# Patient Record
Sex: Male | Born: 1950 | Race: White | Hispanic: No | Smoking: Never smoker
Health system: Southern US, Community
[De-identification: ages and names within clinical notes are randomized; demographics above are authoritative.]

## PROBLEM LIST (undated history)

## (undated) DIAGNOSIS — E785 Hyperlipidemia, unspecified: Secondary | ICD-10-CM

## (undated) DIAGNOSIS — Z8701 Personal history of pneumonia (recurrent): Secondary | ICD-10-CM

## (undated) DIAGNOSIS — I2510 Atherosclerotic heart disease of native coronary artery without angina pectoris: Secondary | ICD-10-CM

## (undated) DIAGNOSIS — E119 Type 2 diabetes mellitus without complications: Secondary | ICD-10-CM

## (undated) DIAGNOSIS — G4730 Sleep apnea, unspecified: Secondary | ICD-10-CM

## (undated) DIAGNOSIS — I10 Essential (primary) hypertension: Secondary | ICD-10-CM

## (undated) DIAGNOSIS — I639 Cerebral infarction, unspecified: Secondary | ICD-10-CM

## (undated) DIAGNOSIS — M1990 Unspecified osteoarthritis, unspecified site: Secondary | ICD-10-CM

## (undated) DIAGNOSIS — F40240 Claustrophobia: Secondary | ICD-10-CM

## (undated) DIAGNOSIS — J449 Chronic obstructive pulmonary disease, unspecified: Secondary | ICD-10-CM

## (undated) DIAGNOSIS — F0390 Unspecified dementia without behavioral disturbance: Secondary | ICD-10-CM

## (undated) DIAGNOSIS — G629 Polyneuropathy, unspecified: Secondary | ICD-10-CM

## (undated) DIAGNOSIS — F419 Anxiety disorder, unspecified: Secondary | ICD-10-CM

## (undated) DIAGNOSIS — J189 Pneumonia, unspecified organism: Secondary | ICD-10-CM

## (undated) DIAGNOSIS — M419 Scoliosis, unspecified: Secondary | ICD-10-CM

## (undated) DIAGNOSIS — E7800 Pure hypercholesterolemia, unspecified: Secondary | ICD-10-CM

## (undated) DIAGNOSIS — F32A Depression, unspecified: Secondary | ICD-10-CM

## (undated) DIAGNOSIS — F329 Major depressive disorder, single episode, unspecified: Secondary | ICD-10-CM

## (undated) DIAGNOSIS — N184 Chronic kidney disease, stage 4 (severe): Secondary | ICD-10-CM

## (undated) DIAGNOSIS — K219 Gastro-esophageal reflux disease without esophagitis: Secondary | ICD-10-CM

## (undated) DIAGNOSIS — I739 Peripheral vascular disease, unspecified: Secondary | ICD-10-CM

## (undated) DIAGNOSIS — I219 Acute myocardial infarction, unspecified: Secondary | ICD-10-CM

## (undated) DIAGNOSIS — J42 Unspecified chronic bronchitis: Secondary | ICD-10-CM

## (undated) DIAGNOSIS — I509 Heart failure, unspecified: Secondary | ICD-10-CM

## (undated) DIAGNOSIS — G8929 Other chronic pain: Secondary | ICD-10-CM

## (undated) DIAGNOSIS — N183 Chronic kidney disease, stage 3 unspecified: Secondary | ICD-10-CM

## (undated) DIAGNOSIS — K429 Umbilical hernia without obstruction or gangrene: Secondary | ICD-10-CM

## (undated) DIAGNOSIS — G43909 Migraine, unspecified, not intractable, without status migrainosus: Secondary | ICD-10-CM

## (undated) DIAGNOSIS — M549 Dorsalgia, unspecified: Secondary | ICD-10-CM

## (undated) HISTORY — DX: Unspecified osteoarthritis, unspecified site: M19.90

## (undated) HISTORY — DX: Atherosclerotic heart disease of native coronary artery without angina pectoris: I25.10

## (undated) HISTORY — DX: Polyneuropathy, unspecified: G62.9

## (undated) HISTORY — DX: Gastro-esophageal reflux disease without esophagitis: K21.9

## (undated) HISTORY — DX: Major depressive disorder, single episode, unspecified: F32.9

## (undated) HISTORY — DX: Depression, unspecified: F32.A

## (undated) HISTORY — DX: Acute myocardial infarction, unspecified: I21.9

## (undated) HISTORY — PX: NEPHROSTOMY W/ INTRODUCTION OF CATHETER: SUR884

---

## 1994-10-23 DIAGNOSIS — I639 Cerebral infarction, unspecified: Secondary | ICD-10-CM

## 1994-10-23 HISTORY — DX: Cerebral infarction, unspecified: I63.9

## 2003-04-27 ENCOUNTER — Encounter: Payer: Self-pay | Admitting: Internal Medicine

## 2003-04-27 ENCOUNTER — Ambulatory Visit (HOSPITAL_COMMUNITY): Admission: RE | Admit: 2003-04-27 | Discharge: 2003-04-27 | Payer: Self-pay | Admitting: Internal Medicine

## 2004-05-18 ENCOUNTER — Inpatient Hospital Stay (HOSPITAL_COMMUNITY): Admission: EM | Admit: 2004-05-18 | Discharge: 2004-05-22 | Payer: Self-pay | Admitting: Emergency Medicine

## 2004-05-23 ENCOUNTER — Encounter: Admission: RE | Admit: 2004-05-23 | Discharge: 2004-05-23 | Payer: Self-pay | Admitting: Family Medicine

## 2004-06-01 ENCOUNTER — Encounter: Admission: RE | Admit: 2004-06-01 | Discharge: 2004-06-01 | Payer: Self-pay | Admitting: Sports Medicine

## 2004-06-03 ENCOUNTER — Encounter: Admission: RE | Admit: 2004-06-03 | Discharge: 2004-06-03 | Payer: Self-pay | Admitting: Family Medicine

## 2004-06-15 ENCOUNTER — Encounter: Admission: RE | Admit: 2004-06-15 | Discharge: 2004-06-15 | Payer: Self-pay | Admitting: Family Medicine

## 2004-07-07 ENCOUNTER — Ambulatory Visit: Payer: Self-pay | Admitting: Family Medicine

## 2004-10-11 ENCOUNTER — Ambulatory Visit: Payer: Self-pay | Admitting: Family Medicine

## 2004-11-09 ENCOUNTER — Ambulatory Visit: Payer: Self-pay | Admitting: Family Medicine

## 2004-12-29 ENCOUNTER — Ambulatory Visit: Payer: Self-pay | Admitting: Family Medicine

## 2005-03-17 ENCOUNTER — Ambulatory Visit: Payer: Self-pay | Admitting: Family Medicine

## 2005-04-22 HISTORY — PX: KNEE ARTHROSCOPY: SHX127

## 2005-04-23 ENCOUNTER — Emergency Department (HOSPITAL_COMMUNITY): Admission: EM | Admit: 2005-04-23 | Discharge: 2005-04-23 | Payer: Self-pay | Admitting: Emergency Medicine

## 2005-07-17 ENCOUNTER — Encounter: Admission: RE | Admit: 2005-07-17 | Discharge: 2005-07-17 | Payer: Self-pay | Admitting: Cardiology

## 2005-07-21 ENCOUNTER — Ambulatory Visit (HOSPITAL_COMMUNITY): Admission: RE | Admit: 2005-07-21 | Discharge: 2005-07-22 | Payer: Self-pay | Admitting: Cardiology

## 2005-07-23 HISTORY — PX: CORONARY ANGIOPLASTY WITH STENT PLACEMENT: SHX49

## 2005-07-25 ENCOUNTER — Inpatient Hospital Stay (HOSPITAL_COMMUNITY): Admission: EM | Admit: 2005-07-25 | Discharge: 2005-07-26 | Payer: Self-pay | Admitting: Emergency Medicine

## 2006-12-20 DIAGNOSIS — E119 Type 2 diabetes mellitus without complications: Secondary | ICD-10-CM

## 2006-12-20 DIAGNOSIS — R809 Proteinuria, unspecified: Secondary | ICD-10-CM | POA: Insufficient documentation

## 2006-12-20 DIAGNOSIS — G4730 Sleep apnea, unspecified: Secondary | ICD-10-CM

## 2006-12-20 DIAGNOSIS — E7800 Pure hypercholesterolemia, unspecified: Secondary | ICD-10-CM | POA: Insufficient documentation

## 2006-12-20 DIAGNOSIS — E669 Obesity, unspecified: Secondary | ICD-10-CM

## 2006-12-20 DIAGNOSIS — E118 Type 2 diabetes mellitus with unspecified complications: Secondary | ICD-10-CM

## 2006-12-20 DIAGNOSIS — E781 Pure hyperglyceridemia: Secondary | ICD-10-CM | POA: Insufficient documentation

## 2007-07-16 ENCOUNTER — Ambulatory Visit: Payer: Self-pay | Admitting: Family Medicine

## 2007-07-16 ENCOUNTER — Encounter: Payer: Self-pay | Admitting: Family Medicine

## 2007-07-16 DIAGNOSIS — I10 Essential (primary) hypertension: Secondary | ICD-10-CM

## 2007-07-16 DIAGNOSIS — M25569 Pain in unspecified knee: Secondary | ICD-10-CM

## 2007-07-18 ENCOUNTER — Ambulatory Visit: Payer: Self-pay | Admitting: Family Medicine

## 2007-07-18 ENCOUNTER — Telehealth (INDEPENDENT_AMBULATORY_CARE_PROVIDER_SITE_OTHER): Payer: Self-pay | Admitting: *Deleted

## 2007-07-18 ENCOUNTER — Encounter: Payer: Self-pay | Admitting: Family Medicine

## 2007-07-18 LAB — CONVERTED CEMR LAB
ALT: 22 units/L (ref 0–53)
AST: 17 units/L (ref 0–37)
Albumin: 4.3 g/dL (ref 3.5–5.2)
Alkaline Phosphatase: 88 units/L (ref 39–117)
BUN: 24 mg/dL — ABNORMAL HIGH (ref 6–23)
CO2: 26 meq/L (ref 19–32)
Calcium: 8.9 mg/dL (ref 8.4–10.5)
Chloride: 100 meq/L (ref 96–112)
Creatinine, Ser: 1.33 mg/dL (ref 0.40–1.50)
Direct LDL: 102 mg/dL — ABNORMAL HIGH
Glucose, Bld: 100 mg/dL — ABNORMAL HIGH (ref 70–99)
Hgb A1c MFr Bld: 7 %
Potassium: 3.9 meq/L (ref 3.5–5.3)
Sodium: 140 meq/L (ref 135–145)
Total Bilirubin: 0.4 mg/dL (ref 0.3–1.2)
Total Protein: 7.1 g/dL (ref 6.0–8.3)

## 2007-07-22 ENCOUNTER — Telehealth (INDEPENDENT_AMBULATORY_CARE_PROVIDER_SITE_OTHER): Payer: Self-pay | Admitting: *Deleted

## 2007-08-01 ENCOUNTER — Ambulatory Visit: Payer: Self-pay | Admitting: Family Medicine

## 2007-08-01 DIAGNOSIS — R609 Edema, unspecified: Secondary | ICD-10-CM | POA: Insufficient documentation

## 2007-08-01 DIAGNOSIS — F329 Major depressive disorder, single episode, unspecified: Secondary | ICD-10-CM | POA: Insufficient documentation

## 2007-08-06 ENCOUNTER — Ambulatory Visit: Payer: Self-pay | Admitting: Sports Medicine

## 2007-08-06 DIAGNOSIS — M23302 Other meniscus derangements, unspecified lateral meniscus, unspecified knee: Secondary | ICD-10-CM | POA: Insufficient documentation

## 2007-08-13 DIAGNOSIS — J069 Acute upper respiratory infection, unspecified: Secondary | ICD-10-CM | POA: Insufficient documentation

## 2007-08-19 ENCOUNTER — Ambulatory Visit: Payer: Self-pay | Admitting: Sports Medicine

## 2007-08-19 ENCOUNTER — Encounter (INDEPENDENT_AMBULATORY_CARE_PROVIDER_SITE_OTHER): Payer: Self-pay | Admitting: *Deleted

## 2007-08-19 DIAGNOSIS — Z9861 Coronary angioplasty status: Secondary | ICD-10-CM

## 2007-08-19 LAB — CONVERTED CEMR LAB
Hgb A1c MFr Bld: 7.2 %
Protein, U semiquant: 100

## 2007-08-20 ENCOUNTER — Encounter: Admission: RE | Admit: 2007-08-20 | Discharge: 2007-08-20 | Payer: Self-pay | Admitting: Sports Medicine

## 2007-09-03 ENCOUNTER — Ambulatory Visit: Payer: Self-pay | Admitting: Family Medicine

## 2007-09-03 ENCOUNTER — Encounter: Admission: RE | Admit: 2007-09-03 | Discharge: 2007-09-03 | Payer: Self-pay | Admitting: Sports Medicine

## 2007-09-03 DIAGNOSIS — M795 Residual foreign body in soft tissue: Secondary | ICD-10-CM

## 2007-11-15 ENCOUNTER — Ambulatory Visit: Payer: Self-pay | Admitting: Vascular Surgery

## 2008-04-25 ENCOUNTER — Inpatient Hospital Stay (HOSPITAL_COMMUNITY): Admission: EM | Admit: 2008-04-25 | Discharge: 2008-05-03 | Payer: Self-pay | Admitting: Emergency Medicine

## 2008-04-27 ENCOUNTER — Ambulatory Visit: Payer: Self-pay | Admitting: Vascular Surgery

## 2008-04-27 ENCOUNTER — Encounter (INDEPENDENT_AMBULATORY_CARE_PROVIDER_SITE_OTHER): Payer: Self-pay | Admitting: Internal Medicine

## 2008-04-29 ENCOUNTER — Encounter: Admission: RE | Admit: 2008-04-29 | Discharge: 2008-04-29 | Payer: Self-pay | Admitting: Internal Medicine

## 2009-01-21 ENCOUNTER — Encounter (INDEPENDENT_AMBULATORY_CARE_PROVIDER_SITE_OTHER): Payer: Self-pay | Admitting: Family Medicine

## 2010-06-08 ENCOUNTER — Encounter: Payer: Self-pay | Admitting: Internal Medicine

## 2010-07-19 ENCOUNTER — Encounter: Payer: Self-pay | Admitting: Internal Medicine

## 2010-08-04 ENCOUNTER — Ambulatory Visit: Payer: Self-pay | Admitting: Internal Medicine

## 2010-08-04 DIAGNOSIS — I2510 Atherosclerotic heart disease of native coronary artery without angina pectoris: Secondary | ICD-10-CM | POA: Insufficient documentation

## 2010-08-05 ENCOUNTER — Telehealth (INDEPENDENT_AMBULATORY_CARE_PROVIDER_SITE_OTHER): Payer: Self-pay | Admitting: *Deleted

## 2010-09-09 ENCOUNTER — Encounter: Payer: Self-pay | Admitting: Internal Medicine

## 2010-09-26 ENCOUNTER — Ambulatory Visit: Payer: Self-pay | Admitting: Internal Medicine

## 2010-10-10 ENCOUNTER — Telehealth: Payer: Self-pay | Admitting: Internal Medicine

## 2010-10-20 ENCOUNTER — Telehealth: Payer: Self-pay | Admitting: Internal Medicine

## 2010-10-20 DIAGNOSIS — R079 Chest pain, unspecified: Secondary | ICD-10-CM | POA: Insufficient documentation

## 2010-10-27 ENCOUNTER — Telehealth (INDEPENDENT_AMBULATORY_CARE_PROVIDER_SITE_OTHER): Payer: Self-pay | Admitting: *Deleted

## 2010-10-31 ENCOUNTER — Encounter (HOSPITAL_COMMUNITY): Admission: RE | Admit: 2010-10-31 | Payer: Self-pay | Source: Home / Self Care | Admitting: Internal Medicine

## 2010-11-04 ENCOUNTER — Telehealth (INDEPENDENT_AMBULATORY_CARE_PROVIDER_SITE_OTHER): Payer: Self-pay | Admitting: *Deleted

## 2010-11-07 ENCOUNTER — Encounter (HOSPITAL_COMMUNITY): Admission: RE | Admit: 2010-11-07 | Payer: Self-pay | Source: Home / Self Care | Admitting: Internal Medicine

## 2010-11-13 ENCOUNTER — Encounter: Payer: Self-pay | Admitting: Neurological Surgery

## 2010-11-16 ENCOUNTER — Ambulatory Visit: Admission: RE | Admit: 2010-11-16 | Discharge: 2010-11-16 | Payer: Self-pay | Source: Home / Self Care

## 2010-11-21 ENCOUNTER — Encounter (HOSPITAL_COMMUNITY)
Admission: RE | Admit: 2010-11-21 | Discharge: 2010-11-22 | Payer: Self-pay | Source: Home / Self Care | Attending: Internal Medicine | Admitting: Internal Medicine

## 2010-11-21 ENCOUNTER — Encounter: Payer: Self-pay | Admitting: Internal Medicine

## 2010-11-21 ENCOUNTER — Ambulatory Visit: Admission: RE | Admit: 2010-11-21 | Discharge: 2010-11-21 | Payer: Self-pay | Source: Home / Self Care

## 2010-11-21 ENCOUNTER — Encounter: Payer: Self-pay | Admitting: Cardiology

## 2010-11-21 ENCOUNTER — Encounter: Payer: Self-pay | Admitting: *Deleted

## 2010-11-23 ENCOUNTER — Telehealth (INDEPENDENT_AMBULATORY_CARE_PROVIDER_SITE_OTHER): Payer: Self-pay | Admitting: Radiology

## 2010-11-24 NOTE — Progress Notes (Signed)
Summary: Records request faxed  Release of information form faxed to:   [facility] Noberto Retort, MD   (p) [phone] [facility]   (p) [phone] [facility]   [phone] Sherri Sear  August 05, 2010 10:03 AM  Appended Document: Records request faxed [facility] faxed back [facility] with indication of "No records for this patient", voice mail left for patient to call back if this was not the correct facility.  Appended Document: Records request faxed Pt called back, He states he has the " Discharge Summary" from [facility].I called [facility] they close @ 12:30 on Friday's, will have to call back Monday  Appended Document: Records request faxed Records received from [facility] gave to [place]: Records request faxed another set of Records Received from [facility] gave to [place]: Records request faxed another set of records received from [facility] gave to [facility]

## 2010-11-24 NOTE — Letter (Signed)
Summary: [facility] Discharge/Hospital Course Notes  [facility] Discharge/Hospital Course Notes   Imported By: Sallee Provencal 08/29/2010 15:21:02  _____________________________________________________________________  External Attachment:    Type:   Image     Comment:   External Document

## 2010-11-24 NOTE — Assessment & Plan Note (Signed)
Summary: pt wants to discuss abnormal EKG/lg   Visit Type:  Follow-up Primary Provider:  Annye Asa  MD  CC:  chest pain same but more often.  History of Present Illness: patient is a 60 year old with a history of CAD.  He was previously seen by Drs. Little and kersey.  He is s/p PTCA/STent.  I saw him for the first time in October.  He had what sounded like atypical pain.  He had been admitted to [facility] prior.  A myoview was don that suggested ischemia. in the mid and distal anterolateral wall  We attempted to call the patient for a cardiac cath   He did not f/u with calls SInce seen he was reportedly admitted again to [facility] for ? LOC.  He was found in an intersection.  Speech not too coherent.  NO records Since seen he continues to have chest pains.  Does note reflux which is worse now that he is off nexium and on tagamet.  When he describes the CP it is worse at rest.  Not with activity.  Current Medications (verified): 1)  Metoprolol Tartrate 100 Mg  Tabs (Metoprolol Tartrate) .[name].. 1 Tab Once Daily 2)  Amaryl 4 Mg  Tabs (Glimepiride) .[name].. 1 Tab By Mouth Daily 3)  Pravastatin Sodium 40 Mg  Tabs (Pravastatin Sodium) .[name].. 1 Tab By Mouth At Bedtime 4)  Nortriptyline Hcl 25 Mg  Caps (Nortriptyline Hcl) .[name].. 1 Tab By Mouth At Bedtime 5)  Furosemide 40 Mg  Tabs (Furosemide) .[name].. 1 Tab By Mouth Daily-(Not Picked Up Yet) 6)  Lantus 100 Unit/ml  Soln (Insulin Glargine) .... 72- 80  Units Injected [place] Daily 7)  Aspirin 325 Mg  Tbec (Aspirin) .[name].. 1 Tab Two Times A Day 8)  Blood Glucose Meter and Test Strips .... Please Check Your Sugars Each Morning Before Breakfast and Each Evening Two Hours After Dinner.  Dispense Q.s. 30 Days 9)  Lisinopril 20 Mg Tabs (Lisinopril) .[name].. 1 Tab Two Times A Day 10)  Hydralazine Hcl 10 Mg Tabs (Hydralazine Hcl) .... Take 2 Tabs Daily 11)  Amlodipine Besylate 10 Mg Tabs (Amlodipine Besylate) .... Take One Tablet By Mouth Daily 12)   Wellbutrin Sr 200 Mg Xr12h-Tab (Bupropion Hcl) .[name].. 1  Tab Once Daily 13)  Citalopram Hydrobromide 20 Mg Tabs (Citalopram Hydrobromide) .... Take 1 Tablet By Mouth Once A Day 14)  Hydrocodone 7.5mg  .... Back Pain As Needed 15)  Zolpidem Tartrate 10 Mg Tabs (Zolpidem Tartrate) .[name].. 1 Tab Qhs 16)  Welchol 3.75 Gm Pack (Colesevelam Hcl) .... Once Daily 17)  Viibryd 40 Mg Tabs (Vilazodone Hcl) .... Take 1 Tablet By Mouth Once A Day 18)  Alprazolam 1 Mg Tabs (Alprazolam) .[name].. 1  Tab As Needed Daily 19)  Cimetidine 200 Mg Tabs (Cimetidine) .... 4 Tabs Before and 4 Tabs After Eating Something That Causes Reflux 20)  Diphenhydramine Hcl 50 Mg Tabs (Diphenhydramine Hcl) .... At Bedtime As Needed (Rare) 21)  Trazodone Hcl 100 Mg Tabs (Trazodone Hcl) .... At Bedtime 22)  Novolog Mix 70/30 Flexpen 70-30 % Susp (Insulin Aspart Prot & Aspart) .... As Directed  Allergies (verified): 1)  ! Codeine  Past History:  Past medical, surgical, family and social histories (including risk factors) reviewed, and no changes noted (except as noted below).  Past Medical History: Reviewed history from 08/04/2010 and no changes required. 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 Annamaria Boots for OSA CAD   Hx stent. HTN Neuropathy DJD GERD Depression  Family History: Reviewed history from 12/20/2006 and no changes required. Brother - deceased at 27 w/ leukemia, Father - deceased at 30 w/ MI, Mother - deceased at 59 w/ renal CA  Social History: Reviewed history from 07/16/2007 and no changes required. Pt lives by himself and is divorced.  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  No smoking, no EtOH.  Pt has 2 brothers.  Vital Signs:  Patient profile:   60 year old male Height:      73 inches Weight:      284 pounds BMI:      37.60 Pulse rate:   95 / minute BP sitting:   128 / 86  (left arm) Cuff size:   regular  Vitals Entered By: Mignon Pine, RMA (September 26, 2010 3:00 PM)  Physical Exam  Additional Exam:  Patinet is in NAD HEENT:  Normocephalic, atraumatic. EOMI, PERRLA.  Neck: JVP is normal. No thyromegaly. No bruits.  Lungs: clear to auscultation. No rales no wheezes.  Heart: Regular rate and rhythm. Normal S1, S2. No S3.   No significant murmurs. PMI not displaced.  Abdomen:  Supple, nontender. Normal bowel sounds. No masses. No hepatomegaly.  Extremities:   Good distal pulses throughout. No lower extremity edema.  Musculoskeletal :moving all extremities.  Neuro:   alert and oriented x3.    Impression & Recommendations:  Problem # 1:  CAD (ICD-414.00) Pain spells are atypical.  Occur more at rest and not with physical acitivities.  Does have a history of CAD.  Myoview earlier this fall at [facility] suggested possible ischemia.   For now I would not plan evaluation until obtain records on recent hospitatlization for MS changes.  Keep on same regimen.  Problem # 2:  HYPERTENSION, BENIGN ESSENTIAL (ICD-401.1) Keep on current regimen  Problem # 3:  HYPERCHOLESTEROLEMIA (ICD-272.0) Keep on meds. His updated medication list for this problem includes:    Pravastatin Sodium 40 Mg Tabs (Pravastatin sodium) .[name]... 1 tab by mouth at bedtime    Welchol 3.75 Gm Pack (Colesevelam hcl) ..... Once daily  Patient Instructions: 1)  Dr.Ross will call you after she gets your records from [facility] Prescriptions: OMEPRAZOLE 40 MG CPDR (OMEPRAZOLE) 1 tab two times per day  #60 x 6   Entered by:   Francetta Found, RN, BSN   Authorized by:   Annye Rusk, MD, [facility]   Signed by:   Francetta Found, RN, BSN on 09/26/2010   Method used:   Electronically to        [facility] (retail)       [address]       [place], [place]  [address]        Ph: [phone] or [phone]       Fax: [phone]   RxID:   [id number] NITROSTAT 0.4 MG SUBL (NITROGLYCERIN) take as directed  #25 x 6   Entered by:   Francetta Found, RN, BSN   Authorized by:   Annye Rusk, MD, [facility]   Signed by:   Francetta Found, RN, BSN on 09/26/2010   Method used:   Electronically to        [facility]  [place] (retail)       [address]       [place], [place]  [address]       Ph: [phone] or [phone]       Fax: [phone]   RxID:   [id number]   Appended Document: pt wants to discuss abnormal EKG/lg patient does note that reflux is worse since coming off Nexium  Will resume two times a day.

## 2010-11-24 NOTE — Letter (Signed)
Summary: [facility]   Imported By: Marilynne Drivers 11/01/2010 09:40:22  _____________________________________________________________________  External Attachment:    Type:   Image     Comment:   External Document

## 2010-11-24 NOTE — Progress Notes (Signed)
Summary: status of cath  Phone Note Call from Patient Call back at Home Phone [phone]   Caller: Patient Reason for Call: Talk to Nurse Summary of Call: status of cath.  Initial call taken by: Neil Crouch,  October 10, 2010 1:34 PM  Follow-up for Phone Call        Phone Call Completed PER  PT STATES THAT DR ROSS WANTED TO DO ANOTHER CATH ONCE RECORDS RECIEVED FROM [place].WILL DISCUSS WITH DR Harrington Challenger AND CALL PT BACK Follow-up by: Devra Dopp, LPN,  December 19, 624THL 1:55 PM  Additional Follow-up for Phone Call Additional follow up Details #1::        PT AWARE  RECORDS NOT REVIEWED BY DR ROSS AT [place]. Additional Follow-up by: Devra Dopp, LPN,  December 19, 624THL 3:42 PM    Additional Follow-up for Phone Call Additional follow up Details #2::    I have reviewed records from [facility].   Renal function was signif decreased on normal.  Still not normal on d/c.  I am reluctant to proceed with cath given this.  I would continue medical Rx.   If continues to have symptoms (which have not  be typical) would need a repeat myovie. prior to demonstrate signif ischemia (other scan was done at [facility], report only) Follow-up by: Annye Rusk, MD, [facility],  October 13, 2010 2:09 PM   Appended Document: status of cath Pain he has described is worse at rest.  Does have a hx of GERD  Appended Document: status of cath [facility] for call back.  Appended Document: status of cath Patient aware of above information. He is currently out of town in [place] visiting family for the holidays. He will call me back when he returns home to set up stress myoview test.

## 2010-11-24 NOTE — Progress Notes (Signed)
Summary: Nuclear Pre-Procedure  Phone Note Outgoing Call Call back at [facility] Phone [phone]   Call placed by: Eliezer Lofts, EMT-P,  November 04, 2010 3:27 PM Action Taken: Phone Call Completed Summary of Call: Left message with information on Myoview Information Sheet (see scanned document for details). Eliezer Lofts, EMT-P  November 04, 2010 3:27 PM     Nuclear Med Background Indications for Stress Test: Evaluation for Ischemia, Stent Patency, PTCA Patency, Abnormal EKG   History: Angioplasty, Heart Catheterization, Myocardial Infarction, Myocardial Perfusion Study, Stents  History Comments: 1980 MI '06 Angioplasty--Stents:OM 07/09 Heart Cath N/O CAD EF 60-65% TX MED 09/27/11[facility]PS ([facility]) EF 49%  Symptoms: Chest Pain    Nuclear Pre-Procedure Cardiac Risk Factors: CVA, Family History - CAD, Hypertension, IDDM Type 2, Lipids, Obesity Height (in): 26  Nuclear Med Study Referring MD:  P.Ross

## 2010-11-24 NOTE — Progress Notes (Signed)
Summary: Nuclear Pre-Procedure  Phone Note Outgoing Call   Call placed by: Perrin Maltese, EMT-P,  October 27, 2010 1:38 PM Summary of Call: Left message with information on Myoview Information Sheet (see scanned document for details).      Nuclear Med Background Indications for Stress Test: Evaluation for Ischemia, Stent Patency, PTCA Patency, Abnormal EKG   History: Angioplasty, Heart Catheterization, Myocardial Infarction, Myocardial Perfusion Study, Stents  History Comments: 1980 MI '06 Angioplasty--Stents:OM 07/09 Heart Cath N/O CAD EF 60-65% TX MED 09/27/11MPS ([facility]) EF 49%  Symptoms: Chest Pain    Nuclear Pre-Procedure Cardiac Risk Factors: CVA, Family History - CAD, Hypertension, IDDM Type 2, Lipids, Obesity Height (in): 33  Nuclear Med Study Referring MD:  P.Ross

## 2010-11-24 NOTE — Letter (Signed)
Summary: [place] Office Visit Note   [place] Office Visit Note   Imported By: Sallee Provencal 09/13/2010 14:13:04  _____________________________________________________________________  External Attachment:    Type:   Image     Comment:   External Document

## 2010-11-24 NOTE — Progress Notes (Signed)
Summary: calling about a stress test  Phone Note Call from Patient Call back at Home Phone [phone]   Caller: Patient Summary of Call: Pt calling regarding a stress test Initial call taken by: Delsa Sale,  October 20, 2010 4:11 PM  Follow-up for Phone Call        Called patient....he is back in town and would like to set up nuc stress test. Will send order to [facility].  Francetta Found, RN, BSN  October 20, 2010 4:18 PM   New Problems: CHEST PAIN UNSPECIFIED (ICD-786.50)   New Problems: CHEST PAIN UNSPECIFIED (ICD-786.50)

## 2010-11-24 NOTE — Letter (Signed)
Summary: [facility] [place]   Imported By: Sallee Provencal 08/25/2010 11:21:04  _____________________________________________________________________  External Attachment:    Type:   Image     Comment:   External Document

## 2010-11-24 NOTE — Assessment & Plan Note (Signed)
Summary: stent placement/cardiac eval/mt   Visit Type:  Follow-up Primary Provider:  Annye Asa  MD   History of Present Illness: patient is a 60 year old with a history of CAD.  He was previously seen by Drs. Little and kersey.  He is s/p PTCA/STent  He was recently admitted to [facility]. No records of this admission.  He does reprot having a nuclear stress test that was abnormal.  Disussion occurred for cardiac catheterization. The patent notes episodes of chest pain.  He said it is like a long pin stabbling him.  Occur with and without activity.  L parasternal area. Activity is limited by sore/pain in leg.  Current Medications (verified): 1)  Metoprolol Tartrate 100 Mg  Tabs (Metoprolol Tartrate) .[name].. 1 Tab Once Daily 2)  Amaryl 4 Mg  Tabs (Glimepiride) .[name].. 1 Tab By Mouth Daily 3)  Pravastatin Sodium 40 Mg  Tabs (Pravastatin Sodium) .[name].. 1 Tab By Mouth At Bedtime 4)  Nortriptyline Hcl 25 Mg  Caps (Nortriptyline Hcl) .[name].. 1 Tab By Mouth At Bedtime 5)  Furosemide 40 Mg  Tabs (Furosemide) .[name].. 1 Tab By Mouth Daily-(Not Picked Up Yet) 6)  Lantus 100 Unit/ml  Soln (Insulin Glargine) .... 72- 80  Units Injected [place] Daily 7)  Aspirin 325 Mg  Tbec (Aspirin) .[name].. 1 Tab Two Times A Day 8)  Blood Glucose Meter and Test Strips .... Please Check Your Sugars Each Morning Before Breakfast and Each Evening Two Hours After Dinner.  Dispense Q.s. 30 Days 9)  Lisinopril 20 Mg Tabs (Lisinopril) .[name].. 1 Tab Two Times A Day 10)  Hydrochlorothiazide 50 Mg Tabs (Hydrochlorothiazide) .... Take One Tablet By Mouth Daily. 11)  Hydralazine Hcl 10 Mg Tabs (Hydralazine Hcl) .... Take 2 Tabs Daily 12)  Amlodipine Besylate 10 Mg Tabs (Amlodipine Besylate) .... Take One Tablet By Mouth Daily 13)  Wellbutrin Sr 200 Mg Xr12h-Tab (Bupropion Hcl) .[name].. 1  Tab Once Daily 14)  Doxepin Hcl 75 Mg Caps (Doxepin Hcl) .[name].. 1 Capsule At Bed Time 15)  Citalopram Hydrobromide 40 Mg Tabs (Citalopram Hydrobromide) .[name].. 1  Tab  Qd 16)  Hydrocodone 7.5mg  .... Back Pain As Needed 17)  Hydralazine Hcl 10 Mg Tabs (Hydralazine Hcl) .... Take Two Tablet By Mouth Three Times A Day 18)  Flexeril 10 Mg Tabs (Cyclobenzaprine Hcl) .[name].. 1 Tab Three Times A Day \\par  19)  Zolpidem Tartrate 10 Mg Tabs (Zolpidem Tartrate) .[name].. 1 Tab Qhs 20)  Mediproxen 220 Mg Tabs (Naproxen Sodium) .[name].. 1 -2 Tabs Night 21)  Lovaza 1 Gm Caps (Omega-3-Acid Ethyl Esters) .... 2 Tab Two Times A Day 22)  Viibryd 40 Mg Tabs (Vilazodone Hcl) .... Take As Dircted ( Anti Depression).pt Had Samples 23)  Alprazolam 1 Mg Tabs (Alprazolam) .[name].. 1  Tab As Needed Daily 24)  Ranitidine Hcl 150 Mg Caps (Ranitidine Hcl) .[name].. 1 Tab Two Times A Day As Needed  Allergies (verified): 1)  ! Codeine  Past History:  Family History: Last updated: 19-Jan-2007 Brother - deceased at 43 w/ leukemia, Father - deceased at 40 w/ MI, Mother - deceased at 50 w/ renal CA  Social History: Last updated: 07/16/2007 Pt lives by himself and is divorced.  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  No smoking, no EtOH.  Pt has 2 brothers.  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 Annamaria Boots for OSA CAD   Hx stent. HTN Neuropathy DJD GERD Depression  Review of Systems       Patient in legs.  Neuroapthy.  disabled.  Back paitns.    SOB.  Palpitations.  Fatigue.  Reflux.  Otherwise all systems reviewed.  Neg to the above prolbmem except as noted above.  Vital Signs:  Patient profile:   60 year old male Height:      73 inches Weight:      292 pounds BMI:     38.66 Pulse rate:   120 / minute BP sitting:   154 / 100  (left arm) Cuff size:   large  Vitals Entered By: Lubertha Basque, CNA (August 04, 2010 3:40 PM)  Physical Exam  Additional Exam:  Patient is in  NAD HEENT:  Normocephalic, atraumatic. EOMI, PERRLA.  Neck: JVP is normal. No thyromegaly. No bruits.  Lungs: clear to auscultation. No rales no wheezes.  Heart: Regular rate and rhythm. Normal S1, S2. No S3.   No significant murmurs. PMI not displaced.  Abdomen:  Supple, nontender. Normal bowel sounds. No masses. No hepatomegaly.  Extremities:   Good distal pulses throughout.  Sore along L shin.  Tr edema. Musculoskeletal :moving all extremities.  Neuro:   alert and oriented x3.  Otherwise deferred.   EKG  Procedure date:  08/04/2010  Findings:      Sinus tachycardia.  120 bpm.  RBBB  Impression & Recommendations:  Problem # 1:  CAD (ICD-414.00) Patient with atypical symptoms.  But myoview is reported positive.  I would like to get old records from [facility] as well as from [facility] before scheduling other tests.  Keep on same regimen for now.  Problem # 2:  HYPERTENSION, BENIGN ESSENTIAL (ICD-401.1) Follow.  Keep on regimen.  Problem # 3:  HYPERCHOLESTEROLEMIA (ICD-272.0) Need to review outside records.  No change for now. His updated medication list for this problem includes:    Pravastatin Sodium 40 Mg Tabs (Pravastatin sodium) .[name]... 1 tab by mouth at bedtime    Lovaza 1 Gm Caps (Omega-3-acid ethyl esters) .[name]... 2 tab two times a day  Other Orders: EKG w/ Interpretation (93000)  Patient Instructions: 1)  Continue current meds.  Will get old records to review before further testing.  Appended Document: stent placement/cardiac eval/mt patient seen by A. Little in past.  IN 2006 had nonDES to OM.  LAD had moderate disease.   Records from [place] [facility] showed mild reversibliltiy in the mid and apical anterolateral wall.  LVE 49%. Based on this if continues to have pain would recommend cath to redefine anatomy.  Appended Document: stent placement/cardiac eval/mt LMOM for call back.  Appended Document: stent placement/cardiac eval/mt LMOM for call back.

## 2010-11-30 NOTE — Progress Notes (Signed)
Summary: Needs Myoview results  Phone Note Call from Patient Call back at Home Phone [phone]   Caller: Patient Call For: Dr. Harrington Challenger Summary of Call: Pt called wanting Myoview test results.   He said it is best to call him after 12:00PM. Initial call taken by: Evalina Field, RT-N,  November 23, 2010 4:02 PM     Appended Document: Needs Myoview results Called patient with nuc study results.

## 2010-11-30 NOTE — Assessment & Plan Note (Signed)
Summary: Cardiology Nuclear Testing  Nuclear Med Background Indications for Stress Test: Evaluation for Ischemia, Stent Patency, PTCA Patency, Abnormal EKG   History: Angioplasty, Heart Catheterization, Myocardial Infarction, Myocardial Perfusion Study, Stents  History Comments: '80 MI; '06 PTCA/Stent-OM; '09 Cath:n/o CAD, EF=60-65%, medical tx.; 9/27/11MPS:mild anterior ischemia, EF=49%  Symptoms: Chest Pain, Chest Pressure, Diaphoresis, DOE, Near Syncope, SOB  Symptoms Comments: Last episode of [id number] has h/o CVA and has "trouble with memory".   Nuclear Pre-Procedure Cardiac Risk Factors: CVA, Family History - CAD, Hypertension, IDDM Type 2, Lipids, Obesity, RBBB Caffeine/Decaff Intake: none NPO After: 9:00 PM Lungs: Diminished with slight expiratory wheezes.  O2 Sat w/o deep breath 88% on RA and with deep breath 98%. IV 0.9% NS with Angio Cath: 20g     IV Site: R Hand IV Started by: Matilde Haymaker, RN Chest Size (in) 46     Height (in): 73 Weight (lb): 283 BMI: 37.47 Tech Comments: Metoprolol held x 36 hours.  Patient did not check BS at home, did not take Amaryl or Glipizide.  BS 237 at 1315.  Nuclear Med Study 1 or 2 day study:  1 day     Stress Test Type:  Carlton Adam Reading MD:  Darlin Coco, MD     Referring MD:  Dorris Carnes, MD Resting Radionuclide:  Technetium 53m Tetrofosmin     Resting Radionuclide Dose:  33 mCi  Stress Radionuclide:  Technetium 65m Tetrofosmin     Stress Radionuclide Dose:  33 mCi   Stress Protocol      Max HR:  113 bpm     Predicted Max HR:  162 bpm       Percent Max HR:  69.75 % Lexiscan: 0.4 mg   Stress Test Technologist:  Valetta Fuller, CMA-N     Nuclear Technologist:  Annye Rusk, CNMT  Rest Procedure  Myocardial perfusion imaging was performed at rest 45 minutes following the intravenous administration of Technetium 58m Tetrofosmin.  Stress Procedure  The patient received IV Lexiscan 0.4 mg over 15-seconds.  Technetium 31m  Tetrofosmin injected at 30-seconds.  There were no significant changes with infusion.  Quantitative spect images were obtained after a 45 minute delay.  QPS Raw Data Images:  Normal; no motion artifact; normal heart/lung ratio. Stress Images:  Normal homogeneous uptake in all areas of the myocardium. Rest Images:  Normal homogeneous uptake in all areas of the myocardium. Subtraction (SDS):  No evidence of ischemia. Transient Ischemic Dilatation:  .91  (Normal <1.22)  Lung/Heart Ratio:  .36  (Normal <0.45)  Quantitative Gated Spect Images QGS EDV:  121 ml QGS ESV:  47 ml QGS EF:  61 % QGS cine images:  No wall motion abnormalities.  Findings Normal nuclear study      Overall Impression  Exercise Capacity: Lexiscan with no exercise. BP Response: Normal blood pressure response. Clinical Symptoms: No chest pain ECG Impression: No significant ST segment change suggestive of ischemia. Overall Impression: Normal stress nuclear study.  Appended Document: Cardiology Nuclear Testing Normal myoview.  No evidence of ischemia.  CP does not appear to be due to poor blood flow in heart.  Appended Document: Cardiology Nuclear Testing Called patient with results.

## 2011-01-30 ENCOUNTER — Telehealth: Payer: Self-pay | Admitting: Internal Medicine

## 2011-01-30 NOTE — Telephone Encounter (Signed)
Called patient back. He is complaining of SOB when lying flat and swelling in ankles and feet for 2 weeks. I offered him an appointment this week in the [place] office but he declined. He would like to be seen in [place]. Dr.Ross is out of the office this week so I made an appointment for him to see Dr. Burt Knack on 4/12. Patient was instructed to proceed to the nearest emergency room if SOB becomes worse.He verbalized understanding.

## 2011-02-01 ENCOUNTER — Encounter: Payer: Self-pay | Admitting: Cardiovascular Disease

## 2011-02-02 ENCOUNTER — Ambulatory Visit: Payer: Self-pay | Admitting: Cardiovascular Disease

## 2011-03-02 ENCOUNTER — Ambulatory Visit (INDEPENDENT_AMBULATORY_CARE_PROVIDER_SITE_OTHER): Payer: PRIVATE HEALTH INSURANCE | Admitting: Internal Medicine

## 2011-03-02 ENCOUNTER — Telehealth: Payer: Self-pay | Admitting: Internal Medicine

## 2011-03-02 ENCOUNTER — Encounter: Payer: Self-pay | Admitting: Internal Medicine

## 2011-03-02 ENCOUNTER — Ambulatory Visit: Payer: Self-pay | Admitting: Internal Medicine

## 2011-03-02 DIAGNOSIS — E7800 Pure hypercholesterolemia, unspecified: Secondary | ICD-10-CM

## 2011-03-02 DIAGNOSIS — I119 Hypertensive heart disease without heart failure: Secondary | ICD-10-CM

## 2011-03-02 DIAGNOSIS — R0989 Other specified symptoms and signs involving the circulatory and respiratory systems: Secondary | ICD-10-CM

## 2011-03-02 DIAGNOSIS — E781 Pure hyperglyceridemia: Secondary | ICD-10-CM

## 2011-03-02 DIAGNOSIS — R0609 Other forms of dyspnea: Secondary | ICD-10-CM

## 2011-03-02 DIAGNOSIS — I2510 Atherosclerotic heart disease of native coronary artery without angina pectoris: Secondary | ICD-10-CM

## 2011-03-02 LAB — BASIC METABOLIC PANEL
BUN: 25 mg/dL — ABNORMAL HIGH (ref 6–23)
CO2: 31 mEq/L (ref 19–32)
Chloride: 100 mEq/L (ref 96–112)
Creatinine, Ser: 2.5 mg/dL — ABNORMAL HIGH (ref 0.4–1.5)

## 2011-03-02 NOTE — Assessment & Plan Note (Signed)
Keep on medical Rx.  Follows with primary.  WIll need to get labs.

## 2011-03-02 NOTE — Assessment & Plan Note (Signed)
Patient with reported CAD and stent.  I never have seen records.  Myoview in December is normal. I would get labs today.  Will recomm he consolidate his lasix to 60 mg in am for better response.

## 2011-03-02 NOTE — Patient Instructions (Addendum)
Lab work today. We will call you with results  Increase  Lasix 60mg  every day in the AM.  Your physician recommends that you schedule a follow-up appointment in: 8 months with Dr. Harrington Challenger

## 2011-03-02 NOTE — Assessment & Plan Note (Addendum)
BP is good.  Continue meds

## 2011-03-02 NOTE — Assessment & Plan Note (Signed)
I am not convinced of any active angina.  I would not change regimen.

## 2011-03-02 NOTE — Progress Notes (Signed)
HPIpatient is a 60 year old with a history of CAD.  He was previously seen by Drs. Little and kersey.  He is s/p PTCA/STent.  I saw him for the first time in October.  He had what sounded like atypical pain.  He had been admitted to [facility] prior.  A myoview was don that suggested ischemia. in the mid and distal anterolateral wall  We attempted to call the patient for a cardiac cath   He did not f/u with calls I saw him in Dec.  With continued symptoms and ?syncope I sched a myoview.  This was normal. SInce seen he continues to have intermittent chest pains.  He said the worst was last night  Sharp, stabbing.  Lasted 10 seconds. Complains of LE swelling.  Lasix recnetly increased  Taking 40 AM, 20 PM  Does not notice much change in how he is urinating.  Denies salt intake. Otherwise has diffuse pains due to arthritis.   Notes occasional gurgling sounds when he wakes up Allergies  Allergen Reactions  . Codeine     REACTION: Nausea    Current Outpatient Prescriptions  Medication Sig Dispense Refill  . ALPRAZolam (XANAX) 1 MG tablet Take 1 mg by mouth at bedtime as needed.        [name] amLODipine (NORVASC) 10 MG tablet Take 10 mg by mouth daily.        [name] aspirin 325 MG tablet Take 325 mg by mouth daily.        . budesonide-formoterol (SYMBICORT) 160-4.5 MCG/ACT inhaler Inhale 2 puffs into the lungs 2 (two) times daily.        [name] buPROPion (WELLBUTRIN SR) 200 MG 12 hr tablet Take 200 mg by mouth daily.        . citalopram (CELEXA) 20 MG tablet Take 20 mg by mouth daily.        . DiphenhydrAMINE HCl, Sleep, 50 MG tablet Take 50 mg by mouth at bedtime as needed.        [name] esomeprazole (NEXIUM) 40 MG capsule Take 40 mg by mouth daily before breakfast.        . fenofibrate micronized (LOFIBRA) 134 MG capsule Take 134 mg by mouth daily before breakfast.        . furosemide (LASIX) 20 MG tablet Take 20 mg by mouth. Take 2 in AM and 1 tablet PM       . gabapentin (NEURONTIN) 300 MG capsule Take 300  mg by mouth at bedtime.        [name] glimepiride (AMARYL) 4 MG tablet Take 4 mg by mouth daily before breakfast.        . hydrALAZINE (APRESOLINE) 10 MG tablet Take 10 mg by mouth. take 2 tablets three times a day      . HYDROcodone-acetaminophen (NORCO) 7.5-325 MG per tablet Take 1 tablet by mouth every 6 (six) hours as needed.        . insulin aspart protamine-insulin aspart (NOVOLOG 70/30) (70-30) 100 UNIT/ML injection Inject into the skin. As directed       . insulin glargine (LANTUS) 100 UNIT/ML injection Inject into the skin at bedtime. 72-80 units injected Browning daily       . lisinopril (PRINIVIL,ZESTRIL) 20 MG tablet Take 20 mg by mouth 2 (two) times daily.        [name] lubiprostone (AMITIZA) 8 MCG capsule Take 8 mcg by mouth 2 (two) times daily with a meal.        .  metoprolol (LOPRESSOR) 100 MG tablet Take 100 mg by mouth daily.        [name] NIFEdipine (PROCARDIA-XL) 60 MG (OSM) 24 hr tablet Take 60 mg by mouth daily.        . nitroGLYCERIN (NITROSTAT) 0.4 MG SL tablet Place 0.4 mg under the tongue every 5 (five) minutes as needed.        . nortriptyline (PAMELOR) 25 MG capsule Take 25 mg by mouth at bedtime.        . Omega-3 Fatty Acids (FISH OIL) 1000 MG CAPS Take 1 capsule by mouth at bedtime.        . pravastatin (PRAVACHOL) 40 MG tablet Take 40 mg by mouth daily.        [name] zolpidem (AMBIEN) 10 MG tablet Take 10 mg by mouth at bedtime as needed.        [name] DISCONTD: omeprazole (PRILOSEC) 40 MG capsule Take 40 mg by mouth 2 (two) times daily.        [name] DISCONTD: cimetidine (TAGAMET) 200 MG tablet Take by mouth. Take 4 tablets before and 4 tablets after eating something that causes reflux       . DISCONTD: Colesevelam HCl (WELCHOL) 3.75 G PACK Take by mouth. Once a day       . DISCONTD: furosemide (LASIX) 40 MG tablet Take 40 mg by mouth daily.        [name] DISCONTD: traZODone (DESYREL) 100 MG tablet Take 100 mg by mouth at bedtime.        [name] DISCONTD: Vilazodone HCl (VIIBRYD) 40 MG TABS Take 1 tablet by  mouth daily.          Past Medical History  Diagnosis Date  . DKA (diabetic ketoacidoses)     admission on 04/2004  . Hypertensive emergency   . Diabetes mellitus     type II. Diagnosed in 1999  . CVA (cerebral infarction) 1996    hx of  w/some residual L arm weakness  . Myocardial infarct 1980  . Coronary artery disease     hx of stent  . Hypertension   . Neuropathy   . DJD (degenerative joint disease)   . GERD (gastroesophageal reflux disease)   . Depression     Past Surgical History  Procedure Date  . Coronary angioplasty with stent placement   . Coronary angioplasty with stent placement     Family History  Problem Relation Age of Onset  . Heart attack Father 19    deceased  . Leukemia Brother 61    deceased  . Kidney cancer Mother 13    deceased    History   Social History  . Marital Status: Divorced    Spouse Name: N/A    Number of Children: N/A  . Years of Education: N/A   Occupational History  . truck driver    Social History Main Topics  . Smoking status: Never Smoker   . Smokeless tobacco: Not on file  . Alcohol Use: No  . Drug Use: No  . Sexually Active: Not on file   Other Topics Concern  . Not on file   Social History Narrative  . No narrative on file    Review of Systems:  All systems reviewed.  They are negative to the above problem except as previously stated.  Vital Signs: BP 122/70  Pulse 84  Ht 6\' 1"  (1.854 m)  Wt 292 lb 12.8 oz (132.813 kg)  BMI 38.63 kg/m2  SpO2 93%  Physical  Exam  Patient is in NAD  HEENT:  Normocephalic, atraumatic. EOMI, PERRLA.  Neck: JVP is normal. No thyromegaly. No bruits.  Lungs: clear to auscultation. No rales no wheezes.  Heart: Regular rate and rhythm. Normal S1, S2. No S3.   No significant murmurs. PMI not displaced.  Abdomen:  Supple, nontender. Normal bowel sounds. No masses. No hepatomegaly.  Extremities:   Good distal pulses throughout. Tr to 1+ lower extremity edema.    Musculoskeletal :moving all extremities.  Neuro:   alert and oriented x3.  CN II-XII grossly intact.  EKG:  SR.  84 bpm   Assessment and Plan:

## 2011-03-02 NOTE — Assessment & Plan Note (Signed)
Will need to review last lab date.  No change for now.

## 2011-03-07 NOTE — Consult Note (Signed)
[name], [name] NO.:  [id number]   MEDICAL RECORD NO.:  [id number]          PATIENT TYPE:  INP   LOCATION:  [id number]                         FACILITY:  [facility]   PHYSICIAN:  Felizardo Hoffmann, M.D.  DATE OF BIRTH:  [birth year]   DATE OF CONSULTATION:  04/28/2008  DATE OF DISCHARGE:                                 CONSULTATION   REQUESTING PHYSICIAN:  [facility].   REASON FOR CONSULTATION:  Depression.   HISTORY OF PRESENT ILLNESS:  [name] is a 60 year old male  admitted to the [facility] on the 4 of July 2009 for syncope.   Reginald Duncan has developed approximately 4 weeks of progressive depressed  mood, decreased energy, reduced interest, difficulty concentrating and  thoughts of hopelessness and helplessness.  He does not have any  suicidal thoughts.  He has no hallucinations or delusions.  His  orientation and memory are intact.  He is cooperative with care.  He has  severe back pain.   The patient is still grieving from the death of a number of friends and  family since 2003.  He finds himself consumed by thoughts of who might  die next that he knows.  This worries him tremendously.   PAST PSYCHIATRIC HISTORY:  The patient does have a history of previous  depression.  In review of the past medical records, he was on Lexapro 5  mg b.i.d. as far back as September of 2006.  Reginald Duncan has been  restarted on Lexapro 10 mg daily for over 4 weeks with no improvement.   FAMILY PSYCHIATRIC HISTORY:  None known.   SOCIAL HISTORY:  The patient is a medically retired Administrator.  He  has 2 brothers.  He is divorced.  He has no children.  He is on  disability.  He does not use al chol or illegal drugs.   PAST MEDICAL HISTORY:  1. Obstructive sleep apnea.  2. Coronary artery disease.  3. Diabetes mellitus.  4. Hypertension.  5. Obesity.   ALLERGIES:  CODEINE.   MEDICATIONS:  The MAR is reviewed.  The patient is on:  1. Lexapro 20 mg  daily.  2. Pamelor 25 mg nightly.  3. Valium 5 mg every 4 hours p.r.n.  4. Ambien 10 mg nightly p.r.n.   LABORATORY DATA:  Sodium 141, BUN 20, creatinine 1.38, glucose 110.  WBC  5.1, hemoglobin 12.7, platelet count 170.  INR within normal limits.  TSH within normal limits.  Head CT without contrast showed mild general  atrophy.   REVIEW OF SYSTEMS:  Constitutional, Head, Eyes, Ears, Nose, Throat,  Mouth, Neurologic, Psychiatric, Cardiovascular, Respiratory,  Gastrointestinal, Genitourinary, Skin, Musculoskeletal, Hematological,  Lymphatic, Endocrine, Metabolic, all unremarkable.   EXAMINATION:  VITAL SIGNS:  Temperature 98.6, pulse 73, respiratory rate  18, blood pressure 115/79.  O2 saturation on room air 95%.  GENERAL APPEARANCE:  Reginald Duncan is a middle-aged male sitting up in his  hospital bed wincing from back pain.  He is alert.  His eye contact is  good.  His attention span is normal.  His concentration is mildly  decreased.  He is oriented to all spheres.  His mood is depressed.  His  affect is constricted with tears.  His memory is intact to immediate,  recent and remote.  His fund of knowledge and intelligence are within  normal limits.   Speech involves normal rate and prosody without dysarthria.  Thought  process logical, coherent, goal directed.  No looseness of associations  by content.  No thoughts of harming himself, no thoughts of harming  others, no delusions, no hallucinations.  Insight is intact.  Judgment  is intact.   ASSESSMENT:  Axis I:  293.83 Mood disorder not otherwise specified,  depressed (idiopathic and general medical factors).  293.84 Anxiety  disorder not otherwise specified.  The patient does have some  characteristics of generalized anxiety disorder.  296.33 Major  depressive disorder, recurrent, severe.  Axis II:  None.  Axis III:  See past medical history.  Axis IV:  General medical, primary support group.  Axis V:  55.   Reginald Duncan is not  at risk to harm himself or others.  He agrees to call  [facility] immediately for any thoughts of harming  himself, thoughts of harming others, or distress.   The undersigned provided ego supportive psychotherapy and education.   The indications, alternatives and adverse effects of Lexapro and  Wellbutrin were discussed including the risk of seizures with  Wellbutrin.  The patient understands and would like to proceed as  follows.   RECOMMENDATIONS:  1. Concur with increase in the patient's Lexapro for anti-anxiety as      well as anti-depression.  2. Would start Wellbutrin 100 mg SR p.o. q.a.m. and would increase      over 1 week as tolerated to 150 mg SR p.o. b.i.d.   Given that the patient will have to have fairly rare doctor visits, the  undersigned recommended that he obtain the Feeling Good book, which will  help the patient undergo a self-directed cognitive behavioral therapy  course at home for his anxiety, his negative ruminations and for anti-  depression, synergizing with his medications.   For outpatient psychiatric followup, would recommend one of the clinics  attached to [place] or [facility].  If  possible, would ask the social worker to arrange for psychotherapy in  the home.  It is unlikely that a house call counselor can be found, but  there are occasionally those psychologists in the community.      Felizardo Hoffmann, M.D.  Electronically Signed     JW/MEDQ  D:  04/28/2008  T:  04/28/2008  Job:  [id number]

## 2011-03-07 NOTE — Consult Note (Signed)
NAMEATHENS, MISHRA NO.:  [id number]   MEDICAL RECORD NO.:  [id number]          PATIENT TYPE:  INP   LOCATION:  [id number]                         FACILITY:  [facility]   PHYSICIAN:  Felizardo Hoffmann, M.D.  DATE OF BIRTH:  [birth year]   DATE OF CONSULTATION:  05/01/2008  DATE OF DISCHARGE:                                 CONSULTATION   This is a consultation followup.   Mr. Brick still has depression.  He is not having any adverse medication  effects.  His energy is still decreased.  He does have intact hope.  He  is not having any hallucinations or delusions.   His orientation and memory function are intact.  He is cooperative with  health care.   REVIEW OF SYSTEMS:  GASTROINTESTINAL:  No nausea or other adverse  effects from medication.   LABORATORY DATA:  Sodium 141, BUN 32, creatinine 1.59, glucose 94.  WBC  5.3, hemoglobin 13.2, platelet count 168.   PHYSICAL EXAMINATION:  VITAL SIGNS:  Temperature 97.6, pulse 69,  respiratory rate 20, blood pressure 94/61, O2 saturation on room air  97%.   MENTAL STATUS EXAM:  Mr. Emminger is lying in a supine position in his  hospital bed.  His eye contact is good.  He is alert.  His orientation  is intact to all spheres.  His memory function is intact to immediate,  recent and remote.  His affect is constricted.  His mood is depressed.  His speech is within normal limits except for soft volume, and thought  process is logical, coherent and goal-directed.  Thought content:  No  thoughts of harming himself.  No thoughts of harming others.  No  delusions, no hallucinations.  Judgment is intact.   ASSESSMENT:  AXIS I:  1. 293.83, mood disorder not otherwise specified (general medical and      idiopathic factors).  2. 296.33, major depressive disorder.  3. 293.84, anxiety disorder not otherwise specified.   Mr. Hersh is not at risk to harm himself or others.  He agrees to call  emergency services immediately for any thoughts of  harming himself,  thoughts of harming others, or other psychiatric emergency symptoms.   The undersigned provided ego supportive psychotherapy and education.   RECOMMENDATIONS:  Would increase the Wellbutrin for antidepressant  augmentation to 100 mg SR p.o. b.i.d.  Would continue the Lexapro 20 mg  q.a.m. for anti- depression as well as anti-anxiety.   Outpatient psychiatric followup can be obtained at one of the clinics  attached to [place] or [facility].  The  patient could benefit from cognitive behavioral therapy.  He is going to  obtain the Feeling Good book.      Felizardo Hoffmann, M.D.  Electronically Signed     JW/MEDQ  D:  05/01/2008  T:  05/01/2008  Job:  [id number]

## 2011-03-07 NOTE — Consult Note (Signed)
Reginald Duncan, Reginald Duncan NO.:  [id number]   MEDICAL RECORD NO.:  [id number]          PATIENT TYPE:  INP   LOCATION:  [id number]                         FACILITY:  [facility]   PHYSICIAN:  Eustace Moore, MD     DATE OF BIRTH:  05/14/1951   DATE OF CONSULTATION:  04/30/2008  DATE OF DISCHARGE:                                 CONSULTATION   CHIEF COMPLAINT:  Lumbar spinal stenosis.   PRESENT ILLNESS:  Reginald Duncan is a 60 year old obese white male who is  admitted on April 25, 2008, for syncopal workup who complains of a long  history of low back pain.  It worsened about a year and half ago.  He  was seen by orthopedic surgeons at [facility] in [place].  He  was recommended epidural steroid injections and refuse to have these  because of the risk involved, he was told that he could have paralysis  from these.  He has complained of chronic low back pain on the right  side which radiates into the right lower extremity, mostly in the calf  without significant numbness, tingling or weakness other than some  numbness related to his diabetes.  He was admitted after a syncopal  spell.  He has had a cardiac workup.  He is also had a workup for  depression while in the hospital.  He complains of back pain worse than  leg pain.  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  The patient does have some  difficulty with ambulation and has worked with physical therapy.  It  seems while in the hospital, though he states almost fell a couple of  times it seems like secondary to that, they have held physical therapy.   PAST MEDICAL HISTORY:  Diabetes mellitus; cerebrovascular accident in  2006; depression; hypertension; and myocardial infarction in 1994,  status post stent placement.   MEDICATIONS:  Lantus insulin, Protonix, Mucinex, Amaryl, niacin, Actos,  Lexapro, Pamelor, Norvasc, Crestor, Benicar, hydrochlorothiazide,  Lopressor,  aspirin, magnesium oxide, nitroglycerin patch, Wellbutrin,  Dilaudid, Flexeril, Ambien, Phenergan, Zofran,Ventolin inhaler, Atrovent  inhaler, morphine, and Valium as needed.   ALLERGIES:  CODEINE.   SOCIAL HISTORY:  He does not smoke cigarettes or drink alcohol.  He is  disabled.  He was a truck driver previously.   FAMILY HISTORY:  Positive for MI.   PHYSICAL EXAMINATION:  VITAL SIGNS:  Temperature is 97.6, pulse 90,  respirations 20, blood pressure 124/91, and oxygen saturations are 90%  on room air.  GENERAL:  Pleasant, morbidly obese white male in no acute distress.  HEENT:  Normocephalic and atraumatic.  Extraocular movements are intact.  NECK:  Supple.  Full range of motion.  HEART:  Regular rate and rhythm.  EXTREMITIES:  No clubbing, cyanosis or edema.  NEUROLOGIC:  He is awake and alert.  He is interactive.  No aphasia.  Good attention span.  Fund of knowledge and memory are appropriate.  No  facial asymmetry.  Tongue protrudes at midline.  Good shoulder shrug.  His strength is 5/5,  and his lower extremities are good muscle tone and  good muscle bulk tone in bed exam.  He does have some increase in his  right-sided low back pain when examining the proximal lower extremity  strength that did not have him ambulate.  His sensation is grossly  intact.  Reflexes are little hypoactive in the lower extremities.   IMAGING STUDIES:  MRI of the lumbar spine.  I have reviewed as well as  the report, it does show multilevel spondylosis with some disk  degeneration with broad-based disk bulges and epidural lipomatosis  causing significant spinal stenosis really from L1 to S1.  There is no  spondylolisthesis.  There is some facet arthropathy.   ASSESSMENT AND PLAN:  This 60 year old gentleman with significant past  medical history and multiple medications who is morbidly obese.  He  complains of chronic low back pain with some right leg pain.  He has had  some difficulty ambulation  and it is difficult to note that this is from  claudication or from pain or secondary to his prior syncopal episode.  His MRI does show some significant stenosis of the lumbar spine.  Continue now analgesics as you were doing.  I think he is a candidate  for an epidural steroid injection.  I have discussed these injections  with him as well as the risk of potential benefits.  He has demonstrated  understanding.  Radiology can perform these while in the hospital.  I  would continue his physical and occupational therapy and work on  strengthening and ambulation.  He should have a walker at the bedside  and potentially bedside commode.  I would then have him follow with me  on an outpatient basis about 2 weeks after discharge.  We can continue  our treatment of his spinal stenosis, back pain, and leg pain.      Eustace Moore, MD  Electronically Signed     DSJ/MEDQ  D:  04/30/2008  T:  05/01/2008  Job:  [id number]

## 2011-03-07 NOTE — Consult Note (Signed)
NAMEYONAS, HOVDE NO.:  [id number]   MEDICAL RECORD NO.:  [id number]          PATIENT TYPE:  INP   LOCATION:  [id number]                         FACILITY:  [facility]   PHYSICIAN:  Barnett Abu, M.D.  DATE OF BIRTH:  12-21-1950   DATE OF CONSULTATION:  04/26/2008  DATE OF DISCHARGE:                                 CONSULTATION   REASON FOR CONSULTATION:  Chest pain.   REQUESTING PHYSICIAN:  Vernell Leep, MD   PROBLEM:  1. Noncardiac chest pain, cardiac enzymes negative x2.  2. History of coronary artery disease, MiniVision stentingOM1, 2006.      a.     Bare-metal MiniVision stent OM1 July 18, 2005.      b.     Abnormal Cardiolite, anterolateral ischemia, atypical chest       pain leading to above.  3. 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  4. Syncope.  Prior to admission, the patient not supine, likely      vasovagal, see below.  5. Chronic long-term depression and anxiety.  6. Disabled, almost bedridden, mechanical low back pain.  7. Hypertension.  8. Previous myocardial infarction in 1994, normal left ventriculogram      2006.  9. Obesity.  10.Mild hypokalemia, presenting.  11.History of noncompliance (see cath report October 2006).   RECOMMENDATIONS:  1. Adenosine pharmacologic stress, 2-day Cardiolite versus cardiac      catheterization.  We will keep n.p.o.  Order Cardiolite and place      on cath lab add-on board.  Dr. Verlon Setting to decide in the a.m.  2. Continue aspirin, topical nitroglycerin, no anticoagulation unless      ECG changes or positive enzymes.  3. Recommend psych consult while in hospital.  The patient is severely      depressed and this is exacerbating all physical symptoms.   FINDINGS:  [name] is an unfortunate 61 year old man who was  admitted on April 25, 2008 after a syncopal episode, which came on while  he was supine with the head up in bed.  He noticed the onset of frontal  and then  spreading to include the whole face a hot sensation.  He was  talking on the cell phone to a friend at that time.  He put the phone  down, noticed the onset of nausea and diaphoresis.  He he sat up, had  additional nausea, lightheadedness, and visual changes.  He attempted to  stand and then fell to the floor.  He is uncertain how long he was  out.  He did regain consciousness on the floor.  He could not speak  loudly.  He was calling for his roommates.  He finally obtained  attention by banging a chair leg on the floor.  The initial responder  noted he was speaking with slurred speech.  EMS was called.  I do not  have that record, but he states his blood pressure was normal at  arrival.  He did obtain a blood glucose that was 80.   He was subsequently brought to the emergency room and admitted.  Overnight, he had the onset of abrupt left lower parasternal sharp  stabbing pain.  He called for help from the nurse.  He was administered  one sublingual nitroglycerin.  The discomfort resolved.  There were no  ECG changes.   He does have a history of chest pain elephant on my chest, pressure,  heaviness, squeezing in the heart.  It comes on spontaneously, last  minutes, and then resolves.  This has been going on for years.  It has  not worsened lately.   PAST MEDICAL HISTORY:  As noted above.   SOCIAL HISTORY:  The patient is a nonsmoker.  He has never smoked.  He  quit drinking 4 years ago and using recreational drugs at that time.   He lives like a hermit.  He spends most of his time in bed.  Very  difficult for him to ambulate.  Goes out about once a month to [employer]  or [employer] to get his medications.  It is very difficult for him to walk  more than 50 to 100 feet without having to sit and rest.  He is  currently disabled with Medicaid coverage.  He was a long distance Insurance claims handler.   REVIEW OF SYSTEMS:  Chronic mechanical low back pain.  Chronic left  shoulder  pain.  Chest pain as described above, exertional dyspnea.  Lower extremity edema if standing for any period of time.  No tachy  palpitation.  This was his first episode of passing out.  He does have  chronic constipation and hiatal hernia/reflux.  No hematochezia or  melena.  He has to wear glasses.  He is not having any difficulty  swallowing.  No visual changes other than described above.  No chronic  headaches.  No cough or hemoptysis.  He has been wheezing some lately.   He admits to marked depression and anxiety.  He has lost some of his  family members and friends who passed away recently.  He has not worked  since July 2008.   PHYSICAL EXAMINATION:  VITAL SIGNS:  Blood pressure is 126/84, pulse is  100 and regular, respiratory rate 20, temperature 98.7, and weight is  138.4 kg.  GENERAL:  He is an obese, anxious, 60 year old Caucasian male supine in  bed.  HEENT:  Unremarkable.  The extraocular movements are intact.  Sclerae  are anicteric.  Oral mucosa is pink and moist.  The tongue is not  coated.  NECK:  Supple without thyromegaly or masses.  The carotid upstrokes are  normal.  There is no bruit.  There is no JVD.  CHEST:  Diminished breath sounds bilaterally.  No wheezes, rales, or  rhonchi.  The cardiac tones are diminished as well.  I can hear S1, S2.  No murmur, gallop, or rub.  The anterior chest wall is nontender.  ABDOMEN:  Obese, soft, nontender, positive bowel sounds throughout.  EXTERNAL GENITALIA:  Normal male phallus, descended testicles.  No  lesions.  RECTAL:  Not performed.  EXTREMITIES:  Show venous stasis skin changes, knees to the feet.  There  are intact distal pulses.  The range of motion is full.  NEUROLOGIC:  Examination is nonfocal.  SKIN:  Warm, dry, and clear.   ACCESSORY CLINICAL DATA:  Admission electrocardiogram shows right axis,  sinus rhythm, no acute changes.  Small Q-wave in lead 3.  D-dimer was  0.54.  Serum electrolytes show potassium of  3.0 and a creatinine of 1.6,  BUN  of 30.  CK-MB is negative x2 as well as troponin.  TSH is pending.   COMMENTS:  Some cardiac imaging is required based on the symptoms of  chest pain as well his history of significant obstructive CAD and stent  placement.  I am uncertain as to why he had the syncopal episode.  It  was quite vagal in nature.  As noted, he did not pass out supine.  He  was attempting to sit and stand at that time.   I would recommend psychiatry consult while in the hospital.  The patient  does not have well-controlled symptoms on Lexapro alone.  He would  certainly benefit from a long-term psychiatric care.  He is asking my  opinion for appropriate antidepressants.  I have deferred to his primary  care physician and perhaps the psychiatrist.      Barnett Abu, M.D.  Electronically Signed     JHE/MEDQ  D:  04/26/2008  T:  04/27/2008  Job:  [id number]   cc:   Kaylyn Lim., M.D.  Chipper Herb, M.D.

## 2011-03-07 NOTE — Discharge Summary (Signed)
NAMEELMON, Reginald Duncan NO.:  [id number]   MEDICAL RECORD NO.:  [id number]          PATIENT TYPE:  INP   LOCATION:  [id number]                         FACILITY:  [facility]   PHYSICIAN:  Sherryl Manges, M.D.  DATE OF BIRTH:  1951-08-19   DATE OF ADMISSION:  04/25/2008  DATE OF DISCHARGE:  05/03/2008                               DISCHARGE SUMMARY   PRIMARY CARE Evita Merida:  [facility].   DISCHARGE DIAGNOSES:  1. Syncope, vasovagal.  2. Atypical chest pain.  3. History of coronary artery disease, status post stent.  4. Hypertension.  5. Type 2 diabetes mellitus with neuropathy.  6. Depression/anxiety.  7. Mechanical low back pain/lumbar spinal degenerative joint disease      and stenosis, status post lumbar epidural steroid injection May 01, 2008.  8. Obesity/obstructive sleep apnea syndrome.   DISCHARGE MEDICATIONS:  1. Lantus insulin 72 units subcutaneously daily.  2. Amaryl 4 mg p.o. daily.  3. Niacin 500 mg p.o. q.h.s.  4. Actos 30 mg p.o. daily.  5. Lexapro 20 mg p.o. daily.  6. Nortriptyline 25 mg p.o. q.h.s..  7. Crestor 20 mg p.o. daily.  8. Benicar-HCT 20/12.5 one p.o. daily (was on Benicar-HCT 40/25      daily).  9  Lopressor 100 mg p.o. b.i.d.  1. Aspirin 325 mg p.o. daily.  2. Wellbutrin SR 100 mg p.o. b.i.d.  3. Vicodin 5/500 one p.o. p.r.n. q. 6 hourly.   NOTE:  Amlodipine has been discontinued, secondary to borderline low  blood pressure.   PROCEDURES.:  1. Chest x-ray dated April 25, 2008:  This showed mild chronic      bronchitic changes without acute infiltrate.  2. Head CT scan dated April 25, 2008:  This showed no acute intracranial      abnormalities.  There was mild generalized atrophy.  3. Chest x-ray repeated April 26, 2008:  This showed no acute lung      disease, slight hyperaeration.  4. Ventilation-perfusion lung scan dated April 26, 2008:  This showed      very low probability for acute pulmonary embolism.  5. MRI  of lumbar spine dated April 29, 2008:  This showed a degree of      diffuse spinal stenosis related to the combination of epidural      lipomatosis and some levels continued to be short pedicles      superimposed with advanced degenerative changes at L2-L3, L3-L4 and      L4-L5 resulting in moderate to severe spinal stenosis, mild      multifactorial neural foraminal stenosis at L3-L4 and L4-L5.  6. Therapeutic lumbar epidural steroid injection May 01, 2008 by Dr.      Barbie Banner, interventional radiologist, uncomplicated procedure.   CONSULTATIONS:  1. Dr. Rodell Perna, Cardiology.  2. Dr. Felizardo Hoffmann, Psychiatrist.  3. Dr. Sherley Bounds, Neurosurgeon.  4. Dr. Barbie Banner, Interventional radiologist.   ADMISSION HISTORY:  As in H&P notes of April 25, 2008 dictated by Dr.  Deidre Ala B. Bakare.  However, in brief, this is a 60 year old male,  with  known history of type 2 diabetes mellitus, status post CVA 2006,  depression, hypertension, coronary artery disease, status post MI 1994,  status post stent placement, presenting with a syncopal episode.  Reportedly, the patient was talking to his friend over the phone at  about 11:30 p.m. on the day of presentation, when he put down the phone,  felt dizzy and passed out.  He was brought to the emergency department  by EMS and was admitted for further evaluation, investigation and  management.   CLINICAL COURSE.:  1. Syncopal episode:  The patient had no acute ischemic EKG changes.      Cardiac enzymes were cycled and remained unelevated.  He remained      in sinus rhythm during telemetric monitoring.  Workup, including      head CT scan and chest x-ray were unremarkable, as was a      ventilation-perfusion lung scan and bilateral caroid/vertebral      artery doppler.  The patient's blood pressure was borderline at      108/70.  He was seen by Dr. Rodell Perna on cardiologic      consultation.  For details of consultation, refer to the      consultation  notes of  April 26, 2008.  The conclusion, was that the      patient suffered a vasovagal episode.  During the course of his      hospitalization, there were no recurrences.   1. Atypical chest pain:  Because of complaints of chest pain, a      Cardiology consultation was called, which was kindly provided      initially by Dr. Rodell Perna.  The patient subsequently underwent      a cardiac catheterization on April 27, 2008, which showed      nonobstructive coronary artery disease.  EF was 60-65%.      Recommendation was aggressive medical management, with risk factor      modification.   Type 2 diabetes mellitus:  This was managed with scheduled Lantus  insulin, carbohydrate modified diet and oral hypoglycemics.  The patient  remained euglycemic throughout the course of his hospitalization.   1. Hypertension:  The patient's blood pressures remained borderline      low, during the course of his hospitalization.  His pre-admission      Norvasc has been discontinued.  He had initially been placed on a      Nitro patch because of chest pain.  This was subsequently      discontinued, again, because of borderline low blood pressures,      without any deleterious consequences.   1. History of depression/anxiety:  The patient was seen by Dr. Felizardo Hoffmann, psychiatrist, on April 28, 2008 and again on May 01, 2008.  For details of his consultation, refer to consultation notes      of the above-mentioned date.  He concluded that the patient did      have mood disorder, not otherwise specified, as well as some      characteristics of a generalized anxiety disorder.  He recommended      continuation of the patient's Lexapro and has placed the patient on      Wellbutrin SR 100 mg p.o. b.i.d.  In addition, he has recommended      outpatient psychiatric followup.   1. Mechanical low back pain:  The patient continued during the course  of his hospitalization, to complain of low back  pain which he said      had exacerbated since his hospitalization.  He is morbidly obese,      raising the specter of degenerative disease of the lumbosacral      spine. He underwent MRI of the lumbar spine on April 29, 2008.  For      details, refer to the procedure list above.  However, this showed      multifactorial lumbar stenosis, necessitating consultation by the      neurosurgeon, which was kindly provided by Dr. Ronnald Ramp.  For details      of this consultation, refer to consultation notes of July, 9, 2009.      Dr. Ronnald Ramp has recommended analgesics, physiotherapy and lumber      epidural steroid injection.  This was performed by Dr. Marybelle Killings,      interventional radiologist, on July 10, [id number], in an uncomplicated      procedure, with amelioration of pain.  The patient was evaluated by      PT, OT and has recommended a walking cane.  He is to followup      following discharge, with Dr. Ronnald Ramp, likely for further epidural      injections.   1. Obesity/obstructive sleep apnea syndrome:  The patient remained      stable from this viewpoint during the course of his      hospitalization.   DISPOSITION:  The patient was on  May 02, 2008, considered clinically  stable for discharge to be considered on  May 03, 2008, provided no  acute problems arise in the interim.   DIET:  A heart-healthy/carbohydrate modified diet.   ACTIVITY:  As tolerated, otherwise per PT/OT.   FOLLOWUP INSTRUCTIONS:  The patient is to followup with his primary MD  at [facility] practice per prior scheduled appointment.  He is  to followup with Dr. Ronnald Ramp, Neurosurgeon, the date to be determined.  He  has been supplied with appropriate information and Dr. Ronnald Ramp has given  him his card with special instructions.  Home health, PT, OT, walking  cane .   Note:  The patient underwent carotid artery Doppler scan on  April 27, 2008, which showed no significant left or right ICA stenosis.  There was  vertebral  artery antegrade flow bilaterally.      Sherryl Manges, M.D.  Electronically Signed     CO/MEDQ  D:  05/02/2008  T:  05/02/2008  Job:  [id number]   cc:   [facility]  Eustace Moore, MD  Felizardo Hoffmann, M.D.

## 2011-03-07 NOTE — H&P (Signed)
NAMEKALE, SIBOLE                 ACCOUNT NO.:  [id number]   MEDICAL RECORD NO.:  [id number]          PATIENT TYPE:  EMS   LOCATION:  MAJO                         FACILITY:  [facility]   PHYSICIAN:  Mobolaji B. Bakare, M.D.DATE OF BIRTH:  1951/08/25   DATE OF ADMISSION:  04/25/2008  DATE OF DISCHARGE:                              HISTORY & PHYSICAL   PRIMARY CARE PHYSICIAN:  Sports administrator.   CARDIOLOGIST:  Dr. Verlon Setting.   CHIEF COMPLAINT:  Passing out.   HISTORY OF PRESENTING COMPLAINT:  The patient was in his usual state of  health until last night about 11:30 p.m., when he was talking to his  friend over the phone.  He started feeling flushed and hot.  He felt  nauseous, but did not vomit.  He subsequently put down the phone, and he  remembered that he was feeling dizzy and passed out.  When he came  around, he had to call for help by banging on the floor.  His roommates  came to his assistance and they called EMS.  Upon arrival by EMS, his  blood glucose was 80.  The patient denies chest pain or shortness of  breath associated with this syncopal episode.  He was brought to the  emergency room by the EMS, and his initial EKG showed no acute ST  changes.  He had a head CT scan which was unremarkable.  The patient  will be admitted for further evaluation.   REVIEW OF SYSTEMS:  No fever, cough or shortness of breath.  No  abdominal pain, diarrhea or constipation.   PAST MEDICAL HISTORY:  1. Diabetes mellitus.  2. Cerebrovascular accident in 2006.  3. Depression.  4. Hypertension.  5. Myocardial infarction in 1994, status post stent placement.   MEDICATIONS:  Actos, Ambien, aspirin, Benicar, Crestor, Lantus 72 units  in a.m., nortriptyline, Norvasc.  The dosages of these medications are  unknown.   ALLERGIES:  CODEINE.   FAMILY HISTORY:  Significant for coronary artery disease in his father  who had MI in his 72s.   SOCIAL HISTORY:  The patient does not smoke  cigarettes nor drink  alcohol.  He is disabled.  He was a truck driver previously.  He does  not smoke cigarettes nor drink alcohol.   PHYSICAL EXAMINATION:  VITAL SIGNS:  Temperature 97.6, blood pressure  108/70, pulse of 71, respiratory rate of 20.  O2 sats of 97%.  GENERAL:  The patient is obese, not in respiratory distress.  HEENT:  Normocephalic, atraumatic.  Pupils equal, round and reactive to  light.  Extraocular muscle movements intact.  NECK:  No elevated JVD or carotid bruit.  LUNGS:  Clear clinically to auscultation.  CVS:  S1-S2.  ABDOMEN:  Not distended.  Soft and nontender.  Bowel sounds present.  EXTREMITIES:  1+ pitting pedal edema.  No calf tenderness.  CNS:  No focal neurological deficit.   LABORATORY DATA:  Initially, sodium 138, potassium 3.3, chloride 102,  bicarb 27, glucose 126, BUN 33, creatinine 1.71, calcium 9.1.  White  cells 5.1, hemoglobin 14.7, hematocrit 43.3,  platelets 299.  Cardiac  markers as the point of care, myoglobin 246, troponin less than 0.05.   RADIOLOGICAL DATA:  Head CT scan showed no acute intracranial  abnormality.  Mild generalized atrophy with normal ventricular  morphology.  Chest x-ray shows no acute disease, chronic bronchitic  changes.   ASSESSMENT AND PLAN:  1. Mr. Deguia is a 60 year old Caucasian male with multiple coronary      risk factors.  He is presenting with syncope.  The patient will be      admitted to telemetry bed.  Will cycle cardiac enzymes, obtain 2-D      echocardiogram and check orthostatic blood pressure in the      emergency room.  2. Diabetes mellitus.  Will resume home medications and monitor blood      glucose closely to avoid hypoglycemia.  Will check hemoglobin A1c.  3. We need to clarify dosages of medications.  4. Hypokalemia.  Will replete with potassium chloride 40 mEq x1 hour.      Mobolaji B. Maia Petties, M.D.  Electronically Signed     MBB/MEDQ  D:  04/25/2008  T:  04/25/2008  Job:  [id number]    cc:   Lockie Pares, M.D.  [facility]

## 2011-03-10 NOTE — Cardiovascular Report (Signed)
Reginald Duncan, ROHLIK                 ACCOUNT NO.:  [id number]   MEDICAL RECORD NO.:  [id number]          PATIENT TYPE:  OIB   LOCATION:  2859                         FACILITY:  [facility]   PHYSICIAN:  Jeanella Craze. Little, M.D. DATE OF BIRTH:  May 09, 1951   DATE OF PROCEDURE:  07/21/2005  DATE OF DISCHARGE:                              CARDIAC CATHETERIZATION   INDICATIONS FOR TEST:  This 60 year old male has a long history of  hypertension and is diabetic.  He presented with some atypical chest pain,  had a nuclear study performed that showed anterior lateral ischemia and  because this is brought in for outpatient cardiac catheterization.   After obtaining informed consent, the patient was prepped and draped in the  usual sterile fashion.  Following local anesthetic with 1% Xylocaine, the  Seldinger technique employed and a 5-French introducer sheath was placed  into the right femoral artery.  Left and right coronary arteriography,  ventriculography in the RAO projection, and a distal aortogram was  performed.   RESULTS:  1.  Central aortic pressure was 121/78.  Left ventricular pressure 124/14.      No significant gradient was noted across the aortic valve at the time of      pullback.  2.  Ventriculography.  Ventriculography in the RAO projection revealed      normal LV systolic function.  Ejection fraction __________  60%.  Mitral      valve prolapse with no mitral regurgitation was seen.  The left      ventricular end-diastolic pressure was 18.   DISTAL AORTOGRAM:  Done above the level of the renals showed no evidence of  an abdominal aortic aneurysm but did have a marked tortuous terminal aorta.  There was no evidence of renal artery stenosis or proximal iliac disease.   CORONARY ARTERIOGRAPHY:  On fluoroscopy faint calcification seen in the  proximal LAD.  1.  Left main normal.  2.  The circumflex had two OM vessels. OM-1 was a moderate-sized vessel and      had a mid area of 80%  eccentric narrowing.  The ongoing circumflex was      free of disease, and OM-2 was small and free of disease.  3.  Ostial diagonal/ramus intermediate normal.  4.  LAD.  The LAD extended down to the apex of the heart.  There was mild to      moderate areas of ectasia in the proximal segment.  There was one area      of narrowing that was around 50% narrowed proximally.  The first      diagonal had ostial and proximal 40-50% area of narrowing.  This was a      medium-sized vessel.  This area extended all way to the ostium.  5.  Right coronary artery. The right coronary was a large vessel at about 5-      mm.  It was dilated and ectatic through the proximal mid and distal      segments.  The PDA, PL-1 and PL-2  were free of disease.   CONCLUSION:  1.  Mild to moderate disease in the left anterior descending artery diagonal      system.  2.  High-grade stenosis and OM-1.  3.  Normal left ventricle systolic function.  4.  No abdominal aortic aneurysm.  5.  No renal artery stenosis.   Arrangements for intervention to the circumflex OM vessel was then  undertaken.  After discussing this with the patient,  he became vagal.  His  blood pressure dropped into the 80s.  He became a little nauseous, and his  heart rate slowed to 45. This was treated with IV fluids, and his pressure  normalized his heart rate that came into the 60s.  His a 5-French sheath was  exchanged out for a 6-French sheath.  A 6-French Voda-4 guide catheter was  used and a short Luge wire.  After the wire was well past the area of  obstruction at the OM-1 vessel, a 2.5 x 12 Mini Vision stent was placed in  such a manner that both the proximal and distal portions were well covered.  It was initially deployed at 12 atmospheres for 66 seconds with the final  inflation being 12 atmospheres for 54 seconds.  The area that had been 80%  eccentrically narrowed pre intervention appeared to be normal following the  intervention.   There was no evidence of any dissection or thrombus and there  was brisk TIMI III flow in this vessel pre and post intervention.   I purposefully used a Mini Vision non drug-eluting stent.  The patient has  proven noncompliance of his medications and will not even take his aspirin  because of the expense.  With this in mind, I did not feel that we needed  to commit this patient to long-term Plavix therapy.   He was given double bolus Integrilin, 6,100 units of IV heparin, had an ACT  at the beginning of this procedure of 269 and ACT at the end of the  procedure of 200.  He was also given 600 mg of oral Plavix.   TOTAL CONTRAST:  Was 270 cc.           ______________________________  Jeanella Craze. Little, M.D.     ABL/MEDQ  D:  07/21/2005  T:  07/21/2005  Job:  [id number]   cc:   Tomi Bamberger, Dr.   [facility]   Jeanella Craze. Little, M.D.  Fax: [phone]

## 2011-03-13 NOTE — Telephone Encounter (Signed)
Patient called to make sure he is to come for lab work on 01/13/11. Pt.wants to make sure it is not a mistake. I let pt. Know that on the last office visit Dr. Harrington Challenger increased the Furosemide from 40 mg to 60 mg.   medication  dose was changed so labs needs to be done to see any changes in electrolytes. Pt. Verbalized understanding. Patient states will come tomorrow for labs.

## 2011-03-14 ENCOUNTER — Other Ambulatory Visit (INDEPENDENT_AMBULATORY_CARE_PROVIDER_SITE_OTHER): Payer: PRIVATE HEALTH INSURANCE | Admitting: *Deleted

## 2011-03-14 DIAGNOSIS — I2510 Atherosclerotic heart disease of native coronary artery without angina pectoris: Secondary | ICD-10-CM

## 2011-03-14 LAB — BASIC METABOLIC PANEL
BUN: 27 mg/dL — ABNORMAL HIGH (ref 6–23)
CO2: 28 mEq/L (ref 19–32)
Chloride: 101 mEq/L (ref 96–112)
Creatinine, Ser: 2 mg/dL — ABNORMAL HIGH (ref 0.4–1.5)

## 2011-03-23 ENCOUNTER — Telehealth: Payer: Self-pay | Admitting: *Deleted

## 2011-03-23 DIAGNOSIS — I2510 Atherosclerotic heart disease of native coronary artery without angina pectoris: Secondary | ICD-10-CM

## 2011-03-23 NOTE — Telephone Encounter (Signed)
Pt. Returning your call.

## 2011-07-20 LAB — MAGNESIUM: Magnesium: 1.7

## 2011-07-20 LAB — BASIC METABOLIC PANEL
BUN: 30 — ABNORMAL HIGH
BUN: 39 — ABNORMAL HIGH
CO2: 26
CO2: 27
CO2: 27
CO2: 28
CO2: 29
Calcium: 9.1
Calcium: 9.3
Calcium: 9.3
Chloride: 102
Chloride: 103
Chloride: 105
Chloride: 106
Chloride: 107
Creatinine, Ser: 1.38
Creatinine, Ser: 1.46
Creatinine, Ser: 1.61 — ABNORMAL HIGH
Creatinine, Ser: 1.71 — ABNORMAL HIGH
Creatinine, Ser: 1.72 — ABNORMAL HIGH
GFR calc Af Amer: >60
GFR calc Af Amer: >60
GFR calc non Af Amer: 41 — ABNORMAL LOW
Glucose, Bld: 126 — ABNORMAL HIGH
Glucose, Bld: 139 — ABNORMAL HIGH
Glucose, Bld: 74
Glucose, Bld: 93
Potassium: 3.8
Potassium: 4.1
Sodium: 137
Sodium: 138
Sodium: 141
Sodium: 142

## 2011-07-20 LAB — CARDIAC PANEL(CRET KIN+CKTOT+MB+TROPI)
CK, MB: 3.1
Relative Index: 1.4
Relative Index: 1.8
Total CK: 186
Total CK: 206
Troponin I: 0.01
Troponin I: 0.01

## 2011-07-20 LAB — CBC
HCT: 37.1 — ABNORMAL LOW
HCT: 38 — ABNORMAL LOW
Hemoglobin: 13.2
Hemoglobin: 14.7
MCHC: 34
MCHC: 34.7
MCV: 91.3
MCV: 91.6
RBC: 4.06 — ABNORMAL LOW
RDW: 15
RDW: 15.6 — ABNORMAL HIGH
WBC: 5.1

## 2011-07-20 LAB — APTT: aPTT: 37

## 2011-07-20 LAB — DIFFERENTIAL
Basophils Absolute: 0
Basophils Relative: 0
Eosinophils Absolute: 0.1
Eosinophils Relative: 2
Monocytes Absolute: 0.2
Neutro Abs: 3.9

## 2011-07-20 LAB — D-DIMER, QUANTITATIVE: D-Dimer, Quant: 0.54 — ABNORMAL HIGH

## 2011-07-20 LAB — CK TOTAL AND CKMB (NOT AT ARMC): Total CK: 168

## 2011-07-20 LAB — LIPID PANEL
Cholesterol: 116
Triglycerides: 187 — ABNORMAL HIGH

## 2011-07-20 LAB — POCT CARDIAC MARKERS: CKMB, poc: 3.4

## 2011-07-20 LAB — TROPONIN I: Troponin I: 0.01

## 2011-07-20 LAB — TSH: TSH: 1.68 (ref 0.350–4.500)

## 2011-11-10 ENCOUNTER — Ambulatory Visit: Payer: PRIVATE HEALTH INSURANCE | Admitting: Internal Medicine

## 2011-12-21 ENCOUNTER — Telehealth: Payer: Self-pay | Admitting: Internal Medicine

## 2011-12-21 NOTE — Telephone Encounter (Signed)
Spoke with patient and advised needed appointment.  Melissa H in scheduling going to set up for patient

## 2011-12-21 NOTE — Telephone Encounter (Signed)
New msg Pt called and was confused about when he needs to come back for appt with Dr Harrington Challenger Please call

## 2012-02-22 ENCOUNTER — Ambulatory Visit: Payer: PRIVATE HEALTH INSURANCE | Admitting: Internal Medicine

## 2012-03-07 ENCOUNTER — Ambulatory Visit: Payer: PRIVATE HEALTH INSURANCE | Admitting: Internal Medicine

## 2012-04-02 ENCOUNTER — Ambulatory Visit: Payer: PRIVATE HEALTH INSURANCE | Admitting: Physician Assistant

## 2012-05-24 ENCOUNTER — Ambulatory Visit: Payer: PRIVATE HEALTH INSURANCE | Admitting: Internal Medicine

## 2012-07-11 ENCOUNTER — Encounter: Payer: Self-pay | Admitting: *Deleted

## 2012-07-22 ENCOUNTER — Encounter: Payer: Self-pay | Admitting: *Deleted

## 2012-09-02 ENCOUNTER — Ambulatory Visit (INDEPENDENT_AMBULATORY_CARE_PROVIDER_SITE_OTHER): Payer: Medicare Other | Admitting: Internal Medicine

## 2012-09-02 ENCOUNTER — Encounter (HOSPITAL_COMMUNITY): Payer: Self-pay | Admitting: Pharmacy Technician

## 2012-09-02 ENCOUNTER — Encounter: Payer: Self-pay | Admitting: *Deleted

## 2012-09-02 ENCOUNTER — Encounter: Payer: Self-pay | Admitting: Internal Medicine

## 2012-09-02 ENCOUNTER — Telehealth: Payer: Self-pay | Admitting: Internal Medicine

## 2012-09-02 VITALS — BP 122/80 | HR 96 | Ht 74.4 in | Wt 294.0 lb

## 2012-09-02 DIAGNOSIS — R079 Chest pain, unspecified: Secondary | ICD-10-CM

## 2012-09-02 DIAGNOSIS — N289 Disorder of kidney and ureter, unspecified: Secondary | ICD-10-CM

## 2012-09-02 LAB — CBC WITH DIFFERENTIAL/PLATELET
Basophils Relative: 0.7 % (ref 0.0–3.0)
Eosinophils Absolute: 0.3 10*3/uL (ref 0.0–0.7)
Hemoglobin: 15.3 g/dL (ref 13.0–17.0)
Lymphs Abs: 1.1 10*3/uL (ref 0.7–4.0)
MCHC: 33 g/dL (ref 30.0–36.0)
MCV: 92.4 fl (ref 78.0–100.0)
Monocytes Absolute: 0.7 10*3/uL (ref 0.1–1.0)
Neutro Abs: 4.5 10*3/uL (ref 1.4–7.7)
RBC: 5 Mil/uL (ref 4.22–5.81)
RDW: 16.5 % — ABNORMAL HIGH (ref 11.5–14.6)

## 2012-09-02 LAB — BASIC METABOLIC PANEL
BUN: 33 mg/dL — ABNORMAL HIGH (ref 6–23)
Calcium: 9.9 mg/dL (ref 8.4–10.5)
Creatinine, Ser: 2.7 mg/dL — ABNORMAL HIGH (ref 0.4–1.5)
GFR: 25.74 mL/min — ABNORMAL LOW (ref >60.00)

## 2012-09-02 LAB — PROTIME-INR: Prothrombin Time: 11.9 s (ref 10.2–12.4)

## 2012-09-02 NOTE — Telephone Encounter (Signed)
Called patient back to review cath instructions.

## 2012-09-02 NOTE — Patient Instructions (Addendum)

## 2012-09-02 NOTE — Telephone Encounter (Signed)
Pt having procedure tomorrow, has questions, pls call [phone]

## 2012-09-02 NOTE — Progress Notes (Addendum)
HPI Reginald Duncan is a 61 year old with a history of CAD. He was previously seen by Drs. Little and kersey. He is s/p PTCA/STent. I saw him for the first time in October. He had what sounded like atypical pain. He had been admitted to [facility] prior. A myoview was don that suggested ischemia. in the mid and distal anterolateral wall We attempted to call the patient for a cardiac cath He did not f/u with calls  I saw him in Dec. With continued symptoms and ?syncope I sched a myoview. This was normal.  I saw him last in May of 2012 Since seen he says for the past 3 to 4 months he has had severe CP with exertion.  Doesn't happen all the time.  Didn't have any today.  No CP at rest.  No PND Esophagus has not hurt in a long time due to nexium Allergies  Allergen Reactions  . Codeine     REACTION: Nausea    Current Outpatient Prescriptions  Medication Sig Dispense Refill  . ALPRAZolam (XANAX) 1 MG tablet Take 1 mg by mouth at bedtime as needed.        [name] amLODipine (NORVASC) 10 MG tablet Take 10 mg by mouth daily.        [name] aspirin 325 MG tablet Takes 2 tabs daily      . buPROPion (WELLBUTRIN) 100 MG tablet Take 100 mg by mouth 2 (two) times daily.      . citalopram (CELEXA) 20 MG tablet Take 20 mg by mouth daily.        [name] esomeprazole (NEXIUM) 40 MG capsule Take 40 mg by mouth daily before breakfast.        . fenofibrate micronized (LOFIBRA) 200 MG capsule Take 200 mg by mouth daily before breakfast.      . furosemide (LASIX) 40 MG tablet Take 40 mg by mouth daily.      . hydrALAZINE (APRESOLINE) 10 MG tablet Take 10 mg by mouth. take 2 tablets three times a day      . HYDROcodone-acetaminophen (NORCO) 7.5-325 MG per tablet Take 1 tablet by mouth every 6 (six) hours as needed.        . insulin aspart protamine-insulin aspart (NOVOLOG 70/30) (70-30) 100 UNIT/ML injection Inject into the skin. As directed       . lubiprostone (AMITIZA) 24 MCG capsule Take 24 mcg by mouth daily with breakfast.       . LYRICA 75 MG capsule 75 mg. Take one cap. Twice a day      . metoprolol (LOPRESSOR) 100 MG tablet Take 100 mg by mouth daily.        . nitroGLYCERIN (NITROSTAT) 0.4 MG SL tablet Place 0.4 mg under the tongue every 5 (five) minutes as needed.        . nortriptyline (PAMELOR) 25 MG capsule Take 25 mg by mouth at bedtime.        . Omega-3 Fatty Acids (FISH OIL) 1000 MG CAPS Take 1 capsule by mouth at bedtime.        . pravastatin (PRAVACHOL) 40 MG tablet Take 40 mg by mouth daily.        [name] spironolactone (ALDACTONE) 50 MG tablet Take 1 tab a day      . Tiotropium Bromide Monohydrate (SPIRIVA HANDIHALER IN) Inhale into the lungs. As directed      . TRADJENTA 5 MG TABS tablet 1 a day      . zolpidem (AMBIEN) 10 MG  tablet Take 10 mg by mouth at bedtime as needed.          Past Medical History  Diagnosis Date  . DKA (diabetic ketoacidoses)     admission on 04/2004  . Hypertensive emergency   . Diabetes mellitus     type II. Diagnosed in 1999  . CVA (cerebral infarction) 1996    hx of  w/some residual L arm weakness  . Myocardial infarct 1980  . Coronary artery disease     hx of stent  . Hypertension   . Neuropathy   . DJD (degenerative joint disease)   . GERD (gastroesophageal reflux disease)   . Depression     Past Surgical History  Procedure Date  . Coronary angioplasty with stent placement   . Coronary angioplasty with stent placement     Family History  Problem Relation Age of Onset  . Heart attack Father 51    deceased  . Leukemia Brother 53    deceased  . Kidney cancer Mother 29    deceased    History   Social History  . Marital Status: Divorced    Spouse Name: N/A    Number of Children: N/A  . Years of Education: N/A   Occupational History  . truck driver    Social History Main Topics  . Smoking status: Never Smoker   . Smokeless tobacco: Not on file  . Alcohol Use: No  . Drug Use: No  . Sexually Active: Not on file   Other Topics Concern  . Not  on file   Social History Narrative  . No narrative on file    Review of Systems:  All systems reviewed.  They are negative to the above problem except as previously stated.  Vital Signs: BP 122/80  Pulse 96  Ht 6' 2.4" (1.89 m)  Wt 294 lb (133.358 kg)  BMI 37.34 kg/m2  Physical Exam Patient is a morbidly obese 60 yr old in NAD HEENT:  Normocephalic, atraumatic. EOMI, PERRLA.  Neck: JVP is normal.  No bruits.  Lungs: clear to auscultation. No rales no wheezes.  Heart: Regular rate and rhythm. Normal S1, S2. No S3.   No significant murmurs. PMI not displaced.  Abdomen:  Supple, nontender. Normal bowel sounds. No masses. No hepatomegaly.  Extremities:   Good distal pulses throughout. No lower extremity edema.  Musculoskeletal :moving all extremities.  Neuro:   alert and oriented x3.  CN II-XII grossly intact.  EKG:  SR.  74   RBBB. Assessment and Plan:  1.  CP  Concernig for angina.  He has had negative workups in the past. I would recomm a cardiac cath to define anatomy  2,  HTN  Controlled  3.  CV disease  4.  Neuropathy  5.  HL  Continue statin.   Will check precath labs  Addendum. PRecath labs show Cr is higher than prevous.    Would hold aldactone.  Repeat BMET  Set up for myoview to demonstrate ischemia.

## 2012-09-03 ENCOUNTER — Ambulatory Visit (HOSPITAL_COMMUNITY): Admission: RE | Admit: 2012-09-03 | Payer: Medicare Other | Source: Ambulatory Visit | Admitting: Cardiology

## 2012-09-03 ENCOUNTER — Encounter (HOSPITAL_COMMUNITY): Admission: RE | Payer: Self-pay | Source: Ambulatory Visit

## 2012-09-03 SURGERY — LEFT HEART CATHETERIZATION WITH CORONARY ANGIOGRAM
Anesthesia: LOCAL

## 2012-09-05 ENCOUNTER — Other Ambulatory Visit: Payer: Medicare Other

## 2012-09-05 ENCOUNTER — Other Ambulatory Visit (INDEPENDENT_AMBULATORY_CARE_PROVIDER_SITE_OTHER): Payer: Medicare Other

## 2012-09-05 ENCOUNTER — Telehealth: Payer: Self-pay | Admitting: Internal Medicine

## 2012-09-05 DIAGNOSIS — N289 Disorder of kidney and ureter, unspecified: Secondary | ICD-10-CM

## 2012-09-05 LAB — BASIC METABOLIC PANEL
BUN: 35 mg/dL — ABNORMAL HIGH (ref 6–23)
Calcium: 9 mg/dL (ref 8.4–10.5)
Chloride: 108 mEq/L (ref 96–112)
Creatinine, Ser: 2.3 mg/dL — ABNORMAL HIGH (ref 0.4–1.5)

## 2012-09-05 NOTE — Telephone Encounter (Signed)
Pt returning nurse call, he can be reached at [phone]

## 2012-09-05 NOTE — Telephone Encounter (Signed)
Patient called wanting to know results of bmet done today 09/05/12.Patient was told Dr.Ross not in office and lab has not been reviewed by her yet.Patient stated he was anxious wanting to know creatinine.Patient was told creatinine better but still elevated at 2.3.Message sent to [facility] nurse.

## 2012-09-06 ENCOUNTER — Other Ambulatory Visit: Payer: Medicare Other

## 2012-09-06 ENCOUNTER — Telehealth: Payer: Self-pay

## 2012-09-06 DIAGNOSIS — R0602 Shortness of breath: Secondary | ICD-10-CM

## 2012-09-06 DIAGNOSIS — R609 Edema, unspecified: Secondary | ICD-10-CM

## 2012-09-06 DIAGNOSIS — N289 Disorder of kidney and ureter, unspecified: Secondary | ICD-10-CM

## 2012-09-06 DIAGNOSIS — R079 Chest pain, unspecified: Secondary | ICD-10-CM

## 2012-09-06 NOTE — Telephone Encounter (Signed)
**Note De-Identified  Obfuscation** Cr a little better/ Set up for lexiscan myoview. F/U BMET on day of test. ----- Message ----- From: Merilyn Baba, RN BSN Sent: 09/05/2012 6:47 PM To: [name], MD  Pt. advised, he verbalized understanding. Lexiscan and labs ordered and sent to [facility] pool to schedule and to contact pt. with date and time of labs and Lexi.

## 2012-09-16 ENCOUNTER — Ambulatory Visit (HOSPITAL_COMMUNITY): Payer: Medicare Other | Attending: Internal Medicine | Admitting: Radiology

## 2012-09-16 ENCOUNTER — Other Ambulatory Visit (INDEPENDENT_AMBULATORY_CARE_PROVIDER_SITE_OTHER): Payer: Medicare Other

## 2012-09-16 VITALS — BP 117/73 | Ht 74.0 in | Wt 298.0 lb

## 2012-09-16 DIAGNOSIS — I2510 Atherosclerotic heart disease of native coronary artery without angina pectoris: Secondary | ICD-10-CM

## 2012-09-16 DIAGNOSIS — R0602 Shortness of breath: Secondary | ICD-10-CM

## 2012-09-16 DIAGNOSIS — R079 Chest pain, unspecified: Secondary | ICD-10-CM | POA: Insufficient documentation

## 2012-09-16 LAB — BASIC METABOLIC PANEL
Calcium: 9.5 mg/dL (ref 8.4–10.5)
GFR: 36.81 mL/min — ABNORMAL LOW (ref >60.00)
Glucose, Bld: 65 mg/dL — ABNORMAL LOW (ref 70–99)
Potassium: 4.2 mEq/L (ref 3.5–5.1)
Sodium: 140 mEq/L (ref 135–145)

## 2012-09-16 LAB — BRAIN NATRIURETIC PEPTIDE: Pro B Natriuretic peptide (BNP): 27 pg/mL (ref 0.0–100.0)

## 2012-09-16 MED ORDER — REGADENOSON 0.4 MG/5ML IV SOLN
0.4000 mg | Freq: Once | INTRAVENOUS | Status: AC
  Administered 2012-09-16: 0.4 mg via INTRAVENOUS

## 2012-09-16 MED ORDER — TECHNETIUM TC 99M SESTAMIBI GENERIC - CARDIOLITE
10.0000 | Freq: Once | INTRAVENOUS | Status: AC | PRN
  Administered 2012-09-16: 10 via INTRAVENOUS

## 2012-09-16 MED ORDER — TECHNETIUM TC 99M SESTAMIBI GENERIC - CARDIOLITE
30.0000 | Freq: Once | INTRAVENOUS | Status: AC | PRN
  Administered 2012-09-16: 30 via INTRAVENOUS

## 2012-09-16 NOTE — Progress Notes (Signed)
[facility] [id number] [place] [address] [phone]  Cardiology Nuclear Med Study  Reginald Duncan is a 61 y.o. male     MRN : [id number]     DOB: 1951-03-26  Procedure Date: 09/16/2012  Nuclear Med Background Indication for Stress Test:  Evaluation for Ischemia, Stent Patency and PTCA Patency History:  '80 MI,'06 Angioplasty/Stents: OM 7/09 Heart Cath: N/O CAD EF: 60-65% ECHO: EF: 55-60%, 1/12 MPS: EF: 61% (-) ischemia  Cardiac Risk Factors: CVA, Family History - CAD, Hypertension, IDDM Type 2, Lipids, Obesity and RBBB  Symptoms:  Chest Pain with Exertion, Diaphoresis, Dizziness, DOE, Light-Headedness and SOB   Nuclear Pre-Procedure Caffeine/Decaff Intake:  None > 12 hrs NPO After: 3:00am   Lungs:  clear O2 Sat: 95% on room air. IV 0.9% NS with Angio Cath:  20g  IV Site: R Hand x 1, tolerated well IV Started by:  Irven Baltimore, RN  Chest Size (in):  46 Cup Size: n/a  Height: 6\' 2"  (1.88 m)  Weight:  298 lb (135.172 kg)  BMI:  Body mass index is 38.26 kg/(m^2). Tech Comments:  FBS was 53 at 2:11am with sandwich eaten per patient. 61/4 hr PC BS here was 67 at 9:15 with cranberry juice given x 2. Patient asymptomatic.Patient has an insulin pump with last bolus at 8:00pm last night. Lopressor as usual at 6:00pm yesterday. 45 minute PC BS was 99 at 10:00am.    Nuclear Med Study 1 or 2 day study: 1 day  Stress Test Type:  Carlton Adam  Reading MD: Jenkins Rouge, MD  Order Authorizing Provider:  Dorris Carnes, MD  Resting Radionuclide: Technetium 24m Sestamibi  Resting Radionuclide Dose: 9.0 mCi   Stress Radionuclide:  Technetium 20m Sestamibi  Stress Radionuclide Dose: 30.0 mCi           Stress Protocol Rest HR: 69 Stress HR: 83  Rest BP: 117/73 Stress BP: 117/60  Exercise Time (min): n/a METS: n/a   Predicted Max HR: 160 bpm % Max HR: 51.88 bpm Rate Pressure Product: 9711   Dose of Adenosine (mg):  n/a Dose of Lexiscan:  0.4 mg  Dose of Atropine (mg): n/a Dose of Dobutamine: n/a mcg/kg/min (at max HR)  Stress Test Technologist: Perrin Maltese, EMT-P  Nuclear Technologist:  Charlton Amor, CNMT     Rest Procedure:  Myocardial perfusion imaging was performed at rest 45 minutes following the intravenous administration of Technetium 6m Sestamibi. Rest ECG: NSR-RBBB  Stress Procedure:  The patient received IV Lexiscan 0.4 mg over 15-seconds.  Technetium 61m Sestamibi injected at 30-seconds.  There were no significant changes, sob, and dizzy with Lexiscan.  Quantitative spect images were obtained after a 45 minute delay. Stress ECG: No significant change from baseline ECG  QPS Raw Data Images:  Patient motion noted; appropriate software correction applied. Stress Images:  The images have a mild lumpy bumpy appearance. There is decreased activity at the apex. This is seen in a small area with moderate decrease in photon activity. Rest Images:  The rest images are the same as the stress images. Subtraction (SDS):  No evidence of ischemia. Transient Ischemic Dilatation (Normal <1.22):  1.15 Lung/Heart Ratio (Normal <0.45):  0.42  Quantitative Gated Spect Images QGS EDV:  134 ml QGS ESV:  54 ml  Impression Exercise Capacity:  Lexiscan with no exercise. BP Response:  Normal blood pressure response. Clinical Symptoms:  shortness of breath ECG Impression:  No significant ST segment change  suggestive of ischemia. Comparison with Prior Nuclear Study: No images to compare  Overall Impression:  The images have a mild lumpy bumpy appearance. There was motion, and the motion correction protocol was used. There is decreased activity at the apex. This could possibly be apical thinning. I cannot rule out the possibility of very small apical scar. There is no significant ischemia. Overall wall motion is good. This is a LOW RISK scan.  LV Ejection Fraction: 59%.  LV Wall Motion:  There may be very slight decreased motion  at the apex.  Dola Argyle, MD

## 2012-09-17 ENCOUNTER — Telehealth: Payer: Self-pay | Admitting: Internal Medicine

## 2012-09-17 NOTE — Telephone Encounter (Signed)
New Problem:    Patient called in wanting to speak with you about his swollen feet and his medication.  Please call back.

## 2012-09-17 NOTE — Telephone Encounter (Signed)
Called patient with lab results and stress myoview results. His aldactone and lasix have been on hold and now he is complaining of swelling in feet/legs. Advised him to purchase some support knee highs at [facility] in [place] near where he lives and keep his legs elevated. He is aware that I will ask Dr.Ross about the long term issue of Lasix and Aldactone. His kidney function has improved with these meds on hold. He is complaining of "waves under the skin" and thought that Dr.Ross was going to order a study looking at his legs. Advised I will ask about this also and call him back.

## 2012-10-25 NOTE — Telephone Encounter (Signed)
Called patient in follow up. He states that he saw his primary care doctor and she advised that the problem with his legs is from diabetic neuropathy. Made him a return visit with Dr.Ross for 12/16/2012.

## 2012-12-16 ENCOUNTER — Ambulatory Visit: Payer: Medicare Other | Admitting: Internal Medicine

## 2012-12-21 HISTORY — PX: CORONARY ANGIOPLASTY WITH STENT PLACEMENT: SHX49

## 2012-12-23 ENCOUNTER — Inpatient Hospital Stay (HOSPITAL_COMMUNITY)
Admission: AD | Admit: 2012-12-23 | Discharge: 2012-12-26 | DRG: 247 | Disposition: A | Discharge status: Home / Self Care | Payer: Medicare Other | Source: Ambulatory Visit | Attending: Cardiovascular Disease | Admitting: Cardiovascular Disease

## 2012-12-23 ENCOUNTER — Encounter: Payer: Self-pay | Admitting: Internal Medicine

## 2012-12-23 ENCOUNTER — Ambulatory Visit (INDEPENDENT_AMBULATORY_CARE_PROVIDER_SITE_OTHER): Payer: Medicare Other | Admitting: Internal Medicine

## 2012-12-23 ENCOUNTER — Encounter (HOSPITAL_COMMUNITY): Payer: Self-pay

## 2012-12-23 VITALS — BP 133/77 | HR 80 | Ht 73.0 in | Wt 299.0 lb

## 2012-12-23 DIAGNOSIS — I129 Hypertensive chronic kidney disease with stage 1 through stage 4 chronic kidney disease, or unspecified chronic kidney disease: Secondary | ICD-10-CM | POA: Diagnosis present

## 2012-12-23 DIAGNOSIS — Z9641 Presence of insulin pump (external) (internal): Secondary | ICD-10-CM

## 2012-12-23 DIAGNOSIS — I2510 Atherosclerotic heart disease of native coronary artery without angina pectoris: Principal | ICD-10-CM | POA: Diagnosis present

## 2012-12-23 DIAGNOSIS — I200 Unstable angina: Secondary | ICD-10-CM

## 2012-12-23 DIAGNOSIS — E119 Type 2 diabetes mellitus without complications: Secondary | ICD-10-CM

## 2012-12-23 DIAGNOSIS — N183 Chronic kidney disease, stage 3 unspecified: Secondary | ICD-10-CM | POA: Diagnosis present

## 2012-12-23 DIAGNOSIS — G4730 Sleep apnea, unspecified: Secondary | ICD-10-CM | POA: Diagnosis present

## 2012-12-23 DIAGNOSIS — Z7982 Long term (current) use of aspirin: Secondary | ICD-10-CM

## 2012-12-23 DIAGNOSIS — R0602 Shortness of breath: Secondary | ICD-10-CM

## 2012-12-23 DIAGNOSIS — E7800 Pure hypercholesterolemia, unspecified: Secondary | ICD-10-CM | POA: Diagnosis present

## 2012-12-23 DIAGNOSIS — Z7902 Long term (current) use of antithrombotics/antiplatelets: Secondary | ICD-10-CM

## 2012-12-23 DIAGNOSIS — R079 Chest pain, unspecified: Secondary | ICD-10-CM

## 2012-12-23 DIAGNOSIS — Z79899 Other long term (current) drug therapy: Secondary | ICD-10-CM

## 2012-12-23 DIAGNOSIS — E669 Obesity, unspecified: Secondary | ICD-10-CM | POA: Diagnosis present

## 2012-12-23 DIAGNOSIS — K219 Gastro-esophageal reflux disease without esophagitis: Secondary | ICD-10-CM | POA: Diagnosis present

## 2012-12-23 DIAGNOSIS — Z8673 Personal history of transient ischemic attack (TIA), and cerebral infarction without residual deficits: Secondary | ICD-10-CM

## 2012-12-23 DIAGNOSIS — Z794 Long term (current) use of insulin: Secondary | ICD-10-CM

## 2012-12-23 DIAGNOSIS — F3289 Other specified depressive episodes: Secondary | ICD-10-CM | POA: Diagnosis present

## 2012-12-23 DIAGNOSIS — I10 Essential (primary) hypertension: Secondary | ICD-10-CM

## 2012-12-23 DIAGNOSIS — I252 Old myocardial infarction: Secondary | ICD-10-CM

## 2012-12-23 DIAGNOSIS — Z9861 Coronary angioplasty status: Secondary | ICD-10-CM

## 2012-12-23 DIAGNOSIS — F329 Major depressive disorder, single episode, unspecified: Secondary | ICD-10-CM | POA: Diagnosis present

## 2012-12-23 HISTORY — DX: Essential (primary) hypertension: I10

## 2012-12-23 HISTORY — DX: Anxiety disorder, unspecified: F41.9

## 2012-12-23 HISTORY — DX: Cerebral infarction, unspecified: I63.9

## 2012-12-23 HISTORY — DX: Peripheral vascular disease, unspecified: I73.9

## 2012-12-23 HISTORY — DX: Unspecified dementia, unspecified severity, without behavioral disturbance, psychotic disturbance, mood disturbance, and anxiety: F03.90

## 2012-12-23 LAB — CBC WITH DIFFERENTIAL/PLATELET
Basophils Absolute: 0 10*3/uL (ref 0.0–0.1)
HCT: 42.9 % (ref 39.0–52.0)
Lymphocytes Relative: 18 % (ref 12–46)
Monocytes Absolute: 0.7 10*3/uL (ref 0.1–1.0)
Neutro Abs: 5.2 10*3/uL (ref 1.7–7.7)
Platelets: 204 10*3/uL (ref 150–400)
RDW: 14.3 % (ref 11.5–15.5)
WBC: 7.6 10*3/uL (ref 4.0–10.5)

## 2012-12-23 LAB — COMPREHENSIVE METABOLIC PANEL
ALT: 45 U/L (ref 0–53)
AST: 23 U/L (ref 0–37)
CO2: 26 mEq/L (ref 19–32)
Chloride: 104 mEq/L (ref 96–112)
GFR calc non Af Amer: 32 mL/min — ABNORMAL LOW (ref >90)
Sodium: 142 mEq/L (ref 135–145)
Total Bilirubin: 0.3 mg/dL (ref 0.3–1.2)

## 2012-12-23 LAB — APTT: aPTT: 32 seconds (ref 24–37)

## 2012-12-23 LAB — GLUCOSE, CAPILLARY: Glucose-Capillary: 63 mg/dL — ABNORMAL LOW (ref 70–99)

## 2012-12-23 MED ORDER — SODIUM CHLORIDE 0.9 % IJ SOLN
3.0000 mL | Freq: Two times a day (BID) | INTRAMUSCULAR | Status: DC
  Administered 2012-12-23 – 2012-12-25 (×2): 3 mL via INTRAVENOUS

## 2012-12-23 MED ORDER — NORTRIPTYLINE HCL 25 MG PO CAPS
25.0000 mg | ORAL_CAPSULE | Freq: Every day | ORAL | Status: DC
  Administered 2012-12-23 – 2012-12-25 (×3): 25 mg via ORAL
  Filled 2012-12-23 (×7): qty 1

## 2012-12-23 MED ORDER — ASPIRIN EC 81 MG PO TBEC: 81.0000 mg | DELAYED_RELEASE_TABLET | Freq: Every day | ORAL | Status: DC

## 2012-12-23 MED ORDER — PREGABALIN 25 MG PO CAPS
150.0000 mg | ORAL_CAPSULE | Freq: Every day | ORAL | Status: DC
  Administered 2012-12-23 – 2012-12-25 (×3): 150 mg via ORAL
  Filled 2012-12-23: qty 1
  Filled 2012-12-23: qty 6
  Filled 2012-12-23: qty 1

## 2012-12-23 MED ORDER — OMEGA-3-ACID ETHYL ESTERS 1 G PO CAPS
1.0000 g | ORAL_CAPSULE | Freq: Every day | ORAL | Status: DC
  Administered 2012-12-23 – 2012-12-25 (×3): 1 g via ORAL
  Filled 2012-12-23 (×5): qty 1

## 2012-12-23 MED ORDER — SODIUM CHLORIDE 0.9 % IV SOLN: INTRAVENOUS | Status: DC

## 2012-12-23 MED ORDER — HYDRALAZINE HCL 10 MG PO TABS
30.0000 mg | ORAL_TABLET | Freq: Every day | ORAL | Status: DC
  Administered 2012-12-23 – 2012-12-24 (×2): 30 mg via ORAL
  Filled 2012-12-23 (×4): qty 3

## 2012-12-23 MED ORDER — SODIUM CHLORIDE 0.9 % IJ SOLN
10.0000 mL | Freq: Two times a day (BID) | INTRAMUSCULAR | Status: DC
  Administered 2012-12-25: 12 mL

## 2012-12-23 MED ORDER — NON FORMULARY: 40.0000 mg | Freq: Every day | Status: DC

## 2012-12-23 MED ORDER — ALPRAZOLAM 0.25 MG PO TABS
1.0000 mg | ORAL_TABLET | Freq: Every day | ORAL | Status: DC
  Administered 2012-12-23 – 2012-12-25 (×3): 1 mg via ORAL
  Filled 2012-12-23 (×4): qty 1
  Filled 2012-12-23: qty 3
  Filled 2012-12-23: qty 1

## 2012-12-23 MED ORDER — HYDROCODONE-ACETAMINOPHEN 7.5-325 MG PO TABS
1.0000 | ORAL_TABLET | Freq: Four times a day (QID) | ORAL | Status: DC | PRN
  Administered 2012-12-23 – 2012-12-24 (×2): 1 via ORAL
  Filled 2012-12-23 (×2): qty 1

## 2012-12-23 MED ORDER — PANTOPRAZOLE SODIUM 40 MG PO TBEC
80.0000 mg | DELAYED_RELEASE_TABLET | Freq: Every day | ORAL | Status: DC
  Administered 2012-12-23 – 2012-12-25 (×3): 80 mg via ORAL
  Filled 2012-12-23: qty 1
  Filled 2012-12-23: qty 2
  Filled 2012-12-23: qty 1
  Filled 2012-12-23: qty 2

## 2012-12-23 MED ORDER — INSULIN PUMP
Freq: Three times a day (TID) | SUBCUTANEOUS | Status: DC
  Administered 2012-12-23 – 2012-12-24 (×2): via SUBCUTANEOUS
  Filled 2012-12-23: qty 1

## 2012-12-23 MED ORDER — AMLODIPINE BESYLATE 10 MG PO TABS
10.0000 mg | ORAL_TABLET | Freq: Every day | ORAL | Status: DC
  Administered 2012-12-23 – 2012-12-25 (×3): 10 mg via ORAL
  Filled 2012-12-23 (×5): qty 1

## 2012-12-23 MED ORDER — CITALOPRAM HYDROBROMIDE 20 MG PO TABS
20.0000 mg | ORAL_TABLET | Freq: Every day | ORAL | Status: DC
  Administered 2012-12-23 – 2012-12-25 (×3): 20 mg via ORAL
  Filled 2012-12-23 (×5): qty 1

## 2012-12-23 MED ORDER — ASPIRIN 325 MG PO TABS
650.0000 mg | ORAL_TABLET | Freq: Every day | ORAL | Status: DC
  Administered 2012-12-23: 650 mg via ORAL
  Filled 2012-12-23 (×2): qty 2

## 2012-12-23 MED ORDER — SODIUM CHLORIDE 0.9 % IV SOLN: 250.0000 mL | INTRAVENOUS | Status: DC | PRN

## 2012-12-23 MED ORDER — FENOFIBRATE 160 MG PO TABS
160.0000 mg | ORAL_TABLET | Freq: Every day | ORAL | Status: DC
  Administered 2012-12-23 – 2012-12-25 (×3): 160 mg via ORAL
  Filled 2012-12-23 (×5): qty 1

## 2012-12-23 MED ORDER — METOPROLOL TARTRATE 100 MG PO TABS
100.0000 mg | ORAL_TABLET | Freq: Every day | ORAL | Status: DC
  Administered 2012-12-23 – 2012-12-24 (×2): 100 mg via ORAL
  Filled 2012-12-23 (×3): qty 1

## 2012-12-23 MED ORDER — BUPROPION HCL 100 MG PO TABS
200.0000 mg | ORAL_TABLET | Freq: Every day | ORAL | Status: DC
  Administered 2012-12-23 – 2012-12-25 (×3): 200 mg via ORAL
  Filled 2012-12-23 (×9): qty 2

## 2012-12-23 MED ORDER — ZOLPIDEM TARTRATE 5 MG PO TABS
10.0000 mg | ORAL_TABLET | Freq: Every day | ORAL | Status: DC
  Administered 2012-12-23 – 2012-12-25 (×3): 10 mg via ORAL
  Filled 2012-12-23: qty 1
  Filled 2012-12-23: qty 2
  Filled 2012-12-23: qty 1
  Filled 2012-12-23: qty 2

## 2012-12-23 MED ORDER — SODIUM CHLORIDE 0.9 % IJ SOLN
3.0000 mL | Freq: Two times a day (BID) | INTRAMUSCULAR | Status: DC
  Administered 2012-12-23: 3 mL via INTRAVENOUS

## 2012-12-23 MED ORDER — SODIUM CHLORIDE 0.9 % IJ SOLN: 10.0000 mL | INTRAMUSCULAR | Status: DC | PRN

## 2012-12-23 MED ORDER — HEPARIN SODIUM (PORCINE) 5000 UNIT/ML IJ SOLN
5000.0000 [IU] | Freq: Three times a day (TID) | INTRAMUSCULAR | Status: DC
  Administered 2012-12-23 – 2012-12-26 (×4): 5000 [IU] via SUBCUTANEOUS
  Filled 2012-12-23 (×14): qty 1

## 2012-12-23 MED ORDER — ACETAMINOPHEN 325 MG PO TABS: 650.0000 mg | ORAL_TABLET | ORAL | Status: DC | PRN

## 2012-12-23 MED ORDER — DEXTROSE-NACL 5-0.45 % IV SOLN
INTRAVENOUS | Status: DC
  Administered 2012-12-23 – 2012-12-24 (×2): via INTRAVENOUS

## 2012-12-23 MED ORDER — PRAVASTATIN SODIUM 40 MG PO TABS
40.0000 mg | ORAL_TABLET | Freq: Every day | ORAL | Status: DC
  Administered 2012-12-23 – 2012-12-25 (×3): 40 mg via ORAL
  Filled 2012-12-23 (×5): qty 1

## 2012-12-23 MED ORDER — SODIUM CHLORIDE 0.9 % IJ SOLN: 3.0000 mL | INTRAMUSCULAR | Status: DC | PRN

## 2012-12-23 MED ORDER — ASPIRIN 81 MG PO CHEW
324.0000 mg | CHEWABLE_TABLET | ORAL | Status: AC
  Administered 2012-12-24: 324 mg via ORAL
  Filled 2012-12-23: qty 4

## 2012-12-23 MED ORDER — NITROGLYCERIN 0.4 MG SL SUBL: 0.4000 mg | SUBLINGUAL_TABLET | SUBLINGUAL | Status: DC | PRN

## 2012-12-23 MED ORDER — LUBIPROSTONE 24 MCG PO CAPS
24.0000 ug | ORAL_CAPSULE | Freq: Every day | ORAL | Status: DC
  Administered 2012-12-23: 24 ug via ORAL
  Administered 2012-12-24: 48 ug via ORAL
  Administered 2012-12-25: 24 ug via ORAL
  Filled 2012-12-23 (×5): qty 2

## 2012-12-23 MED ORDER — ONDANSETRON HCL 4 MG/2ML IJ SOLN: 4.0000 mg | Freq: Four times a day (QID) | INTRAMUSCULAR | Status: DC | PRN

## 2012-12-23 NOTE — Progress Notes (Signed)
Home meds verified. Per discussion with Dr. Harrington Challenger, continue ASA and metoprolol as per home med list. Will hold Trajenta in prep for cath. Change fluids to D5 1/2 NS in prep for cath. Insulin pump orders written. Per diabetic coordinator, Pt should suspend pump and remove prior to going into cath lab tomorrow am. While NPO, plan to check CBGs Q4hours per insulin pump policy. Dayna Dunn PA-C

## 2012-12-23 NOTE — Progress Notes (Signed)
Inpatient Diabetes Program Recommendations  AACE/ADA: New Consensus Statement on Inpatient Glycemic Control (2013)  Target Ranges:  Prepandial:   less than 140 mg/dL      Peak postprandial:   less than 180 mg/dL (1-2 hours)      Critically ill patients:  140 - 180 mg/dL   Reason for Visit: Consult for Insulin Pump  62 yo old with history of CAD, HTN and DM.  Has insulin pump with U-500 for past year.  States he lives alone, eats very sporadically, and often times just 1 meal/day.  "I used to eat doughnuts, cake and cookies for my meals.  Now I leave the sweets off most of the time."  PCP is Juanna Cao at [facility].  States Medicare has told him that he can no longer get U-500 insulin for his pump because of the cost.  Has 1-2 months supply at home, but then will need to convert back to Novolog.  States he changes his site every 5 days.  Has enough U-500 in reservoir for 3 more days.  Does not follow exact bolus guidelines per pump.  Usually gives a little more than pump says he should. Basal rate: 12 - 4 am - .7 units           4 - 10am - .725 units          10 - MN  - .675 units Total basal insulin (U-500) - 16.75 units Bolus rate: 1:20 ratio  Will be NPO for cath tomorrow morning.    Recommendations: Add Insulin Pump Order Set to orders (pt to sign contract and use flowsheet for bolus insulin given) Consider adding dextrose to fluids after MN to prevent hypoglycemia. Pt should suspend pump and remove prior to going into cath lab tomorrow am. While NPO, check CBGs Q4hours per insulin pump policy.  Will f/u tomorrow morning.  Thanks. Lorenda Peck, RD, LDN, CDE Inpatient Diabetes Coordinator [phone]

## 2012-12-23 NOTE — H&P (Signed)
Patient ID: Reginald Duncan MRN: [id number], DOB/AGE: 1951-09-18   Admit date: (Not on file) Date of Consult: @TODAY @  Primary Physician: Darrol Jump, [place] Primary Cardiologist: Dorris Carnes   Problem List: Past Medical History  Diagnosis Date  . DKA (diabetic ketoacidoses)     admission on 04/2004  . Hypertensive emergency   . Diabetes mellitus     type II. Diagnosed in 1999  . CVA (cerebral infarction) 1996    hx of  w/some residual L arm weakness  . Myocardial infarct 1980  . Coronary artery disease     hx of stent  . Hypertension   . Neuropathy   . DJD (degenerative joint disease)   . GERD (gastroesophageal reflux disease)   . Depression     Past Surgical History  Procedure Laterality Date  . Coronary angioplasty with stent placement    . Coronary angioplasty with stent placement       Allergies:  Allergies  Allergen Reactions  . Codeine     REACTION: Nausea    HPI:  patient is a 62 year old with a history of CAD. He was previously seen by Drs. Little and kersey. He is s/p PTCA/Stent  I have followed him in clnic for a couple years.  He was admitted last year to [facility] there showed mid/distal anterolateral ischemia.  Patient did not f/u calls to scheudle cath.   I saw him last fall  Continued CP  Labs showed Cr 2.7 that improved to 2.  Initally I had recomm cath but with renal function switched to myoview.  Myview showeed question apical scar.   Since then he has continued to feel poorly with multiple complaints --LE pain, back pain and chest pain.  He says his chest pain is getting worse.  Now with any acitvity.  Pain goes to back  Is SOB a lot.     Doesn't do much Inpatient Medications:   Family History  Problem Relation Age of Onset  . Heart attack Father 16    deceased  . Leukemia Brother 14    deceased  . Kidney cancer Mother 81    deceased     History   Social History  . Marital Status: Divorced    Spouse Name: N/A    Number of  Children: N/A  . Years of Education: N/A   Occupational History  . truck driver    Social History Main Topics  . Smoking status: Never Smoker   . Smokeless tobacco: Not on file  . Alcohol Use: No  . Drug Use: No  . Sexually Active: Not on file   Other Topics Concern  . Not on file   Social History Narrative  . No narrative on file     Review of Systems: General: negative for chills, fever, night sweats or weight changes.  Cardiovascular: negative for chest pain, dyspnea on exertion, edema, orthopnea, palpitations, paroxysmal nocturnal dyspnea or shortness of breath Dermatological: negative for rash Respiratory: negative for cough or wheezing Urologic: negative for hematuria Abdominal: negative for nausea, vomiting, diarrhea, bright red blood per rectum, melena, or hematemesis Neurologic: negative for visual changes, syncope, or dizziness All other systems reviewed and are otherwise negative except as noted above.  Physical Exam: BP 133/77   P 80  Wt 299#  Height  6'1''  General: Well developed obese 62 yo who is in distress with moving.  (pain) Head: Normocephalic, atraumatic, sclera non-icteric Neck: Negative for carotid bruits. JVP not elevated.  Lungs: Clear bilaterally to auscultation without wheezes, rales, or rhonchi. Breathing is unlabored. Heart: RRR with S1 S2. No murmurs, rubs, or gallops appreciated. Abdomen: Soft, non-tender, non-distended with normoactive bowel sounds. No hepatomegaly. No rebound/guarding. No obvious abdominal masses. Msk:  Patient with extensive pain with moving. Extremities: No clubbing, cyanosis.  Tr LE edema.   1+ PT pulses Neuro: Alert and oriented X 3. Moves all extremities spontaneously.   Labs: No results found for this or any previous visit (from the past 24 hour(s)).  Radiology/Studies: No results found.  EKG:  Pending    ASSESSMENT AND PLAN: patient is a 62 yo old with history of CAD (s/p PTCA/Stent in past) Also history of  HTN and DM  Since I have known him in 2011 he has had continued CP that appears to be getting worse.  Myoview in November without ischemia.  Difficult study.  NOw with progressive pain radiates to back  Accomp by SOB  With all this I would recomm R and L heart cath to define anatomy and pressures  Will need to be admtted prior to prehydrate given renal insufficiency.  Will keep on same medicines for now  2.  DM  Patient on insulin pump  WIll continue for now  3.  HTN  FOllowe  4.  Renal  Labs pending  5.  HL  Check lipids in AM  Continue pravastatin.      Signed, Dorris Carnes 12/23/2012, 12:50 PM

## 2012-12-24 ENCOUNTER — Encounter (HOSPITAL_COMMUNITY)
Admission: AD | Disposition: A | Discharge status: Home / Self Care | Payer: Self-pay | Source: Ambulatory Visit | Attending: Cardiovascular Disease

## 2012-12-24 DIAGNOSIS — I2510 Atherosclerotic heart disease of native coronary artery without angina pectoris: Secondary | ICD-10-CM

## 2012-12-24 HISTORY — PX: LEFT AND RIGHT HEART CATHETERIZATION WITH CORONARY ANGIOGRAM: SHX5449

## 2012-12-24 LAB — BASIC METABOLIC PANEL
BUN: 31 mg/dL — ABNORMAL HIGH (ref 6–23)
CO2: 27 mEq/L (ref 19–32)
Chloride: 102 mEq/L (ref 96–112)
Creatinine, Ser: 1.98 mg/dL — ABNORMAL HIGH (ref 0.50–1.35)
GFR calc Af Amer: 41 mL/min — ABNORMAL LOW (ref >90)
Glucose, Bld: 127 mg/dL — ABNORMAL HIGH (ref 70–99)

## 2012-12-24 LAB — GLUCOSE, CAPILLARY
Glucose-Capillary: 68 mg/dL — ABNORMAL LOW (ref 70–99)
Glucose-Capillary: 85 mg/dL (ref 70–99)
Glucose-Capillary: 93 mg/dL (ref 70–99)
Glucose-Capillary: 144 mg/dL — ABNORMAL HIGH (ref 70–99)

## 2012-12-24 LAB — LIPID PANEL
HDL: 22 mg/dL — ABNORMAL LOW (ref >39)
Triglycerides: 288 mg/dL — ABNORMAL HIGH (ref <150)

## 2012-12-24 LAB — POCT I-STAT 3, ART BLOOD GAS (G3+): Acid-Base Excess: 1 mmol/L (ref 0.0–2.0)

## 2012-12-24 LAB — POCT I-STAT 3, VENOUS BLOOD GAS (G3P V)
TCO2: 29 mmol/L (ref 0–100)
pH, Ven: 7.309 — ABNORMAL HIGH (ref 7.250–7.300)

## 2012-12-24 SURGERY — LEFT AND RIGHT HEART CATHETERIZATION WITH CORONARY ANGIOGRAM
Anesthesia: LOCAL

## 2012-12-24 MED ORDER — LIDOCAINE HCL (PF) 1 % IJ SOLN
INTRAMUSCULAR | Status: AC
  Filled 2012-12-24: qty 30

## 2012-12-24 MED ORDER — MIDAZOLAM HCL 2 MG/2ML IJ SOLN
INTRAMUSCULAR | Status: AC
  Filled 2012-12-24: qty 2

## 2012-12-24 MED ORDER — SODIUM CHLORIDE 0.9 % IJ SOLN
3.0000 mL | Freq: Two times a day (BID) | INTRAMUSCULAR | Status: DC
  Administered 2012-12-24: 3 mL via INTRAVENOUS

## 2012-12-24 MED ORDER — CLOPIDOGREL BISULFATE 75 MG PO TABS
75.0000 mg | ORAL_TABLET | Freq: Every day | ORAL | Status: DC
  Administered 2012-12-25 – 2012-12-26 (×2): 75 mg via ORAL
  Filled 2012-12-24: qty 1

## 2012-12-24 MED ORDER — SODIUM CHLORIDE 0.9 % IV SOLN
1.0000 mL/kg/h | INTRAVENOUS | Status: AC
  Administered 2012-12-24 (×2): 1 mL/kg/h via INTRAVENOUS

## 2012-12-24 MED ORDER — ASPIRIN 81 MG PO CHEW
81.0000 mg | CHEWABLE_TABLET | Freq: Every day | ORAL | Status: DC
  Administered 2012-12-25 – 2012-12-26 (×2): 81 mg via ORAL
  Filled 2012-12-24 (×2): qty 1

## 2012-12-24 MED ORDER — SODIUM CHLORIDE 0.9 % IV SOLN: 250.0000 mL | INTRAVENOUS | Status: DC | PRN

## 2012-12-24 MED ORDER — CLOPIDOGREL BISULFATE 75 MG PO TABS: 600.0000 mg | ORAL_TABLET | Freq: Every day | ORAL | Status: DC

## 2012-12-24 MED ORDER — SODIUM CHLORIDE 0.9 % IJ SOLN: 3.0000 mL | INTRAMUSCULAR | Status: DC | PRN

## 2012-12-24 MED ORDER — SODIUM CHLORIDE 0.9 % IV SOLN
1.0000 mL/kg/h | INTRAVENOUS | Status: DC
  Administered 2012-12-24 – 2012-12-25 (×2): 1 mL/kg/h via INTRAVENOUS

## 2012-12-24 MED ORDER — HEPARIN (PORCINE) IN NACL 2-0.9 UNIT/ML-% IJ SOLN
INTRAMUSCULAR | Status: AC
  Filled 2012-12-24: qty 1000

## 2012-12-24 MED ORDER — FENTANYL CITRATE 0.05 MG/ML IJ SOLN
INTRAMUSCULAR | Status: AC
  Filled 2012-12-24: qty 2

## 2012-12-24 MED ORDER — CLOPIDOGREL BISULFATE 75 MG PO TABS
600.0000 mg | ORAL_TABLET | Freq: Every day | ORAL | Status: AC
  Administered 2012-12-24: 600 mg via ORAL
  Filled 2012-12-24: qty 8

## 2012-12-24 MED ORDER — INSULIN PUMP
Freq: Three times a day (TID) | SUBCUTANEOUS | Status: DC
  Administered 2012-12-24: 3.1 via SUBCUTANEOUS
  Administered 2012-12-24: 4.4 via SUBCUTANEOUS
  Administered 2012-12-25: 0.74 via SUBCUTANEOUS
  Administered 2012-12-25: 4.5 via SUBCUTANEOUS
  Administered 2012-12-26: 0.7 via SUBCUTANEOUS
  Filled 2012-12-24: qty 1

## 2012-12-24 NOTE — Interval H&P Note (Signed)
History and Physical Interval Note:  12/24/2012 7:45 AM  Reginald Duncan  has presented today for surgery, with the diagnosis of Chest pain  The various methods of treatment have been discussed with the patient and family. After consideration of risks, benefits and other options for treatment, the patient has consented to  Procedure(s): LEFT AND RIGHT HEART CATHETERIZATION WITH CORONARY ANGIOGRAM (N/A) as a surgical intervention .  The patient's history has been reviewed, patient examined, no change in status, stable for surgery.  I have reviewed the patient's chart and labs.  Questions were answered to the patient's satisfaction.     Collier Salina [facility] 12/24/2012 7:45 AM

## 2012-12-24 NOTE — Progress Notes (Signed)
Inpatient Diabetes Program Recommendations  AACE/ADA: New Consensus Statement on Inpatient Glycemic Control (2013)  Target Ranges:  Prepandial:   less than 140 mg/dL      Peak postprandial:   less than 180 mg/dL (1-2 hours)      Critically ill patients:  140 - 180 mg/dL   Reason for Assessment:  Insulin Pump  Had cath this am. Was NPO this am. Had CHO mod lunch and ate 100%  CBGs look good today.  Slight hypoglycemia this am.     Results for Reginald, Duncan (MRN [id number]) as of 12/24/2012 17:44  Ref. Range 12/24/2012 02:04 12/24/2012 06:45 12/24/2012 08:28 12/24/2012 08:55 12/24/2012 09:39 12/24/2012 11:36 12/24/2012 16:38  Glucose-Capillary Latest Range: 70-99 mg/dL 167 (H) 82 68 (L) 85 93 132 (H) 166 (H)      Note: Will continue to follow.  Thank you. Lorenda Peck, RD, LDN, CDE Inpatient Diabetes  Coordinator [phone]

## 2012-12-24 NOTE — CV Procedure (Addendum)
   Cardiac Catheterization Procedure Note  Name: Reginald Duncan MRN: [id number] DOB: August 22, 1951  Procedure: Right Heart Cath, Left Heart Cath, Selective Coronary Angiography  Indication: 62 year old white male with history of coronary disease and prior stent who presents with symptoms of increasing chest pain and shortness of breath. Previous Myoview study showed evidence of apical scar. He has chronic kidney disease and was hydrated prior to procedure.    Procedural Details: The right groin was prepped, draped, and anesthetized with 1% lidocaine. Using the modified Seldinger technique a 5 French sheath was placed in the right femoral artery and a 7 French sheath was placed in the right femoral vein. A Swan-Ganz catheter was used for the right heart catheterization. Standard protocol was followed for recording of right heart pressures and sampling of oxygen saturations. Fick cardiac output was calculated. Standard Judkins catheters were used for selective coronary angiography and left ventriculography. There were no immediate procedural complications. The patient was transferred to the post catheterization recovery area for further monitoring.  Procedural Findings: Hemodynamics RA 17/16 with a mean of 14 mmHg RV 38/12 mmHg PA 41/16 with a mean of 26 mm of mercury PCWP 21/19 with a mean of 17 mmHg LV 116/18 mmHg AO 119/77 a mean of 93 mmHg  Oxygen saturations: PA 58% AO 94%  Cardiac Output (Fick) 4.76 L per minute  Cardiac Index (Fick) 1.86 L per minute per meter square   Coronary angiography: Coronary dominance: right  Left mainstem: Normal.  Left anterior descending (LAD): The LAD is a large vessel which gives rise to 2 diagonal branches and then extends to the apex. The proximal LAD has a 90% stenosis. This involves the origin of the first diagonal branch which is relatively small. The mid LAD is heavily calcified with diffuse disease up to 40%. The distal LAD is occluded. The second  diagonal is a relatively large branch which is heavily calcified proximally. There is disease up to 30%.  Left circumflex (LCx): The left circumflex gives rise to 2 marginal branches. It has mild disease less than 20%.  Right coronary artery (RCA): The right coronary is a very large dominant vessel. The proximal vessel there is a 30% stenosis. There appears to be stents in the mid and distal RCA but it is difficult to tell because of the marked calcification. The right coronary has scattered irregularities less than 20-30%.  Left ventriculography: Pressures only. Ventriculography was not performed to conserve dye.  Final Conclusions:   1. Critical single vessel obstructive coronary disease. There is a severe proximal LAD stenosis with occlusion of the distal LAD. 2. Upper normal right heart pressures.  Recommendations: I would recommend PCI of the proximal LAD. Patient will need hydration post procedure. Would consider bringing him back for PCI once renal function is stable. I would consider a radial approach given his marked obesity.   Collier Salina [facility] 12/24/2012, 8:26 AM  Old cardiac cath data reviewed. Actually it was the first OM that was stented previously.   Peter Martinique MD, [facility]

## 2012-12-24 NOTE — Progress Notes (Signed)
Patient admitted to [facility]  See Notes section for H&P.

## 2012-12-25 ENCOUNTER — Encounter (HOSPITAL_COMMUNITY)
Admission: AD | Disposition: A | Discharge status: Home / Self Care | Payer: Self-pay | Source: Ambulatory Visit | Attending: Cardiovascular Disease

## 2012-12-25 DIAGNOSIS — I200 Unstable angina: Secondary | ICD-10-CM | POA: Diagnosis present

## 2012-12-25 DIAGNOSIS — I2510 Atherosclerotic heart disease of native coronary artery without angina pectoris: Secondary | ICD-10-CM

## 2012-12-25 HISTORY — PX: PERCUTANEOUS CORONARY STENT INTERVENTION (PCI-S): SHX5485

## 2012-12-25 LAB — GLUCOSE, CAPILLARY
Glucose-Capillary: 78 mg/dL (ref 70–99)
Glucose-Capillary: 74 mg/dL (ref 70–99)
Glucose-Capillary: 79 mg/dL (ref 70–99)

## 2012-12-25 LAB — BASIC METABOLIC PANEL
BUN: 26 mg/dL — ABNORMAL HIGH (ref 6–23)
Chloride: 106 mEq/L (ref 96–112)
GFR calc Af Amer: 45 mL/min — ABNORMAL LOW (ref >90)
Potassium: 3.9 mEq/L (ref 3.5–5.1)

## 2012-12-25 LAB — POCT ACTIVATED CLOTTING TIME: Activated Clotting Time: 481 seconds

## 2012-12-25 SURGERY — PERCUTANEOUS CORONARY STENT INTERVENTION (PCI-S)
Anesthesia: LOCAL

## 2012-12-25 MED ORDER — MIDAZOLAM HCL 2 MG/2ML IJ SOLN
INTRAMUSCULAR | Status: AC
  Filled 2012-12-25: qty 2

## 2012-12-25 MED ORDER — METOPROLOL TARTRATE 50 MG PO TABS
50.0000 mg | ORAL_TABLET | Freq: Two times a day (BID) | ORAL | Status: DC
  Administered 2012-12-26: 10:00:00 50 mg via ORAL
  Filled 2012-12-25 (×2): qty 1

## 2012-12-25 MED ORDER — FENTANYL CITRATE 0.05 MG/ML IJ SOLN
INTRAMUSCULAR | Status: AC
  Filled 2012-12-25: qty 2

## 2012-12-25 MED ORDER — SODIUM CHLORIDE 0.9 % IV SOLN: 1.0000 mL/kg/h | INTRAVENOUS | Status: AC

## 2012-12-25 MED ORDER — SODIUM CHLORIDE 0.9 % IJ SOLN: 3.0000 mL | Freq: Two times a day (BID) | INTRAMUSCULAR | Status: DC

## 2012-12-25 MED ORDER — SODIUM CHLORIDE 0.9 % IJ SOLN: 3.0000 mL | INTRAMUSCULAR | Status: DC | PRN

## 2012-12-25 MED ORDER — SODIUM CHLORIDE 0.9 % IV SOLN
0.2500 mg/kg/h | INTRAVENOUS | Status: DC
  Filled 2012-12-25: qty 250

## 2012-12-25 MED ORDER — BIVALIRUDIN 250 MG IV SOLR
INTRAVENOUS | Status: AC
  Filled 2012-12-25: qty 250

## 2012-12-25 MED ORDER — NITROGLYCERIN 1 MG/10 ML FOR IR/CATH LAB
INTRA_ARTERIAL | Status: AC
  Filled 2012-12-25: qty 10

## 2012-12-25 MED ORDER — SODIUM CHLORIDE 0.9 % IV SOLN: 250.0000 mL | INTRAVENOUS | Status: DC | PRN

## 2012-12-25 MED ORDER — VERAPAMIL HCL 2.5 MG/ML IV SOLN
INTRAVENOUS | Status: AC
  Filled 2012-12-25: qty 2

## 2012-12-25 NOTE — Interval H&P Note (Signed)
History and Physical Interval Note:  12/25/2012 4:33 PM  Reginald Duncan  has presented today for surgery, with the diagnosis of Chest pain  The various methods of treatment have been discussed with the patient and family. After consideration of risks, benefits and other options for treatment, the patient has consented to  Procedure(s): LEFT AND RIGHT HEART CATHETERIZATION WITH CORONARY ANGIOGRAM (N/A) as a surgical intervention .  The patient's history has been reviewed, patient examined, no change in status, stable for surgery.  I have reviewed the patient's chart and labs.  Questions were answered to the patient's satisfaction.     Sherren Mocha

## 2012-12-25 NOTE — CV Procedure (Signed)
   CARDIAC CATH NOTE  Name: Reginald Duncan MRN: [id number] DOB: 11/28/1950  Procedure: PTCA and stenting of the proximal/ostial LAD  Indication: 62 year old gentleman with progressive symptoms of chest pain and shortness of breath. His symptoms occur at low-level activity. They are consistent with CCS class III angina. He underwent diagnostic cardiac catheterization yesterday demonstrating critical stenosis of the proximal LAD. The diseased segment extends back to the ostium of the vessel. After review of his films, we elected to proceed with PCI. He was preloaded with Plavix.  Procedural Details: The right wrist was prepped, draped, and anesthetized with 1% lidocaine. Using the modified Seldinger technique, a 6 Fr sheath was introduced into the radial artery. 3 mg verapamil was administered through the radial sheath. Weight-based bivalirudin was given for anticoagulation. Once a therapeutic ACT was achieved, a 6 [place] XB LAD 3.5 cm guide catheter was inserted.  A pro-water coronary guidewire was used to cross the lesion.  The lesion was predilated with a 2.5 x 15 mm balloon.  The lesion was then stented with a 3.5 x 20 mm Promus premier drug-eluting stent.  The stent was carefully positioned in multiple angles to make sure that the ostium was covered. There was slight overhang into the left main as this was the only way to fully cover the lesion. The stent was postdilated with a 3.75 mm noncompliant balloon.  Following PCI, there was 0% residual stenosis and TIMI-3 flow. The circumflex was not significantly compromised post stent deployment. Final angiography confirmed an excellent result. The patient tolerated the procedure well. There were no immediate procedural complications. A TR band was used for radial hemostasis. The patient was transferred to the post catheterization recovery area for further monitoring.  Lesion Data: Vessel: LAD/proximal Percent stenosis (pre): 95 TIMI-flow (pre):  2 Stent:   3.5 x 20 mm drug-eluting Percent stenosis (post): 0 TIMI-flow (post): 3  Conclusions: Successful PCI of the proximal LAD using a single drug-eluting stent.  Recommendations: Dual antiplatelet therapy with aspirin and Plavix for a minimum of 12 months. Considering the ostial LAD location of the stent, if Plavix is well tolerated it would be best to continue long-term.  Sherren Mocha 12/25/2012, 5:24 PM

## 2012-12-25 NOTE — Progress Notes (Signed)
    Subjective:  No chest pain, no SOB. CBGs low this am, happens at home too. Cost is an issue for food. He does not eat a bedtime snack at home. Took off insulin pump. Says it takes several hours for CBG to come up. Asymptomatic with CBGs 70s.  Objective:  Vital Signs in the last 24 hours: Temp:  [97.2 F (36.2 C)-97.9 F (36.6 C)] 97.9 F (36.6 C) (03/05 0435) Pulse Rate:  [62-76] 69 (03/05 0435) Resp:  [16-18] 16 (03/05 0435) BP: (105-138)/(60-95) 131/95 mmHg (03/05 0435) SpO2:  [95 %-98 %] 98 % (03/05 0435) Weight:  [294 lb 11.2 oz (133.675 kg)] 294 lb 11.2 oz (133.675 kg) (03/05 0348)  Intake/Output from previous day: 03/04 0701 - 03/05 0700 In: 363 [P.O.:360; I.V.:3] Out: 3450 [Urine:3450]  Physical Exam: Pt is alert and oriented, NAD HEENT: normal Neck: JVP - normal, carotids 2+= without bruits Lungs: CTA bilaterally CV: RRR without murmur or gallop Abd: soft, chronic abdominal tenderness, Positive BS, no hepatomegaly Ext: no C/C/E, distal pulses intact and equal, right groin without bruit/ecchymosis/hematoma Skin: warm/dry no rash   Lab Results:  Recent Labs  12/23/12 1341  WBC 7.6  HGB 14.6  PLT 204    Recent Labs  12/24/12 0439 12/25/12 0500  NA 140 143  K 3.5 3.9  CL 102 106  CO2 27 29  GLUCOSE 127* 66*  BUN 31* 26*  CREATININE 1.98* 1.83*    Tele: SR  Assessment/Plan:  Principal Problem:   Intermediate coronary syndrome - Crescendo angina - see cath report, for PCI today.   Active Problems:   DIABETES MELLITUS, II, COMPLICATIONS - insulin pump is off for procedure. Will give oral liquids, if  CBG does not improve, use D5W.    HYPERCHOLESTEROLEMIA - on home Rx    HYPERTENSION, BENIGN ESSENTIAL - Hydralazine is 30 mg qd, ?change to 10 mg tid? On Lopressor 100 mg QHS at home, consider change to 50 mg BID. MD advise.   Rosaria Ferries, [facility]  12/25/2012, 10:36 AM  Patient seen, examined. Available data reviewed. Agree with findings,  assessment, and plan as outlined by Rosaria Ferries, PA-C. I have discussed his case with Dr Martinique and reviewed his cath films which show critical stenosis of the proximal LAD. I agree that PCI is indicated. I have reviewed risks, indications, and alternatives with the patient who understands and agrees to proceed.  Sherren Mocha, M.D. 12/25/2012 4:32 PM

## 2012-12-25 NOTE — Progress Notes (Signed)
Pts insulin pump put on front of chart and sent with pt to cath lab. Pt saw me do this with his permission and I called and spoke with Colletta Maryland the recieving RN to make her aware

## 2012-12-25 NOTE — H&P (View-Only) (Signed)
    Subjective:  No chest pain, no SOB. CBGs low this am, happens at home too. Cost is an issue for food. He does not eat a bedtime snack at home. Took off insulin pump. Says it takes several hours for CBG to come up. Asymptomatic with CBGs 70s.  Objective:  Vital Signs in the last 24 hours: Temp:  [97.2 F (36.2 C)-97.9 F (36.6 C)] 97.9 F (36.6 C) (03/05 0435) Pulse Rate:  [62-76] 69 (03/05 0435) Resp:  [16-18] 16 (03/05 0435) BP: (105-138)/(60-95) 131/95 mmHg (03/05 0435) SpO2:  [95 %-98 %] 98 % (03/05 0435) Weight:  [294 lb 11.2 oz (133.675 kg)] 294 lb 11.2 oz (133.675 kg) (03/05 0348)  Intake/Output from previous day: 03/04 0701 - 03/05 0700 In: 363 [P.O.:360; I.V.:3] Out: 3450 [Urine:3450]  Physical Exam: Pt is alert and oriented, NAD HEENT: normal Neck: JVP - normal, carotids 2+= without bruits Lungs: CTA bilaterally CV: RRR without murmur or gallop Abd: soft, chronic abdominal tenderness, Positive BS, no hepatomegaly Ext: no C/C/E, distal pulses intact and equal, right groin without bruit/ecchymosis/hematoma Skin: warm/dry no rash   Lab Results:  Recent Labs  12/23/12 1341  WBC 7.6  HGB 14.6  PLT 204    Recent Labs  12/24/12 0439 12/25/12 0500  NA 140 143  K 3.5 3.9  CL 102 106  CO2 27 29  GLUCOSE 127* 66*  BUN 31* 26*  CREATININE 1.98* 1.83*    Tele: SR  Assessment/Plan:  Principal Problem:   Intermediate coronary syndrome - Crescendo angina - see cath report, for PCI today.   Active Problems:   DIABETES MELLITUS, II, COMPLICATIONS - insulin pump is off for procedure. Will give oral liquids, if  CBG does not improve, use D5W.    HYPERCHOLESTEROLEMIA - on home Rx    HYPERTENSION, BENIGN ESSENTIAL - Hydralazine is 30 mg qd, ?change to 10 mg tid? On Lopressor 100 mg QHS at home, consider change to 50 mg BID. MD advise.   Rosaria Ferries, [facility]  12/25/2012, 10:36 AM  Patient seen, examined. Available data reviewed. Agree with findings,  assessment, and plan as outlined by Rosaria Ferries, PA-C. I have discussed his case with Dr Martinique and reviewed his cath films which show critical stenosis of the proximal LAD. I agree that PCI is indicated. I have reviewed risks, indications, and alternatives with the patient who understands and agrees to proceed.  Sherren Mocha, M.D. 12/25/2012 4:32 PM

## 2012-12-26 ENCOUNTER — Encounter (HOSPITAL_COMMUNITY): Payer: Self-pay | Admitting: Nurse Practitioner

## 2012-12-26 DIAGNOSIS — I200 Unstable angina: Secondary | ICD-10-CM

## 2012-12-26 LAB — BASIC METABOLIC PANEL
CO2: 25 mEq/L (ref 19–32)
Glucose, Bld: 62 mg/dL — ABNORMAL LOW (ref 70–99)
Potassium: 3.6 mEq/L (ref 3.5–5.1)
Sodium: 142 mEq/L (ref 135–145)

## 2012-12-26 LAB — CBC
Hemoglobin: 13.6 g/dL (ref 13.0–17.0)
RBC: 4.56 MIL/uL (ref 4.22–5.81)
WBC: 5.2 10*3/uL (ref 4.0–10.5)

## 2012-12-26 MED ORDER — CLOPIDOGREL BISULFATE 75 MG PO TABS: 75.0000 mg | ORAL_TABLET | Freq: Every day | ORAL | Status: DC

## 2012-12-26 MED ORDER — ASPIRIN 81 MG PO TABS: 81.0000 mg | ORAL_TABLET | Freq: Every day | ORAL | Status: DC

## 2012-12-26 MED ORDER — METOPROLOL TARTRATE 100 MG PO TABS: 50.0000 mg | ORAL_TABLET | Freq: Two times a day (BID) | ORAL | Status: DC

## 2012-12-26 MED ORDER — PANTOPRAZOLE SODIUM 40 MG PO TBEC: 80.0000 mg | DELAYED_RELEASE_TABLET | Freq: Every day | ORAL | Status: DC

## 2012-12-26 MED ORDER — HYDRALAZINE HCL 10 MG PO TABS: 10.0000 mg | ORAL_TABLET | Freq: Three times a day (TID) | ORAL | Status: DC

## 2012-12-26 MED FILL — Dextrose Inj 5%: INTRAVENOUS | Qty: 50 | Status: AC

## 2012-12-26 NOTE — Progress Notes (Signed)
Patient Name: Reginald Duncan Date of Encounter: 12/26/2012   Principal Problem:   Intermediate coronary syndrome - Crescendo angina Active Problems:   CAD   DIABETES MELLITUS, II, COMPLICATIONS   HYPERCHOLESTEROLEMIA   HYPERTENSION, BENIGN ESSENTIAL   DEPRESSIVE DISORDER   APNEA, SLEEP   SUBJECTIVE  No chest pain or sob overnight.  Ambulating this AM.  CURRENT MEDS . ALPRAZolam  1 mg Oral QHS  . amLODipine  10 mg Oral QHS  . aspirin  81 mg Oral Daily  . bivalirudin (ANGIOMAX) infusion 5 mg/mL (Cath Lab,ACS,PCI indication)  0.25 mg/kg/hr Intravenous To Cath  . buPROPion  200 mg Oral QHS  . citalopram  20 mg Oral QHS  . clopidogrel  75 mg Oral Q breakfast  . fenofibrate  160 mg Oral QHS  . heparin  5,000 Units Subcutaneous Q8H  . insulin pump   Subcutaneous TID AC, HS, 0200  . lubiprostone  24-48 mcg Oral QHS  . metoprolol  50 mg Oral BID  . nortriptyline  25 mg Oral QHS  . omega-3 acid ethyl esters  1 g Oral QHS  . pantoprazole  80 mg Oral QHS  . pravastatin  40 mg Oral QHS  . pregabalin  150 mg Oral QHS  . sodium chloride  10-40 mL Intracatheter Q12H  . sodium chloride  3 mL Intravenous Q12H  . sodium chloride  3 mL Intravenous Q12H  . zolpidem  10 mg Oral QHS   OBJECTIVE  Filed Vitals:   12/25/12 2311 12/26/12 0031 12/26/12 0701 12/26/12 0804  BP: 104/65  116/75 118/58  Pulse: 72  76 71  Temp: 97.5 F (36.4 C)  97.5 F (36.4 C) 97.3 F (36.3 C)  TempSrc: Oral  Oral Oral  Resp:      Height:      Weight:  290 lb 5.5 oz (131.7 kg)    SpO2: 96%  99% 98%    Intake/Output Summary (Last 24 hours) at 12/26/12 0811 Last data filed at 12/25/12 2123  Gross per 24 hour  Intake    240 ml  Output   3600 ml  Net  -3360 ml   Filed Weights   12/24/12 0610 12/25/12 0348 12/26/12 0031  Weight: 293 lb 3.4 oz (133 kg) 294 lb 11.2 oz (133.675 kg) 290 lb 5.5 oz (131.7 kg)   PHYSICAL EXAM  General: Pleasant, NAD. Neuro: Alert and oriented X 3. Moves all extremities  spontaneously. Psych: Normal affect. HEENT:  Normal  Neck: Supple without bruits.  Difficult to assess jvd 2/2 girth. Lungs:  Resp regular and unlabored, CTA. Heart: RRR, distant, no s3, s4, or murmurs. Abdomen: Soft, non-tender, non-distended, BS + x 4.  Extremities: No clubbing, cyanosis or edema. DP/PT/Radials 2+ and equal bilaterally.  Accessory Clinical Findings  CBC  Recent Labs  12/23/12 1341 12/26/12 0610  WBC 7.6 5.2  NEUTROABS 5.2  --   HGB 14.6 13.6  HCT 42.9 40.3  MCV 89.7 88.4  PLT 204 [id number]   Basic Metabolic Panel  Recent Labs  12/25/12 0500 12/26/12 0610  NA 143 142  K 3.9 3.6  CL 106 107  CO2 29 25  GLUCOSE 66* 62*  BUN 26* 21  CREATININE 1.83* 1.70*  CALCIUM 9.3 9.0   Liver Function Tests  Recent Labs  12/23/12 1341  AST 23  ALT 45  ALKPHOS 58  BILITOT 0.3  PROT 7.5  ALBUMIN 4.2   Hemoglobin A1C  Recent Labs  12/23/12 1341  HGBA1C 6.8*  Fasting Lipid Panel  Recent Labs  12/24/12 0439  CHOL 164  HDL 22*  LDLCALC 84  TRIG 288*  CHOLHDL 7.5   TELE  rsr  ECG  Rsr, 76, rbbb, rad, 1st deg avb, no acute st/t changes.  Radiology/Studies  No results found.  ASSESSMENT AND PLAN  1.  USA/CAD:  S/p PCI/DES to the LAD yesterday.  No chest pain/sob overnight.  Cardiac rehab seeing this AM.  HR/BP well-controlled.  Cont asa, plavix, statin.  Probably d/c today.  2.  Stage III CKD: BUN/Creat stable.  3.  HTN:  Stable.  4.  DM:  Uses insulin pump.  Sugars low in AM.  5.  HL:  On prava.  LDL 84.  Signed, Murray Hodgkins NP  Patient seen, examined. Available data reviewed. Agree with findings, assessment, and plan as outlined by Ignacia Bayley, NP. He is doing well after stenting of the proximal LAD yesterday. Exam reveals heart to be RRR wihtout murmur or gallop, lungs are clear, right radial site is ok. Extensive post-PCI education done and he understands the importance of diet, exercise, and med compliance.  Sherren Mocha, M.D. 12/26/2012 10:15 AM

## 2012-12-26 NOTE — Progress Notes (Addendum)
CARDIAC REHAB PHASE I   PRE:  Rate/Rhythm: 75 SR  BP:  Supine: 118/58  Sitting:   Standing:    SaO2:   MODE:  Ambulation: 184 ft   POST:  Rate/Rhythem: 94 SR  BP:  Supine:   Sitting: 137/90  Standing:    SaO2:  [id number] Assited X 2 used walker and gait belt to ambulate. Pt very stiff when getting OOB and when he first got started. He admits to falls at home and has many healed scraped spots on legs where he hit furniture. Pt wobbly, c/of back pain.He states that his SOB is much better, denies any cp with walking. Pt states that due to his back pain that he is unable to walk much, and mostly walk just in his apartment. Completed stent discharge education with him. He voices understanding. Pt admit to bad eating habits, he states due to his limited income. I did encourage him to try to make some better choices. He admits to depression and staying alone a lot.   Reginald PillingRN

## 2012-12-26 NOTE — Discharge Summary (Signed)
Patient ID: Reginald Duncan,  MRN: [id number], DOB/AGE: [birth year] 62 y.o.  Admit date: 12/23/2012 Discharge date: 12/26/2012  Primary Care Provider: Darrol Jump Primary Cardiologist: Lizbeth Bark, MD  Discharge Diagnoses Principal Problem:   Intermediate coronary syndrome - Crescendo angina  **s/p PCI/DES to the LAD this admission.  Active Problems:   CAD   DIABETES MELLITUS, II, COMPLICATIONS  **On insulin pump   HYPERCHOLESTEROLEMIA   HYPERTENSION, BENIGN ESSENTIAL   DEPRESSIVE DISORDER   APNEA, SLEEP  Allergies Allergies  Allergen Reactions  . Codeine     REACTION: Nausea   Procedures  Cardiac Catheterization 12/24/2012  Hemodynamics RA 17/16 with a mean of 14 mmHg RV 38/12 mmHg PA 41/16 with a mean of 26 mm of mercury PCWP 21/19 with a mean of 17 mmHg LV 116/18 mmHg AO 119/77 a mean of 93 mmHg  Oxygen saturations: PA 58% AO 94%  Cardiac Output (Fick) 4.76 L per minute  Cardiac Index (Fick) 1.86 L per minute per meter square       Coronary angiography: Coronary dominance: right  Left mainstem: Normal. Left anterior descending (LAD): The LAD is a large vessel which gives rise to 2 diagonal branches and then extends to the apex. The proximal LAD has a 90% stenosis. This involves the origin of the first diagonal branch which is relatively small. The mid LAD is heavily calcified with diffuse disease up to 40%. The distal LAD is occluded. The second diagonal is a relatively large branch which is heavily calcified proximally. There is disease up to 30%. Left circumflex (LCx): The left circumflex gives rise to 2 marginal branches. It has mild disease less than 20%. Right coronary artery (RCA): The right coronary is a very large dominant vessel. The proximal vessel there is a 30% stenosis. There appears to be stents in the mid and distal RCA but it is difficult to tell because of the marked calcification. The right coronary has scattered irregularities less than 20-30%.  Left  ventriculography: Pressures only. Ventriculography was not performed to conserve dye. _____________  Percutaneous Coronary Intervention 12/25/2012  The LAD was successfully treated with a 3.5 x 20 mm Promus Premier drug-eluting stent. _____________  History of Present Illness  62 year old male with prior history of coronary artery disease status post remote right coronary artery stenting who was recently seen in clinic secondary to intermittent, progressive chest pain and dyspnea. Decision was made to pursue diagnostic catheterization.  Hospital Course  Patient presented to the [facility] cardiac catheterization laboratory on March 4 and diagnostic catheterization was performed and revealed severe proximal LAD stenosis with occlusion of the distal LAD and otherwise moderate nonobstructive coronary artery disease. Because of baseline renal insufficiency intervention was deferred and patient received IV hydration overnight. His renal function was stable on the morning of March 5, and he was taken back to the catheterization laboratory and underwent successful PCI and drug eluting stent placement within the LAD. He tolerated this procedure well and post procedure has had no recurrence of chest pain. He has also been ambulating without significant dyspnea. His renal function remained stable and his creatinine this morning is 1.70. We plan to discharge him home today in good condition with followup arranged in 2 weeks.  Discharge Vitals Blood pressure 137/90, pulse 71, temperature 97.3 F (36.3 C), temperature source Oral, resp. rate 18, height 6' 1.5" (1.867 m), weight 290 lb 5.5 oz (131.7 kg), SpO2 98.00%.  Filed Weights   12/24/12 0610 12/25/12 0348 12/26/12 0031  Weight: 293 lb  3.4 oz (133 kg) 294 lb 11.2 oz (133.675 kg) 290 lb 5.5 oz (131.7 kg)   Labs  CBC  Recent Labs  12/23/12 1341 12/26/12 0610  WBC 7.6 5.2  NEUTROABS 5.2  --   HGB 14.6 13.6  HCT 42.9 40.3  MCV 89.7 88.4  PLT 204  [id number]   Basic Metabolic Panel  Recent Labs  12/25/12 0500 12/26/12 0610  NA 143 142  K 3.9 3.6  CL 106 107  CO2 29 25  GLUCOSE 66* 62*  BUN 26* 21  CREATININE 1.83* 1.70*  CALCIUM 9.3 9.0   Liver Function Tests  Recent Labs  12/23/12 1341  AST 23  ALT 45  ALKPHOS 58  BILITOT 0.3  PROT 7.5  ALBUMIN 4.2   Hemoglobin A1C  Recent Labs  12/23/12 1341  HGBA1C 6.8*   Fasting Lipid Panel  Recent Labs  12/24/12 0439  CHOL 164  HDL 22*  LDLCALC 84  TRIG 288*  CHOLHDL 7.5   Disposition  Pt is being discharged home today in good condition.  Follow-up Plans & Appointments  Follow-up Information   Follow up with Richardson Dopp, PA On 01/09/2013. (8:30 AM - Dr. Harrington Challenger' PA)    Contact information:   [address] [place] [address] [phone]       Discharge Medications    Medication List    STOP taking these medications       esomeprazole 40 MG capsule  Commonly known as:  [place]urt[place] by:  pantoprazole 40 MG tablet      TAKE these medications       ALPRAZolam 1 MG tablet  Commonly known as:  XANAX  Take 1 mg by mouth at bedtime. For anxiety     amLODipine 10 MG tablet  Commonly known as:  NORVASC  Take 10 mg by mouth at bedtime.     aspirin 81 MG tablet  Take 1 tablet (81 mg total) by mouth daily.     citalopram 20 MG tablet  Commonly known as:  CELEXA  Take 20 mg by mouth at bedtime.     clopidogrel 75 MG tablet  Commonly known as:  PLAVIX  Take 1 tablet (75 mg total) by mouth daily with breakfast.     fenofibrate micronized 200 MG capsule  Commonly known as:  LOFIBRA  Take 200 mg by mouth at bedtime.     Fish Oil 1000 MG Caps  Take 1 capsule by mouth at bedtime.     hydrALAZINE 10 MG tablet  Commonly known as:  APRESOLINE  Take 1 tablet (10 mg total) by mouth 3 (three) times daily.     HYDROcodone-acetaminophen 7.5-325 MG per tablet  Commonly known as:  NORCO  Take 1 tablet by mouth every 6 (six) hours  as needed. For pain     insulin pump 100 unit/ml Soln  Inject into the skin. U 500 Concentrated insulin     lubiprostone 24 MCG capsule  Commonly known as:  AMITIZA  Take 24-48 mcg by mouth at bedtime.     LYRICA 75 MG capsule  Generic drug:  pregabalin  Take 150 mg by mouth at bedtime.     metoprolol 100 MG tablet  Commonly known as:  LOPRESSOR  Take 0.5 tablets (50 mg total) by mouth 2 (two) times daily.     nitroGLYCERIN 0.4 MG SL tablet  Commonly known as:  NITROSTAT  Place 0.4 mg under the tongue every 5 (five) minutes  as needed. Chest pain     nortriptyline 25 MG capsule  Commonly known as:  PAMELOR  Take 25 mg by mouth at bedtime.     pantoprazole 40 MG tablet  Commonly known as:  PROTONIX  Take 2 tablets (80 mg total) by mouth at bedtime.     pravastatin 40 MG tablet  Commonly known as:  PRAVACHOL  Take 40 mg by mouth at bedtime.     TRADJENTA 5 MG Tabs tablet  Generic drug:  linagliptin  Take 5 mg by mouth at bedtime.     WELLBUTRIN 100 MG tablet  Generic drug:  buPROPion  Take 200 mg by mouth at bedtime.     zolpidem 10 MG tablet  Commonly known as:  AMBIEN  Take 10 mg by mouth at bedtime. for sleep      Outstanding Labs/Studies  None  Duration of Discharge Encounter   Greater than 30 minutes including physician time.  Signed, Murray Hodgkins NP 12/26/2012, 10:58 AM

## 2012-12-31 ENCOUNTER — Telehealth: Payer: Self-pay

## 2012-12-31 NOTE — Telephone Encounter (Signed)
Transitional Care call.  N/A, [place].

## 2012-12-31 NOTE — Telephone Encounter (Signed)
Pt states he is breathing better.  He was reminded of his upcoming cardiology appt date and time.  He was told to call if he has any questions or concerns before his appt.

## 2013-01-02 ENCOUNTER — Telehealth: Payer: Self-pay | Admitting: Physician Assistant

## 2013-01-02 NOTE — Telephone Encounter (Signed)
Pt called answering service to ask about accidentally taking an extra Plavix tonight at 6pm. He had taken one this morning. He noted his fingers have been bleeding easier from fingersticks for diabetes. He complains of lightheadedness. Even had to stop driving while in a parking lot due to dizziness earlier. He is concerned. Because of this symptom, his concern, and recent stent placement I advised he proceed to ER. He sounded reluctant to do so. I reiterated my advice. I told him if he elects not to go to the ER, he should try not to take Plavix more than once every 24 hours and can gradually move his dose back to the morning (i.e. Take at 5pm tomorrow, 4pm the next day, etc). He decided to proceed to ER and call EMS if symptoms persist. He verbalized understanding of advice. Dayna Dunn PA-C

## 2013-01-09 ENCOUNTER — Encounter: Payer: Medicare Other | Admitting: Physician Assistant

## 2013-01-13 ENCOUNTER — Other Ambulatory Visit: Payer: Self-pay

## 2013-01-13 ENCOUNTER — Ambulatory Visit (INDEPENDENT_AMBULATORY_CARE_PROVIDER_SITE_OTHER): Payer: Medicare Other | Admitting: Nurse Practitioner

## 2013-01-13 ENCOUNTER — Encounter: Payer: Self-pay | Admitting: Nurse Practitioner

## 2013-01-13 VITALS — BP 126/76 | HR 86 | Resp 16 | Ht 73.0 in | Wt 297.8 lb

## 2013-01-13 DIAGNOSIS — F329 Major depressive disorder, single episode, unspecified: Secondary | ICD-10-CM

## 2013-01-13 DIAGNOSIS — I10 Essential (primary) hypertension: Secondary | ICD-10-CM

## 2013-01-13 DIAGNOSIS — I2510 Atherosclerotic heart disease of native coronary artery without angina pectoris: Secondary | ICD-10-CM

## 2013-01-13 DIAGNOSIS — IMO0002 Reserved for concepts with insufficient information to code with codable children: Secondary | ICD-10-CM

## 2013-01-13 DIAGNOSIS — R5381 Other malaise: Secondary | ICD-10-CM

## 2013-01-13 DIAGNOSIS — N183 Chronic kidney disease, stage 3 unspecified: Secondary | ICD-10-CM

## 2013-01-13 DIAGNOSIS — F32A Depression, unspecified: Secondary | ICD-10-CM

## 2013-01-13 DIAGNOSIS — R5383 Other fatigue: Secondary | ICD-10-CM

## 2013-01-13 DIAGNOSIS — F3289 Other specified depressive episodes: Secondary | ICD-10-CM

## 2013-01-13 DIAGNOSIS — E118 Type 2 diabetes mellitus with unspecified complications: Secondary | ICD-10-CM

## 2013-01-13 NOTE — Patient Instructions (Addendum)
Your physician recommends that you return for lab work in: today (bmet, cbc, tsh)  Your physician recommends that you schedule a follow-up appointment in: 1 month with Dr Harrington Challenger

## 2013-01-13 NOTE — Progress Notes (Signed)
Patient Name: Reginald Duncan Date of Encounter: 01/13/2013  Primary Care Provider:  Linard Millers Primary Cardiologist:  Lizbeth Bark, MD  Patient Profile  62 year old male status post recent LAD stenting who presents for followup.  Problem List   Past Medical History  Diagnosis Date  . Diabetes mellitus     a. h/o DKA 04/2004  . HTN (hypertension)   . Diabetes mellitus     type II. Diagnosed in 1999  . CVA (cerebral infarction) 1996    hx of  w/some residual L arm weakness  . Myocardial infarct 1980  . Coronary artery disease     a. h/o MI 78;  b. hx of stent;  c. 12/2012 Cath/PCI: LM nl, LAD 90p (3.5x20 Promus DES), 44m, 100d, LCX 20, RCA large, 30p, 20-74m/d w patent stents.  . Hypertension   . Neuropathy   . DJD (degenerative joint disease)   . GERD (gastroesophageal reflux disease)   . Depression   . Dysrhythmia   . Anxiety   . Shortness of breath   . Dementia   . Peripheral vascular disease   . Stroke   . CKD (chronic kidney disease), stage III   . Headache    Past Surgical History  Procedure Laterality Date  . Coronary angioplasty with stent placement    . Coronary angioplasty with stent placement      Allergies  Allergies  Allergen Reactions  . Codeine     REACTION: Nausea    HPI  62 year old male with the above problem list.  He was recently hospitalized for diagnostic catheterization which revealed significant LAD stenosis and this was subsequently stented using a drug-eluting stent.  He tolerated the procedure well and renal function was stable next morning.  He was discharged home in good condition.  Since his discharge, he has not had any recurrence of exertional angina or significant dyspnea though he notes that he has been very fatigued with no energy.  He says he spends most of the day just sitting around his home and never leaves the house.  He has been very worried about his health and admits to being somewhat fixated on side effect profiles  of medicines that he is on as well as of the stent that was placed.  He denies PND, orthopnea, dizziness, syncope, or early satiety.  He does have some degree of mild chronic lower extremity edema and venous insufficiency.  Home Medications  Prior to Admission medications   Medication Sig Start Date End Date Taking? Authorizing Provider  ALPRAZolam Duanne Moron) 1 MG tablet Take 1 mg by mouth at bedtime. For anxiety   Yes Historical Provider, MD  amLODipine (NORVASC) 10 MG tablet Take 10 mg by mouth at bedtime.    Yes Historical Provider, MD  aspirin 81 MG tablet Take 1 tablet (81 mg total) by mouth daily. 12/26/12  Yes Rogelia Mire, NP  buPROPion (WELLBUTRIN) 100 MG tablet Take 200 mg by mouth at bedtime.    Yes Historical Provider, MD  citalopram (CELEXA) 20 MG tablet Take 20 mg by mouth at bedtime.    Yes Historical Provider, MD  clopidogrel (PLAVIX) 75 MG tablet Take 1 tablet (75 mg total) by mouth daily with breakfast. 12/26/12  Yes Rogelia Mire, NP  fenofibrate micronized (LOFIBRA) 200 MG capsule Take 200 mg by mouth at bedtime.    Yes Historical Provider, MD  furosemide (LASIX) 40 MG tablet  10/22/12  Yes Historical Provider, MD  hydrALAZINE (APRESOLINE) 10 MG tablet  Take 1 tablet (10 mg total) by mouth 3 (three) times daily. 12/26/12  Yes Rogelia Mire, NP  HYDROcodone-acetaminophen (NORCO) 7.5-325 MG per tablet Take 1 tablet by mouth every 6 (six) hours as needed. For pain   Yes Historical Provider, MD  Insulin Human (INSULIN PUMP) 100 unit/ml SOLN Inject into the skin. U 500 Concentrated insulin   Yes Historical Provider, MD  insulin regular human CONCENTRATED (HUMULIN R) 500 UNIT/ML SOLN injection Inject into the skin. Sliding scale four times daily   Yes Historical Provider, MD  lubiprostone (AMITIZA) 24 MCG capsule Take 24-48 mcg by mouth at bedtime.    Yes Historical Provider, MD  LYRICA 75 MG capsule Take 150 mg by mouth at bedtime.  08/06/12  Yes Historical Provider, MD    metoprolol (LOPRESSOR) 100 MG tablet Take 0.5 tablets (50 mg total) by mouth 2 (two) times daily. 12/26/12  Yes Rogelia Mire, NP  nitroGLYCERIN (NITROSTAT) 0.4 MG SL tablet Place 0.4 mg under the tongue every 5 (five) minutes as needed. Chest pain   Yes Historical Provider, MD  nortriptyline (PAMELOR) 25 MG capsule Take 25 mg by mouth at bedtime.     Yes Historical Provider, MD  Omega-3 Fatty Acids (FISH OIL) 1000 MG CAPS Take 1 capsule by mouth at bedtime.    Yes Historical Provider, MD  pantoprazole (PROTONIX) 40 MG tablet Take 2 tablets (80 mg total) by mouth at bedtime. 12/26/12  Yes Rogelia Mire, NP  pravastatin (PRAVACHOL) 40 MG tablet Take 40 mg by mouth at bedtime.    Yes Historical Provider, MD  TRADJENTA 5 MG TABS tablet Take 5 mg by mouth at bedtime.  08/06/12  Yes Historical Provider, MD  zolpidem (AMBIEN) 10 MG tablet Take 10 mg by mouth at bedtime. for sleep   Yes Historical Provider, MD    Review of Systems  He is fatigued and general malaise with low-energy state as outlined above.  All other systems reviewed and are otherwise negative except as noted above.  Physical Exam  Blood pressure 126/76, pulse 86, resp. rate 16, height 6\' 1"  (1.854 m), weight 297 lb 12.8 oz (135.081 kg).  General: Pleasant, NAD Psych: flat affect. Neuro: Alert and oriented X 3. Moves all extremities spontaneously. HEENT: Normal  Neck: Supple without bruits or JVD. Lungs:  Resp regular and unlabored, CTA. Heart: RRR no s3, s4, or murmurs. Abdomen: Soft, non-tender, non-distended, BS + x 4.  Extremities: No clubbing, cyanosis or edema. DP/PT/Radials 2+ and equal bilaterally.  Accessory Clinical Findings  ECG - regular sinus rhythm, right bundle branch block, left posterior fascicular block, no acute ST or T changes.  Assessment & Plan  1. Coronary artery disease: Status post recent LAD stenting.  He otherwise had residual nonobstructive CAD.  He has had no recurrence of exertional  angina or dyspnea since stent placement.  He has had significant fatigue and general malaise and I discussed his case with Dr. Harrington Challenger.  These complaints appear to be somewhat chronic.  We will check a basic metabolic profile, CBC, and TSH today.  I made no changes to his current home medications and he remains on aspirin, Plavix, beta blocker, and statin therapy.  2.  Hypertension: Stable.  3.  Hyperlipidemia: Continue statin therapy.  4.  Diabetes mellitus: He uses an insulin pump and this is followed closely by primary care.  5.  Depression: I suspect this is playing a large role in his fatigue.  He is on Wellbutrin and Celexa  and is followed in primary care.  6.  Disposition: Blood work today and followup with Dr. Harrington Challenger in approximately one to 2 months.    Murray Hodgkins, NP 01/13/2013, 4:07 PM

## 2013-02-05 ENCOUNTER — Telehealth: Payer: Self-pay | Admitting: Internal Medicine

## 2013-02-05 NOTE — Telephone Encounter (Signed)
Pt clarified he is having chest pain migrating from one side of his chest to the other side, daily, notes off and on for the past 2-3 weeks, pt states he could breath "alot" better once stent was placed in however 3 weeks noted breathing has worsened again, having to sit up at night to breath per notes waking him up at night while lying on left side every night trying to catch his breath, pt declined apt to see MD Ron Parker at 12noon 02-06-13 per located currently in [place],pt advised that discharge instructions post stent was to call our office if his breathing is noted as worsening, pt noted that he will be back in town on 02-07-13, scheduled pt for LG NP on 02-07-13 at 10am, reiterated importance for pt to seek evaluation in ED if sxs worsen prior to OV, pt understood

## 2013-02-05 NOTE — Telephone Encounter (Signed)
New Problem:    Patient called in because he is experiencing across his chest with each breath and is experiencing SOB.  Patient stated that before his stent he was experiencing similar symptoms and believes that he may be experiencing blockages again.

## 2013-02-07 ENCOUNTER — Ambulatory Visit: Payer: Medicare Other | Admitting: Nurse Practitioner

## 2013-02-13 ENCOUNTER — Other Ambulatory Visit (INDEPENDENT_AMBULATORY_CARE_PROVIDER_SITE_OTHER): Payer: Medicare Other

## 2013-02-13 ENCOUNTER — Ambulatory Visit (INDEPENDENT_AMBULATORY_CARE_PROVIDER_SITE_OTHER)
Admission: RE | Admit: 2013-02-13 | Discharge: 2013-02-13 | Disposition: A | Discharge status: Home / Self Care | Payer: Medicare Other | Source: Ambulatory Visit | Attending: Physician Assistant | Admitting: Physician Assistant

## 2013-02-13 ENCOUNTER — Ambulatory Visit (INDEPENDENT_AMBULATORY_CARE_PROVIDER_SITE_OTHER): Payer: Medicare Other | Admitting: Physician Assistant

## 2013-02-13 ENCOUNTER — Ambulatory Visit (INDEPENDENT_AMBULATORY_CARE_PROVIDER_SITE_OTHER): Payer: Medicare Other | Admitting: *Deleted

## 2013-02-13 ENCOUNTER — Encounter: Payer: Self-pay | Admitting: Physician Assistant

## 2013-02-13 VITALS — BP 122/80 | HR 83 | Ht 73.0 in | Wt 298.1 lb

## 2013-02-13 DIAGNOSIS — I10 Essential (primary) hypertension: Secondary | ICD-10-CM

## 2013-02-13 DIAGNOSIS — E781 Pure hyperglyceridemia: Secondary | ICD-10-CM

## 2013-02-13 DIAGNOSIS — R079 Chest pain, unspecified: Secondary | ICD-10-CM

## 2013-02-13 DIAGNOSIS — R0989 Other specified symptoms and signs involving the circulatory and respiratory systems: Secondary | ICD-10-CM

## 2013-02-13 DIAGNOSIS — I2510 Atherosclerotic heart disease of native coronary artery without angina pectoris: Secondary | ICD-10-CM

## 2013-02-13 DIAGNOSIS — R0602 Shortness of breath: Secondary | ICD-10-CM

## 2013-02-13 DIAGNOSIS — E7800 Pure hypercholesterolemia, unspecified: Secondary | ICD-10-CM

## 2013-02-13 DIAGNOSIS — I129 Hypertensive chronic kidney disease with stage 1 through stage 4 chronic kidney disease, or unspecified chronic kidney disease: Secondary | ICD-10-CM

## 2013-02-13 DIAGNOSIS — N189 Chronic kidney disease, unspecified: Secondary | ICD-10-CM

## 2013-02-13 LAB — BRAIN NATRIURETIC PEPTIDE: Pro B Natriuretic peptide (BNP): 27 pg/mL (ref 0.0–100.0)

## 2013-02-13 LAB — BASIC METABOLIC PANEL
BUN: 26 mg/dL — ABNORMAL HIGH (ref 6–23)
CO2: 30 mEq/L (ref 19–32)
Chloride: 104 mEq/L (ref 96–112)
Creatinine, Ser: 2 mg/dL — ABNORMAL HIGH (ref 0.4–1.5)
Glucose, Bld: 106 mg/dL — ABNORMAL HIGH (ref 70–99)
Potassium: 4.3 mEq/L (ref 3.5–5.1)

## 2013-02-13 MED ORDER — METOPROLOL TARTRATE 50 MG PO TABS: 75.0000 mg | ORAL_TABLET | Freq: Two times a day (BID) | ORAL | Status: DC

## 2013-02-13 NOTE — Progress Notes (Signed)
[facility]. [address], [address] [place], [place]  [address] Phone: [phone] Fax:  [phone]  Date:  02/13/2013   ID:  Reginald Duncan, DOB 1951/09/13, MRN [id number]  PCP:  Linard Millers  Primary Cardiologist:  Dr. Dorris Carnes     History of Present Illness: Reginald Duncan is a 62 y.o. male who returns for f/u.  He has a hx of CAD, s/p prior MI in 1980, DM2, HTN, prior stroke, PAD, CKD. He was recently seen by Ignacia Bayley, NP after an admission for [facility].  He had a hx of abnormal myoview at [facility] in 2011 without follow up.  He also has a hx of chronic CP.  He had recently seen Dr. Harrington Challenger and c/o worsening CP and assoc dyspnea.  Myoview in 08/2012 had demonstrated EF 59%, apical thinning vs scar, no ischemia.  LHC demonstrated pLAD 90% (treated with Promus DES), dLAD occluded, patent RCA stents and minimal residual disease elsewhere. EF was normal by nuclear study in 08/2012.  He required hydration due to CKD.  When he saw Mr. Sharolyn Douglas, he complained of fatigue. He had no recurrent exertional angina or dyspnea. Case was reviewed with Dr. Harrington Challenger. Complaints were felt to be fairly chronic.  Lab work was to be obtained.  However, it does not appear that this was done.  Patient tells me that his PCP did draw blood.  Since last seen, he notes worsening dyspnea with exertion.  He is quite limited by back pain. He is probably NYHA Class IIb.  Sleeps on pillows chronically.  No change.  He awakens from dyspnea occasionally (apnea?).  He is non-compliant with CPAP.  No syncope.  He gets lightheaded with worsening back pain.  He notes occasional sharp chest pain.  It is brief and lasts seconds.  It is not like the pain he had prior to his PCI.  This pain has not recurred.    Labs (7/09):  TSH 1.680 Labs (3/14):  K 3.6, Cr 1.7=>1.79, ALT 45, LDL 84, Hgb 13.6=>13.9, TSH 1.92  Wt Readings from Last 3 Encounters:  02/13/13 298 lb 1.9 oz (135.226 kg)  01/13/13 297 lb 12.8 oz (135.081 kg)    12/26/12 290 lb 5.5 oz (131.7 kg)     Past Medical History  Diagnosis Date  . Diabetes mellitus     a. h/o DKA 04/2004  . HTN (hypertension)   . Diabetes mellitus     type II. Diagnosed in 1999  . CVA (cerebral infarction) 1996    hx of  w/some residual L arm weakness  . Myocardial infarct 1980  . Coronary artery disease     a. h/o MI 29;  b. hx of stent;  c. 12/2012 Cath/PCI: LM nl, LAD 90p (3.5x20 Promus DES), 74m, 100d, LCX 20, RCA large, 30p, 20-49m/d w patent stents.  . Hypertension   . Neuropathy   . DJD (degenerative joint disease)   . GERD (gastroesophageal reflux disease)   . Depression   . Dysrhythmia   . Anxiety   . Shortness of breath   . Dementia   . Peripheral vascular disease   . Stroke   . CKD (chronic kidney disease), stage III   . Headache     Current Outpatient Prescriptions  Medication Sig Dispense Refill  . ALPRAZolam (XANAX) 1 MG tablet Take 1 mg by mouth at bedtime. For anxiety      . amLODipine (NORVASC) 10 MG tablet Take 10 mg by mouth at bedtime.       [name]  aspirin 81 MG tablet Take 1 tablet (81 mg total) by mouth daily.      [name] buPROPion (WELLBUTRIN) 100 MG tablet Take 200 mg by mouth at bedtime.       . citalopram (CELEXA) 20 MG tablet Take 20 mg by mouth at bedtime.       . clopidogrel (PLAVIX) 75 MG tablet Take 1 tablet (75 mg total) by mouth daily with breakfast.  30 tablet  6  . fenofibrate micronized (LOFIBRA) 200 MG capsule Take 200 mg by mouth at bedtime.       . furosemide (LASIX) 40 MG tablet       . hydrALAZINE (APRESOLINE) 10 MG tablet Take 1 tablet (10 mg total) by mouth 3 (three) times daily.      [name] HYDROcodone-acetaminophen (NORCO) 7.5-325 MG per tablet Take 1 tablet by mouth every 6 (six) hours as needed. For pain      . Insulin Human (INSULIN PUMP) 100 unit/ml SOLN Inject into the skin. U 500 Concentrated insulin      . insulin regular human CONCENTRATED (HUMULIN R) 500 UNIT/ML SOLN injection Inject into the skin. Sliding scale four  times daily      . lubiprostone (AMITIZA) 24 MCG capsule Take 24-48 mcg by mouth at bedtime.       [name] LYRICA 75 MG capsule Take 150 mg by mouth at bedtime.       . metoprolol (LOPRESSOR) 100 MG tablet Take 0.5 tablets (50 mg total) by mouth 2 (two) times daily.      . nitroGLYCERIN (NITROSTAT) 0.4 MG SL tablet Place 0.4 mg under the tongue every 5 (five) minutes as needed. Chest pain      . nortriptyline (PAMELOR) 25 MG capsule Take 25 mg by mouth at bedtime.        . Omega-3 Fatty Acids (FISH OIL) 1000 MG CAPS Take 1 capsule by mouth at bedtime.       . pantoprazole (PROTONIX) 40 MG tablet Take 2 tablets (80 mg total) by mouth at bedtime.  60 tablet  6  . pravastatin (PRAVACHOL) 40 MG tablet Take 40 mg by mouth at bedtime.       . TRADJENTA 5 MG TABS tablet Take 5 mg by mouth at bedtime.       [name] zolpidem (AMBIEN) 10 MG tablet Take 10 mg by mouth at bedtime. for sleep       No current facility-administered medications for this visit.    Allergies:    Allergies  Allergen Reactions  . Codeine     REACTION: Nausea    Social History:  The patient  reports that he has never smoked. He has never used smokeless tobacco. He reports that he does not drink alcohol or use illicit drugs.   ROS:  Please see the history of present illness.   He notes occ wheezing.  Also notes a cough that is non-productive.   All other systems reviewed and negative.   PHYSICAL EXAM: VS:  BP 122/80  Pulse 83  Ht 6\' 1"  (1.854 m)  Wt 298 lb 1.9 oz (135.226 kg)  BMI 39.34 kg/m2  SpO2 97% Well nourished, well developed, in no acute distress HEENT: normal Neck: no JVD (difficult to assess) Cardiac:  normal S1, S2; RRR; no murmur Lungs:  Decreased breath sounds bilaterally, no wheezing, rhonchi or rales Abd: soft, nontender, no hepatomegaly Ext: trace to 1+ bilateral LE edema Skin: warm and dry Neuro:  CNs 2-12 intact, no focal abnormalities noted  EKG:  NSR, HR 83, RBBB, no change from prior tracing      ASSESSMENT AND PLAN:  1. Dyspnea:  Etiology not clear.  He states he feels like his breathing is like it was prior to his PCI.  Discussed with Dr. Dorris Carnes.  At this point, not convinced this represents angina.  He may have some symptoms from chronically occluded distal LAD.  Will increase metoprolol to 75 mg bid to see if this helps.  He has normal LVF but has mild LVH and diastolic dysfunction noted on echo in 2009.  He also has CKD.  He does not look volume overloaded on exam.  Will check a BMET and BNP today.  Add Lasix if this is significantly elevated.  With his cough, will check a CXR.  If workup unremarkable, I have asked him to f/u with PCP for other causes (? Reactive airways with hx of wheezing, etc).   2. CAD:  Chest pain atypical.  As noted, reviewed with Dr. Dorris Carnes.  No further ischemic eval necessary.  Continue ASA, plavix, beta blocker, statin.   3. Hypertension:  Controlled. 4. Hyperlipidemia:  Continue statin. 5. CKD:  Check BMET today. 6. Disposition:  F/u with Dr. Dorris Carnes in 2 mos.   Signed, Richardson Dopp, PA-C  1:55 PM 02/13/2013

## 2013-02-13 NOTE — Patient Instructions (Addendum)
LABS TODAY BMET,  BNP AND CHEST X-RAY TO BE DONE TODAY AT [facility] ON [address] ACROSS FROM [place]  PLEASE FOLLOW UP WITH DR. ROSS IN 2 MONTHS  INCREASE METOPROLOL TO 50 MG TAB; TAKE 1 AND 1/2 TABLETS TWICE DAILY A NEW RX WAS SENT IN FOR THE 50 MG TABLET

## 2013-02-26 ENCOUNTER — Telehealth: Payer: Self-pay | Admitting: Internal Medicine

## 2013-02-26 ENCOUNTER — Encounter: Payer: Self-pay | Admitting: Physician Assistant

## 2013-02-26 NOTE — Telephone Encounter (Addendum)
New problem    Pt having a lot of coughing and occasionally having trouble breathing and thinks it may be coming from procedure 12/2012

## 2013-02-27 NOTE — Telephone Encounter (Signed)
Advised pt that per the 4/24 OV, he needs to follow up with his PCP

## 2013-05-02 ENCOUNTER — Ambulatory Visit: Payer: Medicare Other | Admitting: Internal Medicine

## 2013-07-11 ENCOUNTER — Ambulatory Visit: Payer: Medicare Other | Admitting: Internal Medicine

## 2013-07-15 ENCOUNTER — Other Ambulatory Visit: Payer: Self-pay

## 2013-07-15 DIAGNOSIS — I2510 Atherosclerotic heart disease of native coronary artery without angina pectoris: Secondary | ICD-10-CM

## 2013-07-15 MED ORDER — METOPROLOL TARTRATE 50 MG PO TABS: 75.0000 mg | ORAL_TABLET | Freq: Two times a day (BID) | ORAL | Status: DC

## 2013-07-15 MED ORDER — CLOPIDOGREL BISULFATE 75 MG PO TABS: 75.0000 mg | ORAL_TABLET | Freq: Every day | ORAL | Status: DC

## 2013-08-18 ENCOUNTER — Ambulatory Visit (INDEPENDENT_AMBULATORY_CARE_PROVIDER_SITE_OTHER): Payer: Medicare Other | Admitting: Internal Medicine

## 2013-08-18 ENCOUNTER — Encounter: Payer: Self-pay | Admitting: Internal Medicine

## 2013-08-18 VITALS — BP 134/90 | HR 99 | Wt 309.0 lb

## 2013-08-18 DIAGNOSIS — I10 Essential (primary) hypertension: Secondary | ICD-10-CM

## 2013-08-18 LAB — BASIC METABOLIC PANEL
BUN: 38 mg/dL — ABNORMAL HIGH (ref 6–23)
CO2: 29 mEq/L (ref 19–32)
Creatinine, Ser: 2.6 mg/dL — ABNORMAL HIGH (ref 0.4–1.5)
GFR: 26.8 mL/min — ABNORMAL LOW (ref >60.00)
Glucose, Bld: 68 mg/dL — ABNORMAL LOW (ref 70–99)
Potassium: 4.3 mEq/L (ref 3.5–5.1)
Sodium: 142 mEq/L (ref 135–145)

## 2013-08-18 NOTE — Patient Instructions (Signed)
Your physician recommends that you schedule a follow-up appointment in: TO BE DETERMINED  Your physician recommends that you HAVE LAB WORK TODAY

## 2013-08-18 NOTE — Progress Notes (Signed)
HPI Reginald Duncan is a 62 y.o. male who returns for f/u. He has a hx of CAD, s/p prior MI in 1980, DM2, HTN, prior stroke, PAD, CKD. He was recently seen by Ignacia Bayley, NP after an admission for [facility]. He had a hx of abnormal myoview at [facility] in 2011 without follow up. He also has a hx of chronic CP. He had recently seen Dr. Harrington Challenger and c/o worsening CP and assoc dyspnea. Myoview in 08/2012 had demonstrated EF 59%, apical thinning vs scar, no ischemia. LHC demonstrated pLAD 90% (treated with Promus DES), dLAD occluded, patent RCA stents and minimal residual disease elsewhere. EF was normal by nuclear study in 08/2012. She was seen by Kathleen Argue in [place] Since seen he was hospitalized o/n at [facility] recently for CP  No damage  Was told they did not think he was hving problems  Was told his kidney function was down.  He is very worried about He points to mid chest and back for where his pain is  Comes with and without acitvity  Severe.   Gets SOB with activity  Chronic  Allergies  Allergen Reactions  . Codeine     REACTION: Nausea    Current Outpatient Prescriptions  Medication Sig Dispense Refill  . ALPRAZolam (XANAX) 1 MG tablet Take 1 mg by mouth 2 (two) times daily. For anxiety      . amLODipine (NORVASC) 10 MG tablet Take 10 mg by mouth at bedtime.       [name] aspirin 81 MG tablet Take 1 tablet (81 mg total) by mouth daily.      [name] buPROPion (WELLBUTRIN) 100 MG tablet Take 100 mg by mouth at bedtime.       . clopidogrel (PLAVIX) 75 MG tablet Take 1 tablet (75 mg total) by mouth daily with breakfast.  30 tablet  6  . fenofibrate micronized (LOFIBRA) 200 MG capsule Take 200 mg by mouth at bedtime.       . furosemide (LASIX) 40 MG tablet TAKE ONE TABLET DAILY      . hydrALAZINE (APRESOLINE) 10 MG tablet TAKE 2 TABLETS IN THE MORNING AND 3 TABLET AT NIGHT      . HYDROcodone-acetaminophen (NORCO) 7.5-325 MG per tablet Take 1 tablet by mouth every 6 (six) hours as needed. For pain       . insulin regular human CONCENTRATED (HUMULIN R) 500 UNIT/ML SOLN injection Inject into the skin. Sliding scale four times daily      . lubiprostone (AMITIZA) 24 MCG capsule Take 24-48 mcg by mouth at bedtime.       [name] LYRICA 75 MG capsule TAKE ONE TABLET IN THE MORNING AND TWO TABLET IN THE EVENING      . metoprolol (LOPRESSOR) 50 MG tablet TAKE 100MG  TABLET IN THE MORNING AND 50MG  AT NIGHT      . NEXIUM 40 MG capsule Take 40 mg by mouth at bedtime.       . nitroGLYCERIN (NITROSTAT) 0.4 MG SL tablet Place 0.4 mg under the tongue every 5 (five) minutes as needed. Chest pain      . nortriptyline (PAMELOR) 25 MG capsule Take 25 mg by mouth at bedtime.        . Omega-3 Fatty Acids (FISH OIL) 1000 MG CAPS Take 1 capsule by mouth at bedtime.       . pravastatin (PRAVACHOL) 40 MG tablet Take 40 mg by mouth at bedtime.       [name] PRESCRIPTION MEDICATION  TAKE 2 TABLET DAILY OF BILITX ANTI DEPRESSION DRUG DAILY      . spironolactone (ALDACTONE) 25 MG tablet TAKE ONE TABLET DAILY      . TRADJENTA 5 MG TABS tablet Take 5 mg by mouth at bedtime.       [name] zolpidem (AMBIEN) 10 MG tablet Take 10 mg by mouth at bedtime. for sleep       No current facility-administered medications for this visit.    Past Medical History  Diagnosis Date  . Diabetes mellitus     a. h/o DKA 04/2004  . HTN (hypertension)   . Diabetes mellitus     type II. Diagnosed in 1999  . CVA (cerebral infarction) 1996    hx of  w/some residual L arm weakness  . Myocardial infarct 1980  . Coronary artery disease     a. h/o MI 34;  b. hx of stent;  c. 12/2012 Cath/PCI: LM nl, LAD 90p (3.5x20 Promus DES), 76m, 100d, LCX 20, RCA large, 30p, 20-35m/d w patent stents.  . Hypertension   . Neuropathy   . DJD (degenerative joint disease)   . GERD (gastroesophageal reflux disease)   . Depression   . Dysrhythmia   . Anxiety   . Shortness of breath   . Dementia   . Peripheral vascular disease   . Stroke   . CKD (chronic kidney disease),  stage III   . [id number])     Past Surgical History  Procedure Laterality Date  . Coronary angioplasty with stent placement    . Coronary angioplasty with stent placement      Family History  Problem Relation Age of Onset  . Heart attack Father 82    deceased  . Leukemia Brother 34    deceased  . Kidney cancer Mother 49    deceased    History   Social History  . Marital Status: Divorced    Spouse Name: N/A    Number of Children: N/A  . Years of Education: N/A   Occupational History  . truck driver    Social History Main Topics  . Smoking status: Never Smoker   . Smokeless tobacco: Never Used  . Alcohol Use: No  . Drug Use: No  . Sexual Activity: No   Other Topics Concern  . Not on file   Social History Narrative  . No narrative on file    Review of Systems:  All systems reviewed.  They are negative to the above problem except as previously stated.  Vital Signs: BP 134/90  Pulse 99  Wt 309 lb (140.161 kg)  BMI 40.78 kg/m2  SpO2 91%  Physical Exam Patient is in NAD HEENT:  Normocephalic, atraumatic. EOMI, PERRLA.  Neck: JVP is normal.  No bruits.  Lungs: clear to auscultation. No rales no wheezes.  Heart: Regular rate and rhythm. Normal S1, S2. No S3.   No significant murmurs. PMI not displaced.  Abdomen:  Supple, nontender. Normal bowel sounds. No masses. No hepatomegaly.  Extremities:   Good distal pulses throughout. No lower extremity edema.  Musculoskeletal :moving all extremities.  Neuro:   alert and oriented x3.  CN II-XII grossly intact.  Assessment and Plan:  1.  CP  Chronic  He says severe.  Recent ER/hosp in [place]  Need to get records.   Pain is somewhat atypical  2.  Renal  Will get records  Check BMET  3.  HTN  BP is a little increased  No change in meds  for now  4.  DM  Continue meds.

## 2013-09-02 ENCOUNTER — Telehealth: Payer: Self-pay | Admitting: Internal Medicine

## 2013-09-02 NOTE — Telephone Encounter (Signed)
Follow up    Pt needs a call back about his lab results please from 10/27

## 2013-09-02 NOTE — Telephone Encounter (Signed)
Spoke with pt, questions regarding lab work and kidney function answered.

## 2014-03-01 NOTE — Progress Notes (Signed)
HPI Reginald Duncan is a 63 y.o. male who returns for f/u. He has a hx of CAD, s/p prior MI in 1980, DM2, HTN, prior stroke, PAD, CKD. He was recently seen by Ignacia Bayley, NP after an admission for [facility]. He had a hx of abnormal myoview at [facility] in 2011 without follow up. He also has a hx of chronic CP.   Myoview in 08/2012 had demonstrated EF 59%, apical thinning vs scar, no ischemia. LHC demonstrated pLAD 90% (treated with Promus DES), dLAD occluded, patent RCA stents and minimal residual disease elsewhere. EF was normal by nuclear study in 08/2012. He was last seen in clinic in the fall.   Since seen he has had more DOE and more episodes of chest tightness.  When he takes a shower he is wiped out.  Dh does say tjhat after last intervention he felt good for awhile  This is new over last wks to months  Getting worse  Allergies  Allergen Reactions  . Codeine     REACTION: Nausea    Current Outpatient Prescriptions  Medication Sig Dispense Refill  . ALPRAZolam (XANAX) 1 MG tablet Take 1 mg by mouth 2 (two) times daily. For anxiety      . amLODipine (NORVASC) 10 MG tablet Take 10 mg by mouth at bedtime.       [name] aspirin 81 MG tablet Take 1 tablet (81 mg total) by mouth daily.      [name] buPROPion (WELLBUTRIN) 100 MG tablet Take 100 mg by mouth at bedtime.       . citalopram (CELEXA) 40 MG tablet       . clopidogrel (PLAVIX) 75 MG tablet Take 1 tablet (75 mg total) by mouth daily with breakfast.  30 tablet  6  . fenofibrate micronized (LOFIBRA) 200 MG capsule Take 200 mg by mouth at bedtime.       . furosemide (LASIX) 40 MG tablet as needed.       . hydrALAZINE (APRESOLINE) 10 MG tablet TAKE 2 TABLETS IN THE MORNING AND 3 TABLET AT NIGHT      . HYDROcodone-acetaminophen (NORCO) 7.5-325 MG per tablet Take 1 tablet by mouth every 6 (six) hours as needed. For pain      . insulin regular human CONCENTRATED (HUMULIN R) 500 UNIT/ML SOLN injection Inject into the skin. Sliding scale four times daily       . lubiprostone (AMITIZA) 24 MCG capsule Take 24-48 mcg by mouth at bedtime.       [name] LYRICA 75 MG capsule TAKE ONE TABLET IN THE MORNING AND TWO TABLET IN THE EVENING      . metoprolol (LOPRESSOR) 50 MG tablet TAKE 100MG  TABLET IN THE MORNING AND 50MG  AT NIGHT      . NEXIUM 40 MG capsule Take 40 mg by mouth at bedtime.       . nitroGLYCERIN (NITROSTAT) 0.4 MG SL tablet Place 0.4 mg under the tongue every 5 (five) minutes as needed. Chest pain      . nortriptyline (PAMELOR) 25 MG capsule Take 25 mg by mouth at bedtime.        . Omega-3 Fatty Acids (FISH OIL) 1000 MG CAPS Take 1 capsule by mouth at bedtime.       . pravastatin (PRAVACHOL) 40 MG tablet Take 40 mg by mouth at bedtime.       [name] PRESCRIPTION MEDICATION TAKE 2 TABLET DAILY OF BILITX ANTI DEPRESSION DRUG DAILY      . spironolactone (  ALDACTONE) 25 MG tablet TAKE ONE TABLET DAILY      . zolpidem (AMBIEN) 10 MG tablet Take 10 mg by mouth at bedtime. for sleep       No current facility-administered medications for this visit.    Past Medical History  Diagnosis Date  . Diabetes mellitus     a. h/o DKA 04/2004  . HTN (hypertension)   . Diabetes mellitus     type II. Diagnosed in 1999  . CVA (cerebral infarction) 1996    hx of  w/some residual L arm weakness  . Myocardial infarct 1980  . Coronary artery disease     a. h/o MI 62;  b. hx of stent;  c. 12/2012 Cath/PCI: LM nl, LAD 90p (3.5x20 Promus DES), 49m, 100d, LCX 20, RCA large, 30p, 20-39m/d w patent stents.  . Hypertension   . Neuropathy   . DJD (degenerative joint disease)   . GERD (gastroesophageal reflux disease)   . Depression   . Dysrhythmia   . Anxiety   . Shortness of breath   . Dementia   . Peripheral vascular disease   . Stroke   . CKD (chronic kidney disease), stage III   . [id number])     Past Surgical History  Procedure Laterality Date  . Coronary angioplasty with stent placement    . Coronary angioplasty with stent placement      Family  History  Problem Relation Age of Onset  . Heart attack Father 86    deceased  . Leukemia Brother 13    deceased  . Kidney cancer Mother 52    deceased    History   Social History  . Marital Status: Divorced    Spouse Name: N/A    Number of Children: N/A  . Years of Education: N/A   Occupational History  . truck driver    Social History Main Topics  . Smoking status: Never Smoker   . Smokeless tobacco: Never Used  . Alcohol Use: No  . Drug Use: No  . Sexual Activity: No   Other Topics Concern  . Not on file   Social History Narrative  . No narrative on file    Review of Systems:  All systems reviewed.  They are negative to the above problem except as previously stated.  Vital Signs: BP 132/72  Pulse 89  Wt 306 lb (138.801 kg) With walking he gets very tachypneic  Sats stay at 97%  Physical Exam Patient is in NAD at rest HEENT:  Normocephalic, atraumatic. EOMI, PERRLA.  Neck: JVP is normal.  Lungs: clear to auscultation. No rales no wheezes.  Heart: Regular rate and rhythm. Normal S1, S2. No S3.   No significant murmurs. PMI not displaced.  Abdomen:  Supple, nontender. Normal bowel sounds. No masses. No hepatomegaly.  Extremitie. No lower extremity edema. Chr discoloration  Musculoskeletal :moving all extremities.  Neuro:   alert and oriented x3.  CN II-XII grossly intact.  Assessment and Plan:  1.  CP  Patinet has worsening of symptoms  Similar to what he had prior to last stent More dyspnea on exertion  Given that last myoview was not revealing even prior to stent, I would recomm L heart cath to redefine anatomy. 2.  Renal  Get labss  3.  HTN  Continue meds.   4.  DM  Continue meds.

## 2014-03-06 ENCOUNTER — Ambulatory Visit (INDEPENDENT_AMBULATORY_CARE_PROVIDER_SITE_OTHER): Payer: Medicare Other | Admitting: Internal Medicine

## 2014-03-06 VITALS — BP 132/72 | HR 89 | Wt 306.0 lb

## 2014-03-06 DIAGNOSIS — I10 Essential (primary) hypertension: Secondary | ICD-10-CM

## 2014-03-06 DIAGNOSIS — R0609 Other forms of dyspnea: Secondary | ICD-10-CM

## 2014-03-06 DIAGNOSIS — N289 Disorder of kidney and ureter, unspecified: Secondary | ICD-10-CM

## 2014-03-06 DIAGNOSIS — I2510 Atherosclerotic heart disease of native coronary artery without angina pectoris: Secondary | ICD-10-CM

## 2014-03-06 DIAGNOSIS — R0600 Dyspnea, unspecified: Secondary | ICD-10-CM

## 2014-03-06 DIAGNOSIS — R0989 Other specified symptoms and signs involving the circulatory and respiratory systems: Secondary | ICD-10-CM

## 2014-03-13 ENCOUNTER — Encounter: Payer: Self-pay | Admitting: Internal Medicine

## 2014-03-19 ENCOUNTER — Telehealth: Payer: Self-pay | Admitting: Internal Medicine

## 2014-03-19 DIAGNOSIS — I10 Essential (primary) hypertension: Secondary | ICD-10-CM

## 2014-03-19 DIAGNOSIS — N183 Chronic kidney disease, stage 3 unspecified: Secondary | ICD-10-CM

## 2014-03-19 DIAGNOSIS — R079 Chest pain, unspecified: Secondary | ICD-10-CM

## 2014-03-19 DIAGNOSIS — I200 Unstable angina: Secondary | ICD-10-CM

## 2014-03-19 NOTE — Telephone Encounter (Signed)
Spoke with pt, he had his lab work done with [facility]. Called and requested lab work, will show to dr Harrington Challenger tomorrow and call him about getting a cath. Pt agreed with this plan.

## 2014-03-19 NOTE — Telephone Encounter (Signed)
New Message:  Pt is requesting a call back to set up his heart cath procedure

## 2014-03-23 NOTE — Telephone Encounter (Signed)
Follow up     Want to talk to a nurse regarding getting a cath scheduled

## 2014-03-26 ENCOUNTER — Other Ambulatory Visit (INDEPENDENT_AMBULATORY_CARE_PROVIDER_SITE_OTHER): Payer: Medicare Other

## 2014-03-26 DIAGNOSIS — I10 Essential (primary) hypertension: Secondary | ICD-10-CM

## 2014-03-26 DIAGNOSIS — R079 Chest pain, unspecified: Secondary | ICD-10-CM

## 2014-03-26 DIAGNOSIS — N183 Chronic kidney disease, stage 3 unspecified: Secondary | ICD-10-CM

## 2014-03-26 DIAGNOSIS — I200 Unstable angina: Secondary | ICD-10-CM

## 2014-03-26 NOTE — Telephone Encounter (Signed)
Spoke with pt, he is going to come by here for lab work. Orders placed.

## 2014-03-26 NOTE — Telephone Encounter (Signed)
Left message for pt to call, I have called [facility] multiple times to get pts lab results. So far have not received. At this point labs will be too old to use for pre-cath labs. Left message for pt to call to see if he will come here for labs

## 2014-03-27 LAB — CBC WITH DIFFERENTIAL/PLATELET
Basophils Absolute: 0 10*3/uL (ref 0.0–0.1)
Basophils Relative: 0.2 % (ref 0.0–3.0)
EOS PCT: 4 % (ref 0.0–5.0)
Eosinophils Absolute: 0.3 10*3/uL (ref 0.0–0.7)
HCT: 42.5 % (ref 39.0–52.0)
Hemoglobin: 14 g/dL (ref 13.0–17.0)
Lymphocytes Relative: 11 % — ABNORMAL LOW (ref 12.0–46.0)
Lymphs Abs: 0.9 10*3/uL (ref 0.7–4.0)
MCHC: 33 g/dL (ref 30.0–36.0)
MCV: 92.8 fl (ref 78.0–100.0)
MONOS PCT: 4.5 % (ref 3.0–12.0)
Monocytes Absolute: 0.4 10*3/uL (ref 0.1–1.0)
NEUTROS PCT: 80.3 % — AB (ref 43.0–77.0)
Neutro Abs: 6.9 10*3/uL (ref 1.4–7.7)
PLATELETS: 232 10*3/uL (ref 150.0–400.0)
RBC: 4.58 Mil/uL (ref 4.22–5.81)
RDW: 15.3 % (ref 11.5–15.5)
WBC: 8.6 10*3/uL (ref 4.0–10.5)

## 2014-03-27 LAB — BASIC METABOLIC PANEL
BUN: 30 mg/dL — ABNORMAL HIGH (ref 6–23)
CHLORIDE: 103 meq/L (ref 96–112)
CO2: 29 mEq/L (ref 19–32)
CREATININE: 2 mg/dL — AB (ref 0.4–1.5)
Calcium: 9.6 mg/dL (ref 8.4–10.5)
GFR: 36.2 mL/min — ABNORMAL LOW (ref >60.00)
Glucose, Bld: 300 mg/dL — ABNORMAL HIGH (ref 70–99)
POTASSIUM: 4.8 meq/L (ref 3.5–5.1)
Sodium: 139 mEq/L (ref 135–145)

## 2014-03-27 LAB — PROTIME-INR
INR: 1.2 ratio — ABNORMAL HIGH (ref 0.8–1.0)
PROTHROMBIN TIME: 13.2 s — AB (ref 9.6–13.1)

## 2014-03-30 ENCOUNTER — Telehealth: Payer: Self-pay | Admitting: Internal Medicine

## 2014-03-30 NOTE — Telephone Encounter (Signed)
Called wanting to know results of pre cath labs.  Advised Dr. Harrington Challenger has not reviewed labs yet.  Cath has not been scheduled yet because waiting on labs.  Will forward to Dr. Harrington Challenger for comment

## 2014-03-30 NOTE — Telephone Encounter (Signed)
New problem   Pt wanting his lab results for his cath. Please call pt.

## 2014-04-02 ENCOUNTER — Encounter: Payer: Self-pay | Admitting: *Deleted

## 2014-04-03 ENCOUNTER — Encounter (HOSPITAL_COMMUNITY): Payer: Self-pay | Admitting: Pharmacy Technician

## 2014-04-06 ENCOUNTER — Other Ambulatory Visit: Payer: Self-pay | Admitting: Internal Medicine

## 2014-04-08 ENCOUNTER — Encounter (HOSPITAL_COMMUNITY)
Admission: RE | Disposition: A | Discharge status: Home / Self Care | Payer: Self-pay | Source: Ambulatory Visit | Attending: Cardiology

## 2014-04-08 ENCOUNTER — Other Ambulatory Visit: Payer: Self-pay | Admitting: Internal Medicine

## 2014-04-08 ENCOUNTER — Encounter (HOSPITAL_COMMUNITY): Payer: Self-pay | Admitting: Physician Assistant

## 2014-04-08 ENCOUNTER — Ambulatory Visit (HOSPITAL_COMMUNITY)
Admission: RE | Admit: 2014-04-08 | Discharge: 2014-04-11 | Disposition: A | Discharge status: Home / Self Care | Payer: Medicare Other | Source: Ambulatory Visit | Attending: Cardiology | Admitting: Cardiology

## 2014-04-08 DIAGNOSIS — F411 Generalized anxiety disorder: Secondary | ICD-10-CM | POA: Insufficient documentation

## 2014-04-08 DIAGNOSIS — G8929 Other chronic pain: Secondary | ICD-10-CM | POA: Insufficient documentation

## 2014-04-08 DIAGNOSIS — Z794 Long term (current) use of insulin: Secondary | ICD-10-CM | POA: Insufficient documentation

## 2014-04-08 DIAGNOSIS — N183 Chronic kidney disease, stage 3 unspecified: Secondary | ICD-10-CM

## 2014-04-08 DIAGNOSIS — N184 Chronic kidney disease, stage 4 (severe): Secondary | ICD-10-CM | POA: Insufficient documentation

## 2014-04-08 DIAGNOSIS — E119 Type 2 diabetes mellitus without complications: Secondary | ICD-10-CM | POA: Insufficient documentation

## 2014-04-08 DIAGNOSIS — M1990 Unspecified osteoarthritis, unspecified site: Secondary | ICD-10-CM | POA: Insufficient documentation

## 2014-04-08 DIAGNOSIS — G4730 Sleep apnea, unspecified: Secondary | ICD-10-CM

## 2014-04-08 DIAGNOSIS — F0390 Unspecified dementia without behavioral disturbance: Secondary | ICD-10-CM | POA: Insufficient documentation

## 2014-04-08 DIAGNOSIS — I209 Angina pectoris, unspecified: Secondary | ICD-10-CM

## 2014-04-08 DIAGNOSIS — E1165 Type 2 diabetes mellitus with hyperglycemia: Secondary | ICD-10-CM

## 2014-04-08 DIAGNOSIS — Z9861 Coronary angioplasty status: Secondary | ICD-10-CM | POA: Insufficient documentation

## 2014-04-08 DIAGNOSIS — R0609 Other forms of dyspnea: Secondary | ICD-10-CM | POA: Insufficient documentation

## 2014-04-08 DIAGNOSIS — I2510 Atherosclerotic heart disease of native coronary artery without angina pectoris: Secondary | ICD-10-CM

## 2014-04-08 DIAGNOSIS — R079 Chest pain, unspecified: Secondary | ICD-10-CM

## 2014-04-08 DIAGNOSIS — E118 Type 2 diabetes mellitus with unspecified complications: Secondary | ICD-10-CM

## 2014-04-08 DIAGNOSIS — I200 Unstable angina: Secondary | ICD-10-CM | POA: Insufficient documentation

## 2014-04-08 DIAGNOSIS — R0989 Other specified symptoms and signs involving the circulatory and respiratory systems: Secondary | ICD-10-CM | POA: Insufficient documentation

## 2014-04-08 DIAGNOSIS — M549 Dorsalgia, unspecified: Secondary | ICD-10-CM | POA: Insufficient documentation

## 2014-04-08 DIAGNOSIS — Z7902 Long term (current) use of antithrombotics/antiplatelets: Secondary | ICD-10-CM | POA: Insufficient documentation

## 2014-04-08 DIAGNOSIS — R42 Dizziness and giddiness: Secondary | ICD-10-CM | POA: Insufficient documentation

## 2014-04-08 DIAGNOSIS — K219 Gastro-esophageal reflux disease without esophagitis: Secondary | ICD-10-CM | POA: Insufficient documentation

## 2014-04-08 DIAGNOSIS — I252 Old myocardial infarction: Secondary | ICD-10-CM | POA: Insufficient documentation

## 2014-04-08 DIAGNOSIS — G4733 Obstructive sleep apnea (adult) (pediatric): Secondary | ICD-10-CM | POA: Insufficient documentation

## 2014-04-08 DIAGNOSIS — G589 Mononeuropathy, unspecified: Secondary | ICD-10-CM | POA: Insufficient documentation

## 2014-04-08 DIAGNOSIS — I129 Hypertensive chronic kidney disease with stage 1 through stage 4 chronic kidney disease, or unspecified chronic kidney disease: Secondary | ICD-10-CM | POA: Insufficient documentation

## 2014-04-08 DIAGNOSIS — Z6839 Body mass index (BMI) 39.0-39.9, adult: Secondary | ICD-10-CM | POA: Insufficient documentation

## 2014-04-08 DIAGNOSIS — Z7982 Long term (current) use of aspirin: Secondary | ICD-10-CM | POA: Insufficient documentation

## 2014-04-08 DIAGNOSIS — IMO0002 Reserved for concepts with insufficient information to code with codable children: Secondary | ICD-10-CM

## 2014-04-08 DIAGNOSIS — Z8673 Personal history of transient ischemic attack (TIA), and cerebral infarction without residual deficits: Secondary | ICD-10-CM | POA: Insufficient documentation

## 2014-04-08 DIAGNOSIS — M6281 Muscle weakness (generalized): Secondary | ICD-10-CM | POA: Insufficient documentation

## 2014-04-08 DIAGNOSIS — I10 Essential (primary) hypertension: Secondary | ICD-10-CM

## 2014-04-08 HISTORY — PX: LEFT HEART CATHETERIZATION WITH CORONARY ANGIOGRAM: SHX5451

## 2014-04-08 HISTORY — DX: Scoliosis, unspecified: M41.9

## 2014-04-08 HISTORY — DX: Pneumonia, unspecified organism: J18.9

## 2014-04-08 HISTORY — DX: Unspecified chronic bronchitis: J42

## 2014-04-08 HISTORY — DX: Chronic obstructive pulmonary disease, unspecified: J44.9

## 2014-04-08 HISTORY — DX: Migraine, unspecified, not intractable, without status migrainosus: G43.909

## 2014-04-08 HISTORY — DX: Heart failure, unspecified: I50.9

## 2014-04-08 HISTORY — DX: Claustrophobia: F40.240

## 2014-04-08 HISTORY — DX: Unspecified osteoarthritis, unspecified site: M19.90

## 2014-04-08 HISTORY — DX: Type 2 diabetes mellitus without complications: E11.9

## 2014-04-08 HISTORY — DX: Cerebral infarction, unspecified: I63.9

## 2014-04-08 HISTORY — DX: Sleep apnea, unspecified: G47.30

## 2014-04-08 HISTORY — DX: Dorsalgia, unspecified: M54.9

## 2014-04-08 HISTORY — DX: Morbid (severe) obesity due to excess calories: E66.01

## 2014-04-08 HISTORY — DX: Pure hypercholesterolemia, unspecified: E78.00

## 2014-04-08 HISTORY — DX: Umbilical hernia without obstruction or gangrene: K42.9

## 2014-04-08 HISTORY — DX: Chronic kidney disease, stage 4 (severe): N18.4

## 2014-04-08 HISTORY — DX: Other chronic pain: G89.29

## 2014-04-08 LAB — GLUCOSE, CAPILLARY
GLUCOSE-CAPILLARY: 84 mg/dL (ref 70–99)
GLUCOSE-CAPILLARY: 99 mg/dL (ref 70–99)
GLUCOSE-CAPILLARY: 107 mg/dL — AB (ref 70–99)
GLUCOSE-CAPILLARY: 196 mg/dL — AB (ref 70–99)
GLUCOSE-CAPILLARY: 198 mg/dL — AB (ref 70–99)
Glucose-Capillary: 85 mg/dL (ref 70–99)

## 2014-04-08 LAB — BASIC METABOLIC PANEL
BUN: 28 mg/dL — ABNORMAL HIGH (ref 6–23)
CHLORIDE: 103 meq/L (ref 96–112)
CO2: 23 mEq/L (ref 19–32)
Calcium: 9.8 mg/dL (ref 8.4–10.5)
Creatinine, Ser: 1.96 mg/dL — ABNORMAL HIGH (ref 0.50–1.35)
GFR calc non Af Amer: 35 mL/min — ABNORMAL LOW (ref >90)
GFR, EST AFRICAN AMERICAN: 41 mL/min — AB (ref >90)
Glucose, Bld: 87 mg/dL (ref 70–99)
Potassium: 4.7 mEq/L (ref 3.7–5.3)
Sodium: 141 mEq/L (ref 137–147)

## 2014-04-08 SURGERY — LEFT HEART CATHETERIZATION WITH CORONARY ANGIOGRAM
Anesthesia: LOCAL

## 2014-04-08 MED ORDER — SIMVASTATIN 20 MG PO TABS
20.0000 mg | ORAL_TABLET | Freq: Every day | ORAL | Status: DC
  Administered 2014-04-08 – 2014-04-10 (×3): 20 mg via ORAL
  Filled 2014-04-08 (×4): qty 1

## 2014-04-08 MED ORDER — HEPARIN SODIUM (PORCINE) 1000 UNIT/ML IJ SOLN
INTRAMUSCULAR | Status: AC
  Filled 2014-04-08: qty 1

## 2014-04-08 MED ORDER — TRAMADOL HCL 50 MG PO TABS
50.0000 mg | ORAL_TABLET | Freq: Two times a day (BID) | ORAL | Status: DC | PRN
  Administered 2014-04-08: 50 mg via ORAL

## 2014-04-08 MED ORDER — PREGABALIN 25 MG PO CAPS
150.0000 mg | ORAL_CAPSULE | Freq: Every day | ORAL | Status: DC
  Administered 2014-04-08 – 2014-04-10 (×3): 150 mg via ORAL
  Filled 2014-04-08 (×3): qty 6

## 2014-04-08 MED ORDER — INSULIN ASPART 100 UNIT/ML ~~LOC~~ SOLN
0.0000 [IU] | Freq: Three times a day (TID) | SUBCUTANEOUS | Status: DC
  Administered 2014-04-09: 7 [IU] via SUBCUTANEOUS

## 2014-04-08 MED ORDER — PREGABALIN 25 MG PO CAPS
75.0000 mg | ORAL_CAPSULE | Freq: Two times a day (BID) | ORAL | Status: DC
Start: 2014-04-08 — End: 2014-04-08

## 2014-04-08 MED ORDER — CITALOPRAM HYDROBROMIDE 40 MG PO TABS
40.0000 mg | ORAL_TABLET | Freq: Every day | ORAL | Status: DC
  Administered 2014-04-08 – 2014-04-10 (×3): 40 mg via ORAL
  Filled 2014-04-08 (×4): qty 1

## 2014-04-08 MED ORDER — SODIUM CHLORIDE 0.9 % IJ SOLN: 3.0000 mL | Freq: Two times a day (BID) | INTRAMUSCULAR | Status: DC

## 2014-04-08 MED ORDER — ZOLPIDEM TARTRATE 5 MG PO TABS
10.0000 mg | ORAL_TABLET | Freq: Every day | ORAL | Status: DC
  Administered 2014-04-08: 10 mg via ORAL
  Filled 2014-04-08: qty 2

## 2014-04-08 MED ORDER — SODIUM CHLORIDE 0.9 % IV SOLN: 1.0000 mL/kg/h | INTRAVENOUS | Status: AC

## 2014-04-08 MED ORDER — SODIUM CHLORIDE 0.9 % IV SOLN: 250.0000 mL | INTRAVENOUS | Status: DC | PRN

## 2014-04-08 MED ORDER — CLOPIDOGREL BISULFATE 75 MG PO TABS
75.0000 mg | ORAL_TABLET | Freq: Every day | ORAL | Status: DC
  Administered 2014-04-09 – 2014-04-10 (×2): 75 mg via ORAL
  Filled 2014-04-08 (×2): qty 1

## 2014-04-08 MED ORDER — BUPROPION HCL 100 MG PO TABS
100.0000 mg | ORAL_TABLET | Freq: Every day | ORAL | Status: DC
  Administered 2014-04-08 – 2014-04-10 (×3): 100 mg via ORAL
  Filled 2014-04-08 (×4): qty 1

## 2014-04-08 MED ORDER — LIDOCAINE HCL (PF) 1 % IJ SOLN
INTRAMUSCULAR | Status: AC
  Filled 2014-04-08: qty 30

## 2014-04-08 MED ORDER — METOPROLOL TARTRATE 100 MG PO TABS
100.0000 mg | ORAL_TABLET | Freq: Every day | ORAL | Status: DC
  Administered 2014-04-08 – 2014-04-10 (×3): 100 mg via ORAL
  Filled 2014-04-08 (×4): qty 1

## 2014-04-08 MED ORDER — FENTANYL CITRATE 0.05 MG/ML IJ SOLN
INTRAMUSCULAR | Status: AC
  Filled 2014-04-08: qty 2

## 2014-04-08 MED ORDER — ASPIRIN 81 MG PO CHEW: 81.0000 mg | CHEWABLE_TABLET | ORAL | Status: DC

## 2014-04-08 MED ORDER — MIDAZOLAM HCL 2 MG/2ML IJ SOLN
INTRAMUSCULAR | Status: AC
  Filled 2014-04-08: qty 2

## 2014-04-08 MED ORDER — SODIUM CHLORIDE 0.9 % IJ SOLN: 3.0000 mL | INTRAMUSCULAR | Status: DC | PRN

## 2014-04-08 MED ORDER — AMLODIPINE BESYLATE 10 MG PO TABS
10.0000 mg | ORAL_TABLET | Freq: Every day | ORAL | Status: DC
  Administered 2014-04-08 – 2014-04-10 (×3): 10 mg via ORAL
  Filled 2014-04-08 (×4): qty 1

## 2014-04-08 MED ORDER — NITROGLYCERIN 0.4 MG SL SUBL: 0.4000 mg | SUBLINGUAL_TABLET | SUBLINGUAL | Status: DC | PRN

## 2014-04-08 MED ORDER — ASPIRIN 81 MG PO CHEW
81.0000 mg | CHEWABLE_TABLET | ORAL | Status: AC
  Administered 2014-04-08: 81 mg via ORAL
  Filled 2014-04-08: qty 1

## 2014-04-08 MED ORDER — SODIUM CHLORIDE 0.9 % IJ SOLN
3.0000 mL | INTRAMUSCULAR | Status: DC | PRN
Start: 2014-04-08 — End: 2014-04-08

## 2014-04-08 MED ORDER — PREGABALIN 25 MG PO CAPS
75.0000 mg | ORAL_CAPSULE | Freq: Every day | ORAL | Status: DC
  Administered 2014-04-09 – 2014-04-10 (×2): 75 mg via ORAL
  Filled 2014-04-08 (×2): qty 3

## 2014-04-08 MED ORDER — HYDRALAZINE HCL 10 MG PO TABS
20.0000 mg | ORAL_TABLET | Freq: Three times a day (TID) | ORAL | Status: DC
  Administered 2014-04-08 (×2): 20 mg via ORAL
  Filled 2014-04-08 (×5): qty 2

## 2014-04-08 MED ORDER — NORTRIPTYLINE HCL 25 MG PO CAPS
25.0000 mg | ORAL_CAPSULE | Freq: Every day | ORAL | Status: DC
  Administered 2014-04-08 – 2014-04-10 (×3): 25 mg via ORAL
  Filled 2014-04-08 (×4): qty 1

## 2014-04-08 MED ORDER — LUBIPROSTONE 24 MCG PO CAPS
72.0000 ug | ORAL_CAPSULE | Freq: Every day | ORAL | Status: DC | PRN
  Filled 2014-04-08: qty 3

## 2014-04-08 MED ORDER — HEPARIN (PORCINE) IN NACL 2-0.9 UNIT/ML-% IJ SOLN
INTRAMUSCULAR | Status: AC
  Filled 2014-04-08: qty 1000

## 2014-04-08 MED ORDER — NITROGLYCERIN 0.2 MG/ML ON CALL CATH LAB
INTRAVENOUS | Status: AC
  Filled 2014-04-08: qty 1

## 2014-04-08 MED ORDER — ASPIRIN 325 MG PO TABS
162.5000 mg | ORAL_TABLET | Freq: Every day | ORAL | Status: DC
  Filled 2014-04-08 (×2): qty 0.5

## 2014-04-08 MED ORDER — HYDROCODONE-ACETAMINOPHEN 7.5-325 MG PO TABS: 1.0000 | ORAL_TABLET | Freq: Four times a day (QID) | ORAL | Status: DC | PRN

## 2014-04-08 MED ORDER — ALPRAZOLAM 0.5 MG PO TABS
1.0000 mg | ORAL_TABLET | Freq: Two times a day (BID) | ORAL | Status: DC
  Administered 2014-04-08 – 2014-04-10 (×5): 1 mg via ORAL
  Filled 2014-04-08 (×5): qty 2

## 2014-04-08 MED ORDER — PANTOPRAZOLE SODIUM 40 MG PO TBEC
40.0000 mg | DELAYED_RELEASE_TABLET | Freq: Every day | ORAL | Status: DC
  Administered 2014-04-08 – 2014-04-10 (×3): 40 mg via ORAL
  Filled 2014-04-08 (×3): qty 1

## 2014-04-08 MED ORDER — VERAPAMIL HCL 2.5 MG/ML IV SOLN
INTRAVENOUS | Status: AC
  Filled 2014-04-08: qty 2

## 2014-04-08 MED ORDER — SODIUM CHLORIDE 0.9 % IV SOLN
INTRAVENOUS | Status: DC
  Administered 2014-04-08: 07:00:00 via INTRAVENOUS

## 2014-04-08 MED ORDER — INSULIN PUMP
Freq: Three times a day (TID) | SUBCUTANEOUS | Status: DC
  Administered 2014-04-08: 5.7 via SUBCUTANEOUS
  Administered 2014-04-09: 09:00:00 5 via SUBCUTANEOUS
  Filled 2014-04-08: qty 1

## 2014-04-08 MED ORDER — SODIUM CHLORIDE 0.9 % IV SOLN: INTRAVENOUS | Status: DC

## 2014-04-08 MED ORDER — FENOFIBRATE 54 MG PO TABS
54.0000 mg | ORAL_TABLET | Freq: Every day | ORAL | Status: DC
  Administered 2014-04-08 – 2014-04-10 (×3): 54 mg via ORAL
  Filled 2014-04-08 (×4): qty 1

## 2014-04-08 NOTE — Interval H&P Note (Signed)
History and Physical Interval Note:  04/08/2014 2:41 PM  [facility] [employer]  has presented today for surgery, with the diagnosis of cp  The various methods of treatment have been discussed with the patient and family. After consideration of risks, benefits and other options for treatment, the patient has consented to  Procedure(s): LEFT HEART CATHETERIZATION WITH CORONARY ANGIOGRAM (N/A) as a surgical intervention .  The patient's history has been reviewed, patient examined, no change in status, stable for surgery.  I have reviewed the patient's chart and labs.  Questions were answered to the patient's satisfaction.    Cath Lab Visit (complete for each Cath Lab visit)  Clinical Evaluation Leading to the Procedure:   ACS: no  Non-ACS:    Anginal Classification: CCS III  Anti-ischemic medical therapy: Maximal Therapy (2 or more classes of medications)  Non-Invasive Test Results: No non-invasive testing performed  Prior CABG: No previous CABG       Collier [facility]ina [facility] 04/08/2014 2:41 PM

## 2014-04-08 NOTE — CV Procedure (Signed)
    Cardiac Catheterization Procedure Note  Name: Reginald Duncan MRN: [id number] DOB: October 22, 1951  Procedure: Left Heart Cath, Selective Coronary Angiography  Indication: 63 yo WM with history of CAD s/p remote PCI of the OM and DES of the proximal LAD in 3/14 presents with symptoms of chest pain and dyspnea.    Procedural Details: The right wrist was prepped, draped, and anesthetized with 1% lidocaine. Using the modified Seldinger technique, a 6 French slender sheath was introduced into the right radial artery. 3 mg of verapamil was administered through the sheath, weight-based unfractionated heparin was administered intravenously. Standard Judkins catheters were used for selective coronary angiography and left ventricular pressures. Catheter exchanges were performed over an exchange length guidewire. There were no immediate procedural complications. A TR band was used for radial hemostasis at the completion of the procedure.  The patient was transferred to the post catheterization recovery area for further monitoring.  Procedural Findings: Hemodynamics: AO 115/70 mean 88 mm Hg LV 116/16 mm Hg  Coronary angiography: Coronary dominance: right  Left mainstem: mildly calcified with <10% disease.  Left anterior descending (LAD): The LAD stent is widely patent proximally. The mid LAD is diffusely diseased to 30-40%. The distal LAD is occluded with left to left collaterals from the diagonal. The first diagonal is a small branch with mild diffuse disease. The second diagonal is a large branch with mild disease less than 20%.   Left circumflex (LCx): Mild disease less than 20%. The first OM is widely patent.   Right coronary artery (RCA): The RCA is a very large vessel. There is a focal 40% stenosis proximally. The distal vessel has diffuse disease less than 30%.   Left ventriculography: not done.  Final Conclusions:   1. Single vessel occlusive coronary artery disease with chronic occlusion of  the distal LAD. The stent in the proximal LAD is patent.  Recommendations: Continue medical therapy. With his CKD he will need 12 hours of hydration so he will be kept overnight.   Peter Martinique, [place]  04/08/2014, 3:19 PM
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
## 2014-04-08 NOTE — Care Management Note (Addendum)
  Page 2 of 2   04/10/2014     5:06:59 PM CARE MANAGEMENT NOTE 04/10/2014  Patient:  Reginald Duncan, Reginald Duncan   Account Number:  [id number]  Date Initiated:  04/08/2014  Documentation initiated by:  Mariann Laster  Subjective/Objective Assessment:   chest pain     Action/Plan:   CM to follow for dispositon needs   Anticipated DC Date:  04/09/2014   Anticipated DC Plan:  [place]  CM consult      [facility] Choice  HOME HEALTH   Choice offered to / List presented to:  C-1 Patient        [place] arranged  HH-1 RN  [place]      [place].   Status of service:  Completed, signed off Medicare Important Message given?  NA - LOS <3 / Initial given by admissions (If response is "NO", the following Medicare IM given date fields will be blank) Date Medicare IM given:   Date Additional Medicare IM given:    Discharge Disposition:  HOME/SELF CARE  Per UR Regulation:    If discussed at Long Length of Stay Meetings, dates discussed:    Comments:  Crystal Hutchinson RN, BSN, MSHL, CCM  Nurse - Case Manager, (Unit [phone]  04/09/2014 CM contacted HUD contact [place] to notify that patient is IP and n/a for home inspection.  Contact states she will place note in chart. CM contacted patients brother/Chris [phone] to request he f/u with patient re:  a financial concern of making an insurance payment which is currently due. PT RECS:  CIR Eval completed and not a candidate. Patient has previously elected to go home rather than SNF. Dispositon Plan at this time is home with HHS:  RN, PT, SW unless new PT REC and patient agrees to SNF. [place] notified of d/c today or in am.   Mariann Laster RN, BSN, MSHL, CCM  Nurse - Case Manager, (Unit [phone]  04/09/2014 Psycho/Social:  From home alone.  Patient states s/s of chronic depression mgmt.  States he has discused with PCP who states there is no  other medications she is able to Rx.  Hx/o of counseling 2011.  States he lives in the past and has lost all his family members and friends to death.  CM encouraged patient to discuss Referral to Specialist to review med mgmt for depression mgmt and assess for further couseling needs. CM discussed decreased motivation for self care and mgmt and unmanaged depression symptoms can contribute to barrier in his care.  HHS: Hx/o past HHS with [facility]; patient states had a falling out and did not feel he was getting enough from [facility]. Patient is receptive to trying a different agency. HHS:  RN, PT ([facility]/ Donna notified)   Crystal Hutchinson RN, BSN, MSHL, CCM  Nurse - Case Manager, (Unit [phone]  04/08/2014 Med Review: Plavix

## 2014-04-09 ENCOUNTER — Other Ambulatory Visit: Payer: Self-pay | Admitting: Physician Assistant

## 2014-04-09 ENCOUNTER — Encounter (HOSPITAL_COMMUNITY): Payer: Self-pay | Admitting: Physician Assistant

## 2014-04-09 DIAGNOSIS — R079 Chest pain, unspecified: Secondary | ICD-10-CM

## 2014-04-09 DIAGNOSIS — E118 Type 2 diabetes mellitus with unspecified complications: Secondary | ICD-10-CM

## 2014-04-09 DIAGNOSIS — E1165 Type 2 diabetes mellitus with hyperglycemia: Secondary | ICD-10-CM

## 2014-04-09 DIAGNOSIS — I517 Cardiomegaly: Secondary | ICD-10-CM

## 2014-04-09 DIAGNOSIS — IMO0002 Reserved for concepts with insufficient information to code with codable children: Secondary | ICD-10-CM

## 2014-04-09 DIAGNOSIS — N183 Chronic kidney disease, stage 3 unspecified: Secondary | ICD-10-CM

## 2014-04-09 DIAGNOSIS — G4730 Sleep apnea, unspecified: Secondary | ICD-10-CM

## 2014-04-09 HISTORY — PX: CARDIAC CATHETERIZATION: SHX172

## 2014-04-09 LAB — GLUCOSE, CAPILLARY
GLUCOSE-CAPILLARY: 43 mg/dL — AB (ref 70–99)
GLUCOSE-CAPILLARY: 239 mg/dL — AB (ref 70–99)
GLUCOSE-CAPILLARY: 169 mg/dL — AB (ref 70–99)
Glucose-Capillary: 32 mg/dL — CL (ref 70–99)
Glucose-Capillary: 130 mg/dL — ABNORMAL HIGH (ref 70–99)
Glucose-Capillary: 156 mg/dL — ABNORMAL HIGH (ref 70–99)
Glucose-Capillary: 168 mg/dL — ABNORMAL HIGH (ref 70–99)

## 2014-04-09 LAB — BASIC METABOLIC PANEL
BUN: 30 mg/dL — ABNORMAL HIGH (ref 6–23)
CALCIUM: 9.2 mg/dL (ref 8.4–10.5)
CO2: 23 meq/L (ref 19–32)
CREATININE: 2.01 mg/dL — AB (ref 0.50–1.35)
Chloride: 104 mEq/L (ref 96–112)
GFR calc Af Amer: 39 mL/min — ABNORMAL LOW (ref >90)
GFR calc non Af Amer: 34 mL/min — ABNORMAL LOW (ref >90)
Glucose, Bld: 42 mg/dL — CL (ref 70–99)
Potassium: 4 mEq/L (ref 3.7–5.3)
Sodium: 143 mEq/L (ref 137–147)

## 2014-04-09 MED ORDER — DEXTROSE 50 % IV SOLN
25.0000 mL | Freq: Once | INTRAVENOUS | Status: AC
  Administered 2014-04-09: 05:00:00 25 mL via INTRAVENOUS

## 2014-04-09 MED ORDER — INSULIN GLARGINE 100 UNIT/ML ~~LOC~~ SOLN
30.0000 [IU] | Freq: Every day | SUBCUTANEOUS | Status: DC
  Filled 2014-04-09 (×2): qty 0.3

## 2014-04-09 MED ORDER — DEXTROSE 50 % IV SOLN
INTRAVENOUS | Status: AC
  Filled 2014-04-09: qty 50

## 2014-04-09 MED ORDER — LIVING WELL WITH DIABETES BOOK
Freq: Once | Status: AC
  Administered 2014-04-09: 07:00:00
  Filled 2014-04-09: qty 1

## 2014-04-09 MED ORDER — INSULIN ASPART 100 UNIT/ML ~~LOC~~ SOLN
0.0000 [IU] | Freq: Three times a day (TID) | SUBCUTANEOUS | Status: DC
  Administered 2014-04-09 – 2014-04-11 (×4): 4 [IU] via SUBCUTANEOUS

## 2014-04-09 MED ORDER — INSULIN GLARGINE 100 UNIT/ML ~~LOC~~ SOLN
30.0000 [IU] | Freq: Every day | SUBCUTANEOUS | Status: DC
  Administered 2014-04-09 – 2014-04-10 (×2): 30 [IU] via SUBCUTANEOUS
  Filled 2014-04-09 (×3): qty 0.3

## 2014-04-09 MED ORDER — ASPIRIN EC 81 MG PO TBEC
162.0000 mg | DELAYED_RELEASE_TABLET | Freq: Every day | ORAL | Status: DC
  Administered 2014-04-09 – 2014-04-10 (×2): 162 mg via ORAL
  Filled 2014-04-09 (×3): qty 2

## 2014-04-09 MED ORDER — INSULIN ASPART 100 UNIT/ML ~~LOC~~ SOLN: 0.0000 [IU] | Freq: Every day | SUBCUTANEOUS | Status: DC

## 2014-04-09 MED ORDER — INSULIN ASPART 100 UNIT/ML ~~LOC~~ SOLN
1000.0000 [IU] | Freq: Once | SUBCUTANEOUS | Status: DC
  Filled 2014-04-09: qty 10

## 2014-04-09 MED ORDER — INSULIN ASPART 100 UNIT/ML ~~LOC~~ SOLN
10.0000 [IU] | Freq: Three times a day (TID) | SUBCUTANEOUS | Status: DC
  Administered 2014-04-09 – 2014-04-11 (×5): 10 [IU] via SUBCUTANEOUS

## 2014-04-09 NOTE — Progress Notes (Signed)
Hypoglycemic Event  CBG:32  Treatment: D50 IV 25 mL  Symptoms: None  Follow-up CBG: Time:05:45 CBG Result:130  Possible Reasons for Event: Other: insulin pump  Comments/MD notified:Dr. Burr Medico, Roselle Tiu  Remember to initiate Hypoglycemia Order Set & complete

## 2014-04-09 NOTE — Progress Notes (Addendum)
Patient Name: Reginald Duncan Date of Encounter: 04/09/2014     Active Problems:   Angina pectoris    SUBJECTIVE  Had some mild chest pain. States this pain is different from his previous MI symptom. Previous MI presented as SOB and pressure like sensation over chest, however this time, the pain is sharp stabbing like chest pain radiate to L flank. He has also been experiencing increasing SOB. He also complain of increasing dizziness and would feel like he's going to pass out after exertion.    CURRENT MEDS . ALPRAZolam  1 mg Oral BID  . amLODipine  10 mg Oral QHS  . aspirin  162.5 mg Oral Daily  . buPROPion  100 mg Oral QHS  . citalopram  40 mg Oral Daily  . clopidogrel  75 mg Oral Q breakfast  . fenofibrate  54 mg Oral Daily  . hydrALAZINE  20 mg Oral TID  . insulin aspart  0-20 Units Subcutaneous TID WC  . insulin pump   Subcutaneous TID AC, HS, 0200  . metoprolol  100 mg Oral QHS  . nortriptyline  25 mg Oral QHS  . pantoprazole  40 mg Oral Daily  . pregabalin  150 mg Oral QHS  . pregabalin  75 mg Oral Daily  . simvastatin  20 mg Oral q1800  . zolpidem  10 mg Oral QHS    OBJECTIVE  Filed Vitals:   04/08/14 1900 04/08/14 2000 04/08/14 2009 04/09/14 0001  BP: 119/45 139/68 139/68 115/70  Pulse:   88 75  Temp:   97.6 F (36.4 C) 98.1 F (36.7 C)  TempSrc:   Oral Oral  Resp:  20 15 13   Height:      Weight:    304 lb 10.8 oz (138.2 kg)  SpO2: 95% 95% 96% 95%    Intake/Output Summary (Last 24 hours) at 04/09/14 0645 Last data filed at 04/08/14 2230  Gross per 24 hour  Intake 1100.55 ml  Output    700 ml  Net 400.55 ml   Filed Weights   04/08/14 0614 04/09/14 0001  Weight: 304 lb (137.893 kg) 304 lb 10.8 oz (138.2 kg)    PHYSICAL EXAM  General: Pleasant, NAD. Neuro: Alert and oriented X 3. Moves all extremities spontaneously. Psych: Normal affect. HEENT:  Normal  Neck: Supple without bruits or JVD. Lungs:  Resp regular and unlabored, CTA. Heart: RRR  no s3, s4, or murmurs. Abdomen: Soft, non-tender, BS + x 4. Large pendulous abdomen, distended. Extremities: No clubbing, cyanosis or edema. DP/PT/Radials 2+ and equal bilaterally.  Accessory Clinical Findings  CBC No results found for this basename: WBC, NEUTROABS, HGB, HCT, MCV, PLT,  in the last 72 hours Basic Metabolic Panel  Recent Labs  04/08/14 1209 04/09/14 0333  NA 141 143  K 4.7 4.0  CL 103 104  CO2 23 23  GLUCOSE 87 42*  BUN 28* 30*  CREATININE 1.96* 2.01*  CALCIUM 9.8 9.2    TELE  NSR with HR 70-80s, no significant ventricular ectopy  ECG  NSR with HR 70s, nonspecific T wave inversion.  Radiology/Studies  No results found.  ASSESSMENT AND PLAN  1. Chest pain/dyspnea concerning for angina   - Cath 04/08/2014 prox LAD stent patent, mid LAD 30-40% diffuse stenosis, distal LAD chronically occluded with L to L collaterals from Diag. Recommend medical therapy  - unclear what causes his chest pain. His BP has been normal, and radial pulse 2+ bilaterally, dissection unlikely  - not sure  what further diagnostic test can be done to improve the patient's symptom. Would continue aggressive medical therapy for now  - possible discharge today  2. Intermittent dizziness - likely related to orthostatic  - nurse to obtain orthostatic pressure  3. CKD stage III-IV  3. Chronic back and knee pain  4. Morbid obesity  5. Diabetes mellitus, on insulin pump  6. Anxiety/depression with mild dementia  7. Prior h/o CVA  8. H/o PVD, details unclear 9. OSA, intolerant to CPAP due to claustrophobia     Hilbert Corrigan PA Pager: [phone] Patient seen and examined and history reviewed. Agree with above findings and plan. Atypical chest pain as noted above. No radial site hematoma. Became hypoglycemic last night. Improved today. He has been experiencing symptoms of lightheadedness and feeling he is going to pass out when doing any activity. It is unclear what dose of  hydralazine he is taking. ?10 vs 20 mg tid but he states he takes three tablets at once in the evening rather than tid. I would recommend reducing hydralazine to 10 mg bid and may need to discontinue completely if dizziness persists. Has not had assessment of LV function in 2 years-will arrange outpatient Echo. Renal function is stable post cath. Will DC home today.  Peter Martinique, [place] 04/09/2014 7:18 AM  Addendum: Patient now states he is too weak to go home today. He lives alone. Functional status is poor at baseline. Will ask PT and Case management to see for evaluation to assess DC needs. Will go ahead and get Echo today. BP low with orthostasis will hold hydralazine and monitor.  Peter Martinique MD, [facility]

## 2014-04-09 NOTE — Progress Notes (Signed)
Call received from RN that patient has run out of insulin in his pump.  He uses U500 insulin in his pump and has the U500 but has no reservoirs/sets for his insulin pump.  RN states that insulin pump is off now.  Due to high risk nature of U500, would not recommend continuation with SQ injections based on insulin pump settings.  Consider Novolog resistant correction q 4 hours and basal insulin while in the hospital (based on insulin pump settings). Diabetes Coordinator will see patient this afternoon to assess insulin pump/insulin needs.  Thanks, Adah Perl, RN, BC-ADM Inpatient Diabetes Coordinator Pager [phone]

## 2014-04-09 NOTE — Progress Notes (Signed)
PT Cancellation Note  Patient Details Name: Reginald Duncan MRN: [id number] DOB: 1951-10-07   Cancelled Treatment:    Reason Eval/Treat Not Completed: Patient at procedure or test/unavailable (Pt not in room to perform evaluation.  Will return as able.)   INGOLD,DAWN 04/09/2014, 12:27 PM [facility] [phone] (pager)

## 2014-04-09 NOTE — Progress Notes (Signed)
Pt. States is "running out of [place] U-500 insulin with which he fills his insulin pump.  He requested this insulin from pharmacy.  Pharmacist notified and insulin received to floor.  However, the patient does not have the disposable cylinders which he uses to fill the pump with.  Diabetic coordinator notified.  Pt. Is now off of the insulin pumpt due to  The fact that it is empty.  Staff to administer insulin as per patient's protocol.  For this reason, diabetic coordinator was notified.
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
## 2014-04-09 NOTE — Progress Notes (Signed)
Echo Lab  2D Echocardiogram completed.  [facility], [place] 04/09/2014 12:32 PM

## 2014-04-09 NOTE — Progress Notes (Signed)
Patient states he is very weak at this point, requiring nursing assistance to go to the bathroom. Will keep patient one more night, obtain echo and PT consult. Outpatient follow up has been scheduled with [facility] on 04/22/2014  SignedAlmyra Deforest PA Pager: [phone]

## 2014-04-10 DIAGNOSIS — I209 Angina pectoris, unspecified: Secondary | ICD-10-CM

## 2014-04-10 DIAGNOSIS — I2510 Atherosclerotic heart disease of native coronary artery without angina pectoris: Secondary | ICD-10-CM

## 2014-04-10 DIAGNOSIS — E119 Type 2 diabetes mellitus without complications: Secondary | ICD-10-CM

## 2014-04-10 LAB — GLUCOSE, CAPILLARY
GLUCOSE-CAPILLARY: 120 mg/dL — AB (ref 70–99)
Glucose-Capillary: 163 mg/dL — ABNORMAL HIGH (ref 70–99)
Glucose-Capillary: 192 mg/dL — ABNORMAL HIGH (ref 70–99)
Glucose-Capillary: 138 mg/dL — ABNORMAL HIGH (ref 70–99)

## 2014-04-10 LAB — BASIC METABOLIC PANEL
BUN: 24 mg/dL — ABNORMAL HIGH (ref 6–23)
CO2: 27 meq/L (ref 19–32)
CREATININE: 1.68 mg/dL — AB (ref 0.50–1.35)
Calcium: 9.7 mg/dL (ref 8.4–10.5)
Chloride: 100 mEq/L (ref 96–112)
GFR calc Af Amer: 49 mL/min — ABNORMAL LOW (ref >90)
GFR, EST NON AFRICAN AMERICAN: 42 mL/min — AB (ref >90)
Glucose, Bld: 182 mg/dL — ABNORMAL HIGH (ref 70–99)
Potassium: 4.4 mEq/L (ref 3.7–5.3)
Sodium: 141 mEq/L (ref 137–147)

## 2014-04-10 NOTE — Progress Notes (Signed)
Physical Therapy Treatment Patient Details Name: Reginald Duncan MRN: [id number] DOB: 04-15-1951 Today's Date: 04/10/2014    History of Present Illness Pt. was admitted for planned heart cath due to chest pain and dyspnea.  Pt. with h/o DM and neuropathy, CVA,MI, morbid obesity , dementia, chronic low back and L knee pain    PT Comments    Note that pt. Does not meet criteria for IP rehab.  He continues to have mobility and safety issues.  I have changed the DC recommendation to home with HHPT follow up since he will not be going to IP rehab.  He says he already has a RW at home.  I have reiterated to him that he is not strong enough right now to use the cane.   Follow Up Recommendations  Home health PT     Equipment Recommendations  None recommended by PT    Recommendations for Other Services       Precautions / Restrictions Precautions Precautions: Fall Precaution Comments: left knee buckles during walking, pt. able to self correct with support of RW Restrictions Weight Bearing Restrictions: No    Mobility  Bed Mobility Overal bed mobility: Modified Independent             General bed mobility comments: increased time, difficult and manages only with assist of bedrail  Transfers Overall transfer level: Needs assistance Equipment used: Rolling walker (2 wheeled) Transfers: Sit to/from Stand Sit to Stand: Min guard         General transfer comment: min guard for safety and needs increased time; difficult for him to rise to stand   Ambulation/Gait Ambulation/Gait assistance: Min guard Ambulation Distance (Feet): 40 Feet (40 x 2) Assistive device: Rolling walker (2 wheeled) Gait Pattern/deviations: Step-through pattern;Decreased step length - right;Decreased step length - left Gait velocity: decreased   General Gait Details: Pt. needed seated rest break before being able to return to his room with RW.  Pt. with left knee buckling on 2 occasions during walk.  Pt.  's arms fatigue quickly from using them to support self with RW   Stairs            Wheelchair Mobility    Modified Rankin (Stroke Patients Only)       Balance                                    Cognition Arousal/Alertness: Awake/alert Behavior During Therapy: WFL for tasks assessed/performed Overall Cognitive Status: Within Functional Limits for tasks assessed                      Exercises      General Comments        Pertinent Vitals/Pain See vitals tab HR and sats stable with walking    Home Living                      Prior Function            PT Goals (current goals can now be found in the care plan section) Progress towards PT goals: Progressing toward goals    Frequency  Min 3X/week    PT Plan Discharge plan needs to be updated    Co-evaluation             End of Session Equipment Utilized During Treatment: Gait belt Activity Tolerance: Patient limited by fatigue  Patient left: in bed;with call bell/phone within reach;with nursing/sitter in room     Time: 1625-1640 PT Time Calculation (min): 15 min  Charges:  $Gait Training: 8-22 mins                    G Codes:  Functional Assessment Tool Used: clincial observation and judgement Functional Limitation: Mobility: Walking and moving around Mobility: Walking and Moving Around Current Status [phone]): At least 40 percent but less than 60 percent impaired, limited or restricted Mobility: Walking and Moving Around Discharge Status [phone]): At least 40 percent but less than 60 percent impaired, limited or restricted (anticipate Dc today; pt. has DC orders in chart)   Ladona Ridgel 04/10/2014, 4:57 PM Gerlean Ren PT Acute Rehab Services [place] [phone]
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
## 2014-04-10 NOTE — Progress Notes (Signed)
Will check renal profile this AM. He is ready for discharge if no surprises on labs. Needs to get up and ambulate this AM. Please note the changes in hydralazine dose and the insulin therapy changes by DM coordinator

## 2014-04-10 NOTE — Evaluation (Signed)
Physical Therapy Evaluation Patient Details Name: Reginald Duncan MRN: [id number] DOB: Dec 21, 1950 Today's Date: 04/10/2014   History of Present Illness  Pt. was admitted for planned heart cath due to chest pain and dyspnea.  Pt. with h/o DM and neuropathy, CVA,MI, morbid obesity , dementia, chronic low back and L knee pain  Clinical Impression  Pt. Presents to PT with a decrease in his usual level of functional mobility due to deconditioning from this hospitalization, from generalized weakness and decreased activity tolerance.  He does not appear back to his usual functional baseline by his description .  He reports that he leaves his apartment and  drives his car to pick up fast food , does not grocery shop, and is independent within his apartment.  Has a brother who helps intermittently.  He would benefit from short IP rehab stay IF HE IS AGREEABLE  (he states he needs to get back to his apartment for a required inspection next week in order  to maintain his residence.  I am concerned for his safety if he DCs home today to his apartment.  If he chooses to DC home, he would benefit from HHPT  And  24 hour supervision initially (which is not available). I educated pt. That he is not stable enough right now to use his cane as an assistive device and that my strong recommendation is for him to use RW until cleared by HHPT.  He voices agreement.    Follow Up Recommendations CIR    Equipment Recommendations  None recommended by PT    Recommendations for Other Services Rehab consult     Precautions / Restrictions Precautions Precautions: Fall Restrictions Weight Bearing Restrictions: No      Mobility  Bed Mobility Overal bed mobility: Modified Independent             General bed mobility comments: needed to rely on bed rail and increased time  to assist self but did not need physical assist  Transfers Overall transfer level: Needs assistance Equipment used: Rolling walker (2  wheeled) Transfers: Sit to/from Stand Sit to Stand: Min assist;+2 safety/equipment         General transfer comment: Pt. needed min assist to safely rise to stand and for safe descent to recliner as he tends to sint in an uncontolled manner due to generalized weakness.    Ambulation/Gait Ambulation/Gait assistance: Min assist;+2 safety/equipment Ambulation Distance (Feet): 40 Feet (40' x 2 with seated rest necessary to recover in between) Assistive device: Rolling walker (2 wheeled) Gait Pattern/deviations: Step-through pattern;Decreased step length - right;Decreased step length - left;Antalgic Gait velocity: decreased   General Gait Details: Pt. needed several brief standing rest breaks and one seated rest break in order to walk 40' x 2.  Min assist needed to stabilize and balance.  Second person to bring recliner behind pt. but not for physical assist.    Stairs            Wheelchair Mobility    Modified Rankin (Stroke Patients Only)       Balance Overall balance assessment: Needs assistance Sitting-balance support: No upper extremity supported;Feet supported Sitting balance-Leahy Scale: Good     Standing balance support: Bilateral upper extremity supported;During functional activity Standing balance-Leahy Scale: Poor Standing balance comment: needed support of RW to maintain safe static standing                             Pertinent  Vitals/Pain See vitals tab sats stable with short distance ambulation but with 3/4 dyspnea with exertion    Home Living Family/patient expects to be discharged to:: Private residence Living Arrangements: Alone Available Help at Discharge: Family;Other (Comment);Available PRN/intermittently (brother) Type of Home: Apartment Home Access: Elevator     Home Layout: One level Home Equipment: Cane - single point;Walker - 2 wheels;Grab bars - tub/shower Additional Comments: Pt. reports he lives in [place] housing and is to have  an inspection next week in order to maintain these housing arrangements    Prior Function Level of Independence: Independent with assistive device(s)         Comments: Pt. uses cane or RW most of the time and depending on how he is feeling     Hand Dominance        Extremity/Trunk Assessment   Upper Extremity Assessment: Generalized weakness           Lower Extremity Assessment: Generalized weakness         Communication   Communication: No difficulties  Cognition Arousal/Alertness: Awake/alert Behavior During Therapy: WFL for tasks assessed/performed Overall Cognitive Status: Within Functional Limits for tasks assessed                      General Comments      Exercises        Assessment/Plan    PT Assessment Patient needs continued PT services  PT Diagnosis Difficulty walking;Generalized weakness   PT Problem List Decreased strength;Decreased activity tolerance;Decreased balance;Decreased mobility;Decreased knowledge of use of DME;Pain;Cardiopulmonary status limiting activity  PT Treatment Interventions DME instruction;Gait training;Functional mobility training;Therapeutic activities;Therapeutic exercise;Balance training;Patient/family education   PT Goals (Current goals can be found in the Care Plan section) Acute Rehab PT Goals Patient Stated Goal: pt. wants to get back home to his apartment before next week inspection PT Goal Formulation: With patient Time For Goal Achievement: 04/17/14 Potential to Achieve Goals: Good    Frequency Min 3X/week   Barriers to discharge Decreased caregiver support pt. lives alone ; reports he drove himself to the hospital and plans to drive himself home today if discharged    Co-evaluation               End of Session Equipment Utilized During Treatment: Gait belt Activity Tolerance: Patient limited by fatigue Patient left: in chair;with call bell/phone within reach Nurse Communication: Mobility  status    Functional Assessment Tool Used: clincial observation and judgement Functional Limitation: Mobility: Walking and moving around Mobility: Walking and Moving Around Current Status [id number]): At least 40 percent but less than 60 percent impaired, limited or restricted Mobility: Walking and Moving Around Goal Status [phone]): At least 40 percent but less than 60 percent impaired, limited or restricted    Time: [phone] PT Time Calculation (min): 36 min   Charges:   PT Evaluation $Initial PT Evaluation Tier I: 1 Procedure PT Treatments $Gait Training: 23-37 mins   PT G Codes:   Functional Assessment Tool Used: clincial observation and judgement Functional Limitation: Mobility: Walking and moving around    [place] 04/10/2014, 10:33 AM Gerlean Ren PT Acute Rehab Services [phone] [place] [phone]

## 2014-04-10 NOTE — Progress Notes (Signed)
Pt will not qualify for admission to inpt rehab per Dr. Naaman Plummer. I have contacted pt's bedside RN as well as RN CM to discuss. [phone]
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
## 2014-04-10 NOTE — Progress Notes (Signed)
Paged by nursing staff, patient was originally expected to be discharged to Inpatient Rehab as he refused SNF. However, after evaluation, patient did not qualify for inpatient rehab. After discussing with him potential alternatives, he continue to refuse SNF, however he agrees to be discharged home tomorrow morning with [facility].   Hilbert Corrigan PA Pager: [phone]

## 2014-04-10 NOTE — Progress Notes (Signed)
Agree with plans for SNF. Will consult CM.

## 2014-04-10 NOTE — Progress Notes (Signed)
   The patient was seen and examined earlier by Dr. Tamala Julian and was felt to be stable from a cardiac standpoint. However, after assessment by physical therapy earlier today, he was noted to have significant decrease in his usual level of functional mobility, due to deconditioning from this hospitalization, from generalized weakness and decreased activity tolerance. Unfortunately, the patient lives at home alone and has no assistance. Based on their assessment and given his difficult social situation, physical therapy felt that he would be an appropriate candidate for CIR. Dr. Tamala Julian was notified of the recommendations. A consult has been placed for further evaluation to see if he will qualify for CIR. Dispo pending.  Lyda Jester, PA-C

## 2014-04-10 NOTE — Progress Notes (Deleted)
Will check renal profile this AM. He is ready for discharge if no surprises on labs. Needs to get up and ambulate this AM. Please note the changes in hydralazine dose and the insulin therapy changes by DM coordinator.

## 2014-04-10 NOTE — Progress Notes (Signed)
Rehab Admissions Coordinator Note:  Patient was screened by Cleatrice Burke for appropriateness for an Inpatient Acute Rehab Consult per PT recommendation. At this time, we are recommending Inpatient Rehab consult. I will contact RN CM for obtaining order.   Cleatrice Burke 04/10/2014, 11:01 AM  I can be reached at [phone].

## 2014-04-11 ENCOUNTER — Other Ambulatory Visit: Payer: Self-pay | Admitting: Physician Assistant

## 2014-04-11 LAB — GLUCOSE, CAPILLARY: GLUCOSE-CAPILLARY: 184 mg/dL — AB (ref 70–99)

## 2014-04-11 MED ORDER — AMLODIPINE BESYLATE 5 MG PO TABS: 10.0000 mg | ORAL_TABLET | Freq: Every day | ORAL | Status: DC

## 2014-04-11 MED ORDER — ASPIRIN 162 MG PO TBEC: 162.0000 mg | DELAYED_RELEASE_TABLET | Freq: Every day | ORAL | Status: DC
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
## 2014-04-11 NOTE — Progress Notes (Signed)
SUBJECTIVE:  No chest pain. No SOB.  Walked yesterday     PHYSICAL EXAM Filed Vitals:   04/10/14 2000 04/11/14 0026 04/11/14 0507 04/11/14 0733  BP: 122/75 128/75 95/44 87/53   Pulse: 69 61 57 61  Temp: 97.5 F (36.4 C) 97.2 F (36.2 C) 97.6 F (36.4 C) 97.7 F (36.5 C)  TempSrc: Oral Oral Oral Oral  Resp: 18 17 17 18   Height:      Weight:  302 lb 11.1 oz (137.3 kg)    SpO2: 94% 96% 92% 93%   General:  No distress Lungs:  Clear Heart:  RRR Abdomen:  Positive bowel sounds, no rebound no guarding Extremities:  No edema, right wrist OK.    LABS:  Results for orders placed during the hospital encounter of 04/08/14 (from the past 24 hour(s))  GLUCOSE, CAPILLARY     Status: Abnormal   Collection Time    04/10/14  8:19 AM      Result Value Ref Range   Glucose-Capillary 163 (*) 70 - 99 mg/dL   Comment 1 Notify RN    BASIC METABOLIC PANEL     Status: Abnormal   Collection Time    04/10/14  9:25 AM      Result Value Ref Range   Sodium 141  137 - 147 mEq/L   Potassium 4.4  3.7 - 5.3 mEq/L   Chloride 100  96 - 112 mEq/L   CO2 27  19 - 32 mEq/L   Glucose, Bld 182 (*) 70 - 99 mg/dL   BUN 24 (*) 6 - 23 mg/dL   Creatinine, Ser 1.68 (*) 0.50 - 1.35 mg/dL   Calcium 9.7  8.4 - 10.5 mg/dL   GFR calc non Af Amer 42 (*) >90 mL/min   GFR calc Af Amer 49 (*) >90 mL/min  GLUCOSE, CAPILLARY     Status: Abnormal   Collection Time    04/10/14 12:54 PM      Result Value Ref Range   Glucose-Capillary 192 (*) 70 - 99 mg/dL   Comment 1 Notify RN    GLUCOSE, CAPILLARY     Status: Abnormal   Collection Time    04/10/14  5:33 PM      Result Value Ref Range   Glucose-Capillary 120 (*) 70 - 99 mg/dL   Comment 1 Notify RN    GLUCOSE, CAPILLARY     Status: Abnormal   Collection Time    04/10/14  9:27 PM      Result Value Ref Range   Glucose-Capillary 138 (*) 70 - 99 mg/dL   Comment 1 Notify RN      Intake/Output Summary (Last 24 hours) at 04/11/14 0748 Last data filed at 04/11/14  0341  Gross per 24 hour  Intake    900 ml  Output   1600 ml  Net   -700 ml    ASSESSMENT AND PLAN:  Unstable angina:  Cath 04/08/2014 prox LAD stent patent, mid LAD 30-40% diffuse stenosis, distal LAD chronically occluded with L to L collaterals from Diag. Continue medical therapy.    CKD stage III-IV:  Creat was stable yesterday.  Continue current therapy.    Hypertension: Blood pressure slightly low.  However, no symptoms.  Ambulate today before discharge.  Reduce amlodipine to 5 mg at discharge.   Diabetes: Diabetes coordinator recommended changing to Lantus and NovoLog. PCP to follow as an outpatient.   Dispo: Plan for discharge home    Minus Breeding 04/11/2014 7:48 AM

## 2014-04-17 ENCOUNTER — Other Ambulatory Visit (HOSPITAL_COMMUNITY): Payer: Medicare Other

## 2014-04-20 ENCOUNTER — Telehealth: Payer: Self-pay | Admitting: Internal Medicine

## 2014-04-20 NOTE — Telephone Encounter (Signed)
Spoke with Aldine, pt would not let him come out to the house.

## 2014-04-20 NOTE — Telephone Encounter (Signed)
New Message'  Physical therapist called states that the pt has refused physical therapy services. Please call

## 2014-04-21 ENCOUNTER — Telehealth: Payer: Self-pay | Admitting: *Deleted

## 2014-04-21 NOTE — Telephone Encounter (Signed)
Left message on identified voicemail that Dr. Harrington Challenger was made aware that patient has refused physical therapy services.

## 2014-04-22 ENCOUNTER — Ambulatory Visit: Payer: Medicare Other | Admitting: Cardiology

## 2014-05-12 ENCOUNTER — Ambulatory Visit: Payer: Medicare Other | Admitting: Cardiology

## 2014-06-30 ENCOUNTER — Telehealth: Payer: Self-pay | Admitting: Internal Medicine
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
## 2014-06-30 NOTE — Telephone Encounter (Signed)
New message    Cath done about month ago - asking for test results.

## 2014-07-06 ENCOUNTER — Other Ambulatory Visit: Payer: Self-pay | Admitting: Nurse Practitioner

## 2014-09-07 ENCOUNTER — Encounter: Payer: Medicare Other | Admitting: Internal Medicine

## 2014-09-07 NOTE — Progress Notes (Signed)
This encounter was created in error - please disregard.

## 2014-10-01 ENCOUNTER — Encounter (HOSPITAL_COMMUNITY): Payer: Self-pay | Admitting: Cardiology

## 2014-10-05 ENCOUNTER — Ambulatory Visit: Payer: Medicare Other | Admitting: Internal Medicine

## 2014-10-05 ENCOUNTER — Ambulatory Visit (INDEPENDENT_AMBULATORY_CARE_PROVIDER_SITE_OTHER): Payer: Commercial Managed Care - HMO | Admitting: Internal Medicine

## 2014-10-05 ENCOUNTER — Encounter: Payer: Self-pay | Admitting: Internal Medicine

## 2014-10-05 VITALS — BP 112/76 | HR 75 | Ht 73.0 in | Wt 279.4 lb

## 2014-10-05 DIAGNOSIS — I2510 Atherosclerotic heart disease of native coronary artery without angina pectoris: Secondary | ICD-10-CM

## 2014-10-05 DIAGNOSIS — E7800 Pure hypercholesterolemia, unspecified: Secondary | ICD-10-CM

## 2014-10-05 DIAGNOSIS — I10 Essential (primary) hypertension: Secondary | ICD-10-CM

## 2014-10-05 MED ORDER — NITROGLYCERIN 0.4 MG SL SUBL: 0.4000 mg | SUBLINGUAL_TABLET | SUBLINGUAL | Status: AC | PRN
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
## 2014-10-05 NOTE — Patient Instructions (Signed)
Your physician wants you to follow-up in: months with Dr. Theressa Stamps will receive a reminder letter in the mail two months in advance. If you don't receive a letter, please call our office to schedule the follow-up appointment.

## 2014-10-10 ENCOUNTER — Other Ambulatory Visit: Payer: Self-pay | Admitting: Internal Medicine

## 2014-10-27 DIAGNOSIS — J209 Acute bronchitis, unspecified: Secondary | ICD-10-CM | POA: Diagnosis not present

## 2014-10-27 DIAGNOSIS — R0602 Shortness of breath: Secondary | ICD-10-CM | POA: Diagnosis not present

## 2014-10-27 DIAGNOSIS — J449 Chronic obstructive pulmonary disease, unspecified: Secondary | ICD-10-CM | POA: Diagnosis not present

## 2014-10-27 DIAGNOSIS — J9809 Other diseases of bronchus, not elsewhere classified: Secondary | ICD-10-CM | POA: Diagnosis not present

## 2014-11-09 DIAGNOSIS — E109 Type 1 diabetes mellitus without complications: Secondary | ICD-10-CM | POA: Diagnosis not present

## 2014-11-20 DIAGNOSIS — R634 Abnormal weight loss: Secondary | ICD-10-CM | POA: Diagnosis not present

## 2014-11-20 DIAGNOSIS — K219 Gastro-esophageal reflux disease without esophagitis: Secondary | ICD-10-CM | POA: Diagnosis not present

## 2014-12-10 DIAGNOSIS — E109 Type 1 diabetes mellitus without complications: Secondary | ICD-10-CM | POA: Diagnosis not present

## 2014-12-22 DIAGNOSIS — M25561 Pain in right knee: Secondary | ICD-10-CM | POA: Diagnosis not present

## 2014-12-22 DIAGNOSIS — Z794 Long term (current) use of insulin: Secondary | ICD-10-CM | POA: Diagnosis not present

## 2014-12-22 DIAGNOSIS — S8002XA Contusion of left knee, initial encounter: Secondary | ICD-10-CM | POA: Diagnosis not present

## 2014-12-22 DIAGNOSIS — R0789 Other chest pain: Secondary | ICD-10-CM | POA: Diagnosis not present

## 2014-12-22 DIAGNOSIS — E780 Pure hypercholesterolemia: Secondary | ICD-10-CM | POA: Diagnosis not present

## 2014-12-22 DIAGNOSIS — Z7982 Long term (current) use of aspirin: Secondary | ICD-10-CM | POA: Diagnosis not present

## 2014-12-22 DIAGNOSIS — M25562 Pain in left knee: Secondary | ICD-10-CM | POA: Diagnosis not present

## 2014-12-22 DIAGNOSIS — S8001XA Contusion of right knee, initial encounter: Secondary | ICD-10-CM | POA: Diagnosis not present

## 2014-12-22 DIAGNOSIS — I10 Essential (primary) hypertension: Secondary | ICD-10-CM | POA: Diagnosis not present

## 2014-12-22 DIAGNOSIS — S7001XA Contusion of right hip, initial encounter: Secondary | ICD-10-CM | POA: Diagnosis not present

## 2014-12-22 DIAGNOSIS — S8992XA Unspecified injury of left lower leg, initial encounter: Secondary | ICD-10-CM | POA: Diagnosis not present

## 2014-12-22 DIAGNOSIS — S8991XA Unspecified injury of right lower leg, initial encounter: Secondary | ICD-10-CM | POA: Diagnosis not present

## 2014-12-22 DIAGNOSIS — S79911A Unspecified injury of right hip, initial encounter: Secondary | ICD-10-CM | POA: Diagnosis not present

## 2014-12-22 DIAGNOSIS — E119 Type 2 diabetes mellitus without complications: Secondary | ICD-10-CM | POA: Diagnosis not present

## 2014-12-22 DIAGNOSIS — S299XXA Unspecified injury of thorax, initial encounter: Secondary | ICD-10-CM | POA: Diagnosis not present

## 2014-12-23 DIAGNOSIS — S8002XA Contusion of left knee, initial encounter: Secondary | ICD-10-CM | POA: Diagnosis not present

## 2014-12-23 DIAGNOSIS — S8992XA Unspecified injury of left lower leg, initial encounter: Secondary | ICD-10-CM | POA: Diagnosis not present

## 2014-12-23 DIAGNOSIS — M25562 Pain in left knee: Secondary | ICD-10-CM | POA: Diagnosis not present

## 2014-12-23 DIAGNOSIS — S7001XA Contusion of right hip, initial encounter: Secondary | ICD-10-CM | POA: Diagnosis not present

## 2014-12-23 DIAGNOSIS — S79911A Unspecified injury of right hip, initial encounter: Secondary | ICD-10-CM | POA: Diagnosis not present

## 2014-12-23 DIAGNOSIS — S8001XA Contusion of right knee, initial encounter: Secondary | ICD-10-CM | POA: Diagnosis not present

## 2014-12-23 DIAGNOSIS — I10 Essential (primary) hypertension: Secondary | ICD-10-CM | POA: Diagnosis not present

## 2014-12-23 DIAGNOSIS — S299XXA Unspecified injury of thorax, initial encounter: Secondary | ICD-10-CM | POA: Diagnosis not present

## 2014-12-23 DIAGNOSIS — E119 Type 2 diabetes mellitus without complications: Secondary | ICD-10-CM | POA: Diagnosis not present

## 2014-12-23 DIAGNOSIS — R0789 Other chest pain: Secondary | ICD-10-CM | POA: Diagnosis not present

## 2014-12-23 DIAGNOSIS — M25561 Pain in right knee: Secondary | ICD-10-CM | POA: Diagnosis not present

## 2014-12-23 DIAGNOSIS — Z794 Long term (current) use of insulin: Secondary | ICD-10-CM | POA: Diagnosis not present

## 2014-12-23 DIAGNOSIS — S8991XA Unspecified injury of right lower leg, initial encounter: Secondary | ICD-10-CM | POA: Diagnosis not present

## 2014-12-23 DIAGNOSIS — E780 Pure hypercholesterolemia: Secondary | ICD-10-CM | POA: Diagnosis not present

## 2014-12-23 DIAGNOSIS — Z7982 Long term (current) use of aspirin: Secondary | ICD-10-CM | POA: Diagnosis not present

## 2015-01-13 DIAGNOSIS — I25118 Atherosclerotic heart disease of native coronary artery with other forms of angina pectoris: Secondary | ICD-10-CM | POA: Diagnosis not present

## 2015-01-13 DIAGNOSIS — F331 Major depressive disorder, recurrent, moderate: Secondary | ICD-10-CM | POA: Diagnosis not present

## 2015-01-13 DIAGNOSIS — I1310 Hypertensive heart and chronic kidney disease without heart failure, with stage 1 through stage 4 chronic kidney disease, or unspecified chronic kidney disease: Secondary | ICD-10-CM | POA: Diagnosis not present

## 2015-01-13 DIAGNOSIS — K219 Gastro-esophageal reflux disease without esophagitis: Secondary | ICD-10-CM | POA: Diagnosis not present

## 2015-01-13 DIAGNOSIS — E782 Mixed hyperlipidemia: Secondary | ICD-10-CM | POA: Diagnosis not present

## 2015-01-13 DIAGNOSIS — N183 Chronic kidney disease, stage 3 (moderate): Secondary | ICD-10-CM | POA: Diagnosis not present

## 2015-01-13 DIAGNOSIS — F5101 Primary insomnia: Secondary | ICD-10-CM | POA: Diagnosis not present

## 2015-01-13 DIAGNOSIS — I10 Essential (primary) hypertension: Secondary | ICD-10-CM | POA: Diagnosis not present

## 2015-01-13 DIAGNOSIS — E1140 Type 2 diabetes mellitus with diabetic neuropathy, unspecified: Secondary | ICD-10-CM | POA: Diagnosis not present

## 2015-01-19 DIAGNOSIS — E109 Type 1 diabetes mellitus without complications: Secondary | ICD-10-CM | POA: Diagnosis not present

## 2015-01-30 DIAGNOSIS — Z7982 Long term (current) use of aspirin: Secondary | ICD-10-CM | POA: Diagnosis not present

## 2015-01-30 DIAGNOSIS — R0789 Other chest pain: Secondary | ICD-10-CM | POA: Diagnosis not present

## 2015-01-30 DIAGNOSIS — R079 Chest pain, unspecified: Secondary | ICD-10-CM | POA: Diagnosis not present

## 2015-01-30 DIAGNOSIS — E119 Type 2 diabetes mellitus without complications: Secondary | ICD-10-CM | POA: Diagnosis not present

## 2015-01-30 DIAGNOSIS — Z794 Long term (current) use of insulin: Secondary | ICD-10-CM | POA: Diagnosis not present

## 2015-01-30 DIAGNOSIS — R072 Precordial pain: Secondary | ICD-10-CM | POA: Diagnosis not present

## 2015-01-30 DIAGNOSIS — J449 Chronic obstructive pulmonary disease, unspecified: Secondary | ICD-10-CM | POA: Diagnosis not present

## 2015-01-30 DIAGNOSIS — I10 Essential (primary) hypertension: Secondary | ICD-10-CM | POA: Diagnosis not present

## 2015-01-30 DIAGNOSIS — Z8673 Personal history of transient ischemic attack (TIA), and cerebral infarction without residual deficits: Secondary | ICD-10-CM | POA: Diagnosis not present

## 2015-01-30 DIAGNOSIS — E780 Pure hypercholesterolemia: Secondary | ICD-10-CM | POA: Diagnosis not present

## 2015-02-08 DIAGNOSIS — I129 Hypertensive chronic kidney disease with stage 1 through stage 4 chronic kidney disease, or unspecified chronic kidney disease: Secondary | ICD-10-CM | POA: Diagnosis not present

## 2015-02-08 DIAGNOSIS — N2581 Secondary hyperparathyroidism of renal origin: Secondary | ICD-10-CM | POA: Diagnosis not present

## 2015-02-08 DIAGNOSIS — E119 Type 2 diabetes mellitus without complications: Secondary | ICD-10-CM | POA: Diagnosis not present

## 2015-02-08 DIAGNOSIS — N183 Chronic kidney disease, stage 3 (moderate): Secondary | ICD-10-CM | POA: Diagnosis not present

## 2015-03-15 ENCOUNTER — Other Ambulatory Visit: Payer: Self-pay

## 2015-03-15 MED ORDER — CLOPIDOGREL BISULFATE 75 MG PO TABS: ORAL_TABLET | ORAL | Status: AC

## 2015-03-15 NOTE — Telephone Encounter (Signed)
Patient called stating that he could not afford to get his plavix refill because he had to pay out of pocket his insurance was cancel and will not be able to get any until June 1st. So I called [facility] patient pharmacy to see how much it was and it cost 9 dollars for a 30 days supply. So I called the patient  back to let him know and he was ok with this and I called the pharmacy to placed a refill for the plavix and he was coming to get it.

## 2015-04-15 DIAGNOSIS — F331 Major depressive disorder, recurrent, moderate: Secondary | ICD-10-CM | POA: Diagnosis not present

## 2015-04-15 DIAGNOSIS — N183 Chronic kidney disease, stage 3 (moderate): Secondary | ICD-10-CM | POA: Diagnosis not present

## 2015-04-15 DIAGNOSIS — K219 Gastro-esophageal reflux disease without esophagitis: Secondary | ICD-10-CM | POA: Diagnosis not present

## 2015-04-15 DIAGNOSIS — Z4681 Encounter for fitting and adjustment of insulin pump: Secondary | ICD-10-CM | POA: Diagnosis not present

## 2015-04-15 DIAGNOSIS — E782 Mixed hyperlipidemia: Secondary | ICD-10-CM | POA: Diagnosis not present

## 2015-04-15 DIAGNOSIS — I25118 Atherosclerotic heart disease of native coronary artery with other forms of angina pectoris: Secondary | ICD-10-CM | POA: Diagnosis not present

## 2015-04-15 DIAGNOSIS — I1310 Hypertensive heart and chronic kidney disease without heart failure, with stage 1 through stage 4 chronic kidney disease, or unspecified chronic kidney disease: Secondary | ICD-10-CM | POA: Diagnosis not present

## 2015-04-15 DIAGNOSIS — E1140 Type 2 diabetes mellitus with diabetic neuropathy, unspecified: Secondary | ICD-10-CM | POA: Diagnosis not present

## 2015-05-03 DIAGNOSIS — E109 Type 1 diabetes mellitus without complications: Secondary | ICD-10-CM | POA: Diagnosis not present

## 2015-07-20 DIAGNOSIS — N183 Chronic kidney disease, stage 3 (moderate): Secondary | ICD-10-CM | POA: Diagnosis not present

## 2015-07-20 DIAGNOSIS — Z6838 Body mass index (BMI) 38.0-38.9, adult: Secondary | ICD-10-CM | POA: Diagnosis not present

## 2015-07-20 DIAGNOSIS — K219 Gastro-esophageal reflux disease without esophagitis: Secondary | ICD-10-CM | POA: Diagnosis not present

## 2015-07-20 DIAGNOSIS — E1140 Type 2 diabetes mellitus with diabetic neuropathy, unspecified: Secondary | ICD-10-CM | POA: Diagnosis not present

## 2015-07-20 DIAGNOSIS — E782 Mixed hyperlipidemia: Secondary | ICD-10-CM | POA: Diagnosis not present

## 2015-07-20 DIAGNOSIS — I25118 Atherosclerotic heart disease of native coronary artery with other forms of angina pectoris: Secondary | ICD-10-CM | POA: Diagnosis not present

## 2015-07-20 DIAGNOSIS — I1310 Hypertensive heart and chronic kidney disease without heart failure, with stage 1 through stage 4 chronic kidney disease, or unspecified chronic kidney disease: Secondary | ICD-10-CM | POA: Diagnosis not present

## 2015-07-20 DIAGNOSIS — Z4681 Encounter for fitting and adjustment of insulin pump: Secondary | ICD-10-CM | POA: Diagnosis not present

## 2015-08-02 DIAGNOSIS — I6523 Occlusion and stenosis of bilateral carotid arteries: Secondary | ICD-10-CM | POA: Diagnosis not present

## 2015-08-02 DIAGNOSIS — E119 Type 2 diabetes mellitus without complications: Secondary | ICD-10-CM | POA: Diagnosis not present

## 2015-08-02 DIAGNOSIS — I10 Essential (primary) hypertension: Secondary | ICD-10-CM | POA: Diagnosis not present

## 2015-08-02 DIAGNOSIS — I25118 Atherosclerotic heart disease of native coronary artery with other forms of angina pectoris: Secondary | ICD-10-CM | POA: Diagnosis not present

## 2015-08-02 DIAGNOSIS — R51 Headache: Secondary | ICD-10-CM | POA: Diagnosis not present

## 2015-08-23 DIAGNOSIS — E109 Type 1 diabetes mellitus without complications: Secondary | ICD-10-CM | POA: Diagnosis not present

## 2015-09-10 DIAGNOSIS — I252 Old myocardial infarction: Secondary | ICD-10-CM | POA: Diagnosis not present

## 2015-09-10 DIAGNOSIS — R109 Unspecified abdominal pain: Secondary | ICD-10-CM | POA: Diagnosis not present

## 2015-09-10 DIAGNOSIS — Z794 Long term (current) use of insulin: Secondary | ICD-10-CM | POA: Diagnosis not present

## 2015-09-10 DIAGNOSIS — I2510 Atherosclerotic heart disease of native coronary artery without angina pectoris: Secondary | ICD-10-CM | POA: Diagnosis not present

## 2015-09-10 DIAGNOSIS — R1013 Epigastric pain: Secondary | ICD-10-CM | POA: Diagnosis not present

## 2015-09-10 DIAGNOSIS — E119 Type 2 diabetes mellitus without complications: Secondary | ICD-10-CM | POA: Diagnosis not present

## 2015-09-10 DIAGNOSIS — J449 Chronic obstructive pulmonary disease, unspecified: Secondary | ICD-10-CM | POA: Diagnosis not present

## 2015-09-10 DIAGNOSIS — K8050 Calculus of bile duct without cholangitis or cholecystitis without obstruction: Secondary | ICD-10-CM | POA: Diagnosis not present

## 2015-09-10 DIAGNOSIS — I10 Essential (primary) hypertension: Secondary | ICD-10-CM | POA: Diagnosis not present

## 2015-09-10 DIAGNOSIS — R079 Chest pain, unspecified: Secondary | ICD-10-CM | POA: Diagnosis not present

## 2015-09-10 DIAGNOSIS — F419 Anxiety disorder, unspecified: Secondary | ICD-10-CM | POA: Diagnosis not present

## 2015-09-10 DIAGNOSIS — R0789 Other chest pain: Secondary | ICD-10-CM | POA: Diagnosis not present

## 2015-09-10 DIAGNOSIS — K8020 Calculus of gallbladder without cholecystitis without obstruction: Secondary | ICD-10-CM | POA: Diagnosis not present

## 2015-09-10 DIAGNOSIS — E7800 Pure hypercholesterolemia, unspecified: Secondary | ICD-10-CM | POA: Diagnosis not present

## 2015-09-11 DIAGNOSIS — J449 Chronic obstructive pulmonary disease, unspecified: Secondary | ICD-10-CM | POA: Diagnosis not present

## 2015-09-11 DIAGNOSIS — I10 Essential (primary) hypertension: Secondary | ICD-10-CM | POA: Diagnosis not present

## 2015-09-11 DIAGNOSIS — E7800 Pure hypercholesterolemia, unspecified: Secondary | ICD-10-CM | POA: Diagnosis not present

## 2015-09-11 DIAGNOSIS — F419 Anxiety disorder, unspecified: Secondary | ICD-10-CM | POA: Diagnosis not present

## 2015-09-11 DIAGNOSIS — K8050 Calculus of bile duct without cholangitis or cholecystitis without obstruction: Secondary | ICD-10-CM | POA: Diagnosis not present

## 2015-09-11 DIAGNOSIS — R109 Unspecified abdominal pain: Secondary | ICD-10-CM | POA: Diagnosis not present

## 2015-09-11 DIAGNOSIS — E119 Type 2 diabetes mellitus without complications: Secondary | ICD-10-CM | POA: Diagnosis not present

## 2015-09-11 DIAGNOSIS — R079 Chest pain, unspecified: Secondary | ICD-10-CM | POA: Diagnosis not present

## 2015-09-11 DIAGNOSIS — I2510 Atherosclerotic heart disease of native coronary artery without angina pectoris: Secondary | ICD-10-CM | POA: Diagnosis not present

## 2015-09-11 DIAGNOSIS — I252 Old myocardial infarction: Secondary | ICD-10-CM | POA: Diagnosis not present

## 2015-09-11 DIAGNOSIS — Z794 Long term (current) use of insulin: Secondary | ICD-10-CM | POA: Diagnosis not present

## 2015-09-20 ENCOUNTER — Ambulatory Visit (INDEPENDENT_AMBULATORY_CARE_PROVIDER_SITE_OTHER): Payer: Commercial Managed Care - HMO | Admitting: Internal Medicine

## 2015-09-20 ENCOUNTER — Encounter: Payer: Self-pay | Admitting: Internal Medicine

## 2015-09-20 VITALS — BP 132/80 | HR 87 | Ht 73.0 in | Wt 306.4 lb

## 2015-09-20 DIAGNOSIS — I509 Heart failure, unspecified: Secondary | ICD-10-CM | POA: Diagnosis not present

## 2015-09-20 DIAGNOSIS — I2510 Atherosclerotic heart disease of native coronary artery without angina pectoris: Secondary | ICD-10-CM | POA: Diagnosis not present

## 2015-09-20 LAB — CBC WITH DIFFERENTIAL/PLATELET
Basophils Absolute: 0.1 10*3/uL (ref 0.0–0.1)
Basophils Relative: 1 % (ref 0–1)
EOS PCT: 7 % — AB (ref 0–5)
Eosinophils Absolute: 0.4 10*3/uL (ref 0.0–0.7)
HCT: 36.4 % — ABNORMAL LOW (ref 39.0–52.0)
Hemoglobin: 12.4 g/dL — ABNORMAL LOW (ref 13.0–17.0)
LYMPHS ABS: 1.2 10*3/uL (ref 0.7–4.0)
Lymphocytes Relative: 19 % (ref 12–46)
MCH: 30.7 pg (ref 26.0–34.0)
MCHC: 34.1 g/dL (ref 30.0–36.0)
MCV: 90.1 fL (ref 78.0–100.0)
MPV: 10.1 fL (ref 8.6–12.4)
Monocytes Absolute: 0.7 10*3/uL (ref 0.1–1.0)
Monocytes Relative: 11 % (ref 3–12)
Neutro Abs: 4 10*3/uL (ref 1.7–7.7)
Neutrophils Relative %: 62 % (ref 43–77)
PLATELETS: 210 10*3/uL (ref 150–400)
RBC: 4.04 MIL/uL — AB (ref 4.22–5.81)
RDW: 15 % (ref 11.5–15.5)
WBC: 6.4 10*3/uL (ref 4.0–10.5)

## 2015-09-20 NOTE — Patient Instructions (Signed)
Medication Instructions:  No changes today, may make changes based on lab results  Labwork: BMET/BNP/CBCd today  Testing/Procedures: None today  Follow-Up: Follow-up with Dr Harrington Challenger will be determined based on lab results, we will call you once Dr Harrington Challenger has reviewed the lab results.        If you need a refill on your cardiac medications before your next appointment, please call your pharmacy.
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
## 2015-09-21 ENCOUNTER — Telehealth: Payer: Self-pay | Admitting: *Deleted

## 2015-09-21 LAB — BASIC METABOLIC PANEL
BUN: 36 mg/dL — ABNORMAL HIGH (ref 7–25)
CO2: 27 mmol/L (ref 20–31)
CREATININE: 2.03 mg/dL — AB (ref 0.70–1.25)
Calcium: 9.7 mg/dL (ref 8.6–10.3)
Chloride: 107 mmol/L (ref 98–110)
Glucose, Bld: 32 mg/dL — CL (ref 65–99)
Potassium: 4.1 mmol/L (ref 3.5–5.3)
SODIUM: 142 mmol/L (ref 135–146)

## 2015-09-21 LAB — BRAIN NATRIURETIC PEPTIDE: BRAIN NATRIURETIC PEPTIDE: 11.3 pg/mL (ref 0.0–100.0)

## 2015-09-21 NOTE — Telephone Encounter (Signed)
Claiborne Billings, tech from [employer] with a critical lab result on this pt, from [place] appt at our office.  Pt had a bmet done and his BS showed critically low at 32 mg/dl.  Per Claiborne Billings at the lab, this result was repeated and verified.  Called the pt and no answer so I left a VM to call back.  Showed the [employer], the pts critical lab result and per Nicki Reaper, he advised calling the pt back and leaving him a detailed message for him to call our office back within the hour, or we will be sending EMS to his house to make sure he is ok.  Left the pt a VM for him to call our office back immediately to inform us that he is ok, or we would need to send EMS to his house to check on him.  Pt called back within 5 minutes of this message and stated that he did feel really "funny" at the time we were drawing his blood.  Pt also stated that he did fast for his afternoon appt and he does have a insulin pump, so there was no effective coverage.  Pt states that he drove home after his OV with Dr Harrington Challenger and noted that his vision became blurry and he wasn't making "clear thoughts." Pt states he left his CBG machine at home by accident, but he assumed it was his BS dropping, so he went to cookout and ate some food.  Pt states that after he ate, he then started feeling much better.  Advised the pt that he should always carry snacks on him at all times, for situations like this.  Advised the pt that he should also carry his CBG machine with him as well, to continuously monitor his BS. Advised the pt to never, ever fast for an afternoon appt, for this is too dangerous for him and being a brittle diabetic.  Informed the pt that if he ever wants clarification on whether he should fast or not for an appt, he should call our office the day before, and we can advise on that.  Informed the pt that the safest way for him to schedule an appt with our office, is to have his appt scheduled for the morning time.  Pt  reports that his BS is stable as of right now, and he is feeling much better.  Pt verbalized understanding of all instructions given.  Informed the pt that I will send this message to Dr Harrington Challenger and nurse for their review.  Pt verbalized understanding and gracious for all the help provided.

## 2015-09-23 ENCOUNTER — Telehealth: Payer: Self-pay | Admitting: Internal Medicine

## 2015-09-23 NOTE — Telephone Encounter (Signed)
New Message    Pt calling to get his lab results. Please call back and advise.

## 2015-09-24 NOTE — Telephone Encounter (Signed)
I spoke with the patient and reviewed his lab results, informing him that Dr. Harrington Challenger has not reviewed yet and if she has new recommendations for him I will call him back to inform.  He requested that his his labs be forwarded to Dr. Lorrene Reid at [facility], which I will do once they are reviewed by Dr. Harrington Challenger.

## 2015-10-13 ENCOUNTER — Telehealth: Payer: Self-pay | Admitting: *Deleted

## 2015-10-13 MED ORDER — ISOSORBIDE MONONITRATE ER 30 MG PO TB24: 30.0000 mg | ORAL_TABLET | Freq: Every day | ORAL | Status: DC

## 2015-10-13 NOTE — Telephone Encounter (Signed)
Spoke with patient Informed to start IMDUR 30 mg daily. Appointment made for f/u Feb 6.  He reports being unable to sleep.   Taking Ambien.  Recommended he discuss this with his PCP, there may be an alternative to try.

## 2015-10-13 NOTE — Telephone Encounter (Signed)
-----   Message from Dorris Carnes V, MD sent at 10/12/2015 11:38 AM EST ----- Would recomm adding imdur to medicine regimen to optimize blood flow to all regions of heart. Recomm this to start before any further testing (given renal function.  Please have pt follow up in 6 wks

## 2015-10-16 ENCOUNTER — Other Ambulatory Visit: Payer: Self-pay | Admitting: Internal Medicine

## 2015-10-29 DIAGNOSIS — I25118 Atherosclerotic heart disease of native coronary artery with other forms of angina pectoris: Secondary | ICD-10-CM | POA: Diagnosis not present

## 2015-10-29 DIAGNOSIS — Z4681 Encounter for fitting and adjustment of insulin pump: Secondary | ICD-10-CM | POA: Diagnosis not present

## 2015-10-29 DIAGNOSIS — E1140 Type 2 diabetes mellitus with diabetic neuropathy, unspecified: Secondary | ICD-10-CM | POA: Diagnosis not present

## 2015-10-29 DIAGNOSIS — I1310 Hypertensive heart and chronic kidney disease without heart failure, with stage 1 through stage 4 chronic kidney disease, or unspecified chronic kidney disease: Secondary | ICD-10-CM | POA: Diagnosis not present

## 2015-10-29 DIAGNOSIS — J449 Chronic obstructive pulmonary disease, unspecified: Secondary | ICD-10-CM | POA: Diagnosis not present

## 2015-10-29 DIAGNOSIS — F331 Major depressive disorder, recurrent, moderate: Secondary | ICD-10-CM | POA: Diagnosis not present

## 2015-10-29 DIAGNOSIS — N183 Chronic kidney disease, stage 3 (moderate): Secondary | ICD-10-CM | POA: Diagnosis not present

## 2015-10-29 DIAGNOSIS — E782 Mixed hyperlipidemia: Secondary | ICD-10-CM | POA: Diagnosis not present

## 2015-10-29 DIAGNOSIS — K219 Gastro-esophageal reflux disease without esophagitis: Secondary | ICD-10-CM | POA: Diagnosis not present

## 2015-11-08 DIAGNOSIS — K219 Gastro-esophageal reflux disease without esophagitis: Secondary | ICD-10-CM | POA: Diagnosis not present

## 2015-11-08 DIAGNOSIS — E1129 Type 2 diabetes mellitus with other diabetic kidney complication: Secondary | ICD-10-CM | POA: Diagnosis not present

## 2015-11-08 DIAGNOSIS — E782 Mixed hyperlipidemia: Secondary | ICD-10-CM | POA: Diagnosis not present

## 2015-11-08 DIAGNOSIS — I10 Essential (primary) hypertension: Secondary | ICD-10-CM | POA: Diagnosis not present

## 2015-11-17 ENCOUNTER — Encounter: Payer: Self-pay | Admitting: *Deleted

## 2015-11-26 DIAGNOSIS — E161 Other hypoglycemia: Secondary | ICD-10-CM | POA: Diagnosis not present

## 2015-11-26 DIAGNOSIS — R630 Anorexia: Secondary | ICD-10-CM | POA: Diagnosis not present

## 2015-11-26 DIAGNOSIS — Z6839 Body mass index (BMI) 39.0-39.9, adult: Secondary | ICD-10-CM | POA: Diagnosis not present

## 2015-11-26 DIAGNOSIS — F4489 Other dissociative and conversion disorders: Secondary | ICD-10-CM | POA: Diagnosis not present

## 2015-11-26 DIAGNOSIS — R7309 Other abnormal glucose: Secondary | ICD-10-CM | POA: Diagnosis not present

## 2015-11-26 DIAGNOSIS — E11649 Type 2 diabetes mellitus with hypoglycemia without coma: Secondary | ICD-10-CM | POA: Diagnosis not present

## 2015-11-26 DIAGNOSIS — F419 Anxiety disorder, unspecified: Secondary | ICD-10-CM | POA: Diagnosis not present

## 2015-11-29 ENCOUNTER — Ambulatory Visit: Payer: Commercial Managed Care - HMO | Admitting: Internal Medicine

## 2015-11-30 ENCOUNTER — Encounter: Payer: Self-pay | Admitting: Internal Medicine

## 2015-12-02 DIAGNOSIS — R079 Chest pain, unspecified: Secondary | ICD-10-CM | POA: Diagnosis not present

## 2015-12-02 DIAGNOSIS — M542 Cervicalgia: Secondary | ICD-10-CM | POA: Diagnosis not present

## 2015-12-02 DIAGNOSIS — S161XXA Strain of muscle, fascia and tendon at neck level, initial encounter: Secondary | ICD-10-CM | POA: Diagnosis not present

## 2015-12-02 DIAGNOSIS — S29019A Strain of muscle and tendon of unspecified wall of thorax, initial encounter: Secondary | ICD-10-CM | POA: Diagnosis not present

## 2015-12-02 DIAGNOSIS — R002 Palpitations: Secondary | ICD-10-CM | POA: Diagnosis not present

## 2015-12-03 DIAGNOSIS — S29019A Strain of muscle and tendon of unspecified wall of thorax, initial encounter: Secondary | ICD-10-CM | POA: Diagnosis not present

## 2015-12-03 DIAGNOSIS — R002 Palpitations: Secondary | ICD-10-CM | POA: Diagnosis not present

## 2015-12-03 DIAGNOSIS — S161XXA Strain of muscle, fascia and tendon at neck level, initial encounter: Secondary | ICD-10-CM | POA: Diagnosis not present

## 2015-12-06 DIAGNOSIS — E109 Type 1 diabetes mellitus without complications: Secondary | ICD-10-CM | POA: Diagnosis not present
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
## 2015-12-13 ENCOUNTER — Ambulatory Visit (INDEPENDENT_AMBULATORY_CARE_PROVIDER_SITE_OTHER): Payer: Commercial Managed Care - HMO | Admitting: Internal Medicine

## 2015-12-13 ENCOUNTER — Encounter: Payer: Self-pay | Admitting: Internal Medicine

## 2015-12-13 ENCOUNTER — Observation Stay: Admit: 2015-12-13 | Payer: Self-pay | Admitting: Internal Medicine

## 2015-12-13 ENCOUNTER — Telehealth: Payer: Self-pay | Admitting: *Deleted

## 2015-12-13 VITALS — BP 100/60 | HR 92 | Ht 73.0 in | Wt 302.8 lb

## 2015-12-13 DIAGNOSIS — R42 Dizziness and giddiness: Secondary | ICD-10-CM | POA: Diagnosis not present

## 2015-12-13 DIAGNOSIS — R0602 Shortness of breath: Secondary | ICD-10-CM

## 2015-12-13 LAB — CBC
HCT: 39.9 % (ref 39.0–52.0)
Hemoglobin: 13 g/dL (ref 13.0–17.0)
MCH: 29.5 pg (ref 26.0–34.0)
MCHC: 32.6 g/dL (ref 30.0–36.0)
MCV: 90.7 fL (ref 78.0–100.0)
MPV: 9.4 fL (ref 8.6–12.4)
PLATELETS: 300 10*3/uL (ref 150–400)
RBC: 4.4 MIL/uL (ref 4.22–5.81)
RDW: 14.7 % (ref 11.5–15.5)
WBC: 7.9 10*3/uL (ref 4.0–10.5)

## 2015-12-13 LAB — BASIC METABOLIC PANEL
BUN: 43 mg/dL — ABNORMAL HIGH (ref 7–25)
CALCIUM: 9.9 mg/dL (ref 8.6–10.3)
CO2: 23 mmol/L (ref 20–31)
CREATININE: 2.08 mg/dL — AB (ref 0.70–1.25)
Chloride: 106 mmol/L (ref 98–110)
Glucose, Bld: 75 mg/dL (ref 65–99)
Potassium: 4.8 mmol/L (ref 3.5–5.3)
Sodium: 139 mmol/L (ref 135–146)

## 2015-12-13 LAB — BRAIN NATRIURETIC PEPTIDE

## 2015-12-13 NOTE — Telephone Encounter (Signed)
-----   Message from Dorris Carnes V, MD sent at 12/13/2015  5:00 PM EST ----- CBC normal Fluid looks OK Cr stable at 2.1  I have relayed lab results to pt   With symptoms I would recomm Dobutamine myoview to r/o ischemia   Will need to arrange

## 2015-12-13 NOTE — Patient Instructions (Signed)
Your physician recommends that you continue on your current medications as directed. Please refer to the Current Medication list given to you today. Your physician recommends that you return for lab work in: BMET, BNP, CBC

## 2015-12-14 NOTE — Telephone Encounter (Signed)
Order for dobutamine myoview placed.   Message sent to [facility] to request scheduling.

## 2015-12-16 ENCOUNTER — Telehealth: Payer: Self-pay | Admitting: Internal Medicine

## 2015-12-16 NOTE — Telephone Encounter (Signed)
New Message  Pt requested to speak w/ RN concerning his nuc/stress order- following up on [place] message. Please call back and discuss.

## 2015-12-23 NOTE — Telephone Encounter (Signed)
Called patient. He states he has a stomach virus or food poisoning, he is not sure which.  Has been sick for approx 10 days he thinks. Advised to contact PCP to see if they could prescribe something or want to see him.  Also advised that Dr. Harrington Challenger prefers he have the stress test here, at [facility] office and that he will not be permitted by insurance to stay overnight at the hospital to have it done. Explained the need for the 2 day process.  Pt verbalizes understanding and is agreeable to schedule for [facility] when he is feeling better.  Advised someone will call him early next week to schedule.

## 2015-12-23 NOTE — Telephone Encounter (Signed)
F/u   Pt following up on nuc/stress- overnight. Please call back and discuss.

## 2015-12-30 ENCOUNTER — Encounter (HOSPITAL_COMMUNITY): Payer: Commercial Managed Care - HMO

## 2016-01-05 ENCOUNTER — Ambulatory Visit (HOSPITAL_COMMUNITY): Payer: Commercial Managed Care - HMO

## 2016-01-05 DIAGNOSIS — J440 Chronic obstructive pulmonary disease with acute lower respiratory infection: Secondary | ICD-10-CM | POA: Diagnosis not present

## 2016-01-06 ENCOUNTER — Ambulatory Visit (HOSPITAL_COMMUNITY): Payer: Commercial Managed Care - HMO

## 2016-01-27 DIAGNOSIS — I25118 Atherosclerotic heart disease of native coronary artery with other forms of angina pectoris: Secondary | ICD-10-CM | POA: Diagnosis not present

## 2016-01-27 DIAGNOSIS — E1129 Type 2 diabetes mellitus with other diabetic kidney complication: Secondary | ICD-10-CM | POA: Diagnosis not present

## 2016-01-27 DIAGNOSIS — E782 Mixed hyperlipidemia: Secondary | ICD-10-CM | POA: Diagnosis not present

## 2016-01-27 DIAGNOSIS — F331 Major depressive disorder, recurrent, moderate: Secondary | ICD-10-CM | POA: Diagnosis not present

## 2016-01-27 DIAGNOSIS — N183 Chronic kidney disease, stage 3 (moderate): Secondary | ICD-10-CM | POA: Diagnosis not present

## 2016-01-27 DIAGNOSIS — K219 Gastro-esophageal reflux disease without esophagitis: Secondary | ICD-10-CM | POA: Diagnosis not present

## 2016-01-27 DIAGNOSIS — I1310 Hypertensive heart and chronic kidney disease without heart failure, with stage 1 through stage 4 chronic kidney disease, or unspecified chronic kidney disease: Secondary | ICD-10-CM | POA: Diagnosis not present

## 2016-01-27 DIAGNOSIS — J449 Chronic obstructive pulmonary disease, unspecified: Secondary | ICD-10-CM | POA: Diagnosis not present

## 2016-01-27 DIAGNOSIS — E1140 Type 2 diabetes mellitus with diabetic neuropathy, unspecified: Secondary | ICD-10-CM | POA: Diagnosis not present

## 2016-01-27 DIAGNOSIS — I10 Essential (primary) hypertension: Secondary | ICD-10-CM | POA: Diagnosis not present

## 2016-01-27 DIAGNOSIS — Z4681 Encounter for fitting and adjustment of insulin pump: Secondary | ICD-10-CM | POA: Diagnosis not present

## 2016-02-15 ENCOUNTER — Institutional Professional Consult (permissible substitution): Payer: Commercial Managed Care - HMO | Admitting: Pulmonary Disease

## 2016-03-14 DIAGNOSIS — E109 Type 1 diabetes mellitus without complications: Secondary | ICD-10-CM | POA: Diagnosis not present

## 2016-03-14 DIAGNOSIS — E1140 Type 2 diabetes mellitus with diabetic neuropathy, unspecified: Secondary | ICD-10-CM | POA: Diagnosis not present

## 2016-03-15 ENCOUNTER — Encounter: Payer: Self-pay | Admitting: Pulmonary Disease

## 2016-04-05 DIAGNOSIS — F329 Major depressive disorder, single episode, unspecified: Secondary | ICD-10-CM | POA: Diagnosis not present

## 2016-04-05 DIAGNOSIS — Z794 Long term (current) use of insulin: Secondary | ICD-10-CM | POA: Diagnosis not present

## 2016-04-05 DIAGNOSIS — E785 Hyperlipidemia, unspecified: Secondary | ICD-10-CM | POA: Diagnosis not present

## 2016-04-05 DIAGNOSIS — I2510 Atherosclerotic heart disease of native coronary artery without angina pectoris: Secondary | ICD-10-CM | POA: Diagnosis not present

## 2016-04-05 DIAGNOSIS — N183 Chronic kidney disease, stage 3 (moderate): Secondary | ICD-10-CM | POA: Diagnosis not present

## 2016-04-05 DIAGNOSIS — N2581 Secondary hyperparathyroidism of renal origin: Secondary | ICD-10-CM | POA: Diagnosis not present

## 2016-04-05 DIAGNOSIS — I129 Hypertensive chronic kidney disease with stage 1 through stage 4 chronic kidney disease, or unspecified chronic kidney disease: Secondary | ICD-10-CM | POA: Diagnosis not present

## 2016-04-05 DIAGNOSIS — E669 Obesity, unspecified: Secondary | ICD-10-CM | POA: Diagnosis not present

## 2016-04-05 DIAGNOSIS — E119 Type 2 diabetes mellitus without complications: Secondary | ICD-10-CM | POA: Diagnosis not present

## 2016-04-27 DIAGNOSIS — F331 Major depressive disorder, recurrent, moderate: Secondary | ICD-10-CM | POA: Diagnosis not present

## 2016-04-27 DIAGNOSIS — K219 Gastro-esophageal reflux disease without esophagitis: Secondary | ICD-10-CM | POA: Diagnosis not present

## 2016-04-27 DIAGNOSIS — J449 Chronic obstructive pulmonary disease, unspecified: Secondary | ICD-10-CM | POA: Diagnosis not present

## 2016-04-27 DIAGNOSIS — I25118 Atherosclerotic heart disease of native coronary artery with other forms of angina pectoris: Secondary | ICD-10-CM | POA: Diagnosis not present

## 2016-04-27 DIAGNOSIS — F5101 Primary insomnia: Secondary | ICD-10-CM | POA: Diagnosis not present

## 2016-04-27 DIAGNOSIS — I1310 Hypertensive heart and chronic kidney disease without heart failure, with stage 1 through stage 4 chronic kidney disease, or unspecified chronic kidney disease: Secondary | ICD-10-CM | POA: Diagnosis not present

## 2016-04-27 DIAGNOSIS — E1129 Type 2 diabetes mellitus with other diabetic kidney complication: Secondary | ICD-10-CM | POA: Diagnosis not present

## 2016-04-27 DIAGNOSIS — N183 Chronic kidney disease, stage 3 (moderate): Secondary | ICD-10-CM | POA: Diagnosis not present

## 2016-04-27 DIAGNOSIS — E782 Mixed hyperlipidemia: Secondary | ICD-10-CM | POA: Diagnosis not present

## 2016-04-27 DIAGNOSIS — E1140 Type 2 diabetes mellitus with diabetic neuropathy, unspecified: Secondary | ICD-10-CM | POA: Diagnosis not present

## 2016-04-27 DIAGNOSIS — I10 Essential (primary) hypertension: Secondary | ICD-10-CM | POA: Diagnosis not present

## 2016-04-28 DIAGNOSIS — M79671 Pain in right foot: Secondary | ICD-10-CM | POA: Diagnosis not present

## 2016-04-28 DIAGNOSIS — S99911A Unspecified injury of right ankle, initial encounter: Secondary | ICD-10-CM | POA: Diagnosis not present

## 2016-04-28 DIAGNOSIS — S9031XA Contusion of right foot, initial encounter: Secondary | ICD-10-CM | POA: Diagnosis not present

## 2016-04-28 DIAGNOSIS — M7989 Other specified soft tissue disorders: Secondary | ICD-10-CM | POA: Diagnosis not present

## 2016-05-04 DIAGNOSIS — R259 Unspecified abnormal involuntary movements: Secondary | ICD-10-CM | POA: Diagnosis not present

## 2016-05-04 DIAGNOSIS — S2241XA Multiple fractures of ribs, right side, initial encounter for closed fracture: Secondary | ICD-10-CM | POA: Diagnosis not present

## 2016-05-04 DIAGNOSIS — R0602 Shortness of breath: Secondary | ICD-10-CM | POA: Diagnosis not present

## 2016-05-11 DIAGNOSIS — Z4681 Encounter for fitting and adjustment of insulin pump: Secondary | ICD-10-CM | POA: Diagnosis not present

## 2016-05-11 DIAGNOSIS — S2241XA Multiple fractures of ribs, right side, initial encounter for closed fracture: Secondary | ICD-10-CM | POA: Diagnosis not present

## 2016-05-11 DIAGNOSIS — E1140 Type 2 diabetes mellitus with diabetic neuropathy, unspecified: Secondary | ICD-10-CM | POA: Diagnosis not present

## 2016-06-21 DIAGNOSIS — E109 Type 1 diabetes mellitus without complications: Secondary | ICD-10-CM | POA: Diagnosis not present

## 2016-06-21 DIAGNOSIS — E1140 Type 2 diabetes mellitus with diabetic neuropathy, unspecified: Secondary | ICD-10-CM | POA: Diagnosis not present

## 2016-07-31 DIAGNOSIS — N183 Chronic kidney disease, stage 3 (moderate): Secondary | ICD-10-CM | POA: Diagnosis not present

## 2016-07-31 DIAGNOSIS — E1159 Type 2 diabetes mellitus with other circulatory complications: Secondary | ICD-10-CM | POA: Diagnosis not present

## 2016-07-31 DIAGNOSIS — R1011 Right upper quadrant pain: Secondary | ICD-10-CM | POA: Diagnosis not present

## 2016-07-31 DIAGNOSIS — I1310 Hypertensive heart and chronic kidney disease without heart failure, with stage 1 through stage 4 chronic kidney disease, or unspecified chronic kidney disease: Secondary | ICD-10-CM | POA: Diagnosis not present

## 2016-07-31 DIAGNOSIS — K219 Gastro-esophageal reflux disease without esophagitis: Secondary | ICD-10-CM | POA: Diagnosis not present

## 2016-07-31 DIAGNOSIS — F332 Major depressive disorder, recurrent severe without psychotic features: Secondary | ICD-10-CM | POA: Diagnosis not present

## 2016-07-31 DIAGNOSIS — F331 Major depressive disorder, recurrent, moderate: Secondary | ICD-10-CM | POA: Diagnosis not present

## 2016-07-31 DIAGNOSIS — I25118 Atherosclerotic heart disease of native coronary artery with other forms of angina pectoris: Secondary | ICD-10-CM | POA: Diagnosis not present

## 2016-07-31 DIAGNOSIS — E782 Mixed hyperlipidemia: Secondary | ICD-10-CM | POA: Diagnosis not present

## 2016-08-28 DIAGNOSIS — R1011 Right upper quadrant pain: Secondary | ICD-10-CM | POA: Diagnosis not present

## 2016-08-28 DIAGNOSIS — K8080 Other cholelithiasis without obstruction: Secondary | ICD-10-CM | POA: Diagnosis not present

## 2016-09-11 ENCOUNTER — Other Ambulatory Visit: Payer: Self-pay | Admitting: Internal Medicine

## 2016-09-12 DIAGNOSIS — J441 Chronic obstructive pulmonary disease with (acute) exacerbation: Secondary | ICD-10-CM | POA: Diagnosis not present

## 2016-09-12 DIAGNOSIS — J449 Chronic obstructive pulmonary disease, unspecified: Secondary | ICD-10-CM | POA: Diagnosis not present

## 2016-09-12 DIAGNOSIS — J440 Chronic obstructive pulmonary disease with acute lower respiratory infection: Secondary | ICD-10-CM | POA: Diagnosis not present

## 2016-10-17 DIAGNOSIS — E1159 Type 2 diabetes mellitus with other circulatory complications: Secondary | ICD-10-CM | POA: Diagnosis not present

## 2016-11-08 DIAGNOSIS — E161 Other hypoglycemia: Secondary | ICD-10-CM | POA: Diagnosis not present

## 2016-11-08 DIAGNOSIS — R0902 Hypoxemia: Secondary | ICD-10-CM | POA: Diagnosis not present

## 2016-11-20 DIAGNOSIS — I25118 Atherosclerotic heart disease of native coronary artery with other forms of angina pectoris: Secondary | ICD-10-CM | POA: Diagnosis not present

## 2016-11-20 DIAGNOSIS — J449 Chronic obstructive pulmonary disease, unspecified: Secondary | ICD-10-CM | POA: Diagnosis not present

## 2016-11-20 DIAGNOSIS — E782 Mixed hyperlipidemia: Secondary | ICD-10-CM | POA: Diagnosis not present

## 2016-11-20 DIAGNOSIS — K219 Gastro-esophageal reflux disease without esophagitis: Secondary | ICD-10-CM | POA: Diagnosis not present

## 2016-11-20 DIAGNOSIS — E1140 Type 2 diabetes mellitus with diabetic neuropathy, unspecified: Secondary | ICD-10-CM | POA: Diagnosis not present

## 2016-11-20 DIAGNOSIS — Z4681 Encounter for fitting and adjustment of insulin pump: Secondary | ICD-10-CM | POA: Diagnosis not present

## 2016-11-20 DIAGNOSIS — F332 Major depressive disorder, recurrent severe without psychotic features: Secondary | ICD-10-CM | POA: Diagnosis not present

## 2016-11-20 DIAGNOSIS — I1310 Hypertensive heart and chronic kidney disease without heart failure, with stage 1 through stage 4 chronic kidney disease, or unspecified chronic kidney disease: Secondary | ICD-10-CM | POA: Diagnosis not present

## 2016-11-20 DIAGNOSIS — N183 Chronic kidney disease, stage 3 (moderate): Secondary | ICD-10-CM | POA: Diagnosis not present

## 2017-01-08 ENCOUNTER — Other Ambulatory Visit: Payer: Self-pay | Admitting: Internal Medicine

## 2017-01-08 MED ORDER — ISOSORBIDE MONONITRATE ER 30 MG PO TB24: 30.0000 mg | ORAL_TABLET | Freq: Every day | ORAL | 0 refills | Status: DC

## 2017-01-17 DIAGNOSIS — E1159 Type 2 diabetes mellitus with other circulatory complications: Secondary | ICD-10-CM | POA: Diagnosis not present

## 2017-02-22 ENCOUNTER — Other Ambulatory Visit: Payer: Self-pay | Admitting: *Deleted

## 2017-02-22 MED ORDER — ISOSORBIDE MONONITRATE ER 30 MG PO TB24: 30.0000 mg | ORAL_TABLET | Freq: Every day | ORAL | 0 refills | Status: DC

## 2017-02-27 ENCOUNTER — Encounter: Payer: Self-pay | Admitting: Internal Medicine

## 2017-03-12 DIAGNOSIS — N183 Chronic kidney disease, stage 3 (moderate): Secondary | ICD-10-CM | POA: Diagnosis not present

## 2017-03-12 DIAGNOSIS — K219 Gastro-esophageal reflux disease without esophagitis: Secondary | ICD-10-CM | POA: Diagnosis not present

## 2017-03-12 DIAGNOSIS — E1129 Type 2 diabetes mellitus with other diabetic kidney complication: Secondary | ICD-10-CM | POA: Diagnosis not present

## 2017-03-12 DIAGNOSIS — Z4681 Encounter for fitting and adjustment of insulin pump: Secondary | ICD-10-CM | POA: Diagnosis not present

## 2017-03-12 DIAGNOSIS — J449 Chronic obstructive pulmonary disease, unspecified: Secondary | ICD-10-CM | POA: Diagnosis not present

## 2017-03-12 DIAGNOSIS — I25118 Atherosclerotic heart disease of native coronary artery with other forms of angina pectoris: Secondary | ICD-10-CM | POA: Diagnosis not present

## 2017-03-12 DIAGNOSIS — D529 Folate deficiency anemia, unspecified: Secondary | ICD-10-CM | POA: Diagnosis not present

## 2017-03-12 DIAGNOSIS — I1310 Hypertensive heart and chronic kidney disease without heart failure, with stage 1 through stage 4 chronic kidney disease, or unspecified chronic kidney disease: Secondary | ICD-10-CM | POA: Diagnosis not present

## 2017-03-12 DIAGNOSIS — F332 Major depressive disorder, recurrent severe without psychotic features: Secondary | ICD-10-CM | POA: Diagnosis not present

## 2017-03-12 DIAGNOSIS — E782 Mixed hyperlipidemia: Secondary | ICD-10-CM | POA: Diagnosis not present

## 2017-03-12 DIAGNOSIS — D631 Anemia in chronic kidney disease: Secondary | ICD-10-CM | POA: Diagnosis not present

## 2017-03-16 ENCOUNTER — Ambulatory Visit: Payer: Commercial Managed Care - HMO | Admitting: Internal Medicine

## 2017-03-22 ENCOUNTER — Other Ambulatory Visit: Payer: Self-pay | Admitting: Internal Medicine

## 2017-04-30 ENCOUNTER — Ambulatory Visit: Payer: Commercial Managed Care - HMO | Admitting: Internal Medicine

## 2017-05-15 DIAGNOSIS — E1159 Type 2 diabetes mellitus with other circulatory complications: Secondary | ICD-10-CM | POA: Diagnosis not present

## 2017-05-23 ENCOUNTER — Other Ambulatory Visit: Payer: Self-pay | Admitting: Internal Medicine

## 2017-07-09 DIAGNOSIS — I130 Hypertensive heart and chronic kidney disease with heart failure and stage 1 through stage 4 chronic kidney disease, or unspecified chronic kidney disease: Secondary | ICD-10-CM | POA: Diagnosis not present

## 2017-07-09 DIAGNOSIS — I25118 Atherosclerotic heart disease of native coronary artery with other forms of angina pectoris: Secondary | ICD-10-CM | POA: Diagnosis not present

## 2017-07-09 DIAGNOSIS — Z794 Long term (current) use of insulin: Secondary | ICD-10-CM | POA: Diagnosis not present

## 2017-07-09 DIAGNOSIS — F332 Major depressive disorder, recurrent severe without psychotic features: Secondary | ICD-10-CM | POA: Diagnosis not present

## 2017-07-09 DIAGNOSIS — E1129 Type 2 diabetes mellitus with other diabetic kidney complication: Secondary | ICD-10-CM | POA: Diagnosis not present

## 2017-07-09 DIAGNOSIS — E782 Mixed hyperlipidemia: Secondary | ICD-10-CM | POA: Diagnosis not present

## 2017-07-09 DIAGNOSIS — J449 Chronic obstructive pulmonary disease, unspecified: Secondary | ICD-10-CM | POA: Diagnosis not present

## 2017-07-09 DIAGNOSIS — K219 Gastro-esophageal reflux disease without esophagitis: Secondary | ICD-10-CM | POA: Diagnosis not present

## 2017-07-09 DIAGNOSIS — N183 Chronic kidney disease, stage 3 (moderate): Secondary | ICD-10-CM | POA: Diagnosis not present

## 2017-07-20 ENCOUNTER — Ambulatory Visit: Payer: Commercial Managed Care - HMO | Admitting: Internal Medicine

## 2017-08-15 DIAGNOSIS — N183 Chronic kidney disease, stage 3 (moderate): Secondary | ICD-10-CM | POA: Diagnosis not present

## 2017-08-15 DIAGNOSIS — I2510 Atherosclerotic heart disease of native coronary artery without angina pectoris: Secondary | ICD-10-CM | POA: Diagnosis not present

## 2017-08-15 DIAGNOSIS — F329 Major depressive disorder, single episode, unspecified: Secondary | ICD-10-CM | POA: Diagnosis not present

## 2017-08-15 DIAGNOSIS — Z794 Long term (current) use of insulin: Secondary | ICD-10-CM | POA: Diagnosis not present

## 2017-08-15 DIAGNOSIS — I129 Hypertensive chronic kidney disease with stage 1 through stage 4 chronic kidney disease, or unspecified chronic kidney disease: Secondary | ICD-10-CM | POA: Diagnosis not present

## 2017-08-15 DIAGNOSIS — E785 Hyperlipidemia, unspecified: Secondary | ICD-10-CM | POA: Diagnosis not present

## 2017-08-15 DIAGNOSIS — N2581 Secondary hyperparathyroidism of renal origin: Secondary | ICD-10-CM | POA: Diagnosis not present

## 2017-08-15 DIAGNOSIS — E1159 Type 2 diabetes mellitus with other circulatory complications: Secondary | ICD-10-CM | POA: Diagnosis not present

## 2017-08-15 DIAGNOSIS — E119 Type 2 diabetes mellitus without complications: Secondary | ICD-10-CM | POA: Diagnosis not present

## 2017-09-07 ENCOUNTER — Encounter: Payer: Self-pay | Admitting: Internal Medicine

## 2017-09-07 ENCOUNTER — Ambulatory Visit (INDEPENDENT_AMBULATORY_CARE_PROVIDER_SITE_OTHER): Payer: Medicare HMO | Admitting: Internal Medicine

## 2017-09-07 VITALS — BP 110/56 | HR 96 | Ht 74.0 in | Wt 305.0 lb

## 2017-09-07 DIAGNOSIS — E782 Mixed hyperlipidemia: Secondary | ICD-10-CM

## 2017-09-07 DIAGNOSIS — R42 Dizziness and giddiness: Secondary | ICD-10-CM

## 2017-09-07 DIAGNOSIS — I2510 Atherosclerotic heart disease of native coronary artery without angina pectoris: Secondary | ICD-10-CM | POA: Diagnosis not present

## 2017-09-07 DIAGNOSIS — R0600 Dyspnea, unspecified: Secondary | ICD-10-CM

## 2017-09-07 MED ORDER — AMLODIPINE BESYLATE 5 MG PO TABS: 5.0000 mg | ORAL_TABLET | Freq: Every day | ORAL | 3 refills | Status: DC

## 2017-09-07 MED ORDER — ROSUVASTATIN CALCIUM 20 MG PO TABS: 20.0000 mg | ORAL_TABLET | Freq: Every day | ORAL | 3 refills | Status: DC

## 2017-09-07 NOTE — Patient Instructions (Signed)
Your physician has recommended you make the following change in your medication:  1.) stop hydralazine 2.) stop pravastatin 3.) decrease amlodipine to 5 mg daily 4.) Start Crestor 20 mg daily (for cholesterol)  Your physician recommends that you schedule a follow-up appointment in: 4 weeks with Dr. Harrington Challenger or APP.
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
## 2017-09-12 ENCOUNTER — Other Ambulatory Visit: Payer: Self-pay | Admitting: *Deleted

## 2017-09-12 NOTE — Patient Outreach (Addendum)
[facility]) Care Management  09/12/2017  Reginald Duncan 10-Jun-1951 [id number]  Telephone Screen  Referral Date: 09/12/17 Referral Source: [facility] Referral Reason:  Insurance: [employer] Medicare   Outreach attempt #1 to patient. No answer. RN CM left HIPAA compliant message along with contact info.    Plan: RN CM will contact patient within 2 weeks.   [name], RN, BSN, MHA/MSL, [place] Telephonic Care Manager Coordinator [facility] Direct Phone: [phone] Toll Free: [phone] Fax: [phone]

## 2017-09-17 ENCOUNTER — Ambulatory Visit: Payer: Self-pay | Admitting: *Deleted

## 2017-09-19 ENCOUNTER — Ambulatory Visit: Payer: Self-pay | Admitting: *Deleted

## 2017-09-19 ENCOUNTER — Other Ambulatory Visit: Payer: Self-pay | Admitting: *Deleted

## 2017-09-19 NOTE — Patient Outreach (Signed)
[facility]) Care Management  09/19/2017  Reginald Duncan 05-30-1951 [id number]  Telephone Screen  Referral Date: 09/12/17 Referral Source: Reginald Duncan) Referral Reason: Needs help with getting a pump. He was approved but can't afford it. Insurance: [employer] Medicare  Patient has past medical history of CVA, CHF, COPD, DM, CKD 4, MI, CAD, PAD, GERD, DJD, HTN, Dementia, Depression, Sleep Apnea, Hernia, Neuropathy Lower Extremities, Chronic Back Pain, and Anemia. Patient reported, he has been using an insulin pump since 2012. The insulin pump stopped working and he is having to self-administer his insulin. Patient stated, he contacted [employer] about getting another pump. He was told the insulin pump would cost him $8000 out of pocket. The insurance company doesn't help to cover the cost, per patient. Patient stated, his BG has increased due to self-administering his insulin. Patient unable to remember his last HGB A1C due to short term memory loss. Patient reported, he doesn't have a scale at home to monitor his weight. He is not sure if he wants to monitor his weight daily. He believes monitoring his weight could lead to more depression related to more weight gain or no changes with his weight. He explained his shortness of breath may be related to his weight. Patient verbalized he doesn't sleep, which affects his memory. He stated, he is not eating the proper foods due to his limited income. He's verbalized having several hypoglycemic episodes and having to go to the emergency room last year. Patient voiced having depression (score 16). Patient has a history of chronic pain and he is asking about a shower chair. He stated, his back pain is starting to increase. Patient's next primary MD appointment is scheduled on 10/25/17. He has an upcoming appointment with his Ophthalmologist on 10/22/17 and Cardiologist on 10/11/17. His last Cardiologist visit was 09/07/17. Per EMR and  patient, several changes were made to his medications. [facility] services and benefits explained to patient and he agreed to services.    Plan:  RN CM will send referral to [facility] RN for further in home eval/assessment of care needs and management of chronic conditions. RN CM advised patient to contact RNCM for any needs or concerns. RN CM will send [facility] SW referral for possible assistance with community resources related DME, depression, and assistance with obtaining an insulin pump.  RN CM will send [facility] pharmacy referral for polypharmacy.   [name], RN, BSN, MHA/MSL, [place] Telephonic Care Manager Coordinator [facility] Direct Phone: [phone] Toll Free: [phone] Fax: [phone]

## 2017-09-21 ENCOUNTER — Other Ambulatory Visit: Payer: Self-pay

## 2017-09-21 NOTE — Patient Outreach (Signed)
Care Coordination: New referral:  Placed call to patient to inquire about making a home visit appointment to assess needs. Patient agreed to home visit on 09/25/2017   PLAN: Home visit to assess needs on 09/25/2017. Confirmed address and provided my contact number for patient.  Tomasa Rand, RN, BSN, CEN [facility] [employer] [phone]

## 2017-09-25 ENCOUNTER — Ambulatory Visit: Payer: Self-pay

## 2017-09-26 ENCOUNTER — Other Ambulatory Visit: Payer: Self-pay | Admitting: Pharmacist

## 2017-09-26 DIAGNOSIS — J441 Chronic obstructive pulmonary disease with (acute) exacerbation: Secondary | ICD-10-CM | POA: Diagnosis not present

## 2017-09-26 NOTE — Patient Outreach (Signed)
[facility]) Care Management  09/26/2017  JAHDEN SCHARA 1951/06/16 [id number]  Patient was referred to [place] Pharmacist via direct referral from PCP for review of financial assistance options for an insulin pump.   Unsuccessful phone outreach to patient, HIPAA compliant message left requesting return call.   Plan:  Will make second outreach attempt next week if no return call.   Karrie Meres, PharmD, [place] [phone]

## 2017-09-27 ENCOUNTER — Ambulatory Visit: Payer: Self-pay

## 2017-09-27 ENCOUNTER — Other Ambulatory Visit: Payer: Self-pay

## 2017-09-27 NOTE — Patient Outreach (Signed)
Care coordination: Message received from patient reporting that he has the flu and is cancelling today's home visit.  Patient would like a call next week to reschedule.  PLAN: Will contact patient to reschedule home visit.  Tomasa Rand, RN, BSN, CEN [facility] [employer] [phone]

## 2017-09-28 ENCOUNTER — Other Ambulatory Visit: Payer: Self-pay | Admitting: *Deleted

## 2017-09-28 NOTE — Patient Outreach (Signed)
[facility]) Care Management  09/28/2017  LEVAN ALOIA August 18, 1951 [id number]   CSW received referral for community resource linking, support and depression. CSW  made an initial attempt to try and contact patient today to perform phone assessment, as well as assess and assist with social needs and services, without success.  A HIPAA compliant message was left for patient on voicemail. CSW will try again in 2-3 days if no return call is received.   Eduard Clos, MSW, [place] Worker  [place] [phone]

## 2017-10-01 ENCOUNTER — Other Ambulatory Visit: Payer: Self-pay | Admitting: *Deleted

## 2017-10-01 ENCOUNTER — Other Ambulatory Visit: Payer: Self-pay | Admitting: Pharmacist

## 2017-10-01 ENCOUNTER — Ambulatory Visit: Payer: Self-pay | Admitting: *Deleted

## 2017-10-01 NOTE — Patient Outreach (Signed)
[facility]) Care Management  10/01/2017  Reginald Duncan 05/12/1951 [id number]  Second unsuccessful phone outreach to patient.  HIPAA compliant message left requesting return call.   Plan:  Third phone outreach attempt in the next week if no return call.   Karrie Meres, PharmD, [place] [phone]

## 2017-10-01 NOTE — Patient Outreach (Signed)
[place] [facility]) Care Management  10/01/2017  Reginald Duncan 10-Nov-1950 [id number]  CSW made a 2nd phone outreach attempt to try and contact patient today to perform phone assessment, as well as assess and assist with social needs and services, without success.  CSW will try again in 2-3 days if no return call is received.   Eduard Clos, MSW, [place] Worker  [place] [phone]

## 2017-10-02 ENCOUNTER — Ambulatory Visit: Payer: Self-pay | Admitting: *Deleted

## 2017-10-02 ENCOUNTER — Other Ambulatory Visit: Payer: Self-pay

## 2017-10-02 NOTE — Patient Outreach (Signed)
Care Coordination: Placed call to patient to reschedule previously cancelled home visit. No answer. Left a message requesting a call back.  Tomasa Rand, RN, BSN, CEN [facility] [employer] [phone]

## 2017-10-03 ENCOUNTER — Ambulatory Visit: Payer: Self-pay | Admitting: *Deleted

## 2017-10-03 ENCOUNTER — Other Ambulatory Visit: Payer: Self-pay | Admitting: Pharmacist

## 2017-10-03 ENCOUNTER — Other Ambulatory Visit: Payer: Self-pay | Admitting: *Deleted

## 2017-10-03 ENCOUNTER — Encounter: Payer: Self-pay | Admitting: *Deleted

## 2017-10-03 NOTE — Patient Outreach (Signed)
[facility]) Care Management  10/03/2017  Reginald Duncan August 07, 1951 [id number]  Third unsuccessful phone outreach to patient.  HIPAA compliant message left requesting return call.   Plan:  Will send patient outreach letter.  If no response from patient within 10 business days, will close case.   Karrie Meres, PharmD, [place] [phone]

## 2017-10-03 NOTE — Patient Outreach (Signed)
[place] [facility]) Care Management  10/03/2017  Reginald Duncan 11-03-1950 [id number]  CSW made a 3rd outreach attempt to contact patient today to perform phone assessment, as well as assess and assist with social needs and services, without success.  A HIPAA compliant message was left for patient on voicemail. CSW will send letter as outreach and if no return call in 10 days, plan to close consult.   Eduard Clos, MSW, [employer] Worker  [place] [phone]

## 2017-10-05 ENCOUNTER — Encounter: Payer: Self-pay | Admitting: *Deleted

## 2017-10-08 ENCOUNTER — Other Ambulatory Visit: Payer: Self-pay | Admitting: Pharmacist

## 2017-10-08 ENCOUNTER — Other Ambulatory Visit: Payer: Self-pay

## 2017-10-08 NOTE — Patient Outreach (Signed)
[place] [facility]) Care Management  10/08/2017  DARVELL MONTEFORTE 08/07/1951 [id number]  Received call from [place] that patient answered her call today.  Patient was referred to [place] Pharmacist for review of assistance options with insulin pump.    [employer], his insurer, will not disclose specific member benefit information without member consent.  [facility] Pharmacist has made three outreach calls with no return call and mailed a letter to patient.    Made another outreach call today, left a HIPAA compliant message requesting return call.   Also asked [facility] RN have patient contact [place] so his insurer can be contacted.    10/08/17---1518:  Incoming call from patient, HIPAA details verified.  Explained purpose of call to patient---he is concerned with cost of insulin pump.  Patient reports [employer] told him his portion of insulin pump is >$8,000.  Discussed with patient Medicare typically covers durable medical equipment at 20% co-insurance to patient.  Discussed with patient would need to contact his insurer directly with patient on the phone, to verify his specific insulin pump coverage, if they have a preferred brand or preferred supplier for him to obtain insulin pump from.     Patient wonders why coverage would have changed from previous years---discussed durable medical equipment coverage co-insurance is set by Medicare.    Patient states he is not able to make call to [facility] at time of this call as he was at a restaurant and he requested a call back later this week.     Plan:  Call for later this week scheduled with patient.   Karrie Meres, PharmD, [place] [phone]

## 2017-10-08 NOTE — Patient Outreach (Signed)
Telephone assessment:  Placed call to patient in attempt to schedule home visit.  Patient reports that he does not understand the [facility] program and all he needs help with his is insulin pump that is not working and a shower chair and cane.  Reports he is not sure that he needs a nurse.  Spent a long time talking with patient about his needs and he is very unsure. Reports price quoted for insulin pump was 8000. He reports that he does not have that kind of money. Reports that he is currently using insulin injections and CBG range is 150-175.  Reports that he self monitors 4 times per day.      Patient did agree to home visit for 10/09/2017.  I asked if patient who allow nurse, pharmacist and social worker to come all at one time and he agreed.   Placed call to [facility] pharmacist and spoke with Karrie Meres.  Mr. Jenna Luo states that he has information for patient and patient needs to call him. I placed another call to patient and requested to call [facility] pharmacist. I provided phone number and patient agreed to call. I requested patient call me back to discuss need for home visit as planned.  Initial referral went to pharmacy only.   PLAN: will await to hear back from patient about planned home visit for tomorrow.  Tomasa Rand, RN, BSN, CEN [facility] [employer] [phone]

## 2017-10-09 ENCOUNTER — Ambulatory Visit: Payer: Self-pay

## 2017-10-09 ENCOUNTER — Other Ambulatory Visit: Payer: Self-pay | Admitting: *Deleted

## 2017-10-09 ENCOUNTER — Other Ambulatory Visit: Payer: Self-pay | Admitting: Pharmacist

## 2017-10-09 ENCOUNTER — Other Ambulatory Visit: Payer: Self-pay

## 2017-10-09 NOTE — Patient Outreach (Signed)
[place] [facility]) Care Management  10/09/2017  Reginald Duncan [birth year] [id number]   CSW has been unable to reach patient by phone after 3 or more phone outreach attempts and letter sent by mail.    CSW was advised by Carlos Levering, RN, [facility] RNCM, that patient has declined CSW needs at this time, CSW will close referral at this time   Eduard Clos, MSW, [place] Worker  [place] [phone]

## 2017-10-09 NOTE — Patient Outreach (Signed)
Case closure for nursing: Initial home visit for today cancelled by patient who left me a message the he was not interested in an other services except help with his insulin pump.  Reports that he will call me in the future for any concerns.  PLAN: will cancel today's appointment.  Message sent to Eduard Clos [facility] social worker) and Karrie Meres [facility] pharmacist).  Will send a letter to MD.  Tomasa Rand, RN, BSN, CEN [place] Coordinator [phone]

## 2017-10-09 NOTE — Patient Outreach (Signed)
[facility]) Care Management  10/09/2017  Reginald Duncan 02-25-1951 [id number]  Received a missed call from patient---returned call, HIPAA compliant message left.    Patient returned call, HIPAA details verified.  Patient again is expressing concern with cost of insulin pump.  Reports [employer] told him cost >$8,000.  Discussed with patient will need to contact [employer], his insurance to verify insurance coverage of insulin pump.   Offered to complete conference call with patient---he declined.     He stated his first insulin pump was covered in previous years.  Discussed with patient [facility] Pharmacist unsure how his insurance worked in previous years and again offered to [employer] with patient today to help investigate insurance coverage of insulin pump currently.    He declines and prefers to discuss with next scheduled call.   Plan:  Outreach patient as currently scheduled.   Karrie Meres, PharmD, [place] [phone]

## 2017-10-11 ENCOUNTER — Ambulatory Visit (INDEPENDENT_AMBULATORY_CARE_PROVIDER_SITE_OTHER): Payer: Medicare HMO | Admitting: Physician Assistant

## 2017-10-11 ENCOUNTER — Encounter: Payer: Self-pay | Admitting: Physician Assistant

## 2017-10-11 VITALS — BP 98/76 | HR 97 | Resp 16 | Ht 74.0 in | Wt 316.0 lb

## 2017-10-11 DIAGNOSIS — R42 Dizziness and giddiness: Secondary | ICD-10-CM | POA: Diagnosis not present

## 2017-10-11 DIAGNOSIS — I10 Essential (primary) hypertension: Secondary | ICD-10-CM

## 2017-10-11 DIAGNOSIS — I2510 Atherosclerotic heart disease of native coronary artery without angina pectoris: Secondary | ICD-10-CM

## 2017-10-11 DIAGNOSIS — E782 Mixed hyperlipidemia: Secondary | ICD-10-CM | POA: Diagnosis not present

## 2017-10-11 MED ORDER — AMLODIPINE BESYLATE 2.5 MG PO TABS: 2.5000 mg | ORAL_TABLET | Freq: Every day | ORAL | 3 refills | Status: DC

## 2017-10-11 NOTE — Patient Instructions (Signed)
Medication Instructions:  Your physician has recommended you make the following change in your medication:  1.  DECREASE the Amlodipine to 2.5 mg daily   Labwork: None ordered  Testing/Procedures: None ordered  Follow-Up: Your physician recommends that you schedule a follow-up appointment in: [place] DR. Ozzy.H[place] ]  Any Other Special Instructions Will Be Listed Below (If Applicable).     If you need a refill on your cardiac medications before your next appointment, please call your pharmacy.
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
## 2017-10-12 ENCOUNTER — Ambulatory Visit: Payer: Self-pay | Admitting: *Deleted

## 2017-10-12 ENCOUNTER — Other Ambulatory Visit: Payer: Self-pay | Admitting: Pharmacist

## 2017-10-12 NOTE — Patient Outreach (Signed)
[place] [facility]) Care Management  10/12/2017  Reginald Duncan Mar 17, 1951 [id number]  Appointment scheduled to call patient today at 1400 earlier in the week per patient request.  Outreach made, no answer, HIPAA compliant message left requesting return call.   Earlier in week patient had contacted [facility] Pharmacist regarding his concerns with insulin pump coverage---he did not wish to contact his insurance at that time.   Patient was made aware he will need to be on phone to complete conference call to his insurance company to [employer] benefits.    Plan:   Will make an outreach attempt next week to patient.   Karrie Meres, PharmD, [place] [phone]

## 2017-10-17 ENCOUNTER — Other Ambulatory Visit: Payer: Self-pay | Admitting: Pharmacist

## 2017-10-17 NOTE — Patient Outreach (Signed)
[place] [facility]) Care Management  10/17/2017  Reginald Duncan 1951/05/18 [id number]  Second outreach call to patient, HIPAA details verified.  Patient reports he feels like he is getting the flu again.    Patient was referred to [place] Pharmacist to assist with investigation into insulin pump coverage.  Patient's insurance needs to be called with patient on the phone---was scheduled to do this with patient on 10/12/17 but patient did not answer [facility] Pharmacist phone call that day.   Given patient feeling ill today, offered for patient to either call [facility] Pharmacist back when he is feeling better, or for [facility] Pharmacist to make outreach attempt next week---he prefers [facility] Pharmacist call him next week.    Unable to complete assessments today with patient given he reports he is sick.    Discussed with patient to be sure to follow-up with his PCP if needed given his reported illness today.   Plan:  Call scheduled next week with patient per his request.    Karrie Meres, PharmD, [place] [phone]

## 2017-10-24 ENCOUNTER — Other Ambulatory Visit: Payer: Self-pay | Admitting: Pharmacist

## 2017-10-24 NOTE — Patient Outreach (Signed)
[facility]) Care Management  10/24/2017  Reginald Duncan 10/19/1951 [id number]  Unsuccessful phone outreach to patient at scheduled time with patient.  HIPAA compliant message left requesting return call.  Multiple phone outreach attempts have been made to patient to attempt to call [employer], his insurance, to investigate coverage benefits for insulin pump.    Plan:  Will make a final phone outreach attempt to patient in the next week.    Karrie Meres, PharmD, [place] [phone]

## 2017-10-25 ENCOUNTER — Other Ambulatory Visit: Payer: Self-pay | Admitting: Pharmacist

## 2017-10-25 DIAGNOSIS — I130 Hypertensive heart and chronic kidney disease with heart failure and stage 1 through stage 4 chronic kidney disease, or unspecified chronic kidney disease: Secondary | ICD-10-CM | POA: Diagnosis not present

## 2017-10-25 DIAGNOSIS — Z794 Long term (current) use of insulin: Secondary | ICD-10-CM | POA: Diagnosis not present

## 2017-10-25 DIAGNOSIS — Z4681 Encounter for fitting and adjustment of insulin pump: Secondary | ICD-10-CM | POA: Diagnosis not present

## 2017-10-25 DIAGNOSIS — F332 Major depressive disorder, recurrent severe without psychotic features: Secondary | ICD-10-CM | POA: Diagnosis not present

## 2017-10-25 DIAGNOSIS — E1129 Type 2 diabetes mellitus with other diabetic kidney complication: Secondary | ICD-10-CM | POA: Diagnosis not present

## 2017-10-25 DIAGNOSIS — N183 Chronic kidney disease, stage 3 (moderate): Secondary | ICD-10-CM | POA: Diagnosis not present

## 2017-10-25 DIAGNOSIS — K219 Gastro-esophageal reflux disease without esophagitis: Secondary | ICD-10-CM | POA: Diagnosis not present

## 2017-10-25 DIAGNOSIS — E782 Mixed hyperlipidemia: Secondary | ICD-10-CM | POA: Diagnosis not present

## 2017-10-25 DIAGNOSIS — I25118 Atherosclerotic heart disease of native coronary artery with other forms of angina pectoris: Secondary | ICD-10-CM | POA: Diagnosis not present

## 2017-10-25 NOTE — Patient Outreach (Addendum)
[facility]) Care Management  10/25/2017  [name] [birth year] [id number]   Unsuccessful phone outreach to patient.  HIPAA compliant message left requesting return call.   Multiple phone outreaches have been made, two phone call appointments were scheduled with patient to contact his insurance and investigate insulin pump benefits---he did not answer either scheduled call.  Plan:  Will send outreach letter to patient.   Final phone outreach attempt to patient in 10 business days.   Karrie Meres, PharmD, [place] [phone]  Addendum:  10/25/17 1411:  Patient returned call today to [facility] Pharmacist, HIPAA details verified.  He reports he is going to MD office and he doesn't remember why [facility] Pharmacist called him.  Discussed with patient a call was scheduled with him to contact his insurance to investigate his insurance benefits for insulin pump.  He reports he will call back later.    Discussed it was second time an appointment was scheduled for a specific time and he did not answer call.   Plan:  Given patient called back, will not mail outreach letter.    Patient has [facility] Pharmacist phone number.  Will make one final outreach attempt as scheduled, if he does not call back or answer next call, will close his pharmacy episode.   Karrie Meres, PharmD, [place] [phone]

## 2017-11-08 ENCOUNTER — Other Ambulatory Visit: Payer: Self-pay | Admitting: Pharmacist

## 2017-11-08 NOTE — Patient Outreach (Signed)
[place] [facility]) Care Management  11/08/2017  REXFORD PREVO August 22, 1951 [id number]  Unsuccessful phone outreach to patient.  HIPAA compliant message left requesting return call.  Multiple attempts have been made to reach patient.  Two phone call appointments were scheduled with patient and he did not answer those calls.    Plan:  Will close pharmacy case given unable to maintain contact with patient.    Case closure letter to PCP.    Karrie Meres, PharmD, [place] [phone]

## 2018-01-02 DIAGNOSIS — E1159 Type 2 diabetes mellitus with other circulatory complications: Secondary | ICD-10-CM | POA: Diagnosis not present

## 2018-01-14 ENCOUNTER — Ambulatory Visit: Payer: Medicare HMO | Admitting: Internal Medicine

## 2018-01-24 DIAGNOSIS — E1129 Type 2 diabetes mellitus with other diabetic kidney complication: Secondary | ICD-10-CM | POA: Diagnosis not present

## 2018-01-24 DIAGNOSIS — Z4681 Encounter for fitting and adjustment of insulin pump: Secondary | ICD-10-CM | POA: Diagnosis not present

## 2018-01-24 DIAGNOSIS — I130 Hypertensive heart and chronic kidney disease with heart failure and stage 1 through stage 4 chronic kidney disease, or unspecified chronic kidney disease: Secondary | ICD-10-CM | POA: Diagnosis not present

## 2018-01-24 DIAGNOSIS — I25118 Atherosclerotic heart disease of native coronary artery with other forms of angina pectoris: Secondary | ICD-10-CM | POA: Diagnosis not present

## 2018-01-24 DIAGNOSIS — N183 Chronic kidney disease, stage 3 (moderate): Secondary | ICD-10-CM | POA: Diagnosis not present

## 2018-01-24 DIAGNOSIS — K219 Gastro-esophageal reflux disease without esophagitis: Secondary | ICD-10-CM | POA: Diagnosis not present

## 2018-01-24 DIAGNOSIS — F332 Major depressive disorder, recurrent severe without psychotic features: Secondary | ICD-10-CM | POA: Diagnosis not present

## 2018-01-24 DIAGNOSIS — E782 Mixed hyperlipidemia: Secondary | ICD-10-CM | POA: Diagnosis not present

## 2018-01-24 DIAGNOSIS — J449 Chronic obstructive pulmonary disease, unspecified: Secondary | ICD-10-CM | POA: Diagnosis not present

## 2018-02-17 ENCOUNTER — Encounter: Payer: Self-pay | Admitting: Internal Medicine

## 2018-02-17 DIAGNOSIS — I159 Secondary hypertension, unspecified: Secondary | ICD-10-CM | POA: Diagnosis not present

## 2018-02-17 DIAGNOSIS — K760 Fatty (change of) liver, not elsewhere classified: Secondary | ICD-10-CM | POA: Diagnosis not present

## 2018-02-17 DIAGNOSIS — I110 Hypertensive heart disease with heart failure: Secondary | ICD-10-CM | POA: Diagnosis not present

## 2018-02-17 DIAGNOSIS — R42 Dizziness and giddiness: Secondary | ICD-10-CM | POA: Diagnosis not present

## 2018-02-17 DIAGNOSIS — Z6841 Body Mass Index (BMI) 40.0 and over, adult: Secondary | ICD-10-CM | POA: Diagnosis not present

## 2018-02-17 DIAGNOSIS — R339 Retention of urine, unspecified: Secondary | ICD-10-CM | POA: Diagnosis not present

## 2018-02-17 DIAGNOSIS — I130 Hypertensive heart and chronic kidney disease with heart failure and stage 1 through stage 4 chronic kidney disease, or unspecified chronic kidney disease: Secondary | ICD-10-CM | POA: Diagnosis not present

## 2018-02-17 DIAGNOSIS — I471 Supraventricular tachycardia: Secondary | ICD-10-CM | POA: Diagnosis not present

## 2018-02-17 DIAGNOSIS — E1122 Type 2 diabetes mellitus with diabetic chronic kidney disease: Secondary | ICD-10-CM | POA: Diagnosis not present

## 2018-02-17 DIAGNOSIS — R739 Hyperglycemia, unspecified: Secondary | ICD-10-CM | POA: Diagnosis not present

## 2018-02-17 DIAGNOSIS — I208 Other forms of angina pectoris: Secondary | ICD-10-CM | POA: Diagnosis not present

## 2018-02-17 DIAGNOSIS — I2510 Atherosclerotic heart disease of native coronary artery without angina pectoris: Secondary | ICD-10-CM | POA: Diagnosis not present

## 2018-02-17 DIAGNOSIS — Z743 Need for continuous supervision: Secondary | ICD-10-CM | POA: Diagnosis not present

## 2018-02-17 DIAGNOSIS — R109 Unspecified abdominal pain: Secondary | ICD-10-CM | POA: Diagnosis not present

## 2018-02-17 DIAGNOSIS — N184 Chronic kidney disease, stage 4 (severe): Secondary | ICD-10-CM | POA: Diagnosis not present

## 2018-02-17 DIAGNOSIS — I498 Other specified cardiac arrhythmias: Secondary | ICD-10-CM | POA: Diagnosis not present

## 2018-02-17 DIAGNOSIS — I10 Essential (primary) hypertension: Secondary | ICD-10-CM | POA: Diagnosis not present

## 2018-02-17 DIAGNOSIS — R9431 Abnormal electrocardiogram [ECG] [EKG]: Secondary | ICD-10-CM | POA: Diagnosis not present

## 2018-02-17 DIAGNOSIS — R05 Cough: Secondary | ICD-10-CM | POA: Diagnosis not present

## 2018-02-17 DIAGNOSIS — G319 Degenerative disease of nervous system, unspecified: Secondary | ICD-10-CM | POA: Diagnosis not present

## 2018-02-17 DIAGNOSIS — I509 Heart failure, unspecified: Secondary | ICD-10-CM | POA: Diagnosis not present

## 2018-02-17 DIAGNOSIS — I219 Acute myocardial infarction, unspecified: Secondary | ICD-10-CM | POA: Diagnosis not present

## 2018-02-17 DIAGNOSIS — M25562 Pain in left knee: Secondary | ICD-10-CM | POA: Diagnosis not present

## 2018-02-17 DIAGNOSIS — R55 Syncope and collapse: Secondary | ICD-10-CM | POA: Diagnosis not present

## 2018-02-17 DIAGNOSIS — E871 Hypo-osmolality and hyponatremia: Secondary | ICD-10-CM | POA: Diagnosis not present

## 2018-02-17 DIAGNOSIS — E162 Hypoglycemia, unspecified: Secondary | ICD-10-CM | POA: Diagnosis not present

## 2018-02-17 DIAGNOSIS — S2241XA Multiple fractures of ribs, right side, initial encounter for closed fracture: Secondary | ICD-10-CM | POA: Diagnosis not present

## 2018-02-17 DIAGNOSIS — E1165 Type 2 diabetes mellitus with hyperglycemia: Secondary | ICD-10-CM | POA: Diagnosis not present

## 2018-02-17 DIAGNOSIS — I82409 Acute embolism and thrombosis of unspecified deep veins of unspecified lower extremity: Secondary | ICD-10-CM | POA: Diagnosis not present

## 2018-02-17 DIAGNOSIS — F329 Major depressive disorder, single episode, unspecified: Secondary | ICD-10-CM | POA: Diagnosis not present

## 2018-02-17 DIAGNOSIS — R0602 Shortness of breath: Secondary | ICD-10-CM | POA: Diagnosis not present

## 2018-02-17 DIAGNOSIS — R079 Chest pain, unspecified: Secondary | ICD-10-CM | POA: Diagnosis not present

## 2018-02-17 DIAGNOSIS — N390 Urinary tract infection, site not specified: Secondary | ICD-10-CM | POA: Diagnosis not present

## 2018-02-17 DIAGNOSIS — M25551 Pain in right hip: Secondary | ICD-10-CM | POA: Diagnosis not present

## 2018-02-17 DIAGNOSIS — F339 Major depressive disorder, recurrent, unspecified: Secondary | ICD-10-CM | POA: Diagnosis not present

## 2018-02-17 DIAGNOSIS — I129 Hypertensive chronic kidney disease with stage 1 through stage 4 chronic kidney disease, or unspecified chronic kidney disease: Secondary | ICD-10-CM | POA: Diagnosis not present

## 2018-02-17 DIAGNOSIS — N189 Chronic kidney disease, unspecified: Secondary | ICD-10-CM | POA: Diagnosis not present

## 2018-02-17 DIAGNOSIS — E11649 Type 2 diabetes mellitus with hypoglycemia without coma: Secondary | ICD-10-CM | POA: Diagnosis not present

## 2018-02-17 DIAGNOSIS — J449 Chronic obstructive pulmonary disease, unspecified: Secondary | ICD-10-CM | POA: Diagnosis not present

## 2018-02-17 DIAGNOSIS — R262 Difficulty in walking, not elsewhere classified: Secondary | ICD-10-CM | POA: Diagnosis not present

## 2018-02-17 DIAGNOSIS — E785 Hyperlipidemia, unspecified: Secondary | ICD-10-CM | POA: Diagnosis not present

## 2018-02-17 DIAGNOSIS — I499 Cardiac arrhythmia, unspecified: Secondary | ICD-10-CM | POA: Diagnosis not present

## 2018-02-18 ENCOUNTER — Encounter: Payer: Self-pay | Admitting: Internal Medicine

## 2018-02-21 ENCOUNTER — Other Ambulatory Visit: Payer: Self-pay | Admitting: Internal Medicine

## 2018-02-28 ENCOUNTER — Encounter: Payer: Self-pay | Admitting: Internal Medicine

## 2018-03-01 DIAGNOSIS — E162 Hypoglycemia, unspecified: Secondary | ICD-10-CM | POA: Diagnosis not present

## 2018-03-19 ENCOUNTER — Encounter: Payer: Self-pay | Admitting: Internal Medicine

## 2018-04-17 DIAGNOSIS — E1159 Type 2 diabetes mellitus with other circulatory complications: Secondary | ICD-10-CM | POA: Diagnosis not present

## 2018-04-18 ENCOUNTER — Ambulatory Visit (INDEPENDENT_AMBULATORY_CARE_PROVIDER_SITE_OTHER): Payer: 59 | Admitting: Internal Medicine

## 2018-04-18 ENCOUNTER — Encounter: Payer: Self-pay | Admitting: Internal Medicine

## 2018-04-18 VITALS — BP 138/70 | HR 74 | Ht 74.0 in | Wt 295.0 lb

## 2018-04-18 DIAGNOSIS — I2510 Atherosclerotic heart disease of native coronary artery without angina pectoris: Secondary | ICD-10-CM

## 2018-04-18 DIAGNOSIS — I10 Essential (primary) hypertension: Secondary | ICD-10-CM

## 2018-04-18 DIAGNOSIS — R42 Dizziness and giddiness: Secondary | ICD-10-CM | POA: Diagnosis not present

## 2018-04-18 DIAGNOSIS — E782 Mixed hyperlipidemia: Secondary | ICD-10-CM
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
## 2018-04-18 NOTE — Patient Instructions (Signed)
Your physician recommends that you continue on your current medications as directed. Please refer to the Current Medication list given to you today. Your physician recommends that you return for lab work today (BMET, CBC, TSH, LIPID, [facility]) Your physician recommends that you schedule a follow-up appointment in: November WITH DR. Harrington Challenger. Your physician recommends that you schedule a follow-up appointment WITH YOUR PRIMARY CARE PHYSICIAN.

## 2018-04-19 ENCOUNTER — Telehealth: Payer: Self-pay | Admitting: Internal Medicine

## 2018-04-19 LAB — LIPID PANEL
Chol/HDL Ratio: 5.7 ratio — ABNORMAL HIGH (ref 0.0–5.0)
Cholesterol, Total: 148 mg/dL (ref 100–199)
HDL: 26 mg/dL — ABNORMAL LOW (ref >39)
LDL Calculated: 64 mg/dL (ref 0–99)
TRIGLYCERIDES: 288 mg/dL — AB (ref 0–149)
VLDL Cholesterol Cal: 58 mg/dL — ABNORMAL HIGH (ref 5–40)

## 2018-04-19 LAB — BASIC METABOLIC PANEL
BUN/Creatinine Ratio: 12 (ref 10–24)
BUN: 21 mg/dL (ref 8–27)
CALCIUM: 10.4 mg/dL — AB (ref 8.6–10.2)
CO2: 26 mmol/L (ref 20–29)
Chloride: 98 mmol/L (ref 96–106)
Creatinine, Ser: 1.73 mg/dL — ABNORMAL HIGH (ref 0.76–1.27)
GFR calc Af Amer: 47 mL/min/{1.73_m2} — ABNORMAL LOW (ref >59)
GFR, EST NON AFRICAN AMERICAN: 41 mL/min/{1.73_m2} — AB (ref >59)
GLUCOSE: 185 mg/dL — AB (ref 65–99)
POTASSIUM: 4.3 mmol/L (ref 3.5–5.2)
SODIUM: 140 mmol/L (ref 134–144)

## 2018-04-19 LAB — CBC
HEMATOCRIT: 39.2 % (ref 37.5–51.0)
HEMOGLOBIN: 13 g/dL (ref 13.0–17.7)
MCH: 30.4 pg (ref 26.6–33.0)
MCHC: 33.2 g/dL (ref 31.5–35.7)
MCV: 92 fL (ref 79–97)
Platelets: 275 10*3/uL (ref 150–450)
RBC: 4.28 x10E6/uL (ref 4.14–5.80)
RDW: 15.8 % — AB (ref 12.3–15.4)
WBC: 7.7 10*3/uL (ref 3.4–10.8)

## 2018-04-19 LAB — TSH: TSH: 3.19 u[IU]/mL (ref 0.450–4.500)

## 2018-04-19 LAB — HEMOGLOBIN A1C
ESTIMATED AVERAGE GLUCOSE: 166 mg/dL
Hgb A1c MFr Bld: 7.4 % — ABNORMAL HIGH (ref 4.8–5.6)

## 2018-04-19 NOTE — Telephone Encounter (Signed)
PT AWARE OF LAB RESULTS./CY 

## 2018-04-19 NOTE — Telephone Encounter (Signed)
Follow Up: ° ° °Returning your call,concerning his lab results. °

## 2018-04-29 DIAGNOSIS — Z794 Long term (current) use of insulin: Secondary | ICD-10-CM | POA: Diagnosis not present

## 2018-04-29 DIAGNOSIS — K219 Gastro-esophageal reflux disease without esophagitis: Secondary | ICD-10-CM | POA: Diagnosis not present

## 2018-04-29 DIAGNOSIS — I25118 Atherosclerotic heart disease of native coronary artery with other forms of angina pectoris: Secondary | ICD-10-CM | POA: Diagnosis not present

## 2018-04-29 DIAGNOSIS — I252 Old myocardial infarction: Secondary | ICD-10-CM | POA: Diagnosis not present

## 2018-04-29 DIAGNOSIS — I5022 Chronic systolic (congestive) heart failure: Secondary | ICD-10-CM | POA: Diagnosis not present

## 2018-04-29 DIAGNOSIS — E782 Mixed hyperlipidemia: Secondary | ICD-10-CM | POA: Diagnosis not present

## 2018-04-29 DIAGNOSIS — I130 Hypertensive heart and chronic kidney disease with heart failure and stage 1 through stage 4 chronic kidney disease, or unspecified chronic kidney disease: Secondary | ICD-10-CM | POA: Diagnosis not present

## 2018-04-29 DIAGNOSIS — E1122 Type 2 diabetes mellitus with diabetic chronic kidney disease: Secondary | ICD-10-CM | POA: Diagnosis not present

## 2018-04-29 DIAGNOSIS — N183 Chronic kidney disease, stage 3 (moderate): Secondary | ICD-10-CM | POA: Diagnosis not present

## 2018-05-06 DIAGNOSIS — S83241A Other tear of medial meniscus, current injury, right knee, initial encounter: Secondary | ICD-10-CM | POA: Diagnosis not present

## 2018-05-06 DIAGNOSIS — W19XXXA Unspecified fall, initial encounter: Secondary | ICD-10-CM | POA: Diagnosis not present

## 2018-05-06 DIAGNOSIS — M1711 Unilateral primary osteoarthritis, right knee: Secondary | ICD-10-CM | POA: Diagnosis not present

## 2018-05-06 DIAGNOSIS — M25561 Pain in right knee: Secondary | ICD-10-CM | POA: Diagnosis not present

## 2018-05-06 DIAGNOSIS — M2341 Loose body in knee, right knee: Secondary | ICD-10-CM | POA: Diagnosis not present

## 2018-05-06 DIAGNOSIS — S83281A Other tear of lateral meniscus, current injury, right knee, initial encounter: Secondary | ICD-10-CM | POA: Diagnosis not present

## 2018-05-16 ENCOUNTER — Telehealth: Payer: Self-pay | Admitting: Internal Medicine

## 2018-05-16 NOTE — Telephone Encounter (Signed)
Records rec from [facility]. Placed in Chart Prep.

## 2018-05-20 ENCOUNTER — Other Ambulatory Visit: Payer: Self-pay | Admitting: *Deleted

## 2018-05-20 NOTE — Patient Outreach (Addendum)
[place] [facility]) Care Management  05/20/2018  SAXTON CHAIN July 06, 1951 [id number]   Telephone Screen  Referral Date: 05/13/18 Referral Source: MD referral  Referral Reason: assistance with insulin pump Insurance: [place] attempt #1 was not successful Cm left a HIPAA compliant voice message and Mr Lotito returned the call  Patient is able to verify HIPAA Reviewed and addressed referral to [facility] with patient He reports he does not have to get approved for an insulin pump as stated in the referral  He reports he has an insulin pump that is not working that "a guy is trying to fix for me" that he got from [employer] via [place] assistance years ago   He reports the cost of an insulin pump is $8500 and he was informed by the MD that [facility] could assist with getting him one  Social: He lives alone with support of a brother,  divorced but reports he is independent/assist with all of his care He report that everyday he has little interest in doing things he once enjoyed or feeling down or depressed related to a "bum knee" he got from falling in a [place] hospital in March - April 2019 He reports falling generally related to his knee and in last 3 months has fallen x 4   Conditions: DM 2, HTN, angina pectoris, intermediate coronary syndrome sleep apnea, CKD stage 3, CHF per pt, hyperlipidemia, depressive disorder, lower leg pain   ("bum knee" he got from falling in a [place] hospital in March - April 2019 He reports falling generally related to his knee and in last 3 months has fallen x 4)  Medications: he is presently using insulin pens until he can get an insulin pump He reports that his present insulin is the most expensive medication he has He reports paying from $3-8 for each medication in his home Has 17 pills   Appointments: He denies transportation concerns to MD appointments He reports being seen in the last few months by MDs  Advance Directives:He has a living  will and POA, brother  Consent: [facility] RN CM reviewed [facility] services with patient. Patient gave verbal consent for services for [facility] SW, pharmacy and nursing   Plan: [facility] RN CM will Mr Salay to [facility] SW ( finances, community resources for insulin pump, mental health concerns), nursing (falls, insulin pump) and pharmacy (polypharmacy, insulin pump)  Latisa Belay L. Lavina Hamman, RN, BSN, CCM [facility] Telephonic Care Management Care Coordinator Direct number [phone]  Main [facility] number [phone] Fax number [phone]

## 2018-05-22 ENCOUNTER — Other Ambulatory Visit: Payer: Self-pay | Admitting: Pharmacist

## 2018-05-22 NOTE — Patient Outreach (Signed)
[place] [facility]) Care Management  05/22/2018  Reginald Duncan 11-03-1950 [id number]  67 year old male referred to [place] Management by MD for medication assistance with insulin pump.  [facility] pharmacy has been referred to patient in the past but was unable to engage with patient.     Per review of [facility], patient states he needs a new insulin pump.  He denies having problems paying for medications as he currently receives full Extra Help (copays $8.50 brand name / $3.40 generics).    Unsuccessful call to Mr. Emmert Roethler. I left a HIPAA compliant voicemail on the home / mobile phone.  I also tried to reach patient's emergency contact, Ledell Noss, however mobile phone does not have a voicemail that has been set up.    Plan: I will mail patient a letter describing [facility] services and will try to reach out to him again within 3-4 business days.    Ralene Bathe, PharmD, [place] [phone]

## 2018-05-23 ENCOUNTER — Other Ambulatory Visit: Payer: Self-pay | Admitting: *Deleted

## 2018-05-23 NOTE — Patient Outreach (Signed)
[place] [facility]) Care Management  05/23/2018  Reginald Duncan [birth year] [id number]   CSW attempted to reach pt by phone today foe screening of SW needs. A HIPPA Compliant voice message was left. Noted [facility] RNCM has been able to speak with pt who consented to [place] will plan a second outreach attempt in the next 3-4 days if no return call is received.   Eduard Clos, MSW, [place] Worker  [place] [phone]

## 2018-05-27 ENCOUNTER — Other Ambulatory Visit: Payer: Self-pay | Admitting: *Deleted

## 2018-05-27 ENCOUNTER — Ambulatory Visit: Payer: Self-pay | Admitting: *Deleted

## 2018-05-27 ENCOUNTER — Other Ambulatory Visit: Payer: Self-pay

## 2018-05-27 NOTE — Patient Outreach (Signed)
Telephone assessment: New referral:  Placed call to patient and explained reason for referral. Patient reports to me that his insulin pump is now working and he is doing well.  Reviewed with patient the referral reason for nursing, social worker and pharmacist.  Patient denies any new concerns today for nursing now that his pump is working.  I provided the contact phone number for pharmacist and social worker.  PLAN: close to nursing.  Tomasa Rand, RN, BSN, CEN [facility] [employer] [phone]

## 2018-05-27 NOTE — Patient Outreach (Signed)
[place] [facility]) Care Management  05/27/2018  Reginald Duncan Mar 17, 1951 [id number]   Care coordination  [name] called [facility] RN CM in error inquiring about an appointment on 05/28/18  He was informed of an appointment 05/28/18 at 1600 in which a [facility] pharmacist, C Summe would be calling him to assist with information related to medicine and an insulin pump. He was also informed that [facility] SW, [name] had attempted to reach him. He was given contact numbers for [facility] SW and pharmacist Mr Harrel finally informed CM he was "half asleep when someone called him about a follow up to a surgeon. I think it was my family doctor office. " He plans to all his family MD office to clarify this  He confirmed he had spoken with [facility] RN CM-community staff, [name] today He reports difficulty in sleeping at night and dozing during the day.   Kimberly L. Lavina Hamman, RN, BSN, [place] Coordinator Office number [phone] Mobile number [phone]  Main [facility] number [phone] Fax number [phone]

## 2018-05-27 NOTE — Patient Outreach (Signed)
Telephone assessment:  Attempted to reach patient. No answer. Noted that patient would not engage for [facility] services in the past.  PLAN: will mail outreach letter and attempt again.   Tomasa Rand, RN, BSN, CEN [facility] [employer] [phone]

## 2018-05-27 NOTE — Patient Outreach (Signed)
[place] [facility]) Care Management  05/27/2018  REASE WENCE 06/27/1951 [id number]   CSW was unable to reach pt by phone today and left a HIPPA compliant vice message.. CSW spoke with Tomasa Rand, [place], who states pt is interested in talking with CSW and she provided pt with CSW phone #.  CSW will send pt an unsuccessful outreach letter and plan another phone outreach attempt in the next 3-4 days.  Eduard Clos, MSW, [place] Worker  [place] [phone]

## 2018-05-28 ENCOUNTER — Telehealth: Payer: Self-pay | Admitting: Pharmacist

## 2018-05-28 ENCOUNTER — Ambulatory Visit (INDEPENDENT_AMBULATORY_CARE_PROVIDER_SITE_OTHER): Payer: Self-pay | Admitting: Orthopaedic Surgery

## 2018-05-28 ENCOUNTER — Ambulatory Visit: Payer: Self-pay | Admitting: Pharmacist

## 2018-05-28 NOTE — Patient Outreach (Signed)
[place] [facility]) Care Management  [place]   05/28/2018  Reginald Duncan February 13, 1951 [id number]  Subjective: Patient was called regarding medication assistance. HIPAA identifiers were obtained.  Patient is a 67 year old male with multiple medical conditions including but not limited to:  Sleep apnea, depression, type 2 diabetes, hypercholesterolemia, hypertension, and chronic back pain.   Patient was referred for assistance with obtaining an insulin pump.  At the time of the call, the patient said his friend repaired his pump and he was waiting on his provider to call him with the correct dose of insulin.   Objective:  HgA1c-7.4%  Encounter Medications: Outpatient Encounter Medications as of 05/28/2018  Medication Sig Note  . ALPRAZolam (XANAX) 1 MG tablet Take 1 mg by mouth 2 (two) times daily. For anxiety   . amLODipine (NORVASC) 2.5 MG tablet Take 2.5 mg by mouth daily.   [name] aspirin 81 MG tablet Take 81 mg by mouth daily.   [name] buPROPion (WELLBUTRIN) 100 MG tablet Take 100 mg by mouth at bedtime.    . citalopram (CELEXA) 40 MG tablet Take 40 mg by mouth daily.    . clopidogrel (PLAVIX) 75 MG tablet TAKE 1 TABLET BY MOUTH EVERY DAY WITH BREAKFAST   . HYDROcodone-acetaminophen (NORCO) 7.5-325 MG per tablet Take 1 tablet by mouth every 6 (six) hours as needed. For pain   . levothyroxine (SYNTHROID, LEVOTHROID) 50 MCG tablet Take 50 mcg by mouth daily.   . metoprolol (LOPRESSOR) 100 MG tablet TAKE 1 TABLET(S) BY MOUTH DAILY   . nitroGLYCERIN (NITROSTAT) 0.4 MG SL tablet Place 1 tablet (0.4 mg total) under the tongue every 5 (five) minutes as needed. Chest pain   . pantoprazole (PROTONIX) 40 MG tablet Take 40 mg by mouth daily.   . pregabalin (LYRICA) 75 MG capsule Take 75-150 mg by mouth 2 (two) times daily. Take 75 mg every morning and 150 mg every night   . QUEtiapine (SEROQUEL) 300 MG tablet Take 300 mg by mouth at bedtime.   [name] spironolactone (ALDACTONE) 25 MG tablet  Take 25 mg by mouth 2 (two) times daily.   [name] VASCEPA 1 g CAPS Take 2 capsules by mouth 2 (two) times daily with a meal.   . [DISCONTINUED] nortriptyline (PAMELOR) 25 MG capsule Take 25 mg by mouth at bedtime.     [name] HUMULIN R 500 UNIT/ML injection INJECT 40-50 UNITS DAILY VIA INSULIN PUMP AND AS DIRECTED 05/28/2018: Awaiting new dose from provider  . nortriptyline (PAMELOR) 50 MG capsule TAKE 2 CAPSULES BY MOUTH ONCE DAILY AT BEDTIME   . rosuvastatin (CRESTOR) 20 MG tablet Take 1 tablet (20 mg total) daily by mouth.   . [DISCONTINUED] amLODipine (NORVASC) 2.5 MG tablet Take 1 tablet (2.5 mg total) by mouth daily.   . [DISCONTINUED] buPROPion (WELLBUTRIN SR) 100 MG 12 hr tablet Take 100 mg by mouth daily.   . [DISCONTINUED] fenofibrate micronized (LOFIBRA) 200 MG capsule Take 200 mg by mouth at bedtime.    . [DISCONTINUED] zolpidem (AMBIEN) 10 MG tablet Take 10 mg by mouth at bedtime. for sleep    No facility-administered encounter medications on file as of 05/28/2018.     Functional Status: No flowsheet data found.  Fall/Depression Screening: Fall Risk  09/19/2017  Falls in the past year? Yes  Number falls in past yr: 2 or more  Injury with Fall? No  Risk Factor Category  High Fall Risk  Risk for fall due to : History of fall(s)  Follow up Falls evaluation completed   PHQ 2/9 Scores 05/20/2018 09/19/2017 09/19/2017  PHQ - 2 Score 6 4 3   PHQ- 9 Score - 16 12      Assessment: Patient's medications were reviewed via telephone.   Drugs sorted by system:  Neurologic/Psychologic: Alprazolam, Bupropion, Citalopram, Nortriptyline, Lyrica, Quetiapine,   Cardiovascular: Amlodipine, Aspirin, Clopidogrel, Metoprolol, Nitroglycerin, Rosuvastatin, Spironolactone, Vascepa  Gastrointestinal: Pantoprazole  Endocrine: Humulin R U-500,Levothyroxine,   Pain: Hydrocodone/APAP 7.5/325  Drug interactions:  Risk of CNS depression and increased falls-quetiapine, nortriptyline, alprazolam  hydrocodone/APAP,    Insulin pumps are medical devices and are not billed through prescription drug billing. Insulin Pumps are billed through Medicare Part B which may mean the patient could be responsible for up to 20% of the cost of the pump. Patient quoted the retail price of the pump as $8,000.  Plan: Close patient's pharmacy case as his insulin pump is functional and he is no longer trying to purchase one.  Patient said he did not have an issue getting insulin for the pump.   Elayne Guerin, PharmD, [place] Clinical Pharmacist [phone]

## 2018-05-30 ENCOUNTER — Ambulatory Visit: Payer: Self-pay | Admitting: *Deleted

## 2018-05-30 ENCOUNTER — Other Ambulatory Visit: Payer: Self-pay | Admitting: *Deleted

## 2018-05-30 NOTE — Patient Outreach (Signed)
[place] [facility]) Care Management  05/30/2018  Reginald Duncan 1951-02-27 [id number]   CSW was unable to reach pt by phone today and left a HIPPA compliant voice message. CSW will plan a 3 rd outreach attempt next week if no return call is received.  Eduard Clos, MSW, [place] Worker  [place] [phone]

## 2018-05-31 ENCOUNTER — Ambulatory Visit: Payer: Medicare HMO

## 2018-06-03 ENCOUNTER — Ambulatory Visit: Payer: Self-pay | Admitting: *Deleted

## 2018-06-03 ENCOUNTER — Encounter: Payer: Self-pay | Admitting: *Deleted

## 2018-06-03 ENCOUNTER — Other Ambulatory Visit: Payer: Self-pay | Admitting: *Deleted

## 2018-06-03 ENCOUNTER — Ambulatory Visit (INDEPENDENT_AMBULATORY_CARE_PROVIDER_SITE_OTHER): Payer: Self-pay | Admitting: Orthopaedic Surgery

## 2018-06-03 NOTE — Patient Outreach (Signed)
[place] [facility]) Care Management  06/03/2018  OTHA MONICAL [birth year] [id number]   CSW was unable to reach pt again today. CSW has attempted to make contact with patient 3 times and has sent pt an Unsuccessful outreach letter. CSW will plan case closure and advise [facility] team and PCP.   Eduard Clos, MSW, [place] Worker  [place] [phone]

## 2018-06-17 ENCOUNTER — Ambulatory Visit (INDEPENDENT_AMBULATORY_CARE_PROVIDER_SITE_OTHER): Payer: Self-pay | Admitting: Orthopaedic Surgery

## 2018-06-29 DIAGNOSIS — M5489 Other dorsalgia: Secondary | ICD-10-CM | POA: Diagnosis not present

## 2018-06-29 DIAGNOSIS — I499 Cardiac arrhythmia, unspecified: Secondary | ICD-10-CM | POA: Diagnosis not present

## 2018-06-29 DIAGNOSIS — I213 ST elevation (STEMI) myocardial infarction of unspecified site: Secondary | ICD-10-CM | POA: Diagnosis not present

## 2018-06-29 DIAGNOSIS — R52 Pain, unspecified: Secondary | ICD-10-CM | POA: Diagnosis not present

## 2018-06-29 DIAGNOSIS — M6281 Muscle weakness (generalized): Secondary | ICD-10-CM | POA: Diagnosis not present

## 2018-06-29 DIAGNOSIS — R9431 Abnormal electrocardiogram [ECG] [EKG]: Secondary | ICD-10-CM | POA: Diagnosis not present

## 2018-06-29 DIAGNOSIS — R0602 Shortness of breath: Secondary | ICD-10-CM | POA: Diagnosis not present

## 2018-06-29 DIAGNOSIS — M549 Dorsalgia, unspecified: Secondary | ICD-10-CM | POA: Diagnosis not present

## 2018-07-01 DIAGNOSIS — I4510 Unspecified right bundle-branch block: Secondary | ICD-10-CM | POA: Diagnosis not present

## 2018-07-01 DIAGNOSIS — I440 Atrioventricular block, first degree: Secondary | ICD-10-CM | POA: Diagnosis not present

## 2018-07-06 DIAGNOSIS — N183 Chronic kidney disease, stage 3 (moderate): Secondary | ICD-10-CM | POA: Diagnosis not present

## 2018-07-06 DIAGNOSIS — R0789 Other chest pain: Secondary | ICD-10-CM | POA: Diagnosis not present

## 2018-07-06 DIAGNOSIS — I252 Old myocardial infarction: Secondary | ICD-10-CM | POA: Diagnosis not present

## 2018-07-06 DIAGNOSIS — F329 Major depressive disorder, single episode, unspecified: Secondary | ICD-10-CM | POA: Diagnosis not present

## 2018-07-06 DIAGNOSIS — R0902 Hypoxemia: Secondary | ICD-10-CM | POA: Diagnosis not present

## 2018-07-06 DIAGNOSIS — R9431 Abnormal electrocardiogram [ECG] [EKG]: Secondary | ICD-10-CM | POA: Diagnosis not present

## 2018-07-06 DIAGNOSIS — I129 Hypertensive chronic kidney disease with stage 1 through stage 4 chronic kidney disease, or unspecified chronic kidney disease: Secondary | ICD-10-CM | POA: Diagnosis not present

## 2018-07-06 DIAGNOSIS — M542 Cervicalgia: Secondary | ICD-10-CM | POA: Diagnosis not present

## 2018-07-06 DIAGNOSIS — I517 Cardiomegaly: Secondary | ICD-10-CM | POA: Diagnosis not present

## 2018-07-06 DIAGNOSIS — R079 Chest pain, unspecified: Secondary | ICD-10-CM | POA: Diagnosis not present

## 2018-07-06 DIAGNOSIS — N179 Acute kidney failure, unspecified: Secondary | ICD-10-CM | POA: Insufficient documentation

## 2018-07-06 DIAGNOSIS — G4730 Sleep apnea, unspecified: Secondary | ICD-10-CM | POA: Diagnosis not present

## 2018-07-06 DIAGNOSIS — Z7409 Other reduced mobility: Secondary | ICD-10-CM | POA: Diagnosis not present

## 2018-07-06 DIAGNOSIS — M25511 Pain in right shoulder: Secondary | ICD-10-CM | POA: Diagnosis not present

## 2018-07-06 DIAGNOSIS — I213 ST elevation (STEMI) myocardial infarction of unspecified site: Secondary | ICD-10-CM | POA: Diagnosis not present

## 2018-07-06 DIAGNOSIS — E1122 Type 2 diabetes mellitus with diabetic chronic kidney disease: Secondary | ICD-10-CM | POA: Diagnosis not present

## 2018-07-06 DIAGNOSIS — I440 Atrioventricular block, first degree: Secondary | ICD-10-CM | POA: Diagnosis not present

## 2018-07-06 DIAGNOSIS — E7800 Pure hypercholesterolemia, unspecified: Secondary | ICD-10-CM | POA: Diagnosis not present

## 2018-07-06 DIAGNOSIS — Z794 Long term (current) use of insulin: Secondary | ICD-10-CM | POA: Diagnosis not present

## 2018-07-06 DIAGNOSIS — R0602 Shortness of breath: Secondary | ICD-10-CM | POA: Diagnosis not present

## 2018-07-06 DIAGNOSIS — I2510 Atherosclerotic heart disease of native coronary artery without angina pectoris: Secondary | ICD-10-CM | POA: Diagnosis not present

## 2018-07-06 DIAGNOSIS — R072 Precordial pain: Secondary | ICD-10-CM | POA: Diagnosis not present

## 2018-07-07 DIAGNOSIS — Z794 Long term (current) use of insulin: Secondary | ICD-10-CM | POA: Diagnosis not present

## 2018-07-07 DIAGNOSIS — G4730 Sleep apnea, unspecified: Secondary | ICD-10-CM | POA: Diagnosis not present

## 2018-07-07 DIAGNOSIS — E1122 Type 2 diabetes mellitus with diabetic chronic kidney disease: Secondary | ICD-10-CM | POA: Diagnosis not present

## 2018-07-07 DIAGNOSIS — I252 Old myocardial infarction: Secondary | ICD-10-CM | POA: Diagnosis not present

## 2018-07-07 DIAGNOSIS — N183 Chronic kidney disease, stage 3 (moderate): Secondary | ICD-10-CM | POA: Diagnosis not present

## 2018-07-07 DIAGNOSIS — E7800 Pure hypercholesterolemia, unspecified: Secondary | ICD-10-CM | POA: Diagnosis not present

## 2018-07-07 DIAGNOSIS — N179 Acute kidney failure, unspecified: Secondary | ICD-10-CM | POA: Diagnosis not present

## 2018-07-07 DIAGNOSIS — R079 Chest pain, unspecified: Secondary | ICD-10-CM | POA: Diagnosis not present

## 2018-07-07 DIAGNOSIS — M25511 Pain in right shoulder: Secondary | ICD-10-CM | POA: Diagnosis not present

## 2018-07-07 DIAGNOSIS — I129 Hypertensive chronic kidney disease with stage 1 through stage 4 chronic kidney disease, or unspecified chronic kidney disease: Secondary | ICD-10-CM | POA: Diagnosis not present

## 2018-07-11 ENCOUNTER — Other Ambulatory Visit: Payer: Self-pay | Admitting: *Deleted

## 2018-07-11 NOTE — Patient Outreach (Signed)
[place] [facility]) Care Management  07/11/2018  Reginald Duncan 06/05/1951 [id number]   Telephone Screen  Referral Date: 07/08/18 Referral Source: MD referral  Referral Reason: social worker, needs help at home  Insurance: [place] attempt # 1 No answer. [facility] RN CM left HIPAA compliant voicemail message along with CM's contact info.   Plan: [facility] RN CM sent an unsuccessful outreach letter and scheduled this patient for another call attempt within 4 business days   [name] L. Lavina Hamman, RN, BSN, [place] Coordinator Office number [phone] Mobile number [phone]  Main [facility] number [phone] Fax number [phone]

## 2018-07-15 ENCOUNTER — Other Ambulatory Visit: Payer: Self-pay | Admitting: *Deleted

## 2018-07-15 NOTE — Patient Outreach (Signed)
[facility]) Care Management  07/15/2018  Reginald Duncan 08-20-1951 [id number]   Telephone Screen  Referral Date: 07/08/18 Referral Source: MD referral  Referral Reason: social worker, needs help at home  Insurance: [employer] Medicare   Patient returned a call to [place] RN CM Patient is able to verify HIPAA Reviewed and addressed referral to [facility] with patient Reginald Duncan informs [facility] RN CM that he is having difficulty with his telephone audio volume and is not hearing his phone ringing "although I have the sound up as high as I can get it." He want CM to make sure anyone trying to reach him is aware that he is having difficulty with his phone volume  When asked about availability for calls and home visit Reginald Duncan states he is generally available daily after 12 noon    Reginald Duncan was noted to make attempts to reach CM back at intervals without success  Reginald Duncan on today reports "I have to admitted my train of thought is not good with me nowadays.  I am having trouble remembering. I keep losing my train of thoughts" He also reports having difficulty walking   He discusses various topics to include that a maintenance man spoke with him on last week and encouraged him to get help. He reports 2 ladies in his apartment complex, a daughter and mother offered to assist him on Saturday "I fell two and a half weeks ago in the parking lot [facility]."  He reports a recent admission to [facility] 07/06/18 to 07/07/18 chest pain (care everywhere) He reports issues with walking and "I have a cart to push"  Social:  He lives alone in [place] apartment complex with occasional support of a brother (chris who works out of town),  divorced but reports he is independent/assist with all of his care He report that everyday he has little interest in doing things he once enjoyed or feeling down or depressed related to a "bum knee" he got from falling in a [place]  hospital in March - April 2019 He reports falling generally related to his knee and in last 3 months has fallen x 6   Conditions:  DM 2, HTN, angina pectoris, intermediate coronary syndrome, sleep apnea, CKD stage 3, CHF per pt, hyperlipidemia, depressive disorder, lower leg pain   ("bum knee" he got from falling in a [place] hospital in March - April 2019 He reports falling generally related to his knee and in last 3 months has fallen x 6)  Medications: Continues to report concerns with needing an insulin pump, using insulin pens and the primary MD's NP is attempting to assist  Appointments: He denies transportation concerns to MD appointments He reports being seen in the last few months by MDs reports he was seen 2 months ago by Rollene Rotunda, NP at primary care MD office   Advance Directives: He has a living will and POA, brother  Consent: [facility] RN CM reviewed [facility] services with patient. Patient gave verbal consent for services [facility] RN CM and SW .  Plans [facility] RN CM will refer Reginald Duncan to [facility] community RN CM (frequent falls (x 6 per pt), re admission risks, mobility issues, memory concerns, lives alone, chronic medical issues, poor support system, Need cane & insulin pump) and SW (MD referral to [facility] 07/08/18 for  social worker, needs help at home for poor support systems,  personal care services, community resources) This is the second  referral to theses disciplines by this CM in 2019 Theses disciplines were not able to reach Reginald Duncan during previous attempts Today CM attempted to do a warm transfer to both disciplines while Reginald Duncan was on the line without success.  CM left message for [facility] SW and [employer] RN [employer] L. Lavina Hamman, RN, BSN, [place] Coordinator Office number [phone] Mobile number [phone]  Main THN number [phone] Fax number [phone]

## 2018-07-16 ENCOUNTER — Ambulatory Visit (INDEPENDENT_AMBULATORY_CARE_PROVIDER_SITE_OTHER): Payer: Self-pay | Admitting: Orthopaedic Surgery

## 2018-07-16 ENCOUNTER — Other Ambulatory Visit: Payer: Self-pay

## 2018-07-16 NOTE — Patient Outreach (Signed)
Telephone assessment:  New referral:  Placed call to patient, no answer. Left a message requesting a call back.  According to previous note patient is having difficulty with phone.  PLAN: will attempt again in 24 hours.  Tomasa Rand, RN, BSN, CEN [facility] [employer] [phone]

## 2018-07-17 ENCOUNTER — Other Ambulatory Visit: Payer: Self-pay

## 2018-07-17 DIAGNOSIS — E119 Type 2 diabetes mellitus without complications: Secondary | ICD-10-CM | POA: Diagnosis not present

## 2018-07-17 DIAGNOSIS — S50311A Abrasion of right elbow, initial encounter: Secondary | ICD-10-CM | POA: Diagnosis not present

## 2018-07-17 DIAGNOSIS — R0902 Hypoxemia: Secondary | ICD-10-CM | POA: Diagnosis not present

## 2018-07-17 DIAGNOSIS — Z8673 Personal history of transient ischemic attack (TIA), and cerebral infarction without residual deficits: Secondary | ICD-10-CM | POA: Diagnosis not present

## 2018-07-17 DIAGNOSIS — E161 Other hypoglycemia: Secondary | ICD-10-CM | POA: Diagnosis not present

## 2018-07-17 DIAGNOSIS — S80211A Abrasion, right knee, initial encounter: Secondary | ICD-10-CM | POA: Diagnosis not present

## 2018-07-17 DIAGNOSIS — R52 Pain, unspecified: Secondary | ICD-10-CM | POA: Diagnosis not present

## 2018-07-17 DIAGNOSIS — Z23 Encounter for immunization: Secondary | ICD-10-CM | POA: Diagnosis not present

## 2018-07-17 DIAGNOSIS — W19XXXA Unspecified fall, initial encounter: Secondary | ICD-10-CM | POA: Diagnosis not present

## 2018-07-17 DIAGNOSIS — E162 Hypoglycemia, unspecified: Secondary | ICD-10-CM | POA: Diagnosis not present

## 2018-07-17 NOTE — Patient Outreach (Signed)
Telephone assessment:  Voicemail received from patient to return call.  Text message from patient.  Outgoing call:  Placed call to patient. No answer. Left a message again requesting a call back.  Tomasa Rand, RN, BSN, CEN [facility] [employer] [phone]

## 2018-07-18 ENCOUNTER — Other Ambulatory Visit: Payer: Self-pay

## 2018-07-18 ENCOUNTER — Other Ambulatory Visit: Payer: Self-pay | Admitting: *Deleted

## 2018-07-18 NOTE — Patient Outreach (Signed)
[facility] [place] [facility]) Care Management  07/18/2018  Reginald Duncan 09-Nov-1950 [id number]   Care coordination  [name] called [facility] RN CM to inquire if he had called CM back from the call from last week. CM discussed with [name] that he and CM had spoken. Reminded him of referrals to [facility] commujnity RN CM and SW plus contact was made to [place] CM  He voiced concern about his memory and teleohone issues   He report he has fallen again on yesterday 07/17/18 He reports he fell going in his bathroom His neighbor called EMS and he went to [place] ED. He reports th ED MD informed him of no broken bones and [name] discussed sacral fracture treatment with the ED MD. Unable to see notes for [facility] ED     [facility] RN CM notes in Epic [name] has also spoke with A Lacinda Axon, [facility] community RN CM today  Cm reminds [name] of noted initial home visit for 07/24/18 1030 with [facility] community RN. CM encouraged him to not cancel this home visit and encouraged safety measures in the home. He reports he pushes a cart for mobility assist. He reports he will be safe in his home      [place] L. Lavina Hamman, RN, BSN, [place] Coordinator Office number [phone] Mobile number [phone]  Main THN number [phone] Fax number [phone]

## 2018-07-18 NOTE — Patient Outreach (Signed)
Telephone assessment:  Incoming call from patient who reports that he fell for unknown reason yesterday. Reports he is sore.  Reviewed with patient previous home visit scheduled in 09/2017 and patient cancelled. Patient reports frequent falls.   Offered home visit to assess needs and patient has accepted.  PLAN: initial home visit for 07/24/2018  At 10:30 confirmed address.  Tomasa Rand, RN, BSN, CEN [facility] [employer] [phone]

## 2018-07-18 NOTE — Patient Outreach (Signed)
Telephone assessment:  Received a text message from patient at 8pm on 07/17/2018 reporting that he was in the emergency department with a fall. Request a call back.  11:10 am Called patient back with no answer. Left a message requesting a call back. This is the 3rd call in 3 days in attempts to reach patient and return phone calls.   Tomasa Rand, RN, BSN, CEN [facility] [employer] [phone]

## 2018-07-22 ENCOUNTER — Other Ambulatory Visit: Payer: Self-pay

## 2018-07-22 NOTE — Patient Outreach (Signed)
[facility]) Care Management  07/22/2018  [name] 05/20/1951 [id number]  BSW received an incoming call from the patient, HIPAA identifiers confirmed. BSW introduced self to the patient and discussed the patients recent referral to [place] Management for assistance with community resources. The patient stated "I'm always in denial of what I can and can not do for myself". The patient has a history of frequent falls and contributes this to neuropathy as well as old knee injuries. The patient stated "I will sit in this recliner 24/7 and only get up to use the bathroom". BSW inquired if the patient felt he sat due to physical limitations or feelings or being tired and little to no energy. The patient acknowledged low energy but also discussed chronic back pain.  BSW spoke with the patient about [employer] in home assistance program. The patient is not open to discussing the need for help during today's call. The patient reports a neighbor plans to come to his home today to assist with housekeeping. BSW to mail the patient a program brochure for his review prior to deciding if he is interested in a referral.  BSW spoke to the patient about the [facility] benefit post inpatient stay. When BSW asked the patient is he would like BSW to initiate this referral the patient stated "I just can't make my mind up". BSW encouraged the patient to participate in this benefit as it is covered under his plan and at no cost. The patient is agreeable to BSW placing referral. BSW successfully placed referral and received an estimated delivery date of 10/3.  During today's call, the patient informed BSW he needed to cancel his home visit with [place] due to receiving a reminder call of a physician appointment on the same day. BSW has sent [place] an in-basket message to cancel the appointment and contact the patient to reschedule. BSW has also consulted with  [facility] CSW, Eduard Clos of possible needed intervention to address patients depression pending the results of Ms. Cooks initial home assessment.  Plan: BSW to continue to follow at this time. BSW to contact the patient in the next two weeks to confirm receipt of mailing and assist with linking to desired resources.  Daneen Schick, BSW, CDP [facility] [phone]

## 2018-07-22 NOTE — Patient Outreach (Signed)
[facility]) Care Management  07/22/2018  KAIO KUHLMAN [birth year] [id number]  BSW attempted to contact the patient on today's date to conduct a community resource consult. Unfortunately, today's call was unsuccessful. BSW left the patient a HIPAA compliant voice message requesting a return call.   Plan: BSW will mail the patient an unsuccessful outreach letter. BSW will attempt the patient again within the next four business days.  Daneen Schick, BSW, CDP [facility] [phone]

## 2018-07-23 ENCOUNTER — Other Ambulatory Visit: Payer: Self-pay

## 2018-07-23 ENCOUNTER — Other Ambulatory Visit: Payer: Self-pay | Admitting: *Deleted

## 2018-07-23 NOTE — Patient Outreach (Signed)
[place] [facility]) Care Management  07/23/2018  Reginald Duncan November 17, 1950 [id number]   Care coordination  [facility] RN CM received a voice message from [name] about concern with an appointment [facility] RN CM returned a call to [name].  He voiced concern about not being able to be seen on 07/24/18 by A Lacinda Axon, [place] RN CM related to a conflict with a MD appointment on 07/24/18 [name] states he spoke with this CM on 07/22/18 but ws corrected that he had not spoke with CM since 07/18/18 [facility] RN CM reviewed notes in Epic from [place] and a change in home visit appointment [facility] RN CM discussed with [name] that he had spoken with [facility] office staff earlier today, 07/23/18 and that his 07/24/18 appointment was changed to 07/26/18 at 1130. He voiced understanding and reports this is more convenient for him. He voiced appreciation for CM return call and agrees to the 07/26/18 [place] home visit    Plan [facility] RN CM updated A Lacinda Axon [place] RN CM, that [name] is agreeing to a home visit on 07/26/18 at [place]. Lavina Hamman, RN, BSN, [place] Coordinator Office number [phone] Mobile number [phone]  Main THN number [phone] Fax number [phone]

## 2018-07-23 NOTE — Patient Outreach (Signed)
Telephone assessment:  Patient called the office and cancelled home visit planned cor 07/24/2018.  Patient sent me a text message and requested no visit this week.  Placed call to patient in attempts to reschedule home visit; no answer. Left a message requesting a call back.  Plan: will attempt again if no response.  Tomasa Rand, RN, BSN, CEN [facility] [employer] [phone]

## 2018-07-24 ENCOUNTER — Other Ambulatory Visit: Payer: Self-pay

## 2018-07-24 ENCOUNTER — Ambulatory Visit: Payer: Medicare HMO

## 2018-07-24 NOTE — Patient Outreach (Signed)
Care Coordination: Incoming message from patient that his phone is not ringing. He would like to reschedule home visit for 08/01/2018 at 12:00.  PLAN: will plan home visit on 08/01/2018 at 12 noon.  Tomasa Rand, RN, BSN, CEN [facility] [employer] [phone]

## 2018-07-26 ENCOUNTER — Ambulatory Visit: Payer: Medicare HMO

## 2018-07-31 DIAGNOSIS — I25118 Atherosclerotic heart disease of native coronary artery with other forms of angina pectoris: Secondary | ICD-10-CM | POA: Diagnosis not present

## 2018-07-31 DIAGNOSIS — I5022 Chronic systolic (congestive) heart failure: Secondary | ICD-10-CM | POA: Diagnosis not present

## 2018-07-31 DIAGNOSIS — K219 Gastro-esophageal reflux disease without esophagitis: Secondary | ICD-10-CM | POA: Diagnosis not present

## 2018-07-31 DIAGNOSIS — E1159 Type 2 diabetes mellitus with other circulatory complications: Secondary | ICD-10-CM | POA: Diagnosis not present

## 2018-07-31 DIAGNOSIS — N183 Chronic kidney disease, stage 3 (moderate): Secondary | ICD-10-CM | POA: Diagnosis not present

## 2018-07-31 DIAGNOSIS — E1122 Type 2 diabetes mellitus with diabetic chronic kidney disease: Secondary | ICD-10-CM | POA: Diagnosis not present

## 2018-07-31 DIAGNOSIS — F332 Major depressive disorder, recurrent severe without psychotic features: Secondary | ICD-10-CM | POA: Diagnosis not present

## 2018-07-31 DIAGNOSIS — E782 Mixed hyperlipidemia: Secondary | ICD-10-CM | POA: Diagnosis not present

## 2018-07-31 DIAGNOSIS — I130 Hypertensive heart and chronic kidney disease with heart failure and stage 1 through stage 4 chronic kidney disease, or unspecified chronic kidney disease: Secondary | ICD-10-CM | POA: Diagnosis not present

## 2018-07-31 DIAGNOSIS — J449 Chronic obstructive pulmonary disease, unspecified: Secondary | ICD-10-CM | POA: Diagnosis not present

## 2018-08-01 ENCOUNTER — Other Ambulatory Visit: Payer: Self-pay

## 2018-08-01 NOTE — Patient Outreach (Signed)
[place] [facility]) Care Management   08/01/2018  Reginald Duncan September 17, 1951 [id number]  Reginald Duncan is an 67 y.o. male  Subjective: Reports he has been to the emergency department about 4 times in the last month. ( 2 [place] and 2 in [place].)   Reports recent falls and chest pain.   Patient reports that he has difficulty walking and uses a shopping cart in his apartment and a walker when out of his apartment. Currently gets mobile meals. Is not interested in assistive living. Would like someone to help in his home with cleaning.   Reports hernia problems and needs to make an appointment with central [place] surgery. .  Patient reports that he would be willing to work with PT. Reports walking is improved with using a shopping cart in the apartment and no falls since using shopping cart. Has an order from MD for a walker but prefers shopping cart.   Objective: Home:  Broken down recliner, clutter, disorganized and in need of cleaning. Awake and alert. Unsteady on feet without shopping cart. Awake and alert.   Today's Vitals   08/01/18 1201 08/01/18 1204  BP: 100/60   Pulse: 74   Resp: 16   SpO2: 95%   Weight: 299 lb (135.6 kg)   Height: 1.854 m (6\' 1" )   PainSc:  10-Worst pain ever   Review of Systems  Constitutional: Positive for malaise/fatigue.  HENT: Negative.   Eyes:       Wears reading glasses  Respiratory: Positive for cough, shortness of breath and wheezing.   Cardiovascular:       Reports chest pain 2 weeks ago. Feels like an elephant standing on his chest.   Gastrointestinal: Positive for abdominal pain.  Genitourinary: Negative.   Musculoskeletal: Positive for back pain and falls.  Skin:       Bruised and scabbed from falling  Neurological: Negative.   Endo/Heme/Allergies: Negative.   Psychiatric/Behavioral: Positive for depression and memory loss. The patient has insomnia.     Physical Exam  Constitutional: He is oriented to person, place, and  time. He appears well-developed and well-nourished.  Cardiovascular: Normal rate, regular rhythm, normal heart sounds and intact distal pulses.  Respiratory: Effort normal and breath sounds normal.  GI: Soft. Bowel sounds are normal.  Umbilical hernia noted  Musculoskeletal: Normal range of motion. He exhibits no edema.  Neurological: He is alert and oriented to person, place, and time.  2019, trump, thursday  Skin: Skin is warm and dry.  Both elbows with scabs, both lower legs with scabs from falls. No open skin noted on feet.     Encounter Medications:   Outpatient Encounter Medications as of 08/01/2018  Medication Sig Note  . ALPRAZolam (XANAX) 1 MG tablet Take 1 mg by mouth 2 (two) times daily. For anxiety   . amLODipine (NORVASC) 2.5 MG tablet Take 2.5 mg by mouth daily.   [name] buPROPion (WELLBUTRIN) 100 MG tablet Take 100 mg by mouth at bedtime.    . citalopram (CELEXA) 40 MG tablet Take 40 mg by mouth daily.    . clopidogrel (PLAVIX) 75 MG tablet TAKE 1 TABLET BY MOUTH EVERY DAY WITH BREAKFAST   . HYDROcodone-acetaminophen (NORCO) 7.5-325 MG per tablet Take 1 tablet by mouth every 6 (six) hours as needed. For pain   . insulin regular human CONCENTRATED (HUMULIN R) 500 UNIT/ML injection Inject 20 Units into the skin 2 (two) times daily.   [name] levothyroxine (SYNTHROID, LEVOTHROID) 50 MCG  tablet Take 50 mcg by mouth daily.   . metoprolol (LOPRESSOR) 100 MG tablet 50 mg daily.    . nortriptyline (PAMELOR) 50 MG capsule TAKE 2 CAPSULES BY MOUTH ONCE DAILY AT BEDTIME   . pantoprazole (PROTONIX) 40 MG tablet Take 40 mg by mouth daily.   . pregabalin (LYRICA) 75 MG capsule Take 75-150 mg by mouth 2 (two) times daily. Take 75 mg every morning and 150 mg every night 08/01/2018: Out of lyrica for 3 days. Is ready to be picked up at the drug store.   [name] QUEtiapine (SEROQUEL) 300 MG tablet Take 300 mg by mouth at bedtime.   [name] QUEtiapine (SEROQUEL) 300 MG tablet Take 300 mg by mouth at bedtime.   [name]  spironolactone (ALDACTONE) 25 MG tablet Take 25 mg by mouth 2 (two) times daily.   [name] VASCEPA 1 g CAPS Take 2 capsules by mouth 2 (two) times daily with a meal.   . aspirin 81 MG tablet Take 81 mg by mouth daily.   [name] HUMULIN R 500 UNIT/ML injection INJECT 40-50 UNITS DAILY VIA INSULIN PUMP AND AS DIRECTED 05/28/2018: Awaiting new dose from provider  . nitroGLYCERIN (NITROSTAT) 0.4 MG SL tablet Place 1 tablet (0.4 mg total) under the tongue every 5 (five) minutes as needed. Chest pain (Patient not taking: Reported on 08/01/2018)   . rosuvastatin (CRESTOR) 20 MG tablet Take 1 tablet (20 mg total) daily by mouth. 08/01/2018: Reports taking daily   No facility-administered encounter medications on file as of 08/01/2018.     Functional Status:   In your present state of health, do you have any difficulty performing the following activities: 08/01/2018  Hearing? N  Vision? Y  Comment reading glasses  Difficulty concentrating or making decisions? Y  Walking or climbing stairs? Y  Dressing or bathing? Y  Doing errands, shopping? Y  Comment does not have motivation to go out  Conservation officer, nature and eating ? Y  Comment has mobile meals.  Using the Toilet? N  In the past six months, have you accidently leaked urine? N  Do you have problems with loss of bowel control? N  Managing your Medications? N  Managing your Finances? N  Housekeeping or managing your Housekeeping? Y  Some recent data might be hidden    Fall/Depression Screening:    Fall Risk  08/01/2018 09/19/2017  Falls in the past year? Yes Yes  Number falls in past yr: 2 or more 2 or more  Injury with Fall? Yes No  Comment tears on skin -  Risk Factor Category  High Fall Risk High Fall Risk  Risk for fall due to : History of fall(s) History of fall(s)  Follow up Falls evaluation completed;Falls prevention discussed Falls evaluation completed   PHQ 2/9 Scores 08/01/2018 07/15/2018 05/20/2018 09/19/2017 09/19/2017  PHQ - 2 Score 6 2 6 4 3    PHQ- 9 Score 22 - - 16 12    Assessment:   (1) reviewed THN and provided new patient packet.Reviewed 24 hour nurse line and provided my contact information. Consent reviewed and signed by patient.   (2) last inpatient discharge on 07/07/2018. Frequent ED visits (3) fall risk (4) high depression screening. (5) interested in help getting an insulin pump (6) interested in someone to help clean his apartment. (7) get mobile meals. (8)scabs to skin from falls. (9) DM: average CBG of 180 with no hypoglycemic episodes reported. Does not follow DM diet.  (10) difficulty with cell phone. (11) overall concern for  patient living alone. Plan:  (1) THN consent signed and scanned into chart. Will contact patient in 1 week for follow up on cell phone. (2)reviewed with patient which facility to seek care from. Encouraged patient to see primary MD for non emergency issues.  (3) reviewed fall risk with patient. Will request PT order for strengthing of legs. (4) Reviewed depression screening with patient who reports "its much better than it use to be"  Patient declines counseling at this time. Reviewed importance of taking all medications as prescribed. (5) Will place referral to [facility] [facility] [facility] order for [facility] social worker. (7) Encouraged patient to eat meals.  Reviewed resource of the senior center. (8) reviewed signs and symptoms of infections. Reviewed fall precautions. (9) DM: reviewed with patient averages. A1c from yesterday pending. Encouraged patient to be aware of what he is eating. He reports he knows about diet and does not follow diet due to cost of foods. (10)  Reviewed with patient the importance of answering his phone and making use health care workers can reach him in order to help him. Encouraged patient to go to cellular store today or tomorrow to get his phone checked. He reports that he has insurance on his phone.  (11) reviewed my concern with patient about him living alone  and not being able to take good care of himself.  Reviewed safety concerns with frequent falls. Patient has refused to consider another level of care.   THN CM Care Plan Problem One     Most Recent Value  Care Plan Problem One  Frequent ED visits in the last month.   Role Documenting the Problem One  Care Management [place] for Problem One  Active  THN Long Term Goal   Patient will report decrease number of ED visits in the next 45 days.   THN Long Term Goal Start Date  08/01/18  Interventions for Problem One Long Term Goal  Home visit and assessment completed. Reviewed concerns with patient. Will inquire about PT order from MD.  [facility] CM Short Term Goal #1   Patient will report getting cell phone fixed in the next 4 days.   THN CM Short Term Goal #1 Start Date  08/01/18  Interventions for Short Term Goal #1  reviewed importance of having a working phone. Barrier is lack of motivation to get up and go get new phone.  Encouraged patient and offered support to get phone working.   THN CM Short Term Goal #2   Patient will report no falls in the next 30 days.   THN CM Short Term Goal #2 Start Date  08/01/18  Interventions for Short Term Goal #2  assessment completed. Patient chooses to use a shopping cart in his home vs his walker. He has an RX to get a walker but has chosen not too. Encouraged patient to ambulate as much as possible to increase his strength.  Requested order from MD for advanced home health PT ( patient requested advanced home health)     Tomasa Rand, RN, BSN, CEN [place] Coordinator [phone]

## 2018-08-02 ENCOUNTER — Telehealth: Payer: Self-pay | Admitting: Pharmacist

## 2018-08-02 NOTE — Patient Outreach (Addendum)
[place] [facility]) Care Management  08/02/2018  KENSHIN SPLAWN January 15, 1951 [id number]   [place] [facility]) Care Management  [place]  08/02/2018  MARTE CELANI 07-27-1951 [id number]   Reason for referral: medication assistance with Humulin R U 500 for insulin pump.  Unsuccessful telephone call attempt #1 to patient.   Unsuccessful outreach call outcomes: HIPPAA compliant voicemail left requesting a return call  Plan:  Unsuccessful outreach call plans: I will mail patient an unsuccessful outreach letter.  Call patient back in 3-5 business days.  Elayne Guerin, PharmD, [place] Clinical Pharmacist [phone]

## 2018-08-05 ENCOUNTER — Other Ambulatory Visit: Payer: Self-pay

## 2018-08-05 ENCOUNTER — Ambulatory Visit: Payer: Self-pay

## 2018-08-05 NOTE — Patient Outreach (Signed)
[place] [facility]) Care Management  08/05/2018  Reginald Duncan 1951/01/27 [id number]  Unsuccessful outreach to the patient on today's date. BSW left a HIPAA compliant voice message requesting a return call.  Plan: BSW to outreach the patient within the next four business days if a return call is not received.  Daneen Schick, BSW, CDP [facility] [phone]

## 2018-08-05 NOTE — Patient Outreach (Signed)
[facility]) Care Management  08/05/2018  Reginald Reginald Duncan Reginald Duncan Feb 13, 1951 [id number]  Return call from the patient, HIPAA identifiers confirmed. The patient is unable to confirm receipt of mailed resources stating "I don't check my mail a lot". BSW revisited the idea of a referral to [employer] ([facility]) to assist the patient with light housekeeping. The patient is receptive to this referral.  BSW placed a call to [facility], spoke with Reginald Reginald Duncan. BSW was able to successfully place the patient on the waiting list. The patient is aware there is a waiting list for these services.  Plan: BSW to contact the patient in the next three weeks to confirm he has been contacted by [facility] to arrange an in-home assessment.  Daneen Schick, BSW, CDP [facility] [phone]

## 2018-08-06 ENCOUNTER — Encounter: Payer: Self-pay | Admitting: *Deleted

## 2018-08-06 ENCOUNTER — Other Ambulatory Visit: Payer: Self-pay | Admitting: Pharmacist

## 2018-08-06 ENCOUNTER — Other Ambulatory Visit: Payer: Self-pay | Admitting: *Deleted

## 2018-08-06 ENCOUNTER — Ambulatory Visit: Payer: Self-pay | Admitting: Pharmacist

## 2018-08-06 NOTE — Patient Outreach (Signed)
[facility]) Care Management  08/06/2018  ALONTAE CHALOUX 09/28/1951 [id number]   Reason for referral: medication assistance with Humulin R U 500 for insulin pump.  Unsuccessful telephone call attempt #2 to patient.   Unsuccessful outreach call outcomes: HIPPAA compliant voicemail left requesting a return call  Plan:  Unsuccessful outreach call plans:Janet Marcelline Deist, LCSW sent patient an Unsuccessful Contact letter today   Call patient back in 3-5 business days.  Elayne Guerin, PharmD, [place] Clinical Pharmacist [phone]

## 2018-08-06 NOTE — Patient Outreach (Signed)
[facility]) Care Management  08/06/2018  Reginald Duncan 1951-04-04 [id number]   CSW attempted to reach pt by phone today. Voice message left and will await call back or try again later this week. Pt will send pt an unsuccessful outreach letter as well.   Eduard Clos, MSW, [place] Worker  [place] [phone]

## 2018-08-07 ENCOUNTER — Telehealth: Payer: Self-pay | Admitting: Internal Medicine

## 2018-08-07 NOTE — Telephone Encounter (Signed)
Left message for Reginald Duncan to call back to find out if patient was seen there and if any medication changes were made.

## 2018-08-07 NOTE — Telephone Encounter (Signed)
New Message   Trivonda at [facility] practice os calling because Dr. Oren Beckmann is requesting an earlier appt for the pt due to low blood pressures, dizziness and some falls. 1st available with PA is 11/5 but that not soon enough. Please call

## 2018-08-08 ENCOUNTER — Ambulatory Visit: Payer: Self-pay | Admitting: *Deleted

## 2018-08-08 ENCOUNTER — Other Ambulatory Visit: Payer: Self-pay | Admitting: *Deleted

## 2018-08-08 ENCOUNTER — Other Ambulatory Visit: Payer: Self-pay | Admitting: Pharmacist

## 2018-08-08 NOTE — Patient Outreach (Signed)
[facility]) Care Management  08/08/2018  Reginald Duncan 12-29-1950 [id number]   [place] was called on the patient's behalf.  The representative would not discuss the program with me and said the patient had to call for an assessment and they send out some paperwork for completion. Patient was given the [employer] phone number to reach out:  [phone] ext [phone]. HIPAA identifiers were obtained.   Plan: Call patient back in 3-5 business days to follow up.   Elayne Guerin, PharmD, [place] Clinical Pharmacist [phone]

## 2018-08-08 NOTE — Telephone Encounter (Addendum)
Called patient. "something has changed in past few years, BP used to be good-now when I stand up I start to pass out. Dizzy also when standing to sitting. Dr. Tobie Poet has decreased metoprolol 25 mg once a day. Continues amlodipine 2.5 mg daily, spironolactone 25 mg twice a day.  Reports has passed out or almost passed out multiple times.  Last time was 2 weeks ago, laid on floor for 10 hours- Trouble getting off the floor.  Doesn't remember what happened.  Calls EMS, has gone to hospital a few times for this, sometimes they check him out and he stays home.   [facility] - most recent reading is 80/62, states it is never high.  Adv not to take amlodipine for now. Reviewed with Dr. Meda Coffee (DOD) who adv to all hold BP medications until seen. Scheduled appointment in cardiology on 10/21 with APP. Adv if symptoms recur to call EMS.   Called pt back to advise to hold both amlodipine and metoprolol and spironolactone until seen Monday. Left detailed message stating this on his VM.

## 2018-08-08 NOTE — Patient Outreach (Signed)
[facility] [facility]) Care Management  08/08/2018  NOTNAMED SCHOLZ 1951-04-24 [id number]      CSW was unable to reach pt by phone again today. CSW will try again tomorrow- noted [facility] RPH was able to reach him.    Eduard Clos, MSW, [place] Worker  [place] [phone]

## 2018-08-08 NOTE — Patient Outreach (Signed)
[name]) Care Management  08/08/2018  Reginald Duncan 1951/09/06 [id number]   LATE ENTRY FOR 08/06/2018:  [facility]) Care Management  Reginald Duncan   08/08/2018  Reginald Duncan [birth year] [id number]   Reason for referral: Medication Assistance  Referral source:[facility] Community Nurse, Tomasa Rand  Current insurance:[employer]  HPI: sleep apnea, CKD, depression, type 2 diabetes, hypertension, hyperlipidemia,   Patient said he has all of his medications and can afford them but needs a new pump.       Objective: Lab Results  Component Value Date   CREATININE 1.73 (H) 04/18/2018   CREATININE 2.08 (H) 12/13/2015   CREATININE 2.03 (H) 09/20/2015    Lab Results  Component Value Date   HGBA1C 7.4 (H) 04/18/2018    Lipid Panel     Component Value Date/Time   CHOL 148 04/18/2018 0902   TRIG 288 (H) 04/18/2018 0902   HDL 26 (L) 04/18/2018 0902   CHOLHDL 5.7 (H) 04/18/2018 0902   CHOLHDL 7.5 12/24/2012 0439   VLDL 58 (H) 12/24/2012 0439   LDLCALC 64 04/18/2018 0902   LDLDIRECT 102 (H) 07/18/2007 2043    BP Readings from Last 3 Encounters:  08/01/18 100/60  04/18/18 138/70  10/11/17 98/76    No Known Allergies  Medications Reviewed Today    Reviewed by Elayne Guerin, [place] (Pharmacist) on 08/06/18 at 1451  Med List Status: <None>  Medication Order Taking? Sig Documenting Provider Last Dose Status Informant  ALPRAZolam (XANAX) 1 MG tablet [id number] Yes Take 1 mg by mouth 2 (two) times daily. For anxiety [provider] Taking Active Self  amLODipine (NORVASC) 2.5 MG tablet [id number] Yes Take 2.5 mg by mouth daily. [provider] Taking Active   aspirin 81 MG tablet [id number] Yes Take 81 mg by mouth daily. [provider] Taking Active   buPROPion [facility]) 100 MG tablet [id number] Yes Take 100 mg by mouth at bedtime.  [provider] Taking Active Self  citalopram (CELEXA) 40 MG tablet  [id number] Yes Take 40 mg by mouth daily.  [provider] Taking Active Self           Med Note Reginald Duncan Oct 11, 2017  2:38 PM)    clopidogrel (PLAVIX) 75 MG tablet [id number] Yes TAKE 1 TABLET BY MOUTH EVERY DAY WITH BREAKFAST Reginald Records, MD Taking Active   HUMULIN R 500 UNIT/ML injection [id number] Yes INJECT 40-50 UNITS DAILY VIA INSULIN PUMP AND AS DIRECTED [provider] Taking Active            Med Note Elayne Guerin   Tue May 28, 2018  7:10 PM) Awaiting new dose from provider  HYDROcodone-acetaminophen ([place]) 7.5-325 MG per tablet [id number] Yes Take 1 tablet by mouth every 6 (six) hours as needed. For pain [provider] Taking Active Self  levothyroxine (SYNTHROID, LEVOTHROID) 50 MCG tablet [id number] Yes Take 50 mcg by mouth daily. [provider] Taking Active   metoprolol (LOPRESSOR) 100 MG tablet [id number] Yes 50 mg daily.  [provider] Taking Active            Med Note Elayne Guerin   Tue May 28, 2018  2:49 PM)    nitroGLYCERIN (NITROSTAT) 0.4 MG SL tablet [id number] No Place 1 tablet (0.4 mg total) under the tongue every 5 (five) minutes as needed. Chest pain  Patient not taking:  Reported on 08/01/2018   Reginald Records, MD  Not Taking Active   nortriptyline (PAMELOR) 50 MG capsule [id number] Yes TAKE 2 CAPSULES BY MOUTH ONCE DAILY AT BEDTIME [provider] Taking Active   pantoprazole (PROTONIX) 40 MG tablet [id number] Yes Take 40 mg by mouth 2 (two) times daily.  [provider] Taking Active   pregabalin (LYRICA) 75 MG capsule [id number] Yes Take 75-150 mg by mouth 2 (two) times daily. Take 75 mg every morning and 150 mg every night [provider] Taking Active Self           Med Note [name] Aug 01, 2018 12:10 PM) Out of lyrica for 3 days. Is ready to be picked up at the drug store.         Discontinued 65/99/35 7017 (Duplicate)   QUEtiapine (SEROQUEL) 300 MG tablet [id number]  Yes Take 300 mg by mouth at bedtime. [provider] Taking Active         Discontinued 23/30/07 6226 (Duplicate)   rosuvastatin (CRESTOR) 20 MG tablet [id number]  Take 1 tablet (20 mg total) daily by mouth. Reginald Records, MD  Expired 12/06/17 2359            Med Note Lacinda Axon, Reginald Duncan   Thu Aug 01, 2018 12:11 PM) Reports taking daily  spironolactone (ALDACTONE) 25 MG tablet [id number] Yes Take 25 mg by mouth 2 (two) times daily. [provider] Taking Active            Med Note Elayne Guerin   Tue May 28, 2018  2:52 PM)    VASCEPA 1 g CAPS [id number] Yes Take 2 capsules by mouth 2 (two) times daily with a meal. [provider] Taking Active   Med List Note Reginald Duncan, CPhT 12/23/12 1508): 12/23/12= pt does take ALL meds at bedtime due to memory reasons          Assessment:  Drugs sorted by system:  Neurologic/Psychologic: Alprazolam, Bupropion, Citalopram, Nortriptyline, Pregabalin,Quetiapine  Cardiovascular: Amlodipine, Aspirin, Clopidogrel, Metoprolol, Spironolactone, Vascepa, Rousvastatin, Nitroglycerin  Gastrointestinal: Pantoprazole  Endocrine: Humulin R- U500, Levothyroxine  Pain: Hydrocodone/APAP,   Miscellaneous:  Medication Review Findings:   . Citalopram/Quetiapine-potential QT prolongation  Medication Assistance Findings:  Extra Help:   [x]  Already receiving Full Extra Help  []  Already receiving Partial Extra Help  []  Eligible based on reported income and assets  []  Not Eligible based on reported income and assets   Patient reported he had a Medtronic insulin pump from 2011 but could not find it during our call to give me the serial number.  Insulin pumps are medical devices and are not billed through prescription drug billing. Insulin Pumps are billed through Medicare Part B which may mean the patient could be responsible for up to 20% of the cost of the pump. Patient quoted the retail price of the pump as $8,000  Patient  Assistance Programs: None  No other options identified  Plan: [place] contract contacts (Annisa Wynetta Emery and Billy Fischer) about Insulin Pump Billing  Await a response from [facility]  Call patient back in 5-7 business days.   Elayne Guerin, PharmD, [place] Clinical Pharmacist [phone]

## 2018-08-08 NOTE — Telephone Encounter (Signed)
Patient called back and reviewed the bp meds to hold.  He verbalizes understanding.

## 2018-08-09 ENCOUNTER — Ambulatory Visit: Payer: Self-pay
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
## 2018-08-12 ENCOUNTER — Encounter (HOSPITAL_COMMUNITY): Payer: Self-pay | Admitting: Pharmacy Technician

## 2018-08-12 ENCOUNTER — Ambulatory Visit: Payer: Self-pay | Admitting: *Deleted

## 2018-08-12 ENCOUNTER — Inpatient Hospital Stay (HOSPITAL_COMMUNITY)
Admission: EM | Admit: 2018-08-12 | Discharge: 2018-08-15 | DRG: 312 | Disposition: A | Discharge status: Home Health | Payer: Medicare HMO | Attending: Internal Medicine | Admitting: Internal Medicine

## 2018-08-12 ENCOUNTER — Emergency Department (HOSPITAL_COMMUNITY): Payer: Medicare HMO

## 2018-08-12 ENCOUNTER — Ambulatory Visit: Payer: Medicare HMO | Admitting: Cardiology

## 2018-08-12 ENCOUNTER — Other Ambulatory Visit: Payer: Self-pay

## 2018-08-12 ENCOUNTER — Ambulatory Visit: Payer: Self-pay | Admitting: Pharmacist

## 2018-08-12 ENCOUNTER — Observation Stay (HOSPITAL_COMMUNITY): Payer: Medicare HMO

## 2018-08-12 DIAGNOSIS — R402363 Coma scale, best motor response, obeys commands, at hospital admission: Secondary | ICD-10-CM | POA: Diagnosis present

## 2018-08-12 DIAGNOSIS — R402143 Coma scale, eyes open, spontaneous, at hospital admission: Secondary | ICD-10-CM | POA: Diagnosis present

## 2018-08-12 DIAGNOSIS — R55 Syncope and collapse: Secondary | ICD-10-CM | POA: Diagnosis not present

## 2018-08-12 DIAGNOSIS — N179 Acute kidney failure, unspecified: Secondary | ICD-10-CM | POA: Diagnosis present

## 2018-08-12 DIAGNOSIS — M419 Scoliosis, unspecified: Secondary | ICD-10-CM | POA: Diagnosis present

## 2018-08-12 DIAGNOSIS — Z6839 Body mass index (BMI) 39.0-39.9, adult: Secondary | ICD-10-CM

## 2018-08-12 DIAGNOSIS — G8929 Other chronic pain: Secondary | ICD-10-CM | POA: Diagnosis present

## 2018-08-12 DIAGNOSIS — F419 Anxiety disorder, unspecified: Secondary | ICD-10-CM | POA: Diagnosis present

## 2018-08-12 DIAGNOSIS — G43909 Migraine, unspecified, not intractable, without status migrainosus: Secondary | ICD-10-CM | POA: Diagnosis present

## 2018-08-12 DIAGNOSIS — M545 Low back pain: Secondary | ICD-10-CM | POA: Diagnosis not present

## 2018-08-12 DIAGNOSIS — I452 Bifascicular block: Secondary | ICD-10-CM | POA: Diagnosis not present

## 2018-08-12 DIAGNOSIS — I951 Orthostatic hypotension: Principal | ICD-10-CM | POA: Diagnosis present

## 2018-08-12 DIAGNOSIS — Y92238 Other place in hospital as the place of occurrence of the external cause: Secondary | ICD-10-CM | POA: Diagnosis present

## 2018-08-12 DIAGNOSIS — E7800 Pure hypercholesterolemia, unspecified: Secondary | ICD-10-CM | POA: Diagnosis present

## 2018-08-12 DIAGNOSIS — I2510 Atherosclerotic heart disease of native coronary artery without angina pectoris: Secondary | ICD-10-CM | POA: Diagnosis present

## 2018-08-12 DIAGNOSIS — I499 Cardiac arrhythmia, unspecified: Secondary | ICD-10-CM | POA: Diagnosis not present

## 2018-08-12 DIAGNOSIS — E662 Morbid (severe) obesity with alveolar hypoventilation: Secondary | ICD-10-CM | POA: Diagnosis present

## 2018-08-12 DIAGNOSIS — I252 Old myocardial infarction: Secondary | ICD-10-CM

## 2018-08-12 DIAGNOSIS — K219 Gastro-esophageal reflux disease without esophagitis: Secondary | ICD-10-CM | POA: Diagnosis present

## 2018-08-12 DIAGNOSIS — Z955 Presence of coronary angioplasty implant and graft: Secondary | ICD-10-CM

## 2018-08-12 DIAGNOSIS — I130 Hypertensive heart and chronic kidney disease with heart failure and stage 1 through stage 4 chronic kidney disease, or unspecified chronic kidney disease: Secondary | ICD-10-CM | POA: Diagnosis not present

## 2018-08-12 DIAGNOSIS — E1151 Type 2 diabetes mellitus with diabetic peripheral angiopathy without gangrene: Secondary | ICD-10-CM | POA: Diagnosis present

## 2018-08-12 DIAGNOSIS — R9431 Abnormal electrocardiogram [ECG] [EKG]: Secondary | ICD-10-CM | POA: Diagnosis present

## 2018-08-12 DIAGNOSIS — R402253 Coma scale, best verbal response, oriented, at hospital admission: Secondary | ICD-10-CM | POA: Diagnosis present

## 2018-08-12 DIAGNOSIS — Z79899 Other long term (current) drug therapy: Secondary | ICD-10-CM

## 2018-08-12 DIAGNOSIS — R358 Other polyuria: Secondary | ICD-10-CM | POA: Diagnosis present

## 2018-08-12 DIAGNOSIS — W19XXXA Unspecified fall, initial encounter: Secondary | ICD-10-CM | POA: Diagnosis present

## 2018-08-12 DIAGNOSIS — R000 Tachycardia, unspecified: Secondary | ICD-10-CM | POA: Diagnosis not present

## 2018-08-12 DIAGNOSIS — I5030 Unspecified diastolic (congestive) heart failure: Secondary | ICD-10-CM | POA: Diagnosis not present

## 2018-08-12 DIAGNOSIS — N184 Chronic kidney disease, stage 4 (severe): Secondary | ICD-10-CM | POA: Diagnosis not present

## 2018-08-12 DIAGNOSIS — J441 Chronic obstructive pulmonary disease with (acute) exacerbation: Secondary | ICD-10-CM | POA: Diagnosis present

## 2018-08-12 DIAGNOSIS — Z794 Long term (current) use of insulin: Secondary | ICD-10-CM

## 2018-08-12 DIAGNOSIS — N183 Chronic kidney disease, stage 3 unspecified: Secondary | ICD-10-CM | POA: Diagnosis present

## 2018-08-12 DIAGNOSIS — M549 Dorsalgia, unspecified: Secondary | ICD-10-CM | POA: Diagnosis present

## 2018-08-12 DIAGNOSIS — I472 Ventricular tachycardia: Secondary | ICD-10-CM | POA: Diagnosis present

## 2018-08-12 DIAGNOSIS — E669 Obesity, unspecified: Secondary | ICD-10-CM | POA: Diagnosis not present

## 2018-08-12 DIAGNOSIS — S3992XA Unspecified injury of lower back, initial encounter: Secondary | ICD-10-CM | POA: Diagnosis not present

## 2018-08-12 DIAGNOSIS — M1990 Unspecified osteoarthritis, unspecified site: Secondary | ICD-10-CM | POA: Diagnosis present

## 2018-08-12 DIAGNOSIS — T50915A Adverse effect of multiple unspecified drugs, medicaments and biological substances, initial encounter: Secondary | ICD-10-CM | POA: Diagnosis present

## 2018-08-12 DIAGNOSIS — N189 Chronic kidney disease, unspecified: Secondary | ICD-10-CM | POA: Diagnosis not present

## 2018-08-12 DIAGNOSIS — F329 Major depressive disorder, single episode, unspecified: Secondary | ICD-10-CM | POA: Diagnosis present

## 2018-08-12 DIAGNOSIS — R52 Pain, unspecified: Secondary | ICD-10-CM

## 2018-08-12 DIAGNOSIS — F40240 Claustrophobia: Secondary | ICD-10-CM | POA: Diagnosis present

## 2018-08-12 DIAGNOSIS — E1143 Type 2 diabetes mellitus with diabetic autonomic (poly)neuropathy: Secondary | ICD-10-CM | POA: Diagnosis not present

## 2018-08-12 DIAGNOSIS — G4730 Sleep apnea, unspecified: Secondary | ICD-10-CM | POA: Diagnosis not present

## 2018-08-12 DIAGNOSIS — Z9119 Patient's noncompliance with other medical treatment and regimen: Secondary | ICD-10-CM

## 2018-08-12 DIAGNOSIS — E1122 Type 2 diabetes mellitus with diabetic chronic kidney disease: Secondary | ICD-10-CM | POA: Diagnosis not present

## 2018-08-12 DIAGNOSIS — Z7982 Long term (current) use of aspirin: Secondary | ICD-10-CM

## 2018-08-12 DIAGNOSIS — I69311 Memory deficit following cerebral infarction: Secondary | ICD-10-CM

## 2018-08-12 DIAGNOSIS — Z9641 Presence of insulin pump (external) (internal): Secondary | ICD-10-CM | POA: Diagnosis present

## 2018-08-12 DIAGNOSIS — E119 Type 2 diabetes mellitus without complications: Secondary | ICD-10-CM

## 2018-08-12 DIAGNOSIS — Z79891 Long term (current) use of opiate analgesic: Secondary | ICD-10-CM

## 2018-08-12 DIAGNOSIS — I129 Hypertensive chronic kidney disease with stage 1 through stage 4 chronic kidney disease, or unspecified chronic kidney disease: Secondary | ICD-10-CM | POA: Diagnosis not present

## 2018-08-12 DIAGNOSIS — I10 Essential (primary) hypertension: Secondary | ICD-10-CM | POA: Diagnosis present

## 2018-08-12 DIAGNOSIS — R42 Dizziness and giddiness: Secondary | ICD-10-CM | POA: Diagnosis not present

## 2018-08-12 DIAGNOSIS — I509 Heart failure, unspecified: Secondary | ICD-10-CM | POA: Diagnosis present

## 2018-08-12 DIAGNOSIS — F0390 Unspecified dementia without behavioral disturbance: Secondary | ICD-10-CM | POA: Diagnosis present

## 2018-08-12 DIAGNOSIS — J449 Chronic obstructive pulmonary disease, unspecified: Secondary | ICD-10-CM | POA: Diagnosis present

## 2018-08-12 DIAGNOSIS — Z7902 Long term (current) use of antithrombotics/antiplatelets: Secondary | ICD-10-CM

## 2018-08-12 DIAGNOSIS — M533 Sacrococcygeal disorders, not elsewhere classified: Secondary | ICD-10-CM | POA: Diagnosis present

## 2018-08-12 DIAGNOSIS — R61 Generalized hyperhidrosis: Secondary | ICD-10-CM | POA: Diagnosis not present

## 2018-08-12 LAB — CBC
HCT: 43.1 % (ref 39.0–52.0)
HEMATOCRIT: 43.9 % (ref 39.0–52.0)
HEMOGLOBIN: 13.9 g/dL (ref 13.0–17.0)
Hemoglobin: 13.6 g/dL (ref 13.0–17.0)
MCH: 29.5 pg (ref 26.0–34.0)
MCH: 29.2 pg (ref 26.0–34.0)
MCHC: 31.7 g/dL (ref 30.0–36.0)
MCHC: 31.6 g/dL (ref 30.0–36.0)
MCV: 93.2 fL (ref 80.0–100.0)
MCV: 92.5 fL (ref 80.0–100.0)
NRBC: 0 % (ref 0.0–0.2)
NRBC: 0 % (ref 0.0–0.2)
PLATELETS: 202 10*3/uL (ref 150–400)
Platelets: 206 10*3/uL (ref 150–400)
RBC: 4.71 MIL/uL (ref 4.22–5.81)
RBC: 4.66 MIL/uL (ref 4.22–5.81)
RDW: 14 % (ref 11.5–15.5)
RDW: 13.9 % (ref 11.5–15.5)
WBC: 7.6 10*3/uL (ref 4.0–10.5)
WBC: 7.5 10*3/uL (ref 4.0–10.5)

## 2018-08-12 LAB — CREATININE, SERUM
Creatinine, Ser: 2.27 mg/dL — ABNORMAL HIGH (ref 0.61–1.24)
GFR calc non Af Amer: 28 mL/min — ABNORMAL LOW (ref >60)
GFR, EST AFRICAN AMERICAN: 33 mL/min — AB (ref >60)

## 2018-08-12 LAB — GLUCOSE, CAPILLARY
GLUCOSE-CAPILLARY: 213 mg/dL — AB (ref 70–99)
Glucose-Capillary: 191 mg/dL — ABNORMAL HIGH (ref 70–99)

## 2018-08-12 LAB — BASIC METABOLIC PANEL
ANION GAP: 18 — AB (ref 5–15)
BUN: 26 mg/dL — ABNORMAL HIGH (ref 8–23)
CHLORIDE: 99 mmol/L (ref 98–111)
CO2: 19 mmol/L — ABNORMAL LOW (ref 22–32)
CREATININE: 2.27 mg/dL — AB (ref 0.61–1.24)
Calcium: 10.1 mg/dL (ref 8.9–10.3)
GFR calc non Af Amer: 28 mL/min — ABNORMAL LOW (ref >60)
GFR, EST AFRICAN AMERICAN: 33 mL/min — AB (ref >60)
Glucose, Bld: 240 mg/dL — ABNORMAL HIGH (ref 70–99)
POTASSIUM: 4.4 mmol/L (ref 3.5–5.1)
Sodium: 136 mmol/L (ref 135–145)

## 2018-08-12 LAB — PHOSPHORUS: PHOSPHORUS: 2.8 mg/dL (ref 2.5–4.6)

## 2018-08-12 LAB — I-STAT TROPONIN, ED: Troponin i, poc: 0.01 ng/mL (ref 0.00–0.08)

## 2018-08-12 LAB — BRAIN NATRIURETIC PEPTIDE: B NATRIURETIC PEPTIDE 5: 18.8 pg/mL (ref 0.0–100.0)

## 2018-08-12 LAB — TROPONIN I
Troponin I: <0.03 ng/mL (ref <0.03)
Troponin I: <0.03 ng/mL (ref <0.03)

## 2018-08-12 LAB — TSH: TSH: 1.215 u[IU]/mL (ref 0.350–4.500)

## 2018-08-12 LAB — MAGNESIUM: Magnesium: 1.1 mg/dL — ABNORMAL LOW (ref 1.7–2.4)

## 2018-08-12 MED ORDER — ENOXAPARIN SODIUM 40 MG/0.4ML ~~LOC~~ SOLN
40.0000 mg | SUBCUTANEOUS | Status: DC
  Administered 2018-08-12 – 2018-08-14 (×3): 40 mg via SUBCUTANEOUS
  Filled 2018-08-12 (×3): qty 0.4

## 2018-08-12 MED ORDER — ONDANSETRON HCL 4 MG PO TABS: 4.0000 mg | ORAL_TABLET | Freq: Four times a day (QID) | ORAL | Status: DC | PRN

## 2018-08-12 MED ORDER — INSULIN ASPART 100 UNIT/ML ~~LOC~~ SOLN
0.0000 [IU] | Freq: Three times a day (TID) | SUBCUTANEOUS | Status: DC
  Administered 2018-08-12: 3 [IU] via SUBCUTANEOUS
  Administered 2018-08-13 (×3): 5 [IU] via SUBCUTANEOUS
  Administered 2018-08-14 (×2): 8 [IU] via SUBCUTANEOUS
  Administered 2018-08-14: 5 [IU] via SUBCUTANEOUS
  Administered 2018-08-15: 8 [IU] via SUBCUTANEOUS
  Administered 2018-08-15: 11 [IU] via SUBCUTANEOUS
  Filled 2018-08-12 (×4): qty 1

## 2018-08-12 MED ORDER — SODIUM CHLORIDE 0.9 % IV BOLUS (SEPSIS)
1000.0000 mL | Freq: Once | INTRAVENOUS | Status: AC
  Administered 2018-08-12: 1000 mL via INTRAVENOUS

## 2018-08-12 MED ORDER — PANTOPRAZOLE SODIUM 40 MG PO TBEC
40.0000 mg | DELAYED_RELEASE_TABLET | Freq: Two times a day (BID) | ORAL | Status: DC
  Administered 2018-08-12 – 2018-08-15 (×6): 40 mg via ORAL
  Filled 2018-08-12 (×6): qty 1

## 2018-08-12 MED ORDER — SODIUM CHLORIDE 0.9 % IV SOLN
1000.0000 mL | INTRAVENOUS | Status: DC
  Administered 2018-08-12 – 2018-08-14 (×3): 1000 mL via INTRAVENOUS

## 2018-08-12 MED ORDER — ROSUVASTATIN CALCIUM 10 MG PO TABS
20.0000 mg | ORAL_TABLET | Freq: Every day | ORAL | Status: DC
  Administered 2018-08-12 – 2018-08-15 (×4): 20 mg via ORAL
  Filled 2018-08-12 (×2): qty 2
  Filled 2018-08-12 (×2): qty 1

## 2018-08-12 MED ORDER — SODIUM CHLORIDE 0.9 % IV SOLN: 1000.0000 mL | INTRAVENOUS | Status: DC

## 2018-08-12 MED ORDER — CLOPIDOGREL BISULFATE 75 MG PO TABS
75.0000 mg | ORAL_TABLET | Freq: Every day | ORAL | Status: DC
  Administered 2018-08-13 – 2018-08-15 (×3): 75 mg via ORAL
  Filled 2018-08-12 (×3): qty 1

## 2018-08-12 MED ORDER — MAGNESIUM SULFATE 2 GM/50ML IV SOLN
2.0000 g | Freq: Two times a day (BID) | INTRAVENOUS | Status: AC
  Administered 2018-08-12 – 2018-08-13 (×3): 2 g via INTRAVENOUS
  Filled 2018-08-12 (×3): qty 50

## 2018-08-12 MED ORDER — NITROGLYCERIN 0.4 MG SL SUBL: 0.4000 mg | SUBLINGUAL_TABLET | SUBLINGUAL | Status: DC | PRN

## 2018-08-12 MED ORDER — BISACODYL 5 MG PO TBEC: 5.0000 mg | DELAYED_RELEASE_TABLET | Freq: Every day | ORAL | Status: DC | PRN

## 2018-08-12 MED ORDER — ASPIRIN 81 MG PO CHEW
81.0000 mg | CHEWABLE_TABLET | Freq: Every day | ORAL | Status: DC
  Administered 2018-08-13 – 2018-08-15 (×3): 81 mg via ORAL
  Filled 2018-08-12 (×3): qty 1

## 2018-08-12 MED ORDER — INSULIN REGULAR HUMAN (CONC) 500 UNIT/ML ~~LOC~~ SOPN
15.0000 [IU] | PEN_INJECTOR | Freq: Two times a day (BID) | SUBCUTANEOUS | Status: DC
  Filled 2018-08-12 (×2): qty 3

## 2018-08-12 MED ORDER — CITALOPRAM HYDROBROMIDE 10 MG PO TABS: 40.0000 mg | ORAL_TABLET | Freq: Every day | ORAL | Status: DC

## 2018-08-12 MED ORDER — LEVOTHYROXINE SODIUM 50 MCG PO TABS
50.0000 ug | ORAL_TABLET | Freq: Every day | ORAL | Status: DC
  Administered 2018-08-13 – 2018-08-15 (×3): 50 ug via ORAL
  Filled 2018-08-12 (×3): qty 1

## 2018-08-12 MED ORDER — INSULIN GLARGINE 100 UNIT/ML ~~LOC~~ SOLN
15.0000 [IU] | Freq: Two times a day (BID) | SUBCUTANEOUS | Status: DC
  Administered 2018-08-12 – 2018-08-13 (×3): 15 [IU] via SUBCUTANEOUS
  Filled 2018-08-12 (×4): qty 0.15

## 2018-08-12 MED ORDER — ALPRAZOLAM 0.5 MG PO TABS
1.0000 mg | ORAL_TABLET | Freq: Two times a day (BID) | ORAL | Status: DC
  Administered 2018-08-12: 1 mg via ORAL
  Filled 2018-08-12 (×2): qty 2

## 2018-08-12 MED ORDER — NORTRIPTYLINE HCL 25 MG PO CAPS
100.0000 mg | ORAL_CAPSULE | Freq: Every day | ORAL | Status: DC
  Administered 2018-08-12: 100 mg via ORAL
  Filled 2018-08-12: qty 4

## 2018-08-12 MED ORDER — PREGABALIN 75 MG PO CAPS
75.0000 mg | ORAL_CAPSULE | Freq: Every day | ORAL | Status: DC
  Administered 2018-08-13 – 2018-08-15 (×3): 75 mg via ORAL
  Filled 2018-08-12 (×2): qty 1
  Filled 2018-08-12: qty 3

## 2018-08-12 MED ORDER — BUPROPION HCL 100 MG PO TABS: 100.0000 mg | ORAL_TABLET | Freq: Every day | ORAL | Status: DC

## 2018-08-12 MED ORDER — LIDOCAINE 5 % EX PTCH
1.0000 | MEDICATED_PATCH | CUTANEOUS | Status: DC
  Administered 2018-08-12 – 2018-08-14 (×3): 1 via TRANSDERMAL
  Filled 2018-08-12 (×3): qty 1

## 2018-08-12 MED ORDER — INSULIN ASPART 100 UNIT/ML ~~LOC~~ SOLN
0.0000 [IU] | Freq: Every day | SUBCUTANEOUS | Status: DC
  Administered 2018-08-12 – 2018-08-13 (×2): 2 [IU] via SUBCUTANEOUS
  Filled 2018-08-12: qty 1

## 2018-08-12 MED ORDER — ONDANSETRON HCL 4 MG/2ML IJ SOLN: 4.0000 mg | Freq: Four times a day (QID) | INTRAMUSCULAR | Status: DC | PRN

## 2018-08-12 MED ORDER — PREGABALIN 75 MG PO CAPS
150.0000 mg | ORAL_CAPSULE | Freq: Every day | ORAL | Status: DC
  Administered 2018-08-12: 150 mg via ORAL
  Filled 2018-08-12: qty 2

## 2018-08-12 MED ORDER — HYDROCODONE-ACETAMINOPHEN 7.5-325 MG PO TABS
1.0000 | ORAL_TABLET | Freq: Four times a day (QID) | ORAL | Status: DC | PRN
  Administered 2018-08-12 – 2018-08-15 (×2): 1 via ORAL
  Filled 2018-08-12 (×2): qty 1

## 2018-08-12 MED ORDER — ICOSAPENT ETHYL 1 G PO CAPS
2.0000 | ORAL_CAPSULE | Freq: Two times a day (BID) | ORAL | Status: DC
  Administered 2018-08-13 – 2018-08-15 (×4): 2 g via ORAL
  Filled 2018-08-12 (×4): qty 2

## 2018-08-12 MED ORDER — QUETIAPINE FUMARATE 300 MG PO TABS: 300.0000 mg | ORAL_TABLET | Freq: Every day | ORAL | Status: DC
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
## 2018-08-12 NOTE — ED Notes (Signed)
Portable x-ray at the bedside.  

## 2018-08-12 NOTE — ED Triage Notes (Signed)
Pt bib EMS from PCP office where he had a near syncopal event. Pt recently seen PCP due to hypotension and taken of of his antihypertensive medications on Friday. Pt continues to have low BP. EMS reports pt pt diaphoretic on scene and pt + for orthostatic changes. 100/80, HR 123, 96% RA, RR 22, CBG 249. Pt in NAD upon arrival. Hx 2 stents.
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
## 2018-08-12 NOTE — Patient Outreach (Signed)
Care coordination:  Incoming call/voicemail from patient reporting he is in the hospital "in [place]".   Reviewed Epic and noted that patient in the emergency department.  PLAN: will continue to follow. Tomasa Rand, RN, BSN, CEN [place] Coordinator [phone]

## 2018-08-13 ENCOUNTER — Encounter (HOSPITAL_COMMUNITY): Payer: Self-pay | Admitting: Internal Medicine

## 2018-08-13 ENCOUNTER — Ambulatory Visit (HOSPITAL_BASED_OUTPATIENT_CLINIC_OR_DEPARTMENT_OTHER): Payer: Medicare HMO

## 2018-08-13 ENCOUNTER — Observation Stay (HOSPITAL_BASED_OUTPATIENT_CLINIC_OR_DEPARTMENT_OTHER): Payer: Medicare HMO

## 2018-08-13 DIAGNOSIS — R000 Tachycardia, unspecified: Secondary | ICD-10-CM

## 2018-08-13 DIAGNOSIS — I5030 Unspecified diastolic (congestive) heart failure: Secondary | ICD-10-CM

## 2018-08-13 DIAGNOSIS — M549 Dorsalgia, unspecified: Secondary | ICD-10-CM

## 2018-08-13 DIAGNOSIS — J449 Chronic obstructive pulmonary disease, unspecified: Secondary | ICD-10-CM | POA: Diagnosis present

## 2018-08-13 DIAGNOSIS — I472 Ventricular tachycardia: Secondary | ICD-10-CM

## 2018-08-13 DIAGNOSIS — R55 Syncope and collapse: Secondary | ICD-10-CM

## 2018-08-13 DIAGNOSIS — G8929 Other chronic pain: Secondary | ICD-10-CM | POA: Diagnosis present

## 2018-08-13 DIAGNOSIS — J441 Chronic obstructive pulmonary disease with (acute) exacerbation: Secondary | ICD-10-CM | POA: Diagnosis present

## 2018-08-13 LAB — HIV ANTIBODY (ROUTINE TESTING W REFLEX): HIV SCREEN 4TH GENERATION: NONREACTIVE

## 2018-08-13 LAB — GLUCOSE, CAPILLARY
GLUCOSE-CAPILLARY: 241 mg/dL — AB (ref 70–99)
Glucose-Capillary: 204 mg/dL — ABNORMAL HIGH (ref 70–99)
Glucose-Capillary: 235 mg/dL — ABNORMAL HIGH (ref 70–99)
Glucose-Capillary: 212 mg/dL — ABNORMAL HIGH (ref 70–99)

## 2018-08-13 LAB — HEMOGLOBIN A1C
HEMOGLOBIN A1C: 7.3 % — AB (ref 4.8–5.6)
MEAN PLASMA GLUCOSE: 162.81 mg/dL

## 2018-08-13 LAB — CORTISOL: Cortisol, Plasma: 4.3 ug/dL

## 2018-08-13 LAB — ECHOCARDIOGRAM COMPLETE
Height: 73 in
WEIGHTICAEL: 4783.1 [oz_av]

## 2018-08-13 LAB — TROPONIN I

## 2018-08-13 MED ORDER — HYDRALAZINE HCL 20 MG/ML IJ SOLN
10.0000 mg | Freq: Once | INTRAMUSCULAR | Status: DC
  Filled 2018-08-13: qty 1

## 2018-08-13 MED ORDER — NORTRIPTYLINE HCL 25 MG PO CAPS
75.0000 mg | ORAL_CAPSULE | Freq: Every day | ORAL | Status: DC
  Administered 2018-08-13 – 2018-08-14 (×2): 75 mg via ORAL
  Filled 2018-08-13 (×3): qty 3

## 2018-08-13 MED ORDER — ALPRAZOLAM 0.5 MG PO TABS
0.5000 mg | ORAL_TABLET | Freq: Two times a day (BID) | ORAL | Status: DC
  Administered 2018-08-13 – 2018-08-15 (×4): 0.5 mg via ORAL
  Filled 2018-08-13 (×4): qty 1

## 2018-08-13 MED ORDER — ALUM & MAG HYDROXIDE-SIMETH 200-200-20 MG/5ML PO SUSP: 30.0000 mL | ORAL | Status: DC | PRN

## 2018-08-13 MED ORDER — PREGABALIN 100 MG PO CAPS
100.0000 mg | ORAL_CAPSULE | Freq: Every day | ORAL | Status: DC
  Administered 2018-08-13 – 2018-08-14 (×2): 100 mg via ORAL
  Filled 2018-08-13 (×2): qty 1

## 2018-08-13 MED ORDER — PERFLUTREN LIPID MICROSPHERE
1.0000 mL | INTRAVENOUS | Status: AC | PRN
  Administered 2018-08-13: 4 mL via INTRAVENOUS
  Filled 2018-08-13: qty 10

## 2018-08-13 NOTE — Clinical Social Work Note (Signed)
Clinical Social Work Assessment  Patient Details  Name: Reginald Duncan MRN: [id number] Date of Birth: 1951/01/01  Date of referral:  08/13/18               Reason for consult:  Facility Placement, Discharge Planning                Permission sought to share information with:    Permission granted to share information::  No  Name::        Agency::     Relationship::     Contact Information:     Housing/Transportation Living arrangements for the past 2 months:  Apartment Source of Information:  Patient, Medical Team Patient Interpreter Needed:  None Criminal Activity/Legal Involvement Pertinent to Current Situation/Hospitalization:  No - Comment as needed Significant Relationships:  Siblings Lives with:  Self Do you feel safe going back to the place where you live?  Yes Need for family participation in patient care:  Yes (Comment)  Care giving concerns:  PT recommending SNF once medically stable for discharge.   Social Worker assessment / plan:  CSW met with patient. No supports at bedside. CSW introduced role and explained that PT recommendations would be discussed. Patient refusing SNF. He feels safe returning home alone. He lives in an apartment building and his neighbor will hear when he falls and calls someone. Patient is already working with [facility]. RNCM notified. No further concerns. CSW signing off as social work intervention is no longer needed.  Employment status:  Disabled (Comment on whether or not currently receiving Disability) Insurance information:  Managed Medicare PT Recommendations:  [place] / Referral to community resources:  [place]  Patient/Family's Response to care:  Patient prefers HHPT. Patient's brother supportive and involved in patient's care. Patient appreciated social work intervention.  Patient/Family's Understanding of and Emotional Response to Diagnosis, Current Treatment, and Prognosis:   Patient has a good understanding of the reason for admission and his need for continued therapy after discharge. Patient appears happy with hospital care.  Emotional Assessment Appearance:  Appears stated age Attitude/Demeanor/Rapport:  Engaged, Gracious Affect (typically observed):  Appropriate, Calm, Pleasant Orientation:  Oriented to Self, Oriented to Place, Oriented to  Time, Oriented to Situation Alcohol / Substance use:  Never Used Psych involvement (Current and /or in the community):  No (Comment)  Discharge Needs  Concerns to be addressed:  Care Coordination Readmission within the last 30 days:  No Current discharge risk:  Dependent with Mobility, Lives alone Barriers to Discharge:  Continued Medical Work up   Candie Chroman, LCSW 08/13/2018, 3:23 PM
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
## 2018-08-13 NOTE — Care Management Obs Status (Signed)
[facility] NOTIFICATION   Patient Details  Name: Reginald Duncan MRN: [id number] Date of Birth: 08-Jun-1951   Medicare Observation Status Notification Given:  Yes    Midge Minium RN, BSN, NCM-BC, ACM-RN [phone] 08/13/2018, 4:43 PM

## 2018-08-13 NOTE — Care Management Note (Addendum)
Case Management Note  Patient Details  Name: Reginald Duncan MRN: [id number] Date of Birth: [birth year]  Subjective/Objective:  67yo male presented from home with near syncope and dizzy spells.               Action/Plan: CM attempted x 2 to discuss transitional needs with patient. Per primary nurse, patient is off the floor for vascular testing. CSW met with patient to discuss PT recommendations for ST SNF, with patient declining. Patient indicted he was being followed by [facility]. CM will continue to follow.   Expected Discharge Date:  08/13/18               Expected Discharge Plan:  [place]  In-House Referral:  Clinical Social Work  Discharge planning Services  CM Consult  Post Acute Care Choice:  NA Choice offered to:  NA  DME Arranged:  N/A DME Agency:  NA  HH Arranged:  NA HH Agency:  NA  Status of Service:  In process, will continue to follow  If discussed at Long Length of Stay Meetings, dates discussed:    Additional Comments: 08/13/18 @ 1644-[name] RNCM-CM met with patient to discuss transitional needs and PT recommendations. Patient has declined ST SNF, and prefers to transition home with [facility] services by [facility], with assistance from his (2) neighbors. Patient reported a HHRN and HHPT were ordered by his PCP and was scheduled to start services on 08/12/18, but he was hospitalized. Patient was followed by [facility] PTA. Patient confirmed PCP as: Dr. Darrol Jump; [facility] [place]. Patient verbalized ambulating with a RW, and stated his brother would provide transportation home. CM will continue to follow.   Midge Minium RN, BSN, NCM-BC, ACM-RN [phone] 08/13/2018, 4:11 PM

## 2018-08-13 NOTE — Evaluation (Signed)
Physical Therapy Evaluation Patient Details Name: Reginald Duncan MRN: [id number] DOB: [birth year] Today's Date: 08/13/2018   History of Present Illness  Pt is a 67 y/o male admitted secondary to near syncope. Thought to be secondary to orthostatic hypotension vs. wide complex tachycardia in setting of polypharmacy. PMH includes HTN, DM, CKD, COPD, CAD s/p stent placement, CVA with L arm weakness, and dementia.   Clinical Impression  Pt admitted secondary to problem above with deficits below. Pt requiring mod to max A to stand at EOB with RW and once standing, pt's LEs very shaky. Pt also reporting dizziness and blurred vision during standing, so unable to tolerate further mobility. Pt's BP in supine was 120/80 mmHG, in sitting was 125/84 mmHg, and unable to tolerate BP in standing. Took BP upon return to sitting and BP at 118/59 mmHg. Feel pt is at increased risk for falls and he reports he has had multiple falls within the past few months. Pt also lives alone and feel he will require increased support at d/c. Will continue to follow acutely to maximize functional mobility independence and safety.     Follow Up Recommendations SNF;Supervision/Assistance - 24 hour(pt will likely refuse)    Equipment Recommendations  None recommended by PT    Recommendations for Other Services OT consult     Precautions / Restrictions Precautions Precautions: Fall Precaution Comments: Reports multiple falls within the past 3 months.  Restrictions Weight Bearing Restrictions: No      Mobility  Bed Mobility Overal bed mobility: Needs Assistance Bed Mobility: Supine to Sit;Sit to Supine     Supine to sit: Mod assist Sit to supine: Supervision   General bed mobility comments: Mod A for trunk elevation. BP in supine was 120/80 mmHg, and BP in sitting was 125/84 mmHg.   Transfers Overall transfer level: Needs assistance Equipment used: Rolling walker (2 wheeled) Transfers: Sit to/from Stand Sit to  Stand: Mod assist;Max assist         General transfer comment: Mod to max A for lift assist and steadying. Verbal cues for safe hand placement, however, even with 1 UE on RW, pt tended to tip walker to side. Once standing, pt reporting dizziness, and reported his vision was blurry. Pt's LEs also very shaky upon standing, however, pt reports that is baseline. Unable to tolerate standing BP, however, upon return to sitting BP at 118/59 mmHg. Notified RN.   Ambulation/Gait             General Gait Details: Unable   Stairs            Wheelchair Mobility    Modified Rankin (Stroke Patients Only)       Balance Overall balance assessment: Needs assistance Sitting-balance support: Feet supported;No upper extremity supported Sitting balance-Leahy Scale: Fair     Standing balance support: Bilateral upper extremity supported;During functional activity Standing balance-Leahy Scale: Poor Standing balance comment: Heavy reliance on UEs.                              Pertinent Vitals/Pain Pain Assessment: Faces Faces Pain Scale: Hurts even more Pain Location: back, sacrum Pain Descriptors / Indicators: Grimacing;Guarding Pain Intervention(s): Monitored during session;Limited activity within patient's tolerance;Repositioned    Home Living Family/patient expects to be discharged to:: Private residence Living Arrangements: Alone Available Help at Discharge: Reginald(s);Available PRN/intermittently Type of Home: Apartment Home Access: Elevator     Home Layout: One level Home Equipment: Gilford Rile -  2 wheels;Shower seat      Prior Function Level of Independence: Independent with assistive device(s)         Comments: Reports he spent 23 hours of the day in his chair, and only ambulated with RW when he needed to go to the bathroom or kitchen. Did report increased difficulty with bathing/dressing.      Hand Dominance        Extremity/Trunk Assessment   Upper  Extremity Assessment Upper Extremity Assessment: Defer to OT evaluation    Lower Extremity Assessment Lower Extremity Assessment: Generalized weakness    Cervical / Trunk Assessment Cervical / Trunk Assessment: Kyphotic  Communication   Communication: No difficulties  Cognition Arousal/Alertness: Awake/alert Behavior During Therapy: WFL for tasks assessed/performed Overall Cognitive Status: No family/caregiver present to determine baseline cognitive functioning                                        General Comments      Exercises     Assessment/Plan    PT Assessment Patient needs continued PT services  PT Problem List Decreased strength;Decreased activity tolerance;Decreased balance;Decreased mobility;Decreased knowledge of precautions;Decreased safety awareness;Decreased knowledge of use of DME       PT Treatment Interventions DME instruction;Gait training;Therapeutic activities;Functional mobility training;Therapeutic exercise;Balance training;Patient/family education    PT Goals (Current goals can be found in the Care Plan section)  Acute Rehab PT Goals Patient Stated Goal: to figure out what is going on with his health PT Goal Formulation: With patient Time For Goal Achievement: 08/27/18 Potential to Achieve Goals: Fair    Frequency Min 3X/week   Barriers to discharge Decreased caregiver support      Co-evaluation               AM-PAC PT "6 Clicks" Daily Activity  Outcome Measure Difficulty turning over in bed (including adjusting bedclothes, sheets and blankets)?: A Lot Difficulty moving from lying on back to sitting on the side of the bed? : Unable Difficulty sitting down on and standing up from a chair with arms (e.g., wheelchair, bedside commode, etc,.)?: Unable Help needed moving to and from a bed to chair (including a wheelchair)?: A Lot Help needed walking in hospital room?: A Lot Help needed climbing 3-5 steps with a railing? :  Total 6 Click Score: 9    End of Session Equipment Utilized During Treatment: Gait belt Activity Tolerance: Treatment limited secondary to medical complications (Comment)(dizziness) Patient left: in bed;with call bell/phone within reach Nurse Communication: Mobility status;Other (comment)(Pt's BP ) PT Visit Diagnosis: Unsteadiness on feet (R26.81);Muscle weakness (generalized) (M62.81);Other abnormalities of gait and mobility (R26.89);Difficulty in walking, not elsewhere classified (R26.2);History of falling (Z91.81);Repeated falls (R29.6)    Time: 7829-5621 PT Time Calculation (min) (ACUTE ONLY): 20 min   Charges:   PT Evaluation $PT Eval Moderate Complexity: Bantry, PT, DPT  Acute Rehabilitation Services  Pager: [phone] Office: [phone]   Rudean Hitt 08/13/2018, 2:33 PM

## 2018-08-13 NOTE — Progress Notes (Addendum)
Preliminary results by tech - Carotid duplex completed. No evidence of significant stenosis - 1-39%. Rudell Cobb and [employer].

## 2018-08-13 NOTE — Progress Notes (Signed)
Pt slow to stand on feet, very labored and wobbly while walking from bed to bathroom. Pt able to do so independently but struggling.

## 2018-08-13 NOTE — Progress Notes (Signed)
[facility] PROGRESS NOTE  Reginald Duncan ZJQ:[id number] DOB: 05/04/1951 DOA: 08/12/2018 PCP: Darrol Jump, PA-C  Assessment/Plan: 1.  Near syncope: Secondary to orthostatic hypotension versus wide-complex tachycardia in setting of polypharmacy. Patient reports and chart indicates history positional changes and recent polyuria with uncontrolled blood glucose levels.  Metoprolol Norvasc and Aldactone have been discontinued per chart review. Continue to hold all antihypertensives for now.  He does have significantly prolonged QTC although difficult to interpret in the setting of bundle branch block. Provided with 2 g IV magnesium twice daily for 3 doses given significantly low magnesium level at 1.1. Appears lethargic today. Random cortisol 4.6.  -follow echo -check orthostatics -cardiology consult requested  -obtain ammonia level -decrease altering medication -gentle IV fluids -ACTH stim test tomorrow  2.  Diabetes mellitus type 2: Last hemoglobin A1c in June was 7.4.  He is now off insulin pump and takes concentrated insulin 24 units twice daily.  He admits to noncompliance with strict diabetic diet.  - insulin at a lower dose initiated at admission.  -serum glucose 240 this am -continue SSI for optimal control  3. CAD, ?  History of CHF: chart review indicates hx of significant disease per cards note. Progress note dated 03/2018 Dr Harrington Challenger indicates not amenable to revasc.  Last echo in the system is from 2015 which shows technically limited study but EF around 60%. Chest xray with border cardiomegaly Complains intermittent CP prior to presentaton -gently fluids -Continue Plavix, statins.  Beta-blockers held in concern for problem #1 -await cards recommendation  4.  Sleep apnea: Does not use CPAP due to intolerance  5.  Chronic kidney disease stage III: Appears stable and at baseline.  He was on Aldactone for hypertension and chronic leg swellings but discontinued recently. -hold  nephrotoxins -monitor urine output -recheck  6.  COPD: Stable. No on home oxygen  7.  Chronic back pain: Patient on chronic narcotics hydrocodone twice daily.  Recently had a fall and complaining of severe coccygeal pain.  Lidocain patch "helped". Xray negative for fx -physical therapy -continue pain med at slightly lower dose  8.  Depression: Hold Wellbutrin, Seroquel and Celexa and concern for prolonged QTC  Code Status: full Family Communication: none present Disposition Plan: home hopefully in am   Consultants:  cardmaster  Procedures:  Echo pending  Antibiotics:    HPI/Subjective: Sitting on side of bed states "I feel so weak". Does not want to lie down. Has complete breakfast eating 100% of meal. Denies pain or sob  Patient is a 67 year old male with history of CHF, CKD, COPD, CAD, CVA, who presented 10/21 to  emergency department  for evaluation of a near syncopal episode that occurred just prior to arrival.  Patient stated that he got out of his vehicle today and became lightheaded and felt like he was going to pass out.  He denied chest pain.  Has chronic dyspnea on exertion that he feels is worse.  Stated that he has had issues with near syncopal episodes for the last several weeks.  He had been seen by his PCP recently who stopped all his blood pressure medications.  He had plan to see the cardiologist today and was actually walking into his appointment when he had this episode.  Reported associated diaphoresis and nausea but no vomiting.  Objective: Vitals:   08/13/18 0621 08/13/18 0800  BP: 102/66 (!) 127/91  Pulse: (!) 104 (!) 106  Resp: 18 18  Temp: 98.1 F (36.7 C) (!) 97.5  F (36.4 C)  SpO2: 94% 96%    Intake/Output Summary (Last 24 hours) at 08/13/2018 1257 Last data filed at 08/13/2018 6384 Gross per 24 hour  Intake 511.25 ml  Output 600 ml  Net -88.75 ml   Filed Weights   08/12/18 1151  Weight: 135.6 kg    Exam:   General:  Sitting  on side of bed looking tired/chronically ill, obese in no acute distress  Cardiovascular: rrr no mgr no LE edems  Respiratory: normal effort BS distant but clear. No wheeze or rhonchi  Abdomen: obese soft +BS no guarding or rebounding  Musculoskeletal: joints without swelling/erythema   Data Reviewed: Basic Metabolic Panel: Recent Labs  Lab 08/12/18 1210 08/12/18 1515  NA 136  --   K 4.4  --   CL 99  --   CO2 19*  --   GLUCOSE 240*  --   BUN 26*  --   CREATININE 2.27* 2.27*  CALCIUM 10.1  --   MG  --  1.1*  PHOS  --  2.8   Liver Function Tests: No results for input(s): AST, ALT, ALKPHOS, BILITOT, PROT, ALBUMIN in the last 168 hours. No results for input(s): LIPASE, AMYLASE in the last 168 hours. No results for input(s): AMMONIA in the last 168 hours. CBC: Recent Labs  Lab 08/12/18 1210 08/12/18 1515  WBC 7.6 7.5  HGB 13.9 13.6  HCT 43.9 43.1  MCV 93.2 92.5  PLT 206 202   Cardiac Enzymes: Recent Labs  Lab 08/12/18 1515 08/12/18 2048 08/13/18 0338  TROPONINI <0.03 <0.03 <0.03   BNP (last 3 results) Recent Labs    08/12/18 1255  BNP 18.8    ProBNP (last 3 results) No results for input(s): PROBNP in the last 8760 hours.  CBG: Recent Labs  Lab 08/12/18 1903 08/12/18 2226 08/13/18 0736  GLUCAP 191* 213* 204*    No results found for this or any previous visit (from the past 240 hour(s)).   Studies: Dg Sacrum/coccyx  Result Date: 08/12/2018 CLINICAL DATA:  Syncopal episode, fell on tailbone EXAM: SACRUM AND COCCYX - 2+ VIEW COMPARISON:  None. FINDINGS: SI joints are non widened. Pubic symphysis and rami are intact. No acute displaced fracture. Vascular calcifications. IMPRESSION: Negative. Electronically Signed   By: Donavan Foil M.D.   On: 08/12/2018 18:58   Dg Chest Portable 1 View  Result Date: 08/12/2018 CLINICAL DATA:  Syncope.  History of CHF and hypertension. EXAM: PORTABLE CHEST 1 VIEW COMPARISON:  Chest radiograph July 06, 2018  FINDINGS: Cardiac silhouette is upper limits of normal size. Calcified aortic arch. No pleural effusion or focal consolidation. No pneumothorax. Soft tissue planes and included osseous structures are non suspicious. IMPRESSION: Borderline cardiomegaly.  No acute pulmonary process. Aortic Atherosclerosis (ICD10-I70.0). Electronically Signed   By: Elon Alas M.D.   On: 08/12/2018 13:58    Scheduled Meds: . ALPRAZolam  0.5 mg Oral BID  . aspirin  81 mg Oral Daily  . clopidogrel  75 mg Oral Q breakfast  . enoxaparin (LOVENOX) injection  40 mg Subcutaneous Q24H  . Icosapent Ethyl  2 capsule Oral BID WC  . insulin aspart  0-15 Units Subcutaneous TID WC  . insulin aspart  0-5 Units Subcutaneous QHS  . insulin glargine  15 Units Subcutaneous BID  . levothyroxine  50 mcg Oral Daily  . lidocaine  1 patch Transdermal Q24H  . nortriptyline  75 mg Oral QHS  . pantoprazole  40 mg Oral BID  . pregabalin  100 mg Oral QHS  . pregabalin  75 mg Oral Daily  . rosuvastatin  20 mg Oral Daily   Continuous Infusions: . sodium chloride 1,000 mL (08/13/18 1222)  . magnesium sulfate 1 - 4 g bolus IVPB 2 g (08/13/18 0927)    Principal Problem:   Near syncope Active Problems:   Diabetes (HCC)   HYPERTENSION, BENIGN ESSENTIAL   Coronary atherosclerosis   CKD (chronic kidney disease), stage III (HCC)   COPD (chronic obstructive pulmonary disease) (HCC)   Chronic back pain    Time spent: 40 minutes    [facility]  If 7PM-7AM, please contact night-coverage at [email or web address], password [facility] 08/13/2018, 12:57 PM  LOS: 0 days

## 2018-08-13 NOTE — Progress Notes (Signed)
Received report from RN on [facility] for patient coming to [id number]. Lajoyce Corners, RN

## 2018-08-14 ENCOUNTER — Ambulatory Visit: Payer: Self-pay | Admitting: Pharmacist

## 2018-08-14 DIAGNOSIS — J449 Chronic obstructive pulmonary disease, unspecified: Secondary | ICD-10-CM | POA: Diagnosis present

## 2018-08-14 DIAGNOSIS — E7800 Pure hypercholesterolemia, unspecified: Secondary | ICD-10-CM | POA: Diagnosis present

## 2018-08-14 DIAGNOSIS — I2510 Atherosclerotic heart disease of native coronary artery without angina pectoris: Secondary | ICD-10-CM | POA: Diagnosis present

## 2018-08-14 DIAGNOSIS — R52 Pain, unspecified: Secondary | ICD-10-CM | POA: Diagnosis present

## 2018-08-14 DIAGNOSIS — F40240 Claustrophobia: Secondary | ICD-10-CM | POA: Diagnosis present

## 2018-08-14 DIAGNOSIS — G8929 Other chronic pain: Secondary | ICD-10-CM | POA: Diagnosis present

## 2018-08-14 DIAGNOSIS — E1143 Type 2 diabetes mellitus with diabetic autonomic (poly)neuropathy: Secondary | ICD-10-CM | POA: Diagnosis present

## 2018-08-14 DIAGNOSIS — M419 Scoliosis, unspecified: Secondary | ICD-10-CM | POA: Diagnosis present

## 2018-08-14 DIAGNOSIS — R402363 Coma scale, best motor response, obeys commands, at hospital admission: Secondary | ICD-10-CM | POA: Diagnosis present

## 2018-08-14 DIAGNOSIS — R55 Syncope and collapse: Secondary | ICD-10-CM | POA: Diagnosis not present

## 2018-08-14 DIAGNOSIS — G4730 Sleep apnea, unspecified: Secondary | ICD-10-CM

## 2018-08-14 DIAGNOSIS — E662 Morbid (severe) obesity with alveolar hypoventilation: Secondary | ICD-10-CM | POA: Diagnosis present

## 2018-08-14 DIAGNOSIS — E669 Obesity, unspecified: Secondary | ICD-10-CM

## 2018-08-14 DIAGNOSIS — I130 Hypertensive heart and chronic kidney disease with heart failure and stage 1 through stage 4 chronic kidney disease, or unspecified chronic kidney disease: Secondary | ICD-10-CM | POA: Diagnosis present

## 2018-08-14 DIAGNOSIS — I452 Bifascicular block: Secondary | ICD-10-CM | POA: Diagnosis present

## 2018-08-14 DIAGNOSIS — Y92238 Other place in hospital as the place of occurrence of the external cause: Secondary | ICD-10-CM | POA: Diagnosis present

## 2018-08-14 DIAGNOSIS — N179 Acute kidney failure, unspecified: Secondary | ICD-10-CM | POA: Diagnosis present

## 2018-08-14 DIAGNOSIS — N183 Chronic kidney disease, stage 3 (moderate): Secondary | ICD-10-CM | POA: Diagnosis not present

## 2018-08-14 DIAGNOSIS — E1151 Type 2 diabetes mellitus with diabetic peripheral angiopathy without gangrene: Secondary | ICD-10-CM | POA: Diagnosis present

## 2018-08-14 DIAGNOSIS — R9431 Abnormal electrocardiogram [ECG] [EKG]: Secondary | ICD-10-CM | POA: Diagnosis present

## 2018-08-14 DIAGNOSIS — I509 Heart failure, unspecified: Secondary | ICD-10-CM | POA: Diagnosis present

## 2018-08-14 DIAGNOSIS — R402143 Coma scale, eyes open, spontaneous, at hospital admission: Secondary | ICD-10-CM | POA: Diagnosis present

## 2018-08-14 DIAGNOSIS — E1122 Type 2 diabetes mellitus with diabetic chronic kidney disease: Secondary | ICD-10-CM | POA: Diagnosis present

## 2018-08-14 DIAGNOSIS — W19XXXA Unspecified fall, initial encounter: Secondary | ICD-10-CM | POA: Diagnosis present

## 2018-08-14 DIAGNOSIS — K219 Gastro-esophageal reflux disease without esophagitis: Secondary | ICD-10-CM | POA: Diagnosis present

## 2018-08-14 DIAGNOSIS — F0390 Unspecified dementia without behavioral disturbance: Secondary | ICD-10-CM | POA: Diagnosis present

## 2018-08-14 DIAGNOSIS — I472 Ventricular tachycardia: Secondary | ICD-10-CM | POA: Diagnosis present

## 2018-08-14 DIAGNOSIS — F329 Major depressive disorder, single episode, unspecified: Secondary | ICD-10-CM | POA: Diagnosis present

## 2018-08-14 DIAGNOSIS — I951 Orthostatic hypotension: Secondary | ICD-10-CM | POA: Diagnosis present

## 2018-08-14 DIAGNOSIS — N184 Chronic kidney disease, stage 4 (severe): Secondary | ICD-10-CM | POA: Diagnosis present

## 2018-08-14 LAB — COMPREHENSIVE METABOLIC PANEL
ALK PHOS: 64 U/L (ref 38–126)
ALT: 34 U/L (ref 0–44)
ANION GAP: 11 (ref 5–15)
AST: 33 U/L (ref 15–41)
Albumin: 3.6 g/dL (ref 3.5–5.0)
BILIRUBIN TOTAL: 0.6 mg/dL (ref 0.3–1.2)
BUN: 25 mg/dL — AB (ref 8–23)
CALCIUM: 9.1 mg/dL (ref 8.9–10.3)
CO2: 23 mmol/L (ref 22–32)
Chloride: 102 mmol/L (ref 98–111)
Creatinine, Ser: 1.66 mg/dL — ABNORMAL HIGH (ref 0.61–1.24)
GFR calc non Af Amer: 41 mL/min — ABNORMAL LOW (ref >60)
GFR, EST AFRICAN AMERICAN: 48 mL/min — AB (ref >60)
GLUCOSE: 256 mg/dL — AB (ref 70–99)
Potassium: 4.3 mmol/L (ref 3.5–5.1)
Sodium: 136 mmol/L (ref 135–145)
Total Protein: 6.3 g/dL — ABNORMAL LOW (ref 6.5–8.1)

## 2018-08-14 LAB — GLUCOSE, CAPILLARY
GLUCOSE-CAPILLARY: 260 mg/dL — AB (ref 70–99)
Glucose-Capillary: 233 mg/dL — ABNORMAL HIGH (ref 70–99)
Glucose-Capillary: 264 mg/dL — ABNORMAL HIGH (ref 70–99)
Glucose-Capillary: 188 mg/dL — ABNORMAL HIGH (ref 70–99)

## 2018-08-14 LAB — ACTH STIMULATION, 3 TIME POINTS
CORTISOL BASE: 5 ug/dL
Cortisol, 30 Min: 19.1 ug/dL
Cortisol, 60 Min: 21.9 ug/dL

## 2018-08-14 LAB — MAGNESIUM: Magnesium: 1.6 mg/dL — ABNORMAL LOW (ref 1.7–2.4)

## 2018-08-14 MED ORDER — MAGNESIUM SULFATE 2 GM/50ML IV SOLN
2.0000 g | Freq: Once | INTRAVENOUS | Status: AC
  Administered 2018-08-14: 2 g via INTRAVENOUS
  Filled 2018-08-14: qty 50

## 2018-08-14 MED ORDER — INSULIN GLARGINE 100 UNIT/ML ~~LOC~~ SOLN
20.0000 [IU] | Freq: Two times a day (BID) | SUBCUTANEOUS | Status: DC
  Administered 2018-08-14 (×2): 20 [IU] via SUBCUTANEOUS
  Filled 2018-08-14 (×3): qty 0.2

## 2018-08-14 MED ORDER — NEBIVOLOL HCL 2.5 MG PO TABS
2.5000 mg | ORAL_TABLET | Freq: Every day | ORAL | Status: DC
  Administered 2018-08-14 – 2018-08-15 (×2): 2.5 mg via ORAL
  Filled 2018-08-14 (×3): qty 1

## 2018-08-14 MED ORDER — INSULIN ASPART 100 UNIT/ML ~~LOC~~ SOLN
6.0000 [IU] | Freq: Three times a day (TID) | SUBCUTANEOUS | Status: DC
  Administered 2018-08-14 – 2018-08-15 (×3): 6 [IU] via SUBCUTANEOUS

## 2018-08-14 MED ORDER — COSYNTROPIN 0.25 MG IJ SOLR
0.2500 mg | Freq: Once | INTRAMUSCULAR | Status: AC
  Administered 2018-08-14: 0.25 mg via INTRAVENOUS
  Filled 2018-08-14: qty 0.25

## 2018-08-14 NOTE — Progress Notes (Addendum)
Progress Note    Reginald Duncan  TMA:[id number] DOB: [birth year]  DOA: 08/12/2018 PCP: Darrol Jump, PA-C    Brief Narrative:    Medical records reviewed and are as summarized below:  Patient is a 67 year old male with history of CHF, CKD, COPD, CAD, CVA, who presented 10/21 to  emergency department  for evaluation of a near syncopal episode that occurred just prior to arrival. Patient stated that he got out of his vehicle today and became lightheaded and felt like he was going to pass out. He denied chest pain. Has chronic dyspnea on exertion that he feels is worse. Stated that he has had issues with near syncopal episodes for the last several weeks. He had been seen by his PCP recently who stopped all his blood pressure medications. He had plan to see the cardiologist today and was actually walking into his appointment when he had this episode. Reported associated diaphoresis and nausea but no vomiting.   Assessment/Plan:   Principal Problem:   Near syncope Active Problems:   Diabetes (HCC)   HYPERTENSION, BENIGN ESSENTIAL   Coronary atherosclerosis   Obesity with sleep apnea   CKD (chronic kidney disease), stage III (HCC)   COPD (chronic obstructive pulmonary disease) (HCC)   Chronic back pain   Wide-complex tachycardia (HCC)   Orthostatic hypotension with a near syncopal event -Found to have wide-complex tachycardia -Found to have a low magnesium level: Repleted but still low-- 2 grams IV again -Random cortisol found to be 4.6 - Cosyntropin stim test in progress - Echo: Left ventricle: The cavity size was normal. There was moderate   concentric hypertrophy. Systolic function was normal. The   estimated ejection fraction was in the range of 55% to 60%.   Although no diagnostic regional wall motion abnormality was   identified, this possibility cannot be completely excluded on the   basis of this study. The study is not technically sufficient to   allow  evaluation of LV diastolic function. - Cardiology consult: Recommend hydration and holding medications, can consider starting a low-dose Bystolic as an outpatient, follow-up with Dr. Harrington Challenger, check carotids.   -Bilateral carotids less than 40% -will start low dose bystolic here as patient is consistantly tachy  AKI on CKD III -baseline CR around 1.7 -was >2 upon admission -continue IVF for orthostatic hypotension  Diabetes type 2 - Insulin increased -Sliding scale insulin  Sleep apnea -Does not wear CPAP due to claustrophobia  Chronic back pain - PT consult -Continue chronic narcotics at a lower dose  Depression -Holding Wellbutrin, Seroquel and Celexa for concern of QTC prolongation  obesity Body mass index is 38.85 kg/m.   Family Communication/Anticipated D/C date and plan/Code Status   DVT prophylaxis: Lovenox ordered. Code Status: Full Code.  Family Communication: none Disposition Plan: pending work up   Medical Consultants:    cards     Subjective:   C/o headache and dizziness  Objective:    Vitals:   08/13/18 2051 08/13/18 2229 08/14/18 0100 08/14/18 0428  BP: (!) 134/112 122/90 136/84 (!) 157/93  Pulse:  (!) 104 (!) 103 (!) 113  Resp: 18 16 16 17   Temp: 97.7 F (36.5 C)   97.6 F (36.4 C)  TempSrc: Oral   Axillary  SpO2:  97% 96% 98%  Weight:    135.4 kg  Height: 6' 1.5" (1.867 m)   6' 1.5" (1.867 m)    Intake/Output Summary (Last 24 hours) at 08/14/2018 2563 Last data filed at  08/14/2018 0500 Gross per 24 hour  Intake 2068.12 ml  Output -  Net 2068.12 ml   Filed Weights   08/12/18 1151 08/14/18 0428  Weight: 135.6 kg 135.4 kg    Exam Getting out of bed, working with PT Obese male Mild increase work of breathing Sinus tachy +BS, round abdomen  Data Reviewed:   I have personally reviewed following labs and imaging studies:  Labs: Labs show the following:   Basic Metabolic Panel: Recent Labs  Lab 08/12/18 1210  08/12/18 1515  NA 136  --   K 4.4  --   CL 99  --   CO2 19*  --   GLUCOSE 240*  --   BUN 26*  --   CREATININE 2.27* 2.27*  CALCIUM 10.1  --   MG  --  1.1*  PHOS  --  2.8   GFR Estimated Creatinine Clearance: 46.5 mL/min (A) (by C-G formula based on SCr of 2.27 mg/dL (H)). Liver Function Tests: No results for input(s): AST, ALT, ALKPHOS, BILITOT, PROT, ALBUMIN in the last 168 hours. No results for input(s): LIPASE, AMYLASE in the last 168 hours. No results for input(s): AMMONIA in the last 168 hours. Coagulation profile No results for input(s): INR, PROTIME in the last 168 hours.  CBC: Recent Labs  Lab 08/12/18 1210 08/12/18 1515  WBC 7.6 7.5  HGB 13.9 13.6  HCT 43.9 43.1  MCV 93.2 92.5  PLT 206 202   Cardiac Enzymes: Recent Labs  Lab 08/12/18 1515 08/12/18 2048 08/13/18 0338  TROPONINI <0.03 <0.03 <0.03   BNP (last 3 results) No results for input(s): PROBNP in the last 8760 hours. CBG: Recent Labs  Lab 08/13/18 0736 08/13/18 1202 08/13/18 1806 08/13/18 2146 08/14/18 0611  GLUCAP 204* 241* 235* 212* 233*   D-Dimer: No results for input(s): DDIMER in the last 72 hours. Hgb A1c: Recent Labs    08/13/18 0957  HGBA1C 7.3*   Lipid Profile: No results for input(s): CHOL, HDL, LDLCALC, TRIG, CHOLHDL, LDLDIRECT in the last 72 hours. Thyroid function studies: Recent Labs    08/12/18 1515  TSH 1.215   Anemia work up: No results for input(s): VITAMINB12, FOLATE, FERRITIN, TIBC, IRON, RETICCTPCT in the last 72 hours. Sepsis Labs: Recent Labs  Lab 08/12/18 1210 08/12/18 1515  WBC 7.6 7.5    Microbiology No results found for this or any previous visit (from the past 240 hour(s)).  Procedures and diagnostic studies:  Dg Sacrum/coccyx  Result Date: 08/12/2018 CLINICAL DATA:  Syncopal episode, fell on tailbone EXAM: SACRUM AND COCCYX - 2+ VIEW COMPARISON:  None. FINDINGS: SI joints are non widened. Pubic symphysis and rami are intact. No acute  displaced fracture. Vascular calcifications. IMPRESSION: Negative. Electronically Signed   By: Donavan Foil M.D.   On: 08/12/2018 18:58   Dg Chest Portable 1 View  Result Date: 08/12/2018 CLINICAL DATA:  Syncope.  History of CHF and hypertension. EXAM: PORTABLE CHEST 1 VIEW COMPARISON:  Chest radiograph July 06, 2018 FINDINGS: Cardiac silhouette is upper limits of normal size. Calcified aortic arch. No pleural effusion or focal consolidation. No pneumothorax. Soft tissue planes and included osseous structures are non suspicious. IMPRESSION: Borderline cardiomegaly.  No acute pulmonary process. Aortic Atherosclerosis (ICD10-I70.0). Electronically Signed   By: Elon Alas M.D.   On: 08/12/2018 13:58    Medications:   . ALPRAZolam  0.5 mg Oral BID  . aspirin  81 mg Oral Daily  . clopidogrel  75 mg Oral Q breakfast  .  cosyntropin  0.25 mg Intravenous Once  . enoxaparin (LOVENOX) injection  40 mg Subcutaneous Q24H  . hydrALAZINE  10 mg Intravenous Once  . Icosapent Ethyl  2 capsule Oral BID WC  . insulin aspart  0-15 Units Subcutaneous TID WC  . insulin aspart  0-5 Units Subcutaneous QHS  . insulin glargine  15 Units Subcutaneous BID  . levothyroxine  50 mcg Oral Daily  . lidocaine  1 patch Transdermal Q24H  . nortriptyline  75 mg Oral QHS  . pantoprazole  40 mg Oral BID  . pregabalin  100 mg Oral QHS  . pregabalin  75 mg Oral Daily  . rosuvastatin  20 mg Oral Daily   Continuous Infusions: . sodium chloride 1,000 mL (08/14/18 0627)     LOS: 0 days   Geradine Girt  [facility]   *Please refer to [email or web address], password [email or web address] to get updated schedule on who will round on this patient, as hospitalists switch teams weekly. If 7PM-7AM, please contact night-coverage at [email or web address], password [email or web address] for any overnight needs.  08/14/2018, 9:36 AM

## 2018-08-14 NOTE — Evaluation (Addendum)
Occupational Therapy Evaluation Patient Details Name: Reginald Duncan MRN: [id number] DOB: [birth year] Today's Date: 08/14/2018    History of Present Illness Pt is a 67 y/o male admitted secondary to near syncope. Thought to be secondary to orthostatic hypotension vs. wide complex tachycardia in setting of polypharmacy. PMH includes HTN, DM, CKD, COPD, CAD s/p stent placement, CVA with L arm weakness, and dementia.    Clinical Impression   PTA, pt was living alone and was performing ADLs with increased time and difficulty and functional mobility with a RW. Pt currently requiring Mod A for LB ADLs and functional transfers. Pt with poor balance, strength, and activity tolerance. Pt with dizziness in standing and presents with hypotension; vitals below. Pt would benefit from further acute OT to facilitate safe dc. Discussed therapy needs and pt declining SNF. Recommend dc to home with HHOT for further OT to optimize safety, independence with ADLs, and return to PLOF.   Orthostatic Vital Signs: Supine: 140/84 (105) Sitting: 108/95 (110) Standing: 72/61 (65)  Sitting (2 minutes post standing): 123/88 (110) Sitting (after transfer to chair): 122/102 (108)    Follow Up Recommendations  SNF;Supervision/Assistance - 24 hour;Home health OT(Declining SNF)    Equipment Recommendations  None recommended by OT    Recommendations for Other Services PT consult     Precautions / Restrictions Precautions Precautions: Fall Precaution Comments: Orthostatic hypotension Restrictions Weight Bearing Restrictions: No      Mobility Bed Mobility Overal bed mobility: Needs Assistance Bed Mobility: Supine to Sit     Supine to sit: Mod assist     General bed mobility comments: Mod A for trunk elevation.   Transfers Overall transfer level: Needs assistance Equipment used: Rolling walker (2 wheeled) Transfers: Sit to/from [facility] Sit to Stand: Mod assist;+2  safety/equipment Stand pivot transfers: Min assist;+2 safety/equipment       General transfer comment: Moderate assistance for lift assist. Verbal cueing for  hand placement and upright posture.     Balance Overall balance assessment: Needs assistance Sitting-balance support: Feet supported;No upper extremity supported Sitting balance-Leahy Scale: Fair     Standing balance support: Bilateral upper extremity supported;During functional activity Standing balance-Leahy Scale: Poor Standing balance comment: Moderate reliance on UEs                           ADL either performed or assessed with clinical judgement   ADL Overall ADL's : Needs assistance/impaired Eating/Feeding: Set up;Sitting   Grooming: Set up;Sitting   Upper Body Bathing: Set up;Supervision/ safety;Sitting   Lower Body Bathing: +2 for physical assistance;+2 for safety/equipment;Sit to/from stand;Moderate assistance   Upper Body Dressing : Supervision/safety;Set up;Sitting   Lower Body Dressing: +2 for safety/equipment;Sit to/from stand;Moderate assistance Lower Body Dressing Details (indicate cue type and reason): Pt donning socks by bringing ankles to knees. Requiring increased time and effort. Mod A for sit<>stand Toilet Transfer: Minimal assistance;+2 for safety/equipment;Stand-pivot;RW(simulated to recliner) Toilet Transfer Details (indicate cue type and reason): orthostatic hypotension with mobility. Min A for power up into standing and then for safety in pivot         Functional mobility during ADLs: Minimal assistance;Rolling walker;+2 for safety/equipment;Moderate assistance General ADL Comments: Pt with decreased balance, strength, and activity tolerance. Pt requiring increased time and effort.      Vision         Perception     Praxis      Pertinent Vitals/Pain Pain Assessment: Faces Faces Pain Scale:  Hurts even more Pain Location: neck, back, bilateral knees, calves  (chronic) Pain Descriptors / Indicators: Grimacing;Guarding;Aching Pain Intervention(s): Monitored during session;Limited activity within patient's tolerance;Repositioned     Hand Dominance     Extremity/Trunk Assessment Upper Extremity Assessment Upper Extremity Assessment: Generalized weakness   Lower Extremity Assessment Lower Extremity Assessment: Defer to PT evaluation   Cervical / Trunk Assessment Cervical / Trunk Assessment: Kyphotic   Communication Communication Communication: No difficulties   Cognition Arousal/Alertness: Awake/alert Behavior During Therapy: WFL for tasks assessed/performed Overall Cognitive Status: No family/caregiver present to determine baseline cognitive functioning                                 General Comments: Following simple commands   General Comments  Orthostatic Vital Signs:  Supine: 140/84 (105); Sitting: 108/95 (110); Standing: 72/61 (65); Sitting (2 minutes post standing): 123/88 (110); and Sitting (after transfer to chair): 122/102 (108)    Exercises     Shoulder Instructions      Home Living Family/patient expects to be discharged to:: Private residence Living Arrangements: Alone Available Help at Discharge: Friend(s);Available PRN/intermittently Type of Home: Apartment Home Access: Elevator     Home Layout: One level     Bathroom Shower/Tub: Teacher, early years/pre: Standard     Home Equipment: Environmental consultant - 2 wheels;Shower seat          Prior Functioning/Environment Level of Independence: Independent with assistive device(s)        Comments: Reports he spent 23 hours of the day in his chair, and only ambulated with RW when he needed to go to the bathroom or kitchen. Did report increased difficulty with bathing/dressing.         OT Problem List: Decreased strength;Decreased range of motion;Decreased activity tolerance;Impaired balance (sitting and/or standing);Decreased knowledge of use  of DME or AE;Decreased knowledge of precautions;Pain      OT Treatment/Interventions: Self-care/ADL training;Therapeutic exercise;Energy conservation;DME and/or AE instruction;Therapeutic activities;Patient/family education    OT Goals(Current goals can be found in the care plan section) Acute Rehab OT Goals Patient Stated Goal: to figure out what is going on with his health OT Goal Formulation: With patient Time For Goal Achievement: 08/28/18 Potential to Achieve Goals: Good  OT Frequency: Min 3X/week   Barriers to D/C: Decreased caregiver support  Lives alone       Co-evaluation   Reason for Co-Treatment: For patient/therapist safety;To address functional/ADL transfers PT goals addressed during session: Mobility/safety with mobility        AM-PAC PT "6 Clicks" Daily Activity     Outcome Measure Help from another person eating meals?: None Help from another person taking care of personal grooming?: A Little Help from another person toileting, which includes using toliet, bedpan, or urinal?: A Lot Help from another person bathing (including washing, rinsing, drying)?: A Lot Help from another person to put on and taking off regular upper body clothing?: A Little Help from another person to put on and taking off regular lower body clothing?: A Lot 6 Click Score: 16   End of Session Equipment Utilized During Treatment: Gait belt;Rolling walker Nurse Communication: Mobility status;Other (comment)(hypotension)  Activity Tolerance: Patient tolerated treatment well;Patient limited by pain Patient left: in chair;with call bell/phone within reach  OT Visit Diagnosis: Unsteadiness on feet (R26.81);Other abnormalities of gait and mobility (R26.89);Muscle weakness (generalized) (M62.81)  Time: 0638-6854 OT Time Calculation (min): 26 min Charges:  OT General Charges $OT Visit: 1 Visit OT Evaluation $OT Eval Moderate Complexity: [place],  OTR/L Acute Rehab Pager: [phone] Office: [place] 08/14/2018, 3:36 PM

## 2018-08-14 NOTE — Progress Notes (Signed)
Physical Therapy Treatment Patient Details Name: Reginald Duncan MRN: [id number] DOB: Apr 14, 1951 Today's Date: 08/14/2018    History of Present Illness Pt is a 67 y/o male admitted secondary to near syncope. Thought to be secondary to orthostatic hypotension vs. wide complex tachycardia in setting of polypharmacy. PMH includes HTN, DM, CKD, COPD, CAD s/p stent placement, CVA with L arm weakness, and dementia.     PT Comments    Patient progressing slowly towards his goals. Continues to be limited by symptomatic orthostatic hypotension (see below) so ambulation deferred. Requiring up to moderate assistance for bed mobility and transfers from bed to chair using walker. Verbalizing increased, mostly generalized pain, with movement in addition to dizziness. Displays poor balance and generalized debility/weakness. Continue to recommend SNF due to patient's high fall risk.   Orthostatic Vital Signs: Supine: 140/84 (105) Sitting: 108/95 (110) Standing: 72/61 (65)  Sitting (2 minutes post standing): 123/88 (110) Sitting (after transfer to chair): 122/102 (108)   Follow Up Recommendations  SNF;Supervision/Assistance - 24 hour(pt refusing)     Equipment Recommendations  None recommended by PT    Recommendations for Other Services       Precautions / Restrictions Precautions Precautions: Fall Precaution Comments: Orthostatic hypotension Restrictions Weight Bearing Restrictions: No    Mobility  Bed Mobility Overal bed mobility: Needs Assistance Bed Mobility: Supine to Sit     Supine to sit: Mod assist     General bed mobility comments: Mod A for trunk elevation.   Transfers Overall transfer level: Needs assistance Equipment used: Rolling walker (2 wheeled) Transfers: Sit to/from [facility] Sit to Stand: Mod assist;+2 safety/equipment Stand pivot transfers: Min assist;+2 safety/equipment       General transfer comment: Moderate assistance for lift assist.  Verbal cueing for  hand placement and upright posture.   Ambulation/Gait             General Gait Details: Deferred due to hypotension   Stairs             Wheelchair Mobility    Modified Rankin (Stroke Patients Only)       Balance Overall balance assessment: Needs assistance Sitting-balance support: Feet supported;No upper extremity supported Sitting balance-Leahy Scale: Fair     Standing balance support: Bilateral upper extremity supported;During functional activity Standing balance-Leahy Scale: Poor Standing balance comment: Moderate reliance on UEs                            Cognition Arousal/Alertness: Awake/alert Behavior During Therapy: WFL for tasks assessed/performed Overall Cognitive Status: No family/caregiver present to determine baseline cognitive functioning                                        Exercises      General Comments        Pertinent Vitals/Pain Pain Assessment: Faces Faces Pain Scale: Hurts even more Pain Location: neck, back, bilateral knees, calves (chronic) Pain Descriptors / Indicators: Grimacing;Guarding;Aching Pain Intervention(s): Monitored during session    Home Living                      Prior Function            PT Goals (current goals can now be found in the care plan section) Acute Rehab PT Goals Patient Stated Goal: to figure out what is  going on with his health PT Goal Formulation: With patient Time For Goal Achievement: 08/27/18 Potential to Achieve Goals: Fair Progress towards PT goals: Progressing toward goals    Frequency    Min 3X/week      PT Plan Current plan remains appropriate    Co-evaluation PT/OT/SLP Co-Evaluation/Treatment: Yes Reason for Co-Treatment: To address functional/ADL transfers;For patient/therapist safety PT goals addressed during session: Mobility/safety with mobility        AM-PAC PT "6 Clicks" Daily Activity  Outcome  Measure  Difficulty turning over in bed (including adjusting bedclothes, sheets and blankets)?: A Lot Difficulty moving from lying on back to sitting on the side of the bed? : Unable Difficulty sitting down on and standing up from a chair with arms (e.g., wheelchair, bedside commode, etc,.)?: Unable Help needed moving to and from a bed to chair (including a wheelchair)?: A Lot Help needed walking in hospital room?: A Lot Help needed climbing 3-5 steps with a railing? : Total 6 Click Score: 9    End of Session Equipment Utilized During Treatment: Gait belt Activity Tolerance: Treatment limited secondary to medical complications (Comment)(orthostatic hypotension) Patient left: with call bell/phone within reach;in chair Nurse Communication: Mobility status PT Visit Diagnosis: Unsteadiness on feet (R26.81);Muscle weakness (generalized) (M62.81);Other abnormalities of gait and mobility (R26.89);Difficulty in walking, not elsewhere classified (R26.2);History of falling (Z91.81);Repeated falls (R29.6)     Time: 0388-8280 PT Time Calculation (min) (ACUTE ONLY): 26 min  Charges:  $Therapeutic Activity: 8-22 mins                     Ellamae Sia, PT, DPT Acute Rehabilitation Services Pager [phone] Office [phone]    Willy Eddy 08/14/2018, 9:28 AM

## 2018-08-14 NOTE — Progress Notes (Addendum)
Inpatient Diabetes Program Recommendations  AACE/ADA: New Consensus Statement on Inpatient Glycemic Control (2015)  Target Ranges:  Prepandial:   less than 140 mg/dL      Peak postprandial:   less than 180 mg/dL (1-2 hours)      Critically ill patients:  140 - 180 mg/dL   Lab Results  Component Value Date   GLUCAP 233 (H) 08/14/2018   HGBA1C 7.3 (H) 08/13/2018    Review of Glycemic ControlResults for Reginald, Duncan (MRN [id number]) as of 08/14/2018 09:48  Ref. Range 08/13/2018 07:36 08/13/2018 12:02 08/13/2018 18:06 08/13/2018 21:46 08/14/2018 06:11  Glucose-Capillary Latest Ref Range: 70 - 99 mg/dL 204 (H) 241 (H) 235 (H) 212 (H) 233 (H)    Diabetes history: Type 2 DM  Outpatient Diabetes medications: U500 24 units bid Current orders for Inpatient glycemic control:  Novolog moderate tid with meals and HS, Lantus 20 units bid  Inpatient Diabetes Program Recommendations:    Note that patient has been on insulin pump in the past with U500 insulin.  It appears that cost has been an issue.  He is likely resistant to insulin based on history of being on U500 insulin.  May consider further increase of Lantus to 25 units bid and add Novolog meal coverage 6 units tid with meals.  Will speak with patient today regarding medications and to make sure he has what he needs at home to manage his diabetes.   Thanks,  Adah Perl, RN, BC-ADM Inpatient Diabetes Coordinator Pager [phone] (8a-5p)  1430:  Spoke with patient regarding home diabetes medications.  He states that he is trying to get another insulin pump b/c it works best for his blood sugars.  At home he is drawing up U500 insulin to the 24 unit mark (which is the equivalent of 120 units of U100 regular insulin). He states that blood sugars vary between 40-400 mg/dL with out consistency. Asked if he has ever seen an endocrinologist and he states "No".  May benefit from f/u with endocrinologist as well for DM control.  He is concerned  about blood pressure dropping.

## 2018-08-14 NOTE — Consult Note (Signed)
   [facility] CM Inpatient Consult   08/14/2018  Reginald Duncan September 07, 1951 [id number]  Alerted by [place] of patient's admission. Patient has been at different hospitals in the past few weeks.  Patient with [facility]. Patient is currently active with [facility] for chronic disease management services.  Patient has been engaged by a [place] [facility] Pharmacist for medication assistance with device and [facility] Social worker having difficulty contact ing patient noted in assessment patient has difficulty with his cell phone.   Met with the patient at the bedside and the patient endorses ongoing follow up with [facility] and explained to the patient that it's [place] and he states that one where [name] is contacting me and some others.  Explained that it the right people he just had the name incorrect.  He states, "Oh yes for sure the pharmacist is suppose to et me know about the [employer] deal."  Explained that social worker had difficulty getting up with him.  He states, "well my phone is fixed but if they try to call me now it's dead cause I didn't bring my charger". Explained that we'll have the staff follow up after he transitions home [refusing SNF].  He states that he feels that he can manage at home as long as he doesn't stand straight up.  Encouraged him to reconsider skilled nursing to get his strength back up.  He states, "I am pretty weak and tired right now but I think I'd be alright getting back home once I get my blood pressure straightened out.  [facility] Will continue to follow for needs.  Natividad Brood, RN BSN [facility] Liaison  [phone] business mobile phone Toll free office [phone]     Our community based plan of care has focused on disease management and community resource support.  Patient will receive a post hospital call and will be evaluated for monthly home visits for assessments and  disease process education.  Came by to make Inpatient Case Manager aware that [facility] following but Steffanie Dunn was not available currently  . Of note, [facility] services does not replace or interfere with any services that are needed or arranged by inpatient case management or social work.  For additional questions or referrals please contact:  Natividad Brood, RN BSN [facility] Liaison  [phone] business mobile phone Toll free office [phone]

## 2018-08-14 NOTE — Progress Notes (Signed)
Patient arrived from [facility] by wheelchair to [facility]. Patient assessed and placed on telemetry. CHG bath given. Bed placed in lowest position and call bell within reach. Patient oriented to room. Patient belongings checked and missing PTA meds. Previous unit called and directed to pharmacy to track down meds and patient keys. Keys located and brought to patient room. Will continue to monitor. Lajoyce Corners, RN

## 2018-08-14 NOTE — Social Work (Signed)
Pt has declined SNF placement.  CSW signing off. Please consult if any additional needs arise.  [place], [place] Work [phone]

## 2018-08-15 ENCOUNTER — Ambulatory Visit: Payer: Self-pay | Admitting: *Deleted

## 2018-08-15 DIAGNOSIS — N183 Chronic kidney disease, stage 3 (moderate): Secondary | ICD-10-CM

## 2018-08-15 LAB — MAGNESIUM: Magnesium: 1.7 mg/dL (ref 1.7–2.4)

## 2018-08-15 LAB — BASIC METABOLIC PANEL
Anion gap: 7 (ref 5–15)
BUN: 23 mg/dL (ref 8–23)
CHLORIDE: 102 mmol/L (ref 98–111)
CO2: 29 mmol/L (ref 22–32)
CREATININE: 1.57 mg/dL — AB (ref 0.61–1.24)
Calcium: 9.2 mg/dL (ref 8.9–10.3)
GFR calc non Af Amer: 44 mL/min — ABNORMAL LOW (ref >60)
GFR, EST AFRICAN AMERICAN: 51 mL/min — AB (ref >60)
Glucose, Bld: 276 mg/dL — ABNORMAL HIGH (ref 70–99)
POTASSIUM: 5.5 mmol/L — AB (ref 3.5–5.1)
Sodium: 138 mmol/L (ref 135–145)

## 2018-08-15 LAB — GLUCOSE, CAPILLARY
GLUCOSE-CAPILLARY: 233 mg/dL — AB (ref 70–99)
GLUCOSE-CAPILLARY: 256 mg/dL — AB (ref 70–99)
Glucose-Capillary: 304 mg/dL — ABNORMAL HIGH (ref 70–99)

## 2018-08-15 MED ORDER — INSULIN GLARGINE 100 UNIT/ML ~~LOC~~ SOLN
25.0000 [IU] | Freq: Two times a day (BID) | SUBCUTANEOUS | Status: DC
  Administered 2018-08-15: 25 [IU] via SUBCUTANEOUS
  Filled 2018-08-15 (×2): qty 0.25

## 2018-08-15 MED ORDER — NEBIVOLOL HCL 2.5 MG PO TABS: 2.5000 mg | ORAL_TABLET | Freq: Every day | ORAL | 0 refills | Status: DC
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
## 2018-08-15 NOTE — Discharge Instructions (Signed)
Make and keep appointment with your primary care MD to have an evaluation for sleep apnea.You need a CPAP/BiPAP  Follow with Primary MD Darrol Jump, PA-C in 7 days   Get CBC, CMP,  checked  by Primary MD next visit.    Activity: As tolerated with Full fall precautions use walker/cane & assistance as needed   Disposition Home    Diet: Heart Healthy , carb modified , with feeding assistance and aspiration precautions.  For Heart failure patients - Check your Weight same time everyday, if you gain over 2 pounds, or you develop in leg swelling, experience more shortness of breath or chest pain, call your Primary MD immediately. Follow Cardiac Low Salt Diet and 1.5 lit/day fluid restriction.   On your next visit with your primary care physician please Get Medicines reviewed and adjusted.   Please request your Prim.MD to go over all Hospital Tests and Procedure/Radiological results at the follow up, please get all Hospital records sent to your Prim MD by signing hospital release before you go home.   If you experience worsening of your admission symptoms, develop shortness of breath, life threatening emergency, suicidal or homicidal thoughts you must seek medical attention immediately by calling 911 or calling your MD immediately  if symptoms less severe.  You Must read complete instructions/literature along with all the possible adverse reactions/side effects for all the Medicines you take and that have been prescribed to you. Take any new Medicines after you have completely understood and accpet all the possible adverse reactions/side effects.   Do not drive, operating heavy machinery, perform activities at heights, swimming or participation in water activities or provide baby sitting services if your were admitted for syncope or siezures until you have seen by Primary MD or a Neurologist and advised to do so again.  Do not drive when taking Pain medications.    Do not take more than  prescribed Pain, Sleep and Anxiety Medications  Special Instructions: If you have smoked or chewed Tobacco  in the last 2 yrs please stop smoking, stop any regular Alcohol  and or any Recreational drug use.  Wear Seat belts while driving.   Please note  You were cared for by a hospitalist during your hospital stay. If you have any questions about your discharge medications or the care you received while you were in the hospital after you are discharged, you can call the unit and asked to speak with the hospitalist on call if the hospitalist that took care of you is not available. Once you are discharged, your primary care physician will handle any further medical issues. Please note that NO REFILLS for any discharge medications will be authorized once you are discharged, as it is imperative that you return to your primary care physician (or establish a relationship with a primary care physician if you do not have one) for your aftercare needs so that they can reassess your need for medications and monitor your lab values.

## 2018-08-15 NOTE — Care Management Note (Signed)
Case Management Note Initial CM note completed by Midge Minium RN, BSN, NCM-BC, ACM-RN [phone] 08/13/2018, 4:11 PM   Patient Details  Name: Reginald Duncan MRN: [id number] Date of Birth: [birth year]  Subjective/Objective:  67yo male presented from home with near syncope and dizzy spells.               Action/Plan: CM attempted x 2 to discuss transitional needs with patient. Per primary nurse, patient is off the floor for vascular testing. CSW met with patient to discuss PT recommendations for ST SNF, with patient declining. Patient indicted he was being followed by [facility]. CM will continue to follow.   Expected Discharge Date:  08/15/18               Expected Discharge Plan:  [place]  In-House Referral:  Clinical Social Work  Discharge planning Services  CM Consult  Post Acute Care Choice:  Home Health Choice offered to:  Patient  DME Arranged:  N/A DME Agency:  NA  HH Arranged:  RN, PT, OT HH Agency:  [place]  Status of Service:  Completed, signed off  If discussed at [place] of Stay Meetings, dates discussed:    Discharge Disposition: home/home health.    Additional Comments:  08/15/18- 1400- Kayelynn Abdou RN, CM- pt for transition home today- spoke with pt at bedside regarding [facility] services- per pt he has been set up by his PCP with [facility]- call made to them at [phone]- spoke with Tye [place]- who confirmed that they do have referral for [facility] services but have not been able to see pt for initial assessment yet- however they do not provide skilled services - pt will need to select a provider for [facility] skilled needs- for RN/PT/OT- spoke with pt again to offer choice- pt looked at list and selected [facility]. Per pt he states he has walker and shower chair at home. Address, phone # and PCP confirmed in epic. Call made to [facility] with [facility] for referral of [facility] needs-  Referral has been accepted.   08/13/18 @  1644-Natalie Gay RNCM-CM met with patient to discuss transitional needs and PT recommendations. Patient has declined ST SNF, and prefers to transition home with [facility] services by [facility], with assistance from his (2) neighbors. Patient reported a HHRN and HHPT were ordered by his PCP and was scheduled to start services on 08/12/18, but he was hospitalized. Patient was followed by [facility] PTA. Patient confirmed PCP as: Dr. Darrol Jump; [facility] choice[facility], [place]. Patient verbalized ambulating with a RW, and stated his brother would provide transportation home. CM will continue to follow.    Marvetta Gibbons Anderson, RN 08/15/2018, 2:23 PM [phone] 4E Transitions of Care RN Case Manager

## 2018-08-16 ENCOUNTER — Ambulatory Visit: Payer: Self-pay | Admitting: Pharmacist

## 2018-08-16 ENCOUNTER — Other Ambulatory Visit: Payer: Self-pay

## 2018-08-16 ENCOUNTER — Other Ambulatory Visit: Payer: Self-pay | Admitting: *Deleted

## 2018-08-16 ENCOUNTER — Encounter: Payer: Self-pay | Admitting: *Deleted

## 2018-08-16 NOTE — Patient Outreach (Signed)
Transition of care:  Placed call to patient who reports he was going to see cardiologist on Monday and passed out. Then reports being in the hospital. Reports today that he is having "tailbone pain" and that he is tired.  Reports he did not get his medications back when leaving the hospital and needs to return to the hospital today to get his medications. Reports he knows how to take his medications. Reports CBG today of 110 and has his insulin in the refrigerator.  PLAN: Encouraged patient to call MD to make a follow up appointment. Reviewed fall precautions and discharge instructions. Patient voiced understanding.  Will follow up within 1 week.  Tomasa Rand, RN, BSN, CEN [facility] [employer] [phone]

## 2018-08-16 NOTE — Patient Outreach (Signed)
[facility]) Care Management  08/16/2018  FINTAN GRATER [birth year] [phone]   CSW attempted to reach pt again by phone on 08/15/2018 and again today. Voice mail is full. CSW will mail pt an unsuccessful outreach letter and try again in 3-5 days.    Eduard Clos, MSW, [place] Worker  [place] [phone]

## 2018-08-20 ENCOUNTER — Ambulatory Visit: Payer: Medicare HMO | Admitting: *Deleted

## 2018-08-22 ENCOUNTER — Ambulatory Visit: Payer: Self-pay

## 2018-08-22 ENCOUNTER — Other Ambulatory Visit: Payer: Self-pay

## 2018-08-22 ENCOUNTER — Other Ambulatory Visit: Payer: Self-pay | Admitting: *Deleted

## 2018-08-22 NOTE — Patient Outreach (Signed)
Transition of care:  Placed call to patient who answered and reports that he is doing better. Reports that he is drinking more water and has not had any dizzy or passing out spells.   Patient reports that he has made a follow up with primary MD for next Tuesday 08/27/2018. Reports that he will drive himself. Reports that he is taking his medications as prescribed. Reports his average CBG is 200.    Reports that he has a new phone and now can accept phone calls without problems.   PLAN: will follow in 1 week. Reviewed pending outreach attempts from Education officer, museum and pharmacy on 08/26/2018. Encouraged patient to answer phone.  Tomasa Rand, RN, BSN, CEN [facility] [employer] [phone]

## 2018-08-26 ENCOUNTER — Ambulatory Visit: Payer: Self-pay | Admitting: Pharmacist

## 2018-08-26 ENCOUNTER — Other Ambulatory Visit: Payer: Self-pay | Admitting: *Deleted

## 2018-08-26 ENCOUNTER — Other Ambulatory Visit: Payer: Self-pay | Admitting: Pharmacist

## 2018-08-26 NOTE — Patient Outreach (Signed)
[facility]) Care Management  08/26/2018  Reginald Duncan 10-17-1951 [id number]   Patient was called regarding post discharge medication review and to follow up on medication assistance. Unfortunately, he did not answer his phone. HIPAA compliant message could NOT be left on the patient's voicemail because it was full.   Plan: Call patient back in 5-7 business days.   Elayne Guerin, PharmD, [place] Clinical Pharmacist [phone]

## 2018-08-27 ENCOUNTER — Other Ambulatory Visit: Payer: Self-pay | Admitting: *Deleted

## 2018-08-27 DIAGNOSIS — I4510 Unspecified right bundle-branch block: Secondary | ICD-10-CM | POA: Diagnosis not present

## 2018-08-27 DIAGNOSIS — I2699 Other pulmonary embolism without acute cor pulmonale: Secondary | ICD-10-CM | POA: Diagnosis not present

## 2018-08-27 DIAGNOSIS — I499 Cardiac arrhythmia, unspecified: Secondary | ICD-10-CM | POA: Diagnosis not present

## 2018-08-27 DIAGNOSIS — Z8673 Personal history of transient ischemic attack (TIA), and cerebral infarction without residual deficits: Secondary | ICD-10-CM | POA: Diagnosis not present

## 2018-08-27 DIAGNOSIS — E119 Type 2 diabetes mellitus without complications: Secondary | ICD-10-CM | POA: Diagnosis not present

## 2018-08-27 DIAGNOSIS — I4891 Unspecified atrial fibrillation: Secondary | ICD-10-CM | POA: Diagnosis not present

## 2018-08-27 DIAGNOSIS — R42 Dizziness and giddiness: Secondary | ICD-10-CM | POA: Diagnosis not present

## 2018-08-27 DIAGNOSIS — J449 Chronic obstructive pulmonary disease, unspecified: Secondary | ICD-10-CM | POA: Diagnosis not present

## 2018-08-27 DIAGNOSIS — R079 Chest pain, unspecified: Secondary | ICD-10-CM | POA: Diagnosis not present

## 2018-08-27 DIAGNOSIS — I472 Ventricular tachycardia: Secondary | ICD-10-CM | POA: Diagnosis not present

## 2018-08-27 DIAGNOSIS — R55 Syncope and collapse: Secondary | ICD-10-CM | POA: Diagnosis not present

## 2018-08-27 DIAGNOSIS — I2510 Atherosclerotic heart disease of native coronary artery without angina pectoris: Secondary | ICD-10-CM | POA: Diagnosis not present

## 2018-08-27 DIAGNOSIS — R9431 Abnormal electrocardiogram [ECG] [EKG]: Secondary | ICD-10-CM | POA: Diagnosis not present

## 2018-08-27 DIAGNOSIS — F17210 Nicotine dependence, cigarettes, uncomplicated: Secondary | ICD-10-CM | POA: Diagnosis not present

## 2018-08-27 NOTE — Patient Outreach (Signed)
[facility]) Care Management  08/27/2018  Reginald Duncan May 11, 1951 [id number]   CSW attempted to reach patient again on 08/26/2018 without success. CSW left a HIPPA compliant voice message and will plan closure due to inability to reach after multiple attempts and letter outreach.  CSW will advise [facility] team and PCP.    Eduard Clos, MSW, [place] Worker  [place] [phone]

## 2018-08-29 ENCOUNTER — Other Ambulatory Visit: Payer: Self-pay

## 2018-08-29 NOTE — Patient Outreach (Signed)
Transition of care:  Placed call to patient. No answer. Left a message requesting a call back.  PLAN: will await a call back and will send outreach letter.  Tomasa Rand, RN, BSN, CEN [facility] [employer] [phone]

## 2018-09-01 ENCOUNTER — Other Ambulatory Visit: Payer: Self-pay | Admitting: Internal Medicine

## 2018-09-02 ENCOUNTER — Other Ambulatory Visit: Payer: Self-pay

## 2018-09-02 ENCOUNTER — Ambulatory Visit: Payer: 59 | Admitting: Internal Medicine

## 2018-09-02 NOTE — Patient Outreach (Signed)
Transition of care: 2nd attempt to reach patient unsuccessful. Left a message requesting a call back.  PLAN:will call back in 3 days. Letter already mailed.   Tomasa Rand, RN, BSN, CEN [facility] [employer] [phone]

## 2018-09-03 ENCOUNTER — Ambulatory Visit: Payer: Self-pay | Admitting: Pharmacist

## 2018-09-03 ENCOUNTER — Other Ambulatory Visit: Payer: Self-pay | Admitting: Pharmacist

## 2018-09-03 NOTE — Patient Outreach (Addendum)
[facility]) Care Management  09/03/2018  Reginald Duncan [birth year] [id number]   Patient was called to follow up on insulin pump. Unfortunately, he did not answer the phone. HIPAA compliant message was left on his voicemail.  Today's call was call #3. Patient has not been able to be reached by several [facility] Caregivers. Tomasa Rand sent the patient an unsuccessful contact letter. Social Worker, Eduard Clos, has already closed the patient.  Plan: Since the patient was given contact information about obtaining his insulin pump with some help from [employer] and multiple calls have been made, the patient's case will be closed due to inability to maintain contact.   His pharmacy case can be reopened at any time in the future.   Elayne Guerin, PharmD, [place] Clinical Pharmacist [phone]  ----------------ADDENDUM---------------------------------------  Patient called me back. HIPAA identifiers were obtained. Patient said he had not had time to call [employer] about his insulin pump.     ASSESSMENT: Date Discharged from Hospital: 08/15/2018 Date Medication Reconciliation Performed: 09/03/2018    Medications Discontinued at Discharge: (Delete if not applicable)  STOP taking: citalopram 40 MG tablet (CELEXA) Quetiapine 300 MG tablet (SEROQUEL) WELLBUTRIN 100 MG tablet  New Medications at Discharge:  nebivolol (BYSTOLIC)  Patient was recently discharged from hospital and all medications have been reviewed   Drugs sorted by system:  Neurologic/Psychologic: Alprazolam, Quetiapine, pregabalin, nortriptyline, Citalopram, bupropion   Cardiovascular: Aspirin, Clopidogrel, Vascepa, Rosuvastatin, Nitroglycerin, Nebivolol (not taking),   Gastrointestinal: Pantoprazole  Endocrine: Humulin R,   Pain: Hydrocodone/APAP     Gaps in therapy:  Nebivolol was prescribed at discharge. Patient has not had this filled and said he did not have it in his  possession.  Omitted medications: Citalopram, Quetiapine and Bupropion were discontinued at discharge. Patient said Dr. Darrol Jump told him to resume taking these medications.  Drug-Drug Interaction: Alprazolam/Hydrocodone/APAP,Nortriptyline, Quetiapine, Pregabalin,--combination can cause CNS depression, confusion, increase fall risk and risk of unintentional overdose. (Patient does not report having Narcan on hand).   PLAN: Gave patient the financial assistance phone number at [employer]: [phone] and select option 4.  Route note to PCP  Follow up with patient on calling [employer].  Elayne Guerin, PharmD, [place] Clinical Pharmacist [phone]

## 2018-09-05 ENCOUNTER — Other Ambulatory Visit: Payer: Self-pay

## 2018-09-05 NOTE — Patient Outreach (Signed)
Transition of care:  Placed call to patient and left a message. Return call from patient, who reports that he thinks he has the flu. Reports sore throat and not feeling well. Has not been to MD office for symptoms. Reports went to MD office 2 weeks ago and passed out and was sent to the emergency department for hypotension. Patient reports CBG has been low due to lack of appetite. Denies fever.   PLAN: reviewed with patient to go see MD for worsening symptoms or weakness. Reviewed with patient need to eat and drink well and monitor CBG more often when not eating well. Reviewed fall precautions.  Will follow up in 1 week.  Tomasa Rand, RN, BSN, CEN [facility] [employer] [phone]

## 2018-09-06 ENCOUNTER — Ambulatory Visit: Payer: Self-pay

## 2018-09-09 ENCOUNTER — Other Ambulatory Visit: Payer: Self-pay | Admitting: Physician Assistant

## 2018-09-09 ENCOUNTER — Other Ambulatory Visit: Payer: Self-pay | Admitting: Pharmacist

## 2018-09-09 NOTE — Patient Outreach (Addendum)
[facility]) Care Management  09/09/2018  PIERO MUSTARD 1951/02/21 [id number]   Patient was called to follow up on medication assistance with his insulin pump.  HIPAA identifiers were obtained. Patient said he called the [employer] number as I instructed him to do and was told they could not help him.  He cannot afford the 20% copay for an Insulin Pump as would be required by his insurance.  Patient reported that he fell today at [facility]. He said 911 was dialed and the patient was attended to by EMS. He was not transported the the hospital.  He reported this was his 13th fall this year. He said he was not hurt but was told by his provider that his falls were caused by hypotension.    Patient was advised to call his provider if he has complications. He has an appointment with cardiology (Dr. Harrington Challenger) on 09/17/18.  Medication Review Findings: Patient reported he ran out of Aspirin.  He was offered a conference call to [facility] to order an OTC catalog and potentially place an order but he refused.  Levothyroxine-patient reported he would be going to the pharmacy today to place an order and pick up his refill.   Plan: Close patient's pharmacy case as he has exhausted all know resources for insulin pump replacement assistance and he declined [employer] OTC benefit assistance.  [place] Nurse is still active with the patient. A note will be sent to her letting her know about pharmacy case closure.  Patient has my contact information and communicated understanding on how to reach me if he has medication related questions in the future.  Elayne Guerin, PharmD, [place] Clinical Pharmacist [phone]

## 2018-09-10 DIAGNOSIS — I10 Essential (primary) hypertension: Secondary | ICD-10-CM | POA: Diagnosis not present

## 2018-09-10 DIAGNOSIS — R0902 Hypoxemia: Secondary | ICD-10-CM | POA: Diagnosis not present

## 2018-09-10 DIAGNOSIS — N184 Chronic kidney disease, stage 4 (severe): Secondary | ICD-10-CM | POA: Diagnosis not present

## 2018-09-10 DIAGNOSIS — E119 Type 2 diabetes mellitus without complications: Secondary | ICD-10-CM | POA: Diagnosis not present

## 2018-09-10 DIAGNOSIS — J9620 Acute and chronic respiratory failure, unspecified whether with hypoxia or hypercapnia: Secondary | ICD-10-CM | POA: Diagnosis not present

## 2018-09-10 DIAGNOSIS — J441 Chronic obstructive pulmonary disease with (acute) exacerbation: Secondary | ICD-10-CM | POA: Diagnosis not present

## 2018-09-10 DIAGNOSIS — R0600 Dyspnea, unspecified: Secondary | ICD-10-CM | POA: Diagnosis not present

## 2018-09-10 DIAGNOSIS — J80 Acute respiratory distress syndrome: Secondary | ICD-10-CM | POA: Diagnosis not present

## 2018-09-10 DIAGNOSIS — T380X5A Adverse effect of glucocorticoids and synthetic analogues, initial encounter: Secondary | ICD-10-CM | POA: Diagnosis not present

## 2018-09-10 DIAGNOSIS — E1122 Type 2 diabetes mellitus with diabetic chronic kidney disease: Secondary | ICD-10-CM | POA: Diagnosis not present

## 2018-09-10 DIAGNOSIS — R079 Chest pain, unspecified: Secondary | ICD-10-CM | POA: Diagnosis not present

## 2018-09-10 DIAGNOSIS — R069 Unspecified abnormalities of breathing: Secondary | ICD-10-CM | POA: Diagnosis not present

## 2018-09-10 DIAGNOSIS — Z87891 Personal history of nicotine dependence: Secondary | ICD-10-CM | POA: Diagnosis not present

## 2018-09-10 DIAGNOSIS — R0602 Shortness of breath: Secondary | ICD-10-CM | POA: Diagnosis not present

## 2018-09-10 DIAGNOSIS — I249 Acute ischemic heart disease, unspecified: Secondary | ICD-10-CM | POA: Diagnosis not present

## 2018-09-10 DIAGNOSIS — I129 Hypertensive chronic kidney disease with stage 1 through stage 4 chronic kidney disease, or unspecified chronic kidney disease: Secondary | ICD-10-CM | POA: Diagnosis not present

## 2018-09-10 DIAGNOSIS — Z9119 Patient's noncompliance with other medical treatment and regimen: Secondary | ICD-10-CM | POA: Diagnosis not present

## 2018-09-10 DIAGNOSIS — I4510 Unspecified right bundle-branch block: Secondary | ICD-10-CM | POA: Diagnosis not present

## 2018-09-10 DIAGNOSIS — J449 Chronic obstructive pulmonary disease, unspecified: Secondary | ICD-10-CM | POA: Diagnosis not present

## 2018-09-11 ENCOUNTER — Other Ambulatory Visit: Payer: Self-pay

## 2018-09-11 DIAGNOSIS — R0600 Dyspnea, unspecified: Secondary | ICD-10-CM | POA: Diagnosis not present

## 2018-09-11 NOTE — Patient Outreach (Signed)
Transition of care: Placed call to patient with no answer.  Unable to leave a message.  PLAN: will call back. Tomasa Rand, RN, BSN, CEN [facility] [employer] [phone]

## 2018-09-12 ENCOUNTER — Other Ambulatory Visit: Payer: Self-pay

## 2018-09-12 NOTE — Patient Outreach (Signed)
Transition of care:  Placed call to patient for second day in a row. No answer. Left a message requesting a call back.  PLAN: will Corporate treasurer.  Tomasa Rand, RN, BSN, CEN [facility] [employer] [phone]
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
## 2018-09-16 ENCOUNTER — Other Ambulatory Visit: Payer: Self-pay

## 2018-09-16 NOTE — Patient Outreach (Signed)
Transition of care:  Placed call to patient who reports that he got out of the hospital yesterday for " passing out spells and low blood pressure"  Patient reports that he does not have follow up planned with primary MD yet. States he has follow up planned with cardiology tomorrow. Reports that he will drive himself.  Reviewed concern with patient driving.  Also reviewed concern for current level of care and patient refuses to consider a different level of care. Patient reports that he is on home oxygen at this time. Reports CBG of 150 today. States that he has all his medications and is taking them as prescribed.   PLAN: will restart transition of care.  (1) encouraged patient not to drive due to safety concerns. (2) reviewed concerns for current level of care.  (3) encouraged patient to make a follow up appointment with primary MD. (4) encouraged hydration.   Will follow up in 1 week.  Tomasa Rand, RN, BSN, CEN [facility] [employer] [phone]

## 2018-09-17 ENCOUNTER — Ambulatory Visit: Payer: Medicare HMO | Admitting: Internal Medicine

## 2018-09-17 ENCOUNTER — Telehealth: Payer: Self-pay | Admitting: Internal Medicine

## 2018-09-17 NOTE — Telephone Encounter (Signed)
New Messag:    Patient calling because he missed his appt today and stated that the next appt is ing Febuary Patient states that the appt is to far away and he needs a sooner appt.

## 2018-09-17 NOTE — Telephone Encounter (Signed)
Message sent to scheduler to add to wait list or offer sooner with APP.

## 2018-09-23 ENCOUNTER — Other Ambulatory Visit: Payer: Self-pay

## 2018-09-23 NOTE — Patient Outreach (Signed)
Transition of care:  Placed call to patient for transition of care. No answer. Left a message requesting a call back.  PLAN: will outreach again in 3 days.  Tomasa Rand, RN, BSN, CEN [facility] [employer] [phone]

## 2018-09-24 ENCOUNTER — Other Ambulatory Visit: Payer: Self-pay

## 2018-09-24 NOTE — Patient Outreach (Signed)
Transition of care:  Placed call to patient who reports that he is feeling well. Reports no recent syncopal episodes. Denies any falls. Reports CBG of 180's.  Denies any new problems or concerns. Reviewed pending appointments with patient.  PLAN: encouraged patient to observe fall precautions. Reviewed need for follow up with primary MD.  Encouraged patient to make an appointment. Will plan follow up in 1 week.  Tomasa Rand, RN, BSN, CEN [facility] [employer] [phone]

## 2018-09-26 ENCOUNTER — Ambulatory Visit: Payer: Self-pay

## 2018-09-26 DIAGNOSIS — Z794 Long term (current) use of insulin: Secondary | ICD-10-CM | POA: Diagnosis not present

## 2018-09-26 DIAGNOSIS — E1122 Type 2 diabetes mellitus with diabetic chronic kidney disease: Secondary | ICD-10-CM | POA: Diagnosis not present

## 2018-09-26 DIAGNOSIS — I2510 Atherosclerotic heart disease of native coronary artery without angina pectoris: Secondary | ICD-10-CM | POA: Diagnosis not present

## 2018-09-26 DIAGNOSIS — N2581 Secondary hyperparathyroidism of renal origin: Secondary | ICD-10-CM | POA: Diagnosis not present

## 2018-09-26 DIAGNOSIS — F329 Major depressive disorder, single episode, unspecified: Secondary | ICD-10-CM | POA: Diagnosis not present

## 2018-09-26 DIAGNOSIS — N183 Chronic kidney disease, stage 3 (moderate): Secondary | ICD-10-CM | POA: Diagnosis not present

## 2018-09-26 DIAGNOSIS — N179 Acute kidney failure, unspecified: Secondary | ICD-10-CM | POA: Diagnosis not present

## 2018-09-26 DIAGNOSIS — E785 Hyperlipidemia, unspecified: Secondary | ICD-10-CM | POA: Diagnosis not present

## 2018-09-26 DIAGNOSIS — I129 Hypertensive chronic kidney disease with stage 1 through stage 4 chronic kidney disease, or unspecified chronic kidney disease: Secondary | ICD-10-CM | POA: Diagnosis not present

## 2018-10-01 ENCOUNTER — Other Ambulatory Visit: Payer: Self-pay

## 2018-10-01 NOTE — Patient Outreach (Signed)
Transition of care:  Placed call to patient. No answer. Left a message requesting a call back.  Tomasa Rand, RN, BSN, CEN [facility] [employer] [phone]

## 2018-10-04 DIAGNOSIS — E1159 Type 2 diabetes mellitus with other circulatory complications: Secondary | ICD-10-CM | POA: Diagnosis not present

## 2018-10-11 ENCOUNTER — Ambulatory Visit (INDEPENDENT_AMBULATORY_CARE_PROVIDER_SITE_OTHER): Payer: Medicare HMO | Admitting: Physician Assistant

## 2018-10-11 ENCOUNTER — Encounter: Payer: Self-pay | Admitting: Physician Assistant

## 2018-10-11 VITALS — BP 110/70 | HR 95 | Ht 73.0 in | Wt 298.1 lb

## 2018-10-11 DIAGNOSIS — E785 Hyperlipidemia, unspecified: Secondary | ICD-10-CM | POA: Diagnosis not present

## 2018-10-11 DIAGNOSIS — I2510 Atherosclerotic heart disease of native coronary artery without angina pectoris: Secondary | ICD-10-CM | POA: Diagnosis not present

## 2018-10-11 DIAGNOSIS — N183 Chronic kidney disease, stage 3 unspecified: Secondary | ICD-10-CM

## 2018-10-11 DIAGNOSIS — G4733 Obstructive sleep apnea (adult) (pediatric): Secondary | ICD-10-CM

## 2018-10-11 DIAGNOSIS — J449 Chronic obstructive pulmonary disease, unspecified: Secondary | ICD-10-CM | POA: Diagnosis not present

## 2018-10-11 DIAGNOSIS — I10 Essential (primary) hypertension: Secondary | ICD-10-CM | POA: Diagnosis not present
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
## 2018-10-11 NOTE — Patient Instructions (Signed)
Medication Instructions:  Your physician recommends that you continue on your current medications as directed. Please refer to the Current Medication list given to you today.  If you need a refill on your cardiac medications before your next appointment, please call your pharmacy.   Lab work: NONE If you have labs (blood work) drawn today and your tests are completely normal, you will receive your results only by: [name] MyChart Message (if you have MyChart) OR . A paper copy in the mail If you have any lab test that is abnormal or we need to change your treatment, we will call you to review the results.  Testing/Procedures: NONE  Follow-Up: At [facility], you and your health needs are our priority.  As part of our continuing mission to provide you with exceptional heart care, we have created designated Provider Care Teams.  These Care Teams include your primary Cardiologist (physician) and Advanced Practice Providers (APPs -  Physician Assistants and Nurse Practitioners) who all work together to provide you with the care you need, when you need it. You will need a follow up appointment in:  6 months.  Please call our office 2 months in advance to schedule this appointment.  You may see Dorris Carnes, MD or one of the following Advanced Practice Providers on your designated Care Team: Richardson Dopp, PA-C [place], [place] . Daune Perch, NP  Any Other Special Instructions Will Be Listed Below (If Applicable).

## 2018-11-04 ENCOUNTER — Other Ambulatory Visit: Payer: Self-pay

## 2018-11-04 NOTE — Patient Outreach (Signed)
Telephone assessment:  Placed call to patient after no response from last outreach effort. Patient answered the phone and reports that he is doing better. Reports no recent fall or passing out spells. Reports that he has all his medications and is taking them as prescribed, Reports CBG running 200. States last A1c of 7.  Denies any new problems or needs at this time.  PLAN: discussed case closure with patient and he is in agreement. Patient will call me in the future for needs. Will send case closure to MD and patient.  Tomasa Rand, RN, BSN, CEN [facility] [employer] [phone]

## 2018-11-22 DIAGNOSIS — E119 Type 2 diabetes mellitus without complications: Secondary | ICD-10-CM

## 2018-11-22 DIAGNOSIS — E785 Hyperlipidemia, unspecified: Secondary | ICD-10-CM

## 2018-11-22 DIAGNOSIS — I10 Essential (primary) hypertension: Secondary | ICD-10-CM

## 2018-11-22 DIAGNOSIS — J209 Acute bronchitis, unspecified: Secondary | ICD-10-CM

## 2018-11-22 DIAGNOSIS — J441 Chronic obstructive pulmonary disease with (acute) exacerbation: Secondary | ICD-10-CM

## 2018-11-23 DIAGNOSIS — J440 Chronic obstructive pulmonary disease with acute lower respiratory infection: Secondary | ICD-10-CM | POA: Diagnosis not present

## 2018-11-23 DIAGNOSIS — J209 Acute bronchitis, unspecified: Secondary | ICD-10-CM | POA: Diagnosis not present

## 2018-11-23 DIAGNOSIS — E119 Type 2 diabetes mellitus without complications: Secondary | ICD-10-CM | POA: Diagnosis not present

## 2018-11-23 DIAGNOSIS — J9611 Chronic respiratory failure with hypoxia: Secondary | ICD-10-CM | POA: Diagnosis not present

## 2018-11-23 DIAGNOSIS — I10 Essential (primary) hypertension: Secondary | ICD-10-CM | POA: Diagnosis not present

## 2018-11-23 DIAGNOSIS — Z6838 Body mass index (BMI) 38.0-38.9, adult: Secondary | ICD-10-CM | POA: Diagnosis not present

## 2018-11-23 DIAGNOSIS — E039 Hypothyroidism, unspecified: Secondary | ICD-10-CM | POA: Diagnosis not present

## 2018-11-23 DIAGNOSIS — J441 Chronic obstructive pulmonary disease with (acute) exacerbation: Secondary | ICD-10-CM | POA: Diagnosis not present

## 2018-11-23 DIAGNOSIS — I2510 Atherosclerotic heart disease of native coronary artery without angina pectoris: Secondary | ICD-10-CM | POA: Diagnosis not present

## 2018-11-23 DIAGNOSIS — J9621 Acute and chronic respiratory failure with hypoxia: Secondary | ICD-10-CM | POA: Diagnosis not present

## 2018-11-23 DIAGNOSIS — E785 Hyperlipidemia, unspecified: Secondary | ICD-10-CM | POA: Diagnosis not present

## 2018-11-23 DIAGNOSIS — J449 Chronic obstructive pulmonary disease, unspecified: Secondary | ICD-10-CM | POA: Diagnosis not present

## 2018-11-23 DIAGNOSIS — I509 Heart failure, unspecified: Secondary | ICD-10-CM | POA: Diagnosis not present

## 2018-11-23 DIAGNOSIS — E662 Morbid (severe) obesity with alveolar hypoventilation: Secondary | ICD-10-CM | POA: Diagnosis not present

## 2018-11-23 DIAGNOSIS — I130 Hypertensive heart and chronic kidney disease with heart failure and stage 1 through stage 4 chronic kidney disease, or unspecified chronic kidney disease: Secondary | ICD-10-CM | POA: Diagnosis not present

## 2018-11-23 DIAGNOSIS — R0602 Shortness of breath: Secondary | ICD-10-CM | POA: Diagnosis not present

## 2018-11-24 DIAGNOSIS — R0602 Shortness of breath: Secondary | ICD-10-CM

## 2018-11-28 DIAGNOSIS — J440 Chronic obstructive pulmonary disease with acute lower respiratory infection: Secondary | ICD-10-CM | POA: Diagnosis not present

## 2018-11-28 DIAGNOSIS — J441 Chronic obstructive pulmonary disease with (acute) exacerbation: Secondary | ICD-10-CM | POA: Diagnosis not present

## 2018-11-28 DIAGNOSIS — F329 Major depressive disorder, single episode, unspecified: Secondary | ICD-10-CM | POA: Diagnosis not present

## 2018-11-28 DIAGNOSIS — E1165 Type 2 diabetes mellitus with hyperglycemia: Secondary | ICD-10-CM | POA: Diagnosis not present

## 2018-11-28 DIAGNOSIS — I509 Heart failure, unspecified: Secondary | ICD-10-CM | POA: Diagnosis not present

## 2018-11-28 DIAGNOSIS — E1122 Type 2 diabetes mellitus with diabetic chronic kidney disease: Secondary | ICD-10-CM | POA: Diagnosis not present

## 2018-11-28 DIAGNOSIS — I130 Hypertensive heart and chronic kidney disease with heart failure and stage 1 through stage 4 chronic kidney disease, or unspecified chronic kidney disease: Secondary | ICD-10-CM | POA: Diagnosis not present

## 2018-11-28 DIAGNOSIS — J209 Acute bronchitis, unspecified: Secondary | ICD-10-CM | POA: Diagnosis not present

## 2018-11-28 DIAGNOSIS — N184 Chronic kidney disease, stage 4 (severe): Secondary | ICD-10-CM | POA: Diagnosis not present

## 2018-11-29 DIAGNOSIS — E1165 Type 2 diabetes mellitus with hyperglycemia: Secondary | ICD-10-CM | POA: Diagnosis not present

## 2018-11-29 DIAGNOSIS — E1122 Type 2 diabetes mellitus with diabetic chronic kidney disease: Secondary | ICD-10-CM | POA: Diagnosis not present

## 2018-11-29 DIAGNOSIS — I130 Hypertensive heart and chronic kidney disease with heart failure and stage 1 through stage 4 chronic kidney disease, or unspecified chronic kidney disease: Secondary | ICD-10-CM | POA: Diagnosis not present

## 2018-11-29 DIAGNOSIS — N184 Chronic kidney disease, stage 4 (severe): Secondary | ICD-10-CM | POA: Diagnosis not present

## 2018-11-29 DIAGNOSIS — I509 Heart failure, unspecified: Secondary | ICD-10-CM | POA: Diagnosis not present

## 2018-11-29 DIAGNOSIS — J209 Acute bronchitis, unspecified: Secondary | ICD-10-CM | POA: Diagnosis not present

## 2018-11-29 DIAGNOSIS — J441 Chronic obstructive pulmonary disease with (acute) exacerbation: Secondary | ICD-10-CM | POA: Diagnosis not present

## 2018-11-29 DIAGNOSIS — J440 Chronic obstructive pulmonary disease with acute lower respiratory infection: Secondary | ICD-10-CM | POA: Diagnosis not present

## 2018-11-29 DIAGNOSIS — F329 Major depressive disorder, single episode, unspecified: Secondary | ICD-10-CM | POA: Diagnosis not present

## 2018-12-03 DIAGNOSIS — J441 Chronic obstructive pulmonary disease with (acute) exacerbation: Secondary | ICD-10-CM | POA: Diagnosis not present

## 2018-12-04 DIAGNOSIS — I130 Hypertensive heart and chronic kidney disease with heart failure and stage 1 through stage 4 chronic kidney disease, or unspecified chronic kidney disease: Secondary | ICD-10-CM | POA: Diagnosis not present

## 2018-12-04 DIAGNOSIS — I509 Heart failure, unspecified: Secondary | ICD-10-CM | POA: Diagnosis not present

## 2018-12-04 DIAGNOSIS — N184 Chronic kidney disease, stage 4 (severe): Secondary | ICD-10-CM | POA: Diagnosis not present

## 2018-12-04 DIAGNOSIS — J441 Chronic obstructive pulmonary disease with (acute) exacerbation: Secondary | ICD-10-CM | POA: Diagnosis not present

## 2018-12-04 DIAGNOSIS — J440 Chronic obstructive pulmonary disease with acute lower respiratory infection: Secondary | ICD-10-CM | POA: Diagnosis not present

## 2018-12-04 DIAGNOSIS — E1165 Type 2 diabetes mellitus with hyperglycemia: Secondary | ICD-10-CM | POA: Diagnosis not present

## 2018-12-04 DIAGNOSIS — J209 Acute bronchitis, unspecified: Secondary | ICD-10-CM | POA: Diagnosis not present

## 2018-12-04 DIAGNOSIS — F329 Major depressive disorder, single episode, unspecified: Secondary | ICD-10-CM | POA: Diagnosis not present

## 2018-12-04 DIAGNOSIS — E1122 Type 2 diabetes mellitus with diabetic chronic kidney disease: Secondary | ICD-10-CM | POA: Diagnosis not present

## 2018-12-14 DIAGNOSIS — J449 Chronic obstructive pulmonary disease, unspecified: Secondary | ICD-10-CM | POA: Diagnosis not present

## 2018-12-15 DIAGNOSIS — E1165 Type 2 diabetes mellitus with hyperglycemia: Secondary | ICD-10-CM | POA: Diagnosis not present

## 2018-12-15 DIAGNOSIS — J209 Acute bronchitis, unspecified: Secondary | ICD-10-CM | POA: Diagnosis not present

## 2018-12-15 DIAGNOSIS — J441 Chronic obstructive pulmonary disease with (acute) exacerbation: Secondary | ICD-10-CM | POA: Diagnosis not present

## 2018-12-15 DIAGNOSIS — J440 Chronic obstructive pulmonary disease with acute lower respiratory infection: Secondary | ICD-10-CM | POA: Diagnosis not present

## 2018-12-30 DIAGNOSIS — I2510 Atherosclerotic heart disease of native coronary artery without angina pectoris: Secondary | ICD-10-CM | POA: Diagnosis not present

## 2018-12-30 DIAGNOSIS — N183 Chronic kidney disease, stage 3 (moderate): Secondary | ICD-10-CM | POA: Diagnosis not present

## 2018-12-30 DIAGNOSIS — Z794 Long term (current) use of insulin: Secondary | ICD-10-CM | POA: Diagnosis not present

## 2018-12-30 DIAGNOSIS — E1122 Type 2 diabetes mellitus with diabetic chronic kidney disease: Secondary | ICD-10-CM | POA: Diagnosis not present

## 2018-12-30 DIAGNOSIS — N179 Acute kidney failure, unspecified: Secondary | ICD-10-CM | POA: Diagnosis not present

## 2018-12-30 DIAGNOSIS — I129 Hypertensive chronic kidney disease with stage 1 through stage 4 chronic kidney disease, or unspecified chronic kidney disease: Secondary | ICD-10-CM | POA: Diagnosis not present

## 2018-12-30 DIAGNOSIS — N2581 Secondary hyperparathyroidism of renal origin: Secondary | ICD-10-CM | POA: Diagnosis not present

## 2018-12-30 DIAGNOSIS — R42 Dizziness and giddiness: Secondary | ICD-10-CM | POA: Diagnosis not present

## 2018-12-30 DIAGNOSIS — E785 Hyperlipidemia, unspecified: Secondary | ICD-10-CM | POA: Diagnosis not present

## 2019-01-03 DIAGNOSIS — E1159 Type 2 diabetes mellitus with other circulatory complications: Secondary | ICD-10-CM | POA: Diagnosis not present

## 2019-01-12 DIAGNOSIS — J449 Chronic obstructive pulmonary disease, unspecified: Secondary | ICD-10-CM | POA: Diagnosis not present

## 2019-01-13 DIAGNOSIS — J9611 Chronic respiratory failure with hypoxia: Secondary | ICD-10-CM | POA: Diagnosis not present

## 2019-01-13 DIAGNOSIS — E1169 Type 2 diabetes mellitus with other specified complication: Secondary | ICD-10-CM | POA: Diagnosis not present

## 2019-01-13 DIAGNOSIS — J449 Chronic obstructive pulmonary disease, unspecified: Secondary | ICD-10-CM | POA: Diagnosis not present

## 2019-01-13 DIAGNOSIS — E1159 Type 2 diabetes mellitus with other circulatory complications: Secondary | ICD-10-CM | POA: Diagnosis not present

## 2019-01-13 DIAGNOSIS — F339 Major depressive disorder, recurrent, unspecified: Secondary | ICD-10-CM | POA: Diagnosis not present

## 2019-01-13 DIAGNOSIS — E1165 Type 2 diabetes mellitus with hyperglycemia: Secondary | ICD-10-CM | POA: Diagnosis not present

## 2019-01-13 DIAGNOSIS — D649 Anemia, unspecified: Secondary | ICD-10-CM | POA: Diagnosis not present

## 2019-01-13 DIAGNOSIS — E1122 Type 2 diabetes mellitus with diabetic chronic kidney disease: Secondary | ICD-10-CM | POA: Diagnosis not present

## 2019-01-13 DIAGNOSIS — Z79899 Other long term (current) drug therapy: Secondary | ICD-10-CM | POA: Diagnosis not present

## 2019-01-28 ENCOUNTER — Other Ambulatory Visit: Payer: Self-pay

## 2019-01-28 ENCOUNTER — Ambulatory Visit: Payer: Medicare HMO | Admitting: Internal Medicine

## 2019-01-29 ENCOUNTER — Ambulatory Visit: Payer: Self-pay | Admitting: *Deleted

## 2019-01-29 NOTE — Telephone Encounter (Signed)
Pt was discharged from previous PCP per patient- no previous visits or letters on file stating discharge.  Pt has been taken in ED by EMS per [facility] Triage d/t extremely low BS levels.  Spoke with Kathrine Cords and Apolonio Schneiders on who should be made aware of his current state and possible admission -- pt is scheduled for a NP appt with Dr Jerilee Hoh 03/25/2019, had an appt 01/28/2019 for back pain which he no showed possibly d/t BS issues. We wanted to ensure that someone is aware of his current medical condition given he is not established with a PCP at this time but does have an upcoming appt later in this year.   Will send to Dr Jerilee Hoh as Juluis Rainier.  Rachel Bo is also aware.

## 2019-01-29 NOTE — Telephone Encounter (Signed)
Noted  

## 2019-01-29 NOTE — Telephone Encounter (Signed)
noted 

## 2019-01-29 NOTE — Telephone Encounter (Signed)
Pt initially calling to check on appt time with Dr. Jerilee Hoh, new patient appt. Appt was changed to June yesterday, practice spoke with pt at that time per agents report. Transferred to triage per agent as pt "Sounds confused." Pt states BS yesterday 23, states "I gave myself 45 units of Humalog insulin. Then. " when questioned pt stated , "No that was this morning it was 184." Pt then stated he had an insulin pump on, then retracted and stated it has been broken for 8 years. Pt is oriented to year, date. However, speech is disjointed, difficult to follow. Pt has home O2, does not have pulse Ox to check O2 level. States he slept all day yesterday when BS was 23. States "I guess I am mixed up." Pt lives alone. [place] called 911, stated on line with pt until their arrival.  Called practice, spoke with Arbie Cookey' to alert of disposition.  Answer Assessment - Initial Assessment Questions 1. SYMPTOMS: "What symptoms are you concerned about?"     *No Answer* 2. ONSET:  "When did the symptoms start?"     *No Answer* 3. BLOOD GLUCOSE: "What is your blood glucose level?"      *No Answer* 4. USUAL RANGE: "What is your blood glucose level usually?" (e.g., usual fasting morning value, usual evening value)     *No Answer* 5. TYPE 1 or 2:  "Do you know what type of diabetes you have?"  (e.g., Type 1, Type 2, Gestational; doesn't know)      *No Answer* 6. INSULIN: "Do you take insulin?" "What type of insulin(s) do you use? What is the mode of delivery? (syringe, pen (e.g., injection or  pump)      *No Answer* 7. DIABETES PILLS: "Do you take any pills for your diabetes?"     *No Answer* 8. OTHER SYMPTOMS: "Do you have any symptoms?" (e.g., fever, frequent urination, difficulty breathing, vomiting)     *No Answer* 9. LOW BLOOD GLUCOSE TREATMENT: "What have you done so far to treat the low blood glucose level?"     *No Answer* 10. FOOD: "When did you last eat or drink?"       *No Answer* 11. ALONE: "Are you alone  right now or is someone with you?"        alone 12. PREGNANCY: "Is there any chance you are pregnant?" "When was your last menstrual period?"       *No Answer*  Protocols used: DIABETES - LOW BLOOD SUGAR-A-AH

## 2019-02-11 DIAGNOSIS — E162 Hypoglycemia, unspecified: Secondary | ICD-10-CM | POA: Diagnosis not present

## 2019-02-11 DIAGNOSIS — R52 Pain, unspecified: Secondary | ICD-10-CM | POA: Diagnosis not present

## 2019-02-11 DIAGNOSIS — R0902 Hypoxemia: Secondary | ICD-10-CM | POA: Diagnosis not present

## 2019-02-11 DIAGNOSIS — E161 Other hypoglycemia: Secondary | ICD-10-CM | POA: Diagnosis not present

## 2019-02-11 DIAGNOSIS — R404 Transient alteration of awareness: Secondary | ICD-10-CM | POA: Diagnosis not present

## 2019-02-12 DIAGNOSIS — J449 Chronic obstructive pulmonary disease, unspecified: Secondary | ICD-10-CM | POA: Diagnosis not present

## 2019-02-13 DIAGNOSIS — I509 Heart failure, unspecified: Secondary | ICD-10-CM | POA: Diagnosis not present

## 2019-02-13 DIAGNOSIS — E161 Other hypoglycemia: Secondary | ICD-10-CM | POA: Diagnosis not present

## 2019-02-13 DIAGNOSIS — I130 Hypertensive heart and chronic kidney disease with heart failure and stage 1 through stage 4 chronic kidney disease, or unspecified chronic kidney disease: Secondary | ICD-10-CM | POA: Diagnosis not present

## 2019-02-13 DIAGNOSIS — Z743 Need for continuous supervision: Secondary | ICD-10-CM | POA: Diagnosis not present

## 2019-02-13 DIAGNOSIS — R5381 Other malaise: Secondary | ICD-10-CM | POA: Diagnosis not present

## 2019-02-13 DIAGNOSIS — N184 Chronic kidney disease, stage 4 (severe): Secondary | ICD-10-CM | POA: Diagnosis not present

## 2019-02-13 DIAGNOSIS — I129 Hypertensive chronic kidney disease with stage 1 through stage 4 chronic kidney disease, or unspecified chronic kidney disease: Secondary | ICD-10-CM | POA: Diagnosis not present

## 2019-02-13 DIAGNOSIS — R279 Unspecified lack of coordination: Secondary | ICD-10-CM | POA: Diagnosis not present

## 2019-02-13 DIAGNOSIS — E1122 Type 2 diabetes mellitus with diabetic chronic kidney disease: Secondary | ICD-10-CM | POA: Diagnosis not present

## 2019-02-13 DIAGNOSIS — I2510 Atherosclerotic heart disease of native coronary artery without angina pectoris: Secondary | ICD-10-CM | POA: Diagnosis not present

## 2019-02-13 DIAGNOSIS — E11649 Type 2 diabetes mellitus with hypoglycemia without coma: Secondary | ICD-10-CM | POA: Diagnosis not present

## 2019-02-13 DIAGNOSIS — Z8673 Personal history of transient ischemic attack (TIA), and cerebral infarction without residual deficits: Secondary | ICD-10-CM | POA: Diagnosis not present

## 2019-02-13 DIAGNOSIS — E162 Hypoglycemia, unspecified: Secondary | ICD-10-CM | POA: Diagnosis not present

## 2019-02-19 DIAGNOSIS — R4182 Altered mental status, unspecified: Secondary | ICD-10-CM | POA: Diagnosis not present

## 2019-02-19 DIAGNOSIS — W19XXXA Unspecified fall, initial encounter: Secondary | ICD-10-CM | POA: Diagnosis not present

## 2019-02-19 DIAGNOSIS — E538 Deficiency of other specified B group vitamins: Secondary | ICD-10-CM | POA: Diagnosis not present

## 2019-02-19 DIAGNOSIS — J449 Chronic obstructive pulmonary disease, unspecified: Secondary | ICD-10-CM | POA: Diagnosis not present

## 2019-02-20 DIAGNOSIS — R4182 Altered mental status, unspecified: Secondary | ICD-10-CM | POA: Diagnosis not present

## 2019-02-20 DIAGNOSIS — E538 Deficiency of other specified B group vitamins: Secondary | ICD-10-CM | POA: Diagnosis not present

## 2019-02-24 DIAGNOSIS — F419 Anxiety disorder, unspecified: Secondary | ICD-10-CM | POA: Diagnosis not present

## 2019-03-05 DIAGNOSIS — I10 Essential (primary) hypertension: Secondary | ICD-10-CM | POA: Diagnosis not present

## 2019-03-05 DIAGNOSIS — F419 Anxiety disorder, unspecified: Secondary | ICD-10-CM | POA: Diagnosis not present

## 2019-03-12 DIAGNOSIS — S90414A Abrasion, right lesser toe(s), initial encounter: Secondary | ICD-10-CM | POA: Diagnosis not present

## 2019-03-14 DIAGNOSIS — J449 Chronic obstructive pulmonary disease, unspecified: Secondary | ICD-10-CM | POA: Diagnosis not present

## 2019-03-23 DIAGNOSIS — M25571 Pain in right ankle and joints of right foot: Secondary | ICD-10-CM | POA: Diagnosis not present

## 2019-03-23 DIAGNOSIS — R531 Weakness: Secondary | ICD-10-CM | POA: Diagnosis not present

## 2019-03-23 DIAGNOSIS — S8001XA Contusion of right knee, initial encounter: Secondary | ICD-10-CM | POA: Diagnosis not present

## 2019-03-23 DIAGNOSIS — R51 Headache: Secondary | ICD-10-CM | POA: Diagnosis not present

## 2019-03-23 DIAGNOSIS — R52 Pain, unspecified: Secondary | ICD-10-CM | POA: Diagnosis not present

## 2019-03-23 DIAGNOSIS — R000 Tachycardia, unspecified: Secondary | ICD-10-CM | POA: Diagnosis not present

## 2019-03-23 DIAGNOSIS — M25561 Pain in right knee: Secondary | ICD-10-CM | POA: Diagnosis not present

## 2019-03-25 ENCOUNTER — Ambulatory Visit: Payer: Medicare Other | Admitting: Internal Medicine

## 2019-03-27 DIAGNOSIS — R61 Generalized hyperhidrosis: Secondary | ICD-10-CM | POA: Diagnosis not present

## 2019-03-27 DIAGNOSIS — E162 Hypoglycemia, unspecified: Secondary | ICD-10-CM | POA: Diagnosis not present

## 2019-03-27 DIAGNOSIS — E11649 Type 2 diabetes mellitus with hypoglycemia without coma: Secondary | ICD-10-CM | POA: Diagnosis not present

## 2019-03-27 DIAGNOSIS — R404 Transient alteration of awareness: Secondary | ICD-10-CM | POA: Diagnosis not present

## 2019-03-27 DIAGNOSIS — E161 Other hypoglycemia: Secondary | ICD-10-CM | POA: Diagnosis not present

## 2019-03-27 DIAGNOSIS — R51 Headache: Secondary | ICD-10-CM | POA: Diagnosis not present

## 2019-03-31 DIAGNOSIS — R000 Tachycardia, unspecified: Secondary | ICD-10-CM | POA: Diagnosis not present

## 2019-03-31 DIAGNOSIS — E119 Type 2 diabetes mellitus without complications: Secondary | ICD-10-CM | POA: Diagnosis not present

## 2019-03-31 DIAGNOSIS — Z794 Long term (current) use of insulin: Secondary | ICD-10-CM | POA: Diagnosis not present

## 2019-03-31 DIAGNOSIS — I10 Essential (primary) hypertension: Secondary | ICD-10-CM | POA: Diagnosis not present

## 2019-03-31 DIAGNOSIS — Z79899 Other long term (current) drug therapy: Secondary | ICD-10-CM | POA: Diagnosis not present

## 2019-04-09 DIAGNOSIS — F419 Anxiety disorder, unspecified: Secondary | ICD-10-CM | POA: Diagnosis not present

## 2019-04-14 DIAGNOSIS — J449 Chronic obstructive pulmonary disease, unspecified: Secondary | ICD-10-CM | POA: Diagnosis not present

## 2019-04-17 DIAGNOSIS — I2510 Atherosclerotic heart disease of native coronary artery without angina pectoris: Secondary | ICD-10-CM | POA: Diagnosis not present

## 2019-04-17 DIAGNOSIS — N183 Chronic kidney disease, stage 3 (moderate): Secondary | ICD-10-CM | POA: Diagnosis not present

## 2019-04-17 DIAGNOSIS — I10 Essential (primary) hypertension: Secondary | ICD-10-CM | POA: Diagnosis not present

## 2019-04-17 DIAGNOSIS — F329 Major depressive disorder, single episode, unspecified: Secondary | ICD-10-CM | POA: Diagnosis not present

## 2019-04-17 DIAGNOSIS — N2581 Secondary hyperparathyroidism of renal origin: Secondary | ICD-10-CM | POA: Diagnosis not present

## 2019-04-17 DIAGNOSIS — I129 Hypertensive chronic kidney disease with stage 1 through stage 4 chronic kidney disease, or unspecified chronic kidney disease: Secondary | ICD-10-CM | POA: Diagnosis not present

## 2019-04-17 DIAGNOSIS — Z79899 Other long term (current) drug therapy: Secondary | ICD-10-CM | POA: Diagnosis not present

## 2019-04-17 DIAGNOSIS — E1122 Type 2 diabetes mellitus with diabetic chronic kidney disease: Secondary | ICD-10-CM | POA: Diagnosis not present

## 2019-04-17 DIAGNOSIS — E785 Hyperlipidemia, unspecified: Secondary | ICD-10-CM | POA: Diagnosis not present

## 2019-04-17 DIAGNOSIS — E119 Type 2 diabetes mellitus without complications: Secondary | ICD-10-CM | POA: Diagnosis not present

## 2019-04-24 DIAGNOSIS — M545 Low back pain: Secondary | ICD-10-CM | POA: Diagnosis not present

## 2019-04-24 DIAGNOSIS — Z79899 Other long term (current) drug therapy: Secondary | ICD-10-CM | POA: Diagnosis not present

## 2019-04-24 DIAGNOSIS — M542 Cervicalgia: Secondary | ICD-10-CM | POA: Diagnosis not present

## 2019-04-24 DIAGNOSIS — G4700 Insomnia, unspecified: Secondary | ICD-10-CM | POA: Diagnosis not present

## 2019-04-24 DIAGNOSIS — M25561 Pain in right knee: Secondary | ICD-10-CM | POA: Diagnosis not present

## 2019-04-24 DIAGNOSIS — E119 Type 2 diabetes mellitus without complications: Secondary | ICD-10-CM | POA: Diagnosis not present

## 2019-04-24 DIAGNOSIS — M25562 Pain in left knee: Secondary | ICD-10-CM | POA: Diagnosis not present

## 2019-05-02 ENCOUNTER — Telehealth: Payer: Self-pay | Admitting: Internal Medicine

## 2019-05-02 NOTE — Telephone Encounter (Signed)

## 2019-05-05 ENCOUNTER — Telehealth (INDEPENDENT_AMBULATORY_CARE_PROVIDER_SITE_OTHER): Payer: Medicare HMO | Admitting: Internal Medicine

## 2019-05-05 ENCOUNTER — Other Ambulatory Visit: Payer: Self-pay

## 2019-05-05 DIAGNOSIS — I5030 Unspecified diastolic (congestive) heart failure: Secondary | ICD-10-CM

## 2019-05-05 DIAGNOSIS — I2510 Atherosclerotic heart disease of native coronary artery without angina pectoris: Secondary | ICD-10-CM

## 2019-05-05 DIAGNOSIS — E782 Mixed hyperlipidemia: Secondary | ICD-10-CM | POA: Diagnosis not present

## 2019-05-05 DIAGNOSIS — I10 Essential (primary) hypertension: Secondary | ICD-10-CM | POA: Diagnosis not present
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
## 2019-05-05 NOTE — Telephone Encounter (Signed)
Left message x2 that we are trying to reach for 8 am telehealth visit w Dr. Harrington Challenger. Asked pt to call back.

## 2019-05-05 NOTE — Patient Instructions (Signed)
Medication Instructions:  No changes If you need a refill on your cardiac medications before your next appointment, please call your pharmacy.   Lab work: none  Testing/Procedures: none   Any Other Special Instructions Will Be Listed Below (If Applicable). We have requested visit notes and lab results from [facility] on [address] and [address] be faxed to our office.

## 2019-05-08 DIAGNOSIS — F331 Major depressive disorder, recurrent, moderate: Secondary | ICD-10-CM | POA: Diagnosis not present

## 2019-05-14 DIAGNOSIS — J449 Chronic obstructive pulmonary disease, unspecified: Secondary | ICD-10-CM | POA: Diagnosis not present

## 2019-05-15 ENCOUNTER — Telehealth: Payer: Self-pay | Admitting: Licensed Clinical Social Worker

## 2019-05-15 NOTE — Telephone Encounter (Signed)
CSW referred to assist patient with obtaining a scale. CSW contacted patient to inform scale will be delivered to home. Message left. CSW available as needed. Jackie Lupita Rosales, LCSW, CCSW-MCS [phone]  

## 2019-05-29 DIAGNOSIS — G4733 Obstructive sleep apnea (adult) (pediatric): Secondary | ICD-10-CM | POA: Diagnosis not present

## 2019-05-29 DIAGNOSIS — Z79899 Other long term (current) drug therapy: Secondary | ICD-10-CM | POA: Diagnosis not present

## 2019-05-29 DIAGNOSIS — E119 Type 2 diabetes mellitus without complications: Secondary | ICD-10-CM | POA: Diagnosis not present

## 2019-05-29 DIAGNOSIS — I10 Essential (primary) hypertension: Secondary | ICD-10-CM | POA: Diagnosis not present

## 2019-06-03 DIAGNOSIS — R531 Weakness: Secondary | ICD-10-CM | POA: Diagnosis not present

## 2019-06-03 DIAGNOSIS — M25561 Pain in right knee: Secondary | ICD-10-CM | POA: Diagnosis not present

## 2019-06-03 DIAGNOSIS — R55 Syncope and collapse: Secondary | ICD-10-CM | POA: Diagnosis not present

## 2019-06-03 DIAGNOSIS — S199XXA Unspecified injury of neck, initial encounter: Secondary | ICD-10-CM | POA: Diagnosis not present

## 2019-06-03 DIAGNOSIS — M25511 Pain in right shoulder: Secondary | ICD-10-CM | POA: Diagnosis not present

## 2019-06-03 DIAGNOSIS — E11649 Type 2 diabetes mellitus with hypoglycemia without coma: Secondary | ICD-10-CM | POA: Diagnosis not present

## 2019-06-03 DIAGNOSIS — R0689 Other abnormalities of breathing: Secondary | ICD-10-CM | POA: Diagnosis not present

## 2019-06-03 DIAGNOSIS — S4991XA Unspecified injury of right shoulder and upper arm, initial encounter: Secondary | ICD-10-CM | POA: Diagnosis not present

## 2019-06-03 DIAGNOSIS — M542 Cervicalgia: Secondary | ICD-10-CM | POA: Diagnosis not present

## 2019-06-03 DIAGNOSIS — Z794 Long term (current) use of insulin: Secondary | ICD-10-CM | POA: Diagnosis not present

## 2019-06-03 DIAGNOSIS — W19XXXA Unspecified fall, initial encounter: Secondary | ICD-10-CM | POA: Diagnosis not present

## 2019-06-03 DIAGNOSIS — S8991XA Unspecified injury of right lower leg, initial encounter: Secondary | ICD-10-CM | POA: Diagnosis not present

## 2019-06-03 NOTE — Telephone Encounter (Signed)
This encounter was created in error - please disregard.

## 2019-06-07 DIAGNOSIS — R4182 Altered mental status, unspecified: Secondary | ICD-10-CM | POA: Diagnosis not present

## 2019-06-07 DIAGNOSIS — E162 Hypoglycemia, unspecified: Secondary | ICD-10-CM | POA: Diagnosis not present

## 2019-06-07 DIAGNOSIS — E11649 Type 2 diabetes mellitus with hypoglycemia without coma: Secondary | ICD-10-CM | POA: Diagnosis not present

## 2019-06-07 DIAGNOSIS — R079 Chest pain, unspecified: Secondary | ICD-10-CM | POA: Diagnosis not present

## 2019-06-07 DIAGNOSIS — Z9989 Dependence on other enabling machines and devices: Secondary | ICD-10-CM | POA: Diagnosis not present

## 2019-06-07 DIAGNOSIS — N183 Chronic kidney disease, stage 3 (moderate): Secondary | ICD-10-CM | POA: Diagnosis not present

## 2019-06-07 DIAGNOSIS — I509 Heart failure, unspecified: Secondary | ICD-10-CM | POA: Diagnosis not present

## 2019-06-08 DIAGNOSIS — K219 Gastro-esophageal reflux disease without esophagitis: Secondary | ICD-10-CM | POA: Diagnosis not present

## 2019-06-08 DIAGNOSIS — N183 Chronic kidney disease, stage 3 (moderate): Secondary | ICD-10-CM | POA: Diagnosis not present

## 2019-06-08 DIAGNOSIS — Z789 Other specified health status: Secondary | ICD-10-CM | POA: Diagnosis not present

## 2019-06-08 DIAGNOSIS — Z9181 History of falling: Secondary | ICD-10-CM | POA: Diagnosis not present

## 2019-06-08 DIAGNOSIS — E1140 Type 2 diabetes mellitus with diabetic neuropathy, unspecified: Secondary | ICD-10-CM | POA: Diagnosis not present

## 2019-06-08 DIAGNOSIS — I69923 Fluency disorder following unspecified cerebrovascular disease: Secondary | ICD-10-CM | POA: Diagnosis not present

## 2019-06-08 DIAGNOSIS — I130 Hypertensive heart and chronic kidney disease with heart failure and stage 1 through stage 4 chronic kidney disease, or unspecified chronic kidney disease: Secondary | ICD-10-CM | POA: Diagnosis not present

## 2019-06-08 DIAGNOSIS — T383X1A Poisoning by insulin and oral hypoglycemic [antidiabetic] drugs, accidental (unintentional), initial encounter: Secondary | ICD-10-CM | POA: Diagnosis not present

## 2019-06-08 DIAGNOSIS — Z20828 Contact with and (suspected) exposure to other viral communicable diseases: Secondary | ICD-10-CM | POA: Diagnosis not present

## 2019-06-08 DIAGNOSIS — R079 Chest pain, unspecified: Secondary | ICD-10-CM | POA: Diagnosis not present

## 2019-06-08 DIAGNOSIS — I5032 Chronic diastolic (congestive) heart failure: Secondary | ICD-10-CM | POA: Diagnosis not present

## 2019-06-08 DIAGNOSIS — E1122 Type 2 diabetes mellitus with diabetic chronic kidney disease: Secondary | ICD-10-CM | POA: Diagnosis not present

## 2019-06-08 DIAGNOSIS — I509 Heart failure, unspecified: Secondary | ICD-10-CM | POA: Diagnosis not present

## 2019-06-08 DIAGNOSIS — E1142 Type 2 diabetes mellitus with diabetic polyneuropathy: Secondary | ICD-10-CM | POA: Diagnosis not present

## 2019-06-08 DIAGNOSIS — R4182 Altered mental status, unspecified: Secondary | ICD-10-CM | POA: Diagnosis not present

## 2019-06-08 DIAGNOSIS — E119 Type 2 diabetes mellitus without complications: Secondary | ICD-10-CM | POA: Diagnosis not present

## 2019-06-08 DIAGNOSIS — I10 Essential (primary) hypertension: Secondary | ICD-10-CM | POA: Diagnosis not present

## 2019-06-08 DIAGNOSIS — E11649 Type 2 diabetes mellitus with hypoglycemia without coma: Secondary | ICD-10-CM | POA: Diagnosis not present

## 2019-06-08 DIAGNOSIS — M25549 Pain in joints of unspecified hand: Secondary | ICD-10-CM | POA: Diagnosis not present

## 2019-06-08 DIAGNOSIS — F339 Major depressive disorder, recurrent, unspecified: Secondary | ICD-10-CM | POA: Diagnosis not present

## 2019-06-08 DIAGNOSIS — E162 Hypoglycemia, unspecified: Secondary | ICD-10-CM | POA: Diagnosis not present

## 2019-06-11 DIAGNOSIS — F339 Major depressive disorder, recurrent, unspecified: Secondary | ICD-10-CM | POA: Diagnosis not present

## 2019-06-11 DIAGNOSIS — E119 Type 2 diabetes mellitus without complications: Secondary | ICD-10-CM | POA: Diagnosis not present

## 2019-06-11 DIAGNOSIS — M25549 Pain in joints of unspecified hand: Secondary | ICD-10-CM | POA: Diagnosis not present

## 2019-06-11 DIAGNOSIS — K219 Gastro-esophageal reflux disease without esophagitis: Secondary | ICD-10-CM | POA: Diagnosis not present

## 2019-06-11 DIAGNOSIS — Z9181 History of falling: Secondary | ICD-10-CM | POA: Diagnosis not present

## 2019-06-11 DIAGNOSIS — Z789 Other specified health status: Secondary | ICD-10-CM | POA: Diagnosis not present

## 2019-06-11 DIAGNOSIS — I509 Heart failure, unspecified: Secondary | ICD-10-CM | POA: Diagnosis not present

## 2019-06-11 DIAGNOSIS — E1140 Type 2 diabetes mellitus with diabetic neuropathy, unspecified: Secondary | ICD-10-CM | POA: Diagnosis not present

## 2019-06-11 DIAGNOSIS — E162 Hypoglycemia, unspecified: Secondary | ICD-10-CM | POA: Diagnosis not present

## 2019-06-11 DIAGNOSIS — I69923 Fluency disorder following unspecified cerebrovascular disease: Secondary | ICD-10-CM | POA: Diagnosis not present

## 2019-06-11 DIAGNOSIS — I10 Essential (primary) hypertension: Secondary | ICD-10-CM | POA: Diagnosis not present

## 2019-06-11 DIAGNOSIS — N183 Chronic kidney disease, stage 3 (moderate): Secondary | ICD-10-CM | POA: Diagnosis not present

## 2019-06-11 DIAGNOSIS — E1122 Type 2 diabetes mellitus with diabetic chronic kidney disease: Secondary | ICD-10-CM | POA: Diagnosis not present

## 2019-06-14 DIAGNOSIS — J449 Chronic obstructive pulmonary disease, unspecified: Secondary | ICD-10-CM | POA: Diagnosis not present

## 2019-06-16 DIAGNOSIS — J449 Chronic obstructive pulmonary disease, unspecified: Secondary | ICD-10-CM | POA: Diagnosis not present

## 2019-06-16 DIAGNOSIS — I509 Heart failure, unspecified: Secondary | ICD-10-CM | POA: Diagnosis not present

## 2019-06-16 DIAGNOSIS — I132 Hypertensive heart and chronic kidney disease with heart failure and with stage 5 chronic kidney disease, or end stage renal disease: Secondary | ICD-10-CM | POA: Diagnosis not present

## 2019-06-16 DIAGNOSIS — E162 Hypoglycemia, unspecified: Secondary | ICD-10-CM | POA: Diagnosis not present

## 2019-06-16 DIAGNOSIS — E1122 Type 2 diabetes mellitus with diabetic chronic kidney disease: Secondary | ICD-10-CM | POA: Diagnosis not present

## 2019-06-16 DIAGNOSIS — I252 Old myocardial infarction: Secondary | ICD-10-CM | POA: Diagnosis not present

## 2019-06-16 DIAGNOSIS — Z743 Need for continuous supervision: Secondary | ICD-10-CM | POA: Diagnosis not present

## 2019-06-16 DIAGNOSIS — I499 Cardiac arrhythmia, unspecified: Secondary | ICD-10-CM | POA: Diagnosis not present

## 2019-06-16 DIAGNOSIS — N186 End stage renal disease: Secondary | ICD-10-CM | POA: Diagnosis not present

## 2019-06-16 DIAGNOSIS — Z8673 Personal history of transient ischemic attack (TIA), and cerebral infarction without residual deficits: Secondary | ICD-10-CM | POA: Diagnosis not present

## 2019-06-16 DIAGNOSIS — E11649 Type 2 diabetes mellitus with hypoglycemia without coma: Secondary | ICD-10-CM | POA: Diagnosis not present

## 2019-06-16 DIAGNOSIS — I2510 Atherosclerotic heart disease of native coronary artery without angina pectoris: Secondary | ICD-10-CM | POA: Diagnosis not present

## 2019-06-16 DIAGNOSIS — R4182 Altered mental status, unspecified: Secondary | ICD-10-CM | POA: Diagnosis not present

## 2019-06-24 DIAGNOSIS — M129 Arthropathy, unspecified: Secondary | ICD-10-CM | POA: Diagnosis not present

## 2019-06-24 DIAGNOSIS — E559 Vitamin D deficiency, unspecified: Secondary | ICD-10-CM | POA: Diagnosis not present

## 2019-06-24 DIAGNOSIS — G894 Chronic pain syndrome: Secondary | ICD-10-CM | POA: Diagnosis not present

## 2019-06-24 DIAGNOSIS — Z79899 Other long term (current) drug therapy: Secondary | ICD-10-CM | POA: Diagnosis not present

## 2019-06-24 DIAGNOSIS — M47816 Spondylosis without myelopathy or radiculopathy, lumbar region: Secondary | ICD-10-CM | POA: Diagnosis not present

## 2019-06-24 DIAGNOSIS — I10 Essential (primary) hypertension: Secondary | ICD-10-CM | POA: Diagnosis not present

## 2019-06-25 DIAGNOSIS — J9611 Chronic respiratory failure with hypoxia: Secondary | ICD-10-CM | POA: Diagnosis not present

## 2019-06-25 DIAGNOSIS — J449 Chronic obstructive pulmonary disease, unspecified: Secondary | ICD-10-CM | POA: Diagnosis not present

## 2019-06-25 DIAGNOSIS — E538 Deficiency of other specified B group vitamins: Secondary | ICD-10-CM | POA: Diagnosis not present

## 2019-06-25 DIAGNOSIS — E039 Hypothyroidism, unspecified: Secondary | ICD-10-CM | POA: Diagnosis not present

## 2019-06-25 DIAGNOSIS — E1159 Type 2 diabetes mellitus with other circulatory complications: Secondary | ICD-10-CM | POA: Diagnosis not present

## 2019-06-25 DIAGNOSIS — F39 Unspecified mood [affective] disorder: Secondary | ICD-10-CM | POA: Diagnosis not present

## 2019-06-25 DIAGNOSIS — E1122 Type 2 diabetes mellitus with diabetic chronic kidney disease: Secondary | ICD-10-CM | POA: Diagnosis not present

## 2019-06-25 DIAGNOSIS — N184 Chronic kidney disease, stage 4 (severe): Secondary | ICD-10-CM | POA: Diagnosis not present

## 2019-06-25 DIAGNOSIS — F339 Major depressive disorder, recurrent, unspecified: Secondary | ICD-10-CM | POA: Diagnosis not present

## 2019-06-25 DIAGNOSIS — I10 Essential (primary) hypertension: Secondary | ICD-10-CM | POA: Diagnosis not present

## 2019-06-25 DIAGNOSIS — E1165 Type 2 diabetes mellitus with hyperglycemia: Secondary | ICD-10-CM | POA: Diagnosis not present

## 2019-06-27 DIAGNOSIS — E11622 Type 2 diabetes mellitus with other skin ulcer: Secondary | ICD-10-CM | POA: Diagnosis not present

## 2019-06-27 DIAGNOSIS — L97829 Non-pressure chronic ulcer of other part of left lower leg with unspecified severity: Secondary | ICD-10-CM | POA: Diagnosis not present

## 2019-06-27 DIAGNOSIS — E1165 Type 2 diabetes mellitus with hyperglycemia: Secondary | ICD-10-CM | POA: Diagnosis not present

## 2019-06-27 DIAGNOSIS — R413 Other amnesia: Secondary | ICD-10-CM | POA: Diagnosis not present

## 2019-06-27 DIAGNOSIS — Z6837 Body mass index (BMI) 37.0-37.9, adult: Secondary | ICD-10-CM | POA: Diagnosis not present

## 2019-07-02 DIAGNOSIS — Z6837 Body mass index (BMI) 37.0-37.9, adult: Secondary | ICD-10-CM | POA: Diagnosis not present

## 2019-07-02 DIAGNOSIS — E1165 Type 2 diabetes mellitus with hyperglycemia: Secondary | ICD-10-CM | POA: Diagnosis not present

## 2019-07-02 DIAGNOSIS — E11622 Type 2 diabetes mellitus with other skin ulcer: Secondary | ICD-10-CM | POA: Diagnosis not present

## 2019-07-02 DIAGNOSIS — L97829 Non-pressure chronic ulcer of other part of left lower leg with unspecified severity: Secondary | ICD-10-CM | POA: Diagnosis not present

## 2019-07-04 DIAGNOSIS — E1159 Type 2 diabetes mellitus with other circulatory complications: Secondary | ICD-10-CM | POA: Diagnosis not present

## 2019-07-08 DIAGNOSIS — M1710 Unilateral primary osteoarthritis, unspecified knee: Secondary | ICD-10-CM | POA: Diagnosis not present

## 2019-07-08 DIAGNOSIS — E559 Vitamin D deficiency, unspecified: Secondary | ICD-10-CM | POA: Diagnosis not present

## 2019-07-08 DIAGNOSIS — M47816 Spondylosis without myelopathy or radiculopathy, lumbar region: Secondary | ICD-10-CM | POA: Diagnosis not present

## 2019-07-08 DIAGNOSIS — G894 Chronic pain syndrome: Secondary | ICD-10-CM | POA: Diagnosis not present

## 2019-07-08 DIAGNOSIS — Z79899 Other long term (current) drug therapy: Secondary | ICD-10-CM | POA: Diagnosis not present

## 2019-07-09 DIAGNOSIS — L97829 Non-pressure chronic ulcer of other part of left lower leg with unspecified severity: Secondary | ICD-10-CM | POA: Diagnosis not present

## 2019-07-09 DIAGNOSIS — E1165 Type 2 diabetes mellitus with hyperglycemia: Secondary | ICD-10-CM | POA: Diagnosis not present

## 2019-07-09 DIAGNOSIS — R2689 Other abnormalities of gait and mobility: Secondary | ICD-10-CM | POA: Diagnosis not present

## 2019-07-09 DIAGNOSIS — R531 Weakness: Secondary | ICD-10-CM | POA: Diagnosis not present

## 2019-07-09 DIAGNOSIS — E11622 Type 2 diabetes mellitus with other skin ulcer: Secondary | ICD-10-CM | POA: Diagnosis not present

## 2019-07-09 DIAGNOSIS — W19XXXA Unspecified fall, initial encounter: Secondary | ICD-10-CM | POA: Diagnosis not present

## 2019-07-15 DIAGNOSIS — J449 Chronic obstructive pulmonary disease, unspecified: Secondary | ICD-10-CM | POA: Diagnosis not present

## 2019-07-23 DIAGNOSIS — Z6839 Body mass index (BMI) 39.0-39.9, adult: Secondary | ICD-10-CM | POA: Diagnosis not present

## 2019-07-23 DIAGNOSIS — E1165 Type 2 diabetes mellitus with hyperglycemia: Secondary | ICD-10-CM | POA: Diagnosis not present

## 2019-07-23 DIAGNOSIS — L97829 Non-pressure chronic ulcer of other part of left lower leg with unspecified severity: Secondary | ICD-10-CM | POA: Diagnosis not present

## 2019-07-23 DIAGNOSIS — E11622 Type 2 diabetes mellitus with other skin ulcer: Secondary | ICD-10-CM | POA: Diagnosis not present

## 2019-07-28 DIAGNOSIS — E1142 Type 2 diabetes mellitus with diabetic polyneuropathy: Secondary | ICD-10-CM | POA: Diagnosis not present

## 2019-07-28 DIAGNOSIS — I130 Hypertensive heart and chronic kidney disease with heart failure and stage 1 through stage 4 chronic kidney disease, or unspecified chronic kidney disease: Secondary | ICD-10-CM | POA: Diagnosis not present

## 2019-07-28 DIAGNOSIS — E1165 Type 2 diabetes mellitus with hyperglycemia: Secondary | ICD-10-CM | POA: Diagnosis not present

## 2019-07-28 DIAGNOSIS — I509 Heart failure, unspecified: Secondary | ICD-10-CM | POA: Diagnosis not present

## 2019-07-28 DIAGNOSIS — L97829 Non-pressure chronic ulcer of other part of left lower leg with unspecified severity: Secondary | ICD-10-CM | POA: Diagnosis not present

## 2019-07-28 DIAGNOSIS — J449 Chronic obstructive pulmonary disease, unspecified: Secondary | ICD-10-CM | POA: Diagnosis not present

## 2019-07-28 DIAGNOSIS — N184 Chronic kidney disease, stage 4 (severe): Secondary | ICD-10-CM | POA: Diagnosis not present

## 2019-07-28 DIAGNOSIS — E11622 Type 2 diabetes mellitus with other skin ulcer: Secondary | ICD-10-CM | POA: Diagnosis not present

## 2019-07-28 DIAGNOSIS — E1122 Type 2 diabetes mellitus with diabetic chronic kidney disease: Secondary | ICD-10-CM | POA: Diagnosis not present

## 2019-07-30 DIAGNOSIS — N184 Chronic kidney disease, stage 4 (severe): Secondary | ICD-10-CM | POA: Diagnosis not present

## 2019-07-30 DIAGNOSIS — E1165 Type 2 diabetes mellitus with hyperglycemia: Secondary | ICD-10-CM | POA: Diagnosis not present

## 2019-07-30 DIAGNOSIS — I509 Heart failure, unspecified: Secondary | ICD-10-CM | POA: Diagnosis not present

## 2019-07-30 DIAGNOSIS — E1142 Type 2 diabetes mellitus with diabetic polyneuropathy: Secondary | ICD-10-CM | POA: Diagnosis not present

## 2019-07-30 DIAGNOSIS — J449 Chronic obstructive pulmonary disease, unspecified: Secondary | ICD-10-CM | POA: Diagnosis not present

## 2019-07-30 DIAGNOSIS — L97829 Non-pressure chronic ulcer of other part of left lower leg with unspecified severity: Secondary | ICD-10-CM | POA: Diagnosis not present

## 2019-07-30 DIAGNOSIS — E1122 Type 2 diabetes mellitus with diabetic chronic kidney disease: Secondary | ICD-10-CM | POA: Diagnosis not present

## 2019-07-30 DIAGNOSIS — I130 Hypertensive heart and chronic kidney disease with heart failure and stage 1 through stage 4 chronic kidney disease, or unspecified chronic kidney disease: Secondary | ICD-10-CM | POA: Diagnosis not present

## 2019-07-30 DIAGNOSIS — E11622 Type 2 diabetes mellitus with other skin ulcer: Secondary | ICD-10-CM | POA: Diagnosis not present

## 2019-08-05 DIAGNOSIS — E11622 Type 2 diabetes mellitus with other skin ulcer: Secondary | ICD-10-CM | POA: Diagnosis not present

## 2019-08-05 DIAGNOSIS — L97829 Non-pressure chronic ulcer of other part of left lower leg with unspecified severity: Secondary | ICD-10-CM | POA: Diagnosis not present

## 2019-08-05 DIAGNOSIS — I509 Heart failure, unspecified: Secondary | ICD-10-CM | POA: Diagnosis not present

## 2019-08-05 DIAGNOSIS — E1142 Type 2 diabetes mellitus with diabetic polyneuropathy: Secondary | ICD-10-CM | POA: Diagnosis not present

## 2019-08-05 DIAGNOSIS — E1165 Type 2 diabetes mellitus with hyperglycemia: Secondary | ICD-10-CM | POA: Diagnosis not present

## 2019-08-05 DIAGNOSIS — J449 Chronic obstructive pulmonary disease, unspecified: Secondary | ICD-10-CM | POA: Diagnosis not present

## 2019-08-05 DIAGNOSIS — N184 Chronic kidney disease, stage 4 (severe): Secondary | ICD-10-CM | POA: Diagnosis not present

## 2019-08-05 DIAGNOSIS — I130 Hypertensive heart and chronic kidney disease with heart failure and stage 1 through stage 4 chronic kidney disease, or unspecified chronic kidney disease: Secondary | ICD-10-CM | POA: Diagnosis not present

## 2019-08-05 DIAGNOSIS — E1122 Type 2 diabetes mellitus with diabetic chronic kidney disease: Secondary | ICD-10-CM | POA: Diagnosis not present

## 2019-08-06 DIAGNOSIS — G894 Chronic pain syndrome: Secondary | ICD-10-CM | POA: Diagnosis not present

## 2019-08-06 DIAGNOSIS — M1710 Unilateral primary osteoarthritis, unspecified knee: Secondary | ICD-10-CM | POA: Diagnosis not present

## 2019-08-06 DIAGNOSIS — Z7189 Other specified counseling: Secondary | ICD-10-CM | POA: Diagnosis not present

## 2019-08-06 DIAGNOSIS — Z79899 Other long term (current) drug therapy: Secondary | ICD-10-CM | POA: Diagnosis not present

## 2019-08-06 DIAGNOSIS — Z1159 Encounter for screening for other viral diseases: Secondary | ICD-10-CM | POA: Diagnosis not present

## 2019-08-08 DIAGNOSIS — E1165 Type 2 diabetes mellitus with hyperglycemia: Secondary | ICD-10-CM | POA: Diagnosis not present

## 2019-08-08 DIAGNOSIS — E11622 Type 2 diabetes mellitus with other skin ulcer: Secondary | ICD-10-CM | POA: Diagnosis not present

## 2019-08-08 DIAGNOSIS — E1142 Type 2 diabetes mellitus with diabetic polyneuropathy: Secondary | ICD-10-CM | POA: Diagnosis not present

## 2019-08-08 DIAGNOSIS — E1122 Type 2 diabetes mellitus with diabetic chronic kidney disease: Secondary | ICD-10-CM | POA: Diagnosis not present

## 2019-08-08 DIAGNOSIS — L97829 Non-pressure chronic ulcer of other part of left lower leg with unspecified severity: Secondary | ICD-10-CM | POA: Diagnosis not present

## 2019-08-08 DIAGNOSIS — I509 Heart failure, unspecified: Secondary | ICD-10-CM | POA: Diagnosis not present

## 2019-08-08 DIAGNOSIS — J449 Chronic obstructive pulmonary disease, unspecified: Secondary | ICD-10-CM | POA: Diagnosis not present

## 2019-08-08 DIAGNOSIS — N184 Chronic kidney disease, stage 4 (severe): Secondary | ICD-10-CM | POA: Diagnosis not present

## 2019-08-08 DIAGNOSIS — I130 Hypertensive heart and chronic kidney disease with heart failure and stage 1 through stage 4 chronic kidney disease, or unspecified chronic kidney disease: Secondary | ICD-10-CM | POA: Diagnosis not present

## 2019-08-11 DIAGNOSIS — M542 Cervicalgia: Secondary | ICD-10-CM | POA: Diagnosis not present

## 2019-08-11 DIAGNOSIS — L219 Seborrheic dermatitis, unspecified: Secondary | ICD-10-CM | POA: Diagnosis not present

## 2019-08-11 DIAGNOSIS — F332 Major depressive disorder, recurrent severe without psychotic features: Secondary | ICD-10-CM | POA: Diagnosis not present

## 2019-08-11 DIAGNOSIS — R05 Cough: Secondary | ICD-10-CM | POA: Diagnosis not present

## 2019-08-11 DIAGNOSIS — Z6841 Body Mass Index (BMI) 40.0 and over, adult: Secondary | ICD-10-CM | POA: Diagnosis not present

## 2019-08-11 DIAGNOSIS — E1165 Type 2 diabetes mellitus with hyperglycemia: Secondary | ICD-10-CM | POA: Diagnosis not present

## 2019-08-13 DIAGNOSIS — I130 Hypertensive heart and chronic kidney disease with heart failure and stage 1 through stage 4 chronic kidney disease, or unspecified chronic kidney disease: Secondary | ICD-10-CM | POA: Diagnosis not present

## 2019-08-13 DIAGNOSIS — E1122 Type 2 diabetes mellitus with diabetic chronic kidney disease: Secondary | ICD-10-CM | POA: Diagnosis not present

## 2019-08-13 DIAGNOSIS — E11622 Type 2 diabetes mellitus with other skin ulcer: Secondary | ICD-10-CM | POA: Diagnosis not present

## 2019-08-13 DIAGNOSIS — L97829 Non-pressure chronic ulcer of other part of left lower leg with unspecified severity: Secondary | ICD-10-CM | POA: Diagnosis not present

## 2019-08-13 DIAGNOSIS — I509 Heart failure, unspecified: Secondary | ICD-10-CM | POA: Diagnosis not present

## 2019-08-13 DIAGNOSIS — J449 Chronic obstructive pulmonary disease, unspecified: Secondary | ICD-10-CM | POA: Diagnosis not present

## 2019-08-13 DIAGNOSIS — E1165 Type 2 diabetes mellitus with hyperglycemia: Secondary | ICD-10-CM | POA: Diagnosis not present

## 2019-08-13 DIAGNOSIS — E1142 Type 2 diabetes mellitus with diabetic polyneuropathy: Secondary | ICD-10-CM | POA: Diagnosis not present

## 2019-08-13 DIAGNOSIS — N184 Chronic kidney disease, stage 4 (severe): Secondary | ICD-10-CM | POA: Diagnosis not present

## 2019-08-14 DIAGNOSIS — J449 Chronic obstructive pulmonary disease, unspecified: Secondary | ICD-10-CM | POA: Diagnosis not present

## 2019-08-20 DIAGNOSIS — E1142 Type 2 diabetes mellitus with diabetic polyneuropathy: Secondary | ICD-10-CM | POA: Diagnosis not present

## 2019-08-20 DIAGNOSIS — N184 Chronic kidney disease, stage 4 (severe): Secondary | ICD-10-CM | POA: Diagnosis not present

## 2019-08-20 DIAGNOSIS — S81801A Unspecified open wound, right lower leg, initial encounter: Secondary | ICD-10-CM | POA: Diagnosis not present

## 2019-08-20 DIAGNOSIS — J449 Chronic obstructive pulmonary disease, unspecified: Secondary | ICD-10-CM | POA: Diagnosis not present

## 2019-08-20 DIAGNOSIS — E1165 Type 2 diabetes mellitus with hyperglycemia: Secondary | ICD-10-CM | POA: Diagnosis not present

## 2019-08-20 DIAGNOSIS — E11622 Type 2 diabetes mellitus with other skin ulcer: Secondary | ICD-10-CM | POA: Diagnosis not present

## 2019-08-20 DIAGNOSIS — I509 Heart failure, unspecified: Secondary | ICD-10-CM | POA: Diagnosis not present

## 2019-08-20 DIAGNOSIS — L97829 Non-pressure chronic ulcer of other part of left lower leg with unspecified severity: Secondary | ICD-10-CM | POA: Diagnosis not present

## 2019-08-20 DIAGNOSIS — E1122 Type 2 diabetes mellitus with diabetic chronic kidney disease: Secondary | ICD-10-CM | POA: Diagnosis not present

## 2019-08-20 DIAGNOSIS — I130 Hypertensive heart and chronic kidney disease with heart failure and stage 1 through stage 4 chronic kidney disease, or unspecified chronic kidney disease: Secondary | ICD-10-CM | POA: Diagnosis not present

## 2019-08-22 DIAGNOSIS — E1142 Type 2 diabetes mellitus with diabetic polyneuropathy: Secondary | ICD-10-CM | POA: Diagnosis not present

## 2019-08-22 DIAGNOSIS — I509 Heart failure, unspecified: Secondary | ICD-10-CM | POA: Diagnosis not present

## 2019-08-22 DIAGNOSIS — J449 Chronic obstructive pulmonary disease, unspecified: Secondary | ICD-10-CM | POA: Diagnosis not present

## 2019-08-22 DIAGNOSIS — N184 Chronic kidney disease, stage 4 (severe): Secondary | ICD-10-CM | POA: Diagnosis not present

## 2019-08-22 DIAGNOSIS — E1122 Type 2 diabetes mellitus with diabetic chronic kidney disease: Secondary | ICD-10-CM | POA: Diagnosis not present

## 2019-08-22 DIAGNOSIS — I130 Hypertensive heart and chronic kidney disease with heart failure and stage 1 through stage 4 chronic kidney disease, or unspecified chronic kidney disease: Secondary | ICD-10-CM | POA: Diagnosis not present

## 2019-08-22 DIAGNOSIS — E11622 Type 2 diabetes mellitus with other skin ulcer: Secondary | ICD-10-CM | POA: Diagnosis not present

## 2019-08-22 DIAGNOSIS — E1165 Type 2 diabetes mellitus with hyperglycemia: Secondary | ICD-10-CM | POA: Diagnosis not present

## 2019-08-22 DIAGNOSIS — L97829 Non-pressure chronic ulcer of other part of left lower leg with unspecified severity: Secondary | ICD-10-CM | POA: Diagnosis not present

## 2019-08-25 DIAGNOSIS — R072 Precordial pain: Secondary | ICD-10-CM | POA: Diagnosis not present

## 2019-08-25 DIAGNOSIS — I249 Acute ischemic heart disease, unspecified: Secondary | ICD-10-CM | POA: Diagnosis not present

## 2019-08-25 DIAGNOSIS — R079 Chest pain, unspecified: Secondary | ICD-10-CM | POA: Diagnosis not present

## 2019-08-25 DIAGNOSIS — E1165 Type 2 diabetes mellitus with hyperglycemia: Secondary | ICD-10-CM | POA: Diagnosis not present

## 2019-08-25 DIAGNOSIS — I2510 Atherosclerotic heart disease of native coronary artery without angina pectoris: Secondary | ICD-10-CM | POA: Diagnosis not present

## 2019-08-25 DIAGNOSIS — E785 Hyperlipidemia, unspecified: Secondary | ICD-10-CM | POA: Diagnosis not present

## 2019-08-25 DIAGNOSIS — J441 Chronic obstructive pulmonary disease with (acute) exacerbation: Secondary | ICD-10-CM | POA: Diagnosis not present

## 2019-08-25 DIAGNOSIS — I10 Essential (primary) hypertension: Secondary | ICD-10-CM | POA: Diagnosis not present

## 2019-08-25 DIAGNOSIS — Z03818 Encounter for observation for suspected exposure to other biological agents ruled out: Secondary | ICD-10-CM | POA: Diagnosis not present

## 2019-08-25 DIAGNOSIS — R0789 Other chest pain: Secondary | ICD-10-CM | POA: Diagnosis not present

## 2019-08-25 DIAGNOSIS — R000 Tachycardia, unspecified: Secondary | ICD-10-CM | POA: Diagnosis not present

## 2019-08-25 DIAGNOSIS — R0602 Shortness of breath: Secondary | ICD-10-CM | POA: Diagnosis not present

## 2019-08-25 DIAGNOSIS — N184 Chronic kidney disease, stage 4 (severe): Secondary | ICD-10-CM | POA: Diagnosis not present

## 2019-08-26 DIAGNOSIS — E1165 Type 2 diabetes mellitus with hyperglycemia: Secondary | ICD-10-CM | POA: Diagnosis not present

## 2019-08-26 DIAGNOSIS — R072 Precordial pain: Secondary | ICD-10-CM | POA: Diagnosis not present

## 2019-08-26 DIAGNOSIS — L97919 Non-pressure chronic ulcer of unspecified part of right lower leg with unspecified severity: Secondary | ICD-10-CM | POA: Diagnosis not present

## 2019-08-26 DIAGNOSIS — I10 Essential (primary) hypertension: Secondary | ICD-10-CM

## 2019-08-26 DIAGNOSIS — E785 Hyperlipidemia, unspecified: Secondary | ICD-10-CM | POA: Diagnosis not present

## 2019-08-26 DIAGNOSIS — D631 Anemia in chronic kidney disease: Secondary | ICD-10-CM | POA: Diagnosis not present

## 2019-08-26 DIAGNOSIS — R0789 Other chest pain: Secondary | ICD-10-CM | POA: Diagnosis not present

## 2019-08-26 DIAGNOSIS — J441 Chronic obstructive pulmonary disease with (acute) exacerbation: Secondary | ICD-10-CM | POA: Diagnosis not present

## 2019-08-26 DIAGNOSIS — R079 Chest pain, unspecified: Secondary | ICD-10-CM | POA: Diagnosis not present

## 2019-08-26 DIAGNOSIS — N184 Chronic kidney disease, stage 4 (severe): Secondary | ICD-10-CM

## 2019-08-26 DIAGNOSIS — L03115 Cellulitis of right lower limb: Secondary | ICD-10-CM | POA: Diagnosis not present

## 2019-08-26 DIAGNOSIS — R0602 Shortness of breath: Secondary | ICD-10-CM | POA: Diagnosis not present

## 2019-08-26 DIAGNOSIS — I4510 Unspecified right bundle-branch block: Secondary | ICD-10-CM | POA: Diagnosis not present

## 2019-08-26 DIAGNOSIS — I2510 Atherosclerotic heart disease of native coronary artery without angina pectoris: Secondary | ICD-10-CM | POA: Diagnosis not present

## 2019-08-26 DIAGNOSIS — E119 Type 2 diabetes mellitus without complications: Secondary | ICD-10-CM | POA: Diagnosis not present

## 2019-08-26 DIAGNOSIS — I249 Acute ischemic heart disease, unspecified: Secondary | ICD-10-CM | POA: Diagnosis not present

## 2019-08-26 DIAGNOSIS — Z03818 Encounter for observation for suspected exposure to other biological agents ruled out: Secondary | ICD-10-CM | POA: Diagnosis not present

## 2019-08-27 DIAGNOSIS — E785 Hyperlipidemia, unspecified: Secondary | ICD-10-CM | POA: Diagnosis not present

## 2019-08-27 DIAGNOSIS — I2510 Atherosclerotic heart disease of native coronary artery without angina pectoris: Secondary | ICD-10-CM | POA: Diagnosis not present

## 2019-08-27 DIAGNOSIS — J441 Chronic obstructive pulmonary disease with (acute) exacerbation: Secondary | ICD-10-CM | POA: Diagnosis not present

## 2019-08-27 DIAGNOSIS — R079 Chest pain, unspecified: Secondary | ICD-10-CM | POA: Diagnosis not present

## 2019-08-27 DIAGNOSIS — R0602 Shortness of breath: Secondary | ICD-10-CM | POA: Diagnosis not present

## 2019-08-28 DIAGNOSIS — R0602 Shortness of breath: Secondary | ICD-10-CM | POA: Diagnosis not present

## 2019-08-28 DIAGNOSIS — J441 Chronic obstructive pulmonary disease with (acute) exacerbation: Secondary | ICD-10-CM | POA: Diagnosis not present

## 2019-08-28 DIAGNOSIS — E785 Hyperlipidemia, unspecified: Secondary | ICD-10-CM | POA: Diagnosis not present

## 2019-08-28 DIAGNOSIS — I2510 Atherosclerotic heart disease of native coronary artery without angina pectoris: Secondary | ICD-10-CM | POA: Diagnosis not present

## 2019-08-29 DIAGNOSIS — I2510 Atherosclerotic heart disease of native coronary artery without angina pectoris: Secondary | ICD-10-CM | POA: Diagnosis not present

## 2019-08-29 DIAGNOSIS — J441 Chronic obstructive pulmonary disease with (acute) exacerbation: Secondary | ICD-10-CM | POA: Diagnosis not present

## 2019-08-29 DIAGNOSIS — E785 Hyperlipidemia, unspecified: Secondary | ICD-10-CM | POA: Diagnosis not present

## 2019-08-29 DIAGNOSIS — R0602 Shortness of breath: Secondary | ICD-10-CM | POA: Diagnosis not present

## 2019-08-30 DIAGNOSIS — I2510 Atherosclerotic heart disease of native coronary artery without angina pectoris: Secondary | ICD-10-CM | POA: Diagnosis not present

## 2019-08-30 DIAGNOSIS — E785 Hyperlipidemia, unspecified: Secondary | ICD-10-CM | POA: Diagnosis not present

## 2019-08-30 DIAGNOSIS — E119 Type 2 diabetes mellitus without complications: Secondary | ICD-10-CM | POA: Diagnosis not present

## 2019-08-31 ENCOUNTER — Encounter (HOSPITAL_COMMUNITY): Payer: Self-pay | Admitting: Cardiology

## 2019-08-31 ENCOUNTER — Inpatient Hospital Stay (HOSPITAL_COMMUNITY)
Admission: AD | Admit: 2019-08-31 | Discharge: 2019-09-05 | DRG: 287 | Disposition: A | Discharge status: Home Health | Payer: Medicare HMO | Source: Other Acute Inpatient Hospital | Attending: Internal Medicine | Admitting: Internal Medicine

## 2019-08-31 DIAGNOSIS — L03115 Cellulitis of right lower limb: Secondary | ICD-10-CM | POA: Diagnosis not present

## 2019-08-31 DIAGNOSIS — I4510 Unspecified right bundle-branch block: Secondary | ICD-10-CM | POA: Diagnosis present

## 2019-08-31 DIAGNOSIS — I249 Acute ischemic heart disease, unspecified: Principal | ICD-10-CM | POA: Diagnosis present

## 2019-08-31 DIAGNOSIS — K439 Ventral hernia without obstruction or gangrene: Secondary | ICD-10-CM | POA: Diagnosis present

## 2019-08-31 DIAGNOSIS — N1831 Chronic kidney disease, stage 3a: Secondary | ICD-10-CM | POA: Diagnosis present

## 2019-08-31 DIAGNOSIS — Z7982 Long term (current) use of aspirin: Secondary | ICD-10-CM

## 2019-08-31 DIAGNOSIS — R109 Unspecified abdominal pain: Secondary | ICD-10-CM

## 2019-08-31 DIAGNOSIS — F0390 Unspecified dementia without behavioral disturbance: Secondary | ICD-10-CM | POA: Diagnosis present

## 2019-08-31 DIAGNOSIS — L97919 Non-pressure chronic ulcer of unspecified part of right lower leg with unspecified severity: Secondary | ICD-10-CM | POA: Diagnosis not present

## 2019-08-31 DIAGNOSIS — G4733 Obstructive sleep apnea (adult) (pediatric): Secondary | ICD-10-CM | POA: Diagnosis present

## 2019-08-31 DIAGNOSIS — E119 Type 2 diabetes mellitus without complications: Secondary | ICD-10-CM | POA: Diagnosis not present

## 2019-08-31 DIAGNOSIS — E1122 Type 2 diabetes mellitus with diabetic chronic kidney disease: Secondary | ICD-10-CM | POA: Diagnosis present

## 2019-08-31 DIAGNOSIS — I2510 Atherosclerotic heart disease of native coronary artery without angina pectoris: Secondary | ICD-10-CM | POA: Diagnosis not present

## 2019-08-31 DIAGNOSIS — S79911A Unspecified injury of right hip, initial encounter: Secondary | ICD-10-CM | POA: Diagnosis not present

## 2019-08-31 DIAGNOSIS — Z79890 Hormone replacement therapy (postmenopausal): Secondary | ICD-10-CM

## 2019-08-31 DIAGNOSIS — I2582 Chronic total occlusion of coronary artery: Secondary | ICD-10-CM | POA: Diagnosis not present

## 2019-08-31 DIAGNOSIS — Z823 Family history of stroke: Secondary | ICD-10-CM

## 2019-08-31 DIAGNOSIS — G8929 Other chronic pain: Secondary | ICD-10-CM | POA: Diagnosis present

## 2019-08-31 DIAGNOSIS — E1140 Type 2 diabetes mellitus with diabetic neuropathy, unspecified: Secondary | ICD-10-CM | POA: Diagnosis present

## 2019-08-31 DIAGNOSIS — Z6839 Body mass index (BMI) 39.0-39.9, adult: Secondary | ICD-10-CM

## 2019-08-31 DIAGNOSIS — Z7902 Long term (current) use of antithrombotics/antiplatelets: Secondary | ICD-10-CM

## 2019-08-31 DIAGNOSIS — I69334 Monoplegia of upper limb following cerebral infarction affecting left non-dominant side: Secondary | ICD-10-CM

## 2019-08-31 DIAGNOSIS — E1165 Type 2 diabetes mellitus with hyperglycemia: Secondary | ICD-10-CM | POA: Diagnosis present

## 2019-08-31 DIAGNOSIS — Z8051 Family history of malignant neoplasm of kidney: Secondary | ICD-10-CM

## 2019-08-31 DIAGNOSIS — K429 Umbilical hernia without obstruction or gangrene: Secondary | ICD-10-CM | POA: Diagnosis present

## 2019-08-31 DIAGNOSIS — Z806 Family history of leukemia: Secondary | ICD-10-CM

## 2019-08-31 DIAGNOSIS — R310 Gross hematuria: Secondary | ICD-10-CM | POA: Diagnosis present

## 2019-08-31 DIAGNOSIS — K4090 Unilateral inguinal hernia, without obstruction or gangrene, not specified as recurrent: Secondary | ICD-10-CM | POA: Diagnosis present

## 2019-08-31 DIAGNOSIS — I252 Old myocardial infarction: Secondary | ICD-10-CM | POA: Diagnosis not present

## 2019-08-31 DIAGNOSIS — M25551 Pain in right hip: Secondary | ICD-10-CM | POA: Diagnosis not present

## 2019-08-31 DIAGNOSIS — N183 Chronic kidney disease, stage 3 unspecified: Secondary | ICD-10-CM

## 2019-08-31 DIAGNOSIS — Z9641 Presence of insulin pump (external) (internal): Secondary | ICD-10-CM | POA: Diagnosis present

## 2019-08-31 DIAGNOSIS — J441 Chronic obstructive pulmonary disease with (acute) exacerbation: Secondary | ICD-10-CM | POA: Diagnosis present

## 2019-08-31 DIAGNOSIS — E7800 Pure hypercholesterolemia, unspecified: Secondary | ICD-10-CM | POA: Diagnosis present

## 2019-08-31 DIAGNOSIS — W19XXXA Unspecified fall, initial encounter: Secondary | ICD-10-CM | POA: Diagnosis present

## 2019-08-31 DIAGNOSIS — E785 Hyperlipidemia, unspecified: Secondary | ICD-10-CM | POA: Diagnosis not present

## 2019-08-31 DIAGNOSIS — I10 Essential (primary) hypertension: Secondary | ICD-10-CM | POA: Diagnosis not present

## 2019-08-31 DIAGNOSIS — F40240 Claustrophobia: Secondary | ICD-10-CM | POA: Diagnosis present

## 2019-08-31 DIAGNOSIS — M549 Dorsalgia, unspecified: Secondary | ICD-10-CM | POA: Diagnosis present

## 2019-08-31 DIAGNOSIS — K219 Gastro-esophageal reflux disease without esophagitis: Secondary | ICD-10-CM | POA: Diagnosis present

## 2019-08-31 DIAGNOSIS — F329 Major depressive disorder, single episode, unspecified: Secondary | ICD-10-CM | POA: Diagnosis present

## 2019-08-31 DIAGNOSIS — I130 Hypertensive heart and chronic kidney disease with heart failure and stage 1 through stage 4 chronic kidney disease, or unspecified chronic kidney disease: Secondary | ICD-10-CM | POA: Diagnosis not present

## 2019-08-31 DIAGNOSIS — J449 Chronic obstructive pulmonary disease, unspecified: Secondary | ICD-10-CM | POA: Diagnosis present

## 2019-08-31 DIAGNOSIS — E782 Mixed hyperlipidemia: Secondary | ICD-10-CM | POA: Diagnosis present

## 2019-08-31 DIAGNOSIS — L02415 Cutaneous abscess of right lower limb: Secondary | ICD-10-CM | POA: Diagnosis not present

## 2019-08-31 DIAGNOSIS — Z955 Presence of coronary angioplasty implant and graft: Secondary | ICD-10-CM

## 2019-08-31 DIAGNOSIS — I5032 Chronic diastolic (congestive) heart failure: Secondary | ICD-10-CM | POA: Diagnosis not present

## 2019-08-31 DIAGNOSIS — Z79899 Other long term (current) drug therapy: Secondary | ICD-10-CM

## 2019-08-31 DIAGNOSIS — Z794 Long term (current) use of insulin: Secondary | ICD-10-CM

## 2019-08-31 DIAGNOSIS — I25110 Atherosclerotic heart disease of native coronary artery with unstable angina pectoris: Secondary | ICD-10-CM | POA: Diagnosis not present

## 2019-08-31 DIAGNOSIS — E1151 Type 2 diabetes mellitus with diabetic peripheral angiopathy without gangrene: Secondary | ICD-10-CM | POA: Diagnosis present

## 2019-08-31 DIAGNOSIS — Z79891 Long term (current) use of opiate analgesic: Secondary | ICD-10-CM

## 2019-08-31 DIAGNOSIS — Z9181 History of falling: Secondary | ICD-10-CM

## 2019-08-31 DIAGNOSIS — Z8249 Family history of ischemic heart disease and other diseases of the circulatory system: Secondary | ICD-10-CM

## 2019-08-31 HISTORY — DX: Personal history of pneumonia (recurrent): Z87.01

## 2019-08-31 HISTORY — DX: Essential (primary) hypertension: I10

## 2019-08-31 HISTORY — DX: Hyperlipidemia, unspecified: E78.5

## 2019-08-31 HISTORY — DX: Chronic kidney disease, stage 3 unspecified: N18.30

## 2019-08-31 LAB — GLUCOSE, CAPILLARY
Glucose-Capillary: 284 mg/dL — ABNORMAL HIGH (ref 70–99)
Glucose-Capillary: 271 mg/dL — ABNORMAL HIGH (ref 70–99)

## 2019-08-31 MED ORDER — METOPROLOL SUCCINATE ER 100 MG PO TB24
100.0000 mg | ORAL_TABLET | Freq: Every day | ORAL | Status: DC
  Administered 2019-09-01 – 2019-09-05 (×5): 100 mg via ORAL
  Filled 2019-08-31 (×5): qty 1

## 2019-08-31 MED ORDER — ASPIRIN 81 MG PO CHEW
81.0000 mg | CHEWABLE_TABLET | Freq: Every day | ORAL | Status: DC
  Administered 2019-09-01 – 2019-09-05 (×5): 81 mg via ORAL
  Filled 2019-08-31 (×5): qty 1

## 2019-08-31 MED ORDER — SODIUM CHLORIDE 0.9 % IV SOLN: 250.0000 mL | INTRAVENOUS | Status: DC | PRN

## 2019-08-31 MED ORDER — SODIUM CHLORIDE 0.9% FLUSH: 3.0000 mL | INTRAVENOUS | Status: DC | PRN

## 2019-08-31 MED ORDER — HYDROCODONE-ACETAMINOPHEN 7.5-325 MG PO TABS
1.0000 | ORAL_TABLET | Freq: Three times a day (TID) | ORAL | Status: DC | PRN
  Administered 2019-08-31 – 2019-09-04 (×8): 1 via ORAL
  Filled 2019-08-31 (×8): qty 1

## 2019-08-31 MED ORDER — INSULIN ASPART 100 UNIT/ML ~~LOC~~ SOLN
0.0000 [IU] | Freq: Every day | SUBCUTANEOUS | Status: DC
  Administered 2019-08-31 – 2019-09-03 (×4): 3 [IU] via SUBCUTANEOUS

## 2019-08-31 MED ORDER — ICOSAPENT ETHYL 1 G PO CAPS: 2.0000 | ORAL_CAPSULE | Freq: Two times a day (BID) | ORAL | Status: DC

## 2019-08-31 MED ORDER — PREGABALIN 75 MG PO CAPS
75.0000 mg | ORAL_CAPSULE | Freq: Two times a day (BID) | ORAL | Status: DC
  Administered 2019-08-31 – 2019-09-05 (×10): 75 mg via ORAL
  Filled 2019-08-31 (×2): qty 1
  Filled 2019-08-31: qty 3
  Filled 2019-08-31 (×7): qty 1

## 2019-08-31 MED ORDER — INSULIN ASPART 100 UNIT/ML ~~LOC~~ SOLN
0.0000 [IU] | Freq: Three times a day (TID) | SUBCUTANEOUS | Status: DC
  Administered 2019-08-31: 11 [IU] via SUBCUTANEOUS
  Administered 2019-09-01: 14:00:00 15 [IU] via SUBCUTANEOUS
  Administered 2019-09-01: 09:00:00 11 [IU] via SUBCUTANEOUS
  Administered 2019-09-01 – 2019-09-02 (×2): 15 [IU] via SUBCUTANEOUS
  Administered 2019-09-02: 08:00:00 7 [IU] via SUBCUTANEOUS
  Administered 2019-09-02 – 2019-09-03 (×3): 20 [IU] via SUBCUTANEOUS
  Administered 2019-09-03: 18:00:00 15 [IU] via SUBCUTANEOUS
  Administered 2019-09-04: 13:00:00 20 [IU] via SUBCUTANEOUS
  Administered 2019-09-04: 09:00:00 15 [IU] via SUBCUTANEOUS
  Administered 2019-09-04: 19:00:00 4 [IU] via SUBCUTANEOUS
  Administered 2019-09-05: 09:00:00 7 [IU] via SUBCUTANEOUS
  Administered 2019-09-05: 13:00:00 11 [IU] via SUBCUTANEOUS

## 2019-08-31 MED ORDER — SODIUM CHLORIDE 0.9% FLUSH
3.0000 mL | Freq: Two times a day (BID) | INTRAVENOUS | Status: DC
  Administered 2019-08-31 – 2019-09-03 (×3): 3 mL via INTRAVENOUS

## 2019-08-31 MED ORDER — ENOXAPARIN SODIUM 40 MG/0.4ML ~~LOC~~ SOLN
40.0000 mg | SUBCUTANEOUS | Status: DC
  Administered 2019-08-31: 40 mg via SUBCUTANEOUS
  Filled 2019-08-31: qty 0.4

## 2019-08-31 MED ORDER — AMLODIPINE BESYLATE 2.5 MG PO TABS
2.5000 mg | ORAL_TABLET | Freq: Every day | ORAL | Status: DC
  Administered 2019-08-31 – 2019-09-04 (×5): 2.5 mg via ORAL
  Filled 2019-08-31 (×5): qty 1

## 2019-08-31 MED ORDER — INSULIN REGULAR HUMAN (CONC) 500 UNIT/ML ~~LOC~~ SOPN
10.0000 [IU] | PEN_INJECTOR | Freq: Three times a day (TID) | SUBCUTANEOUS | Status: DC
  Administered 2019-09-01 – 2019-09-02 (×3): 10 [IU] via SUBCUTANEOUS
  Filled 2019-08-31: qty 3

## 2019-08-31 MED ORDER — CEPHALEXIN 500 MG PO CAPS
500.0000 mg | ORAL_CAPSULE | Freq: Two times a day (BID) | ORAL | Status: DC
  Administered 2019-08-31 – 2019-09-05 (×10): 500 mg via ORAL
  Filled 2019-08-31 (×10): qty 1

## 2019-08-31 MED ORDER — FOLIC ACID 1 MG PO TABS
1.0000 mg | ORAL_TABLET | Freq: Every day | ORAL | Status: DC
  Administered 2019-09-01 – 2019-09-05 (×5): 1 mg via ORAL
  Filled 2019-08-31 (×5): qty 1

## 2019-08-31 MED ORDER — CLOPIDOGREL BISULFATE 75 MG PO TABS
75.0000 mg | ORAL_TABLET | Freq: Every day | ORAL | Status: DC
  Administered 2019-09-01 – 2019-09-05 (×5): 75 mg via ORAL
  Filled 2019-08-31 (×5): qty 1

## 2019-08-31 MED ORDER — OMEGA-3-ACID ETHYL ESTERS 1 G PO CAPS
2.0000 g | ORAL_CAPSULE | Freq: Two times a day (BID) | ORAL | Status: DC
  Administered 2019-08-31 – 2019-09-05 (×10): 2 g via ORAL
  Filled 2019-08-31 (×10): qty 2

## 2019-08-31 MED ORDER — NITROGLYCERIN 0.4 MG SL SUBL: 0.4000 mg | SUBLINGUAL_TABLET | SUBLINGUAL | Status: DC | PRN

## 2019-08-31 MED ORDER — ONDANSETRON HCL 4 MG/2ML IJ SOLN
4.0000 mg | Freq: Four times a day (QID) | INTRAMUSCULAR | Status: DC | PRN
  Administered 2019-09-02: 08:00:00 4 mg via INTRAVENOUS
  Filled 2019-08-31: qty 2

## 2019-08-31 MED ORDER — FAMOTIDINE 20 MG PO TABS
20.0000 mg | ORAL_TABLET | Freq: Every day | ORAL | Status: DC
  Administered 2019-09-01 – 2019-09-05 (×5): 20 mg via ORAL
  Filled 2019-08-31 (×5): qty 1

## 2019-08-31 MED ORDER — ACETAMINOPHEN 325 MG PO TABS
650.0000 mg | ORAL_TABLET | ORAL | Status: DC | PRN
  Administered 2019-09-02: 08:00:00 650 mg via ORAL
  Filled 2019-08-31: qty 2

## 2019-08-31 MED ORDER — ROSUVASTATIN CALCIUM 20 MG PO TABS
20.0000 mg | ORAL_TABLET | Freq: Every day | ORAL | Status: DC
  Administered 2019-09-01 – 2019-09-05 (×5): 20 mg via ORAL
  Filled 2019-08-31 (×5): qty 1

## 2019-08-31 MED ORDER — QUETIAPINE FUMARATE 50 MG PO TABS
300.0000 mg | ORAL_TABLET | Freq: Every day | ORAL | Status: DC
  Administered 2019-08-31 – 2019-09-04 (×5): 300 mg via ORAL
  Filled 2019-08-31 (×5): qty 6
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
## 2019-09-01 ENCOUNTER — Encounter (HOSPITAL_COMMUNITY): Payer: Self-pay | Admitting: Internal Medicine

## 2019-09-01 ENCOUNTER — Encounter (HOSPITAL_COMMUNITY)
Admission: AD | Disposition: A | Discharge status: Home Health | Payer: Self-pay | Source: Other Acute Inpatient Hospital | Attending: Internal Medicine

## 2019-09-01 DIAGNOSIS — I2510 Atherosclerotic heart disease of native coronary artery without angina pectoris: Secondary | ICD-10-CM

## 2019-09-01 DIAGNOSIS — I249 Acute ischemic heart disease, unspecified: Principal | ICD-10-CM

## 2019-09-01 DIAGNOSIS — I10 Essential (primary) hypertension: Secondary | ICD-10-CM

## 2019-09-01 DIAGNOSIS — E785 Hyperlipidemia, unspecified: Secondary | ICD-10-CM

## 2019-09-01 HISTORY — PX: LEFT HEART CATH AND CORONARY ANGIOGRAPHY: CATH118249

## 2019-09-01 LAB — HIV ANTIBODY (ROUTINE TESTING W REFLEX): HIV Screen 4th Generation wRfx: NONREACTIVE

## 2019-09-01 LAB — BASIC METABOLIC PANEL
Anion gap: 9 (ref 5–15)
BUN: 24 mg/dL — ABNORMAL HIGH (ref 8–23)
CO2: 28 mmol/L (ref 22–32)
Calcium: 9.4 mg/dL (ref 8.9–10.3)
Chloride: 100 mmol/L (ref 98–111)
Creatinine, Ser: 1.35 mg/dL — ABNORMAL HIGH (ref 0.61–1.24)
GFR calc Af Amer: >60 mL/min (ref >60)
GFR calc non Af Amer: 54 mL/min — ABNORMAL LOW (ref >60)
Glucose, Bld: 328 mg/dL — ABNORMAL HIGH (ref 70–99)
Potassium: 4.6 mmol/L (ref 3.5–5.1)
Sodium: 137 mmol/L (ref 135–145)

## 2019-09-01 LAB — CBC
HCT: 39.8 % (ref 39.0–52.0)
Hemoglobin: 13 g/dL (ref 13.0–17.0)
MCH: 29.5 pg (ref 26.0–34.0)
MCHC: 32.7 g/dL (ref 30.0–36.0)
MCV: 90.5 fL (ref 80.0–100.0)
Platelets: 153 10*3/uL (ref 150–400)
RBC: 4.4 MIL/uL (ref 4.22–5.81)
RDW: 14.6 % (ref 11.5–15.5)
WBC: 5.8 10*3/uL (ref 4.0–10.5)
nRBC: 0 % (ref 0.0–0.2)

## 2019-09-01 LAB — GLUCOSE, CAPILLARY
Glucose-Capillary: 292 mg/dL — ABNORMAL HIGH (ref 70–99)
Glucose-Capillary: 331 mg/dL — ABNORMAL HIGH (ref 70–99)
Glucose-Capillary: 341 mg/dL — ABNORMAL HIGH (ref 70–99)
Glucose-Capillary: 232 mg/dL — ABNORMAL HIGH (ref 70–99)

## 2019-09-01 LAB — HEMOGLOBIN A1C
Hgb A1c MFr Bld: 9.3 % — ABNORMAL HIGH (ref 4.8–5.6)
Mean Plasma Glucose: 220.21 mg/dL

## 2019-09-01 LAB — PROTIME-INR
INR: 1.1 (ref 0.8–1.2)
Prothrombin Time: 14.1 seconds (ref 11.4–15.2)

## 2019-09-01 SURGERY — LEFT HEART CATH AND CORONARY ANGIOGRAPHY
Anesthesia: LOCAL

## 2019-09-01 MED ORDER — MIDAZOLAM HCL 2 MG/2ML IJ SOLN
INTRAMUSCULAR | Status: AC
  Filled 2019-09-01: qty 2

## 2019-09-01 MED ORDER — FENTANYL CITRATE (PF) 100 MCG/2ML IJ SOLN
INTRAMUSCULAR | Status: DC | PRN
  Administered 2019-09-01: 25 ug via INTRAVENOUS

## 2019-09-01 MED ORDER — VERAPAMIL HCL 2.5 MG/ML IV SOLN
INTRAVENOUS | Status: AC
  Filled 2019-09-01: qty 2

## 2019-09-01 MED ORDER — FENTANYL CITRATE (PF) 100 MCG/2ML IJ SOLN
INTRAMUSCULAR | Status: AC
  Filled 2019-09-01: qty 2

## 2019-09-01 MED ORDER — SODIUM CHLORIDE 0.9% FLUSH: 3.0000 mL | INTRAVENOUS | Status: DC | PRN

## 2019-09-01 MED ORDER — SODIUM CHLORIDE 0.9 % IV SOLN: 250.0000 mL | INTRAVENOUS | Status: DC | PRN

## 2019-09-01 MED ORDER — HEPARIN SODIUM (PORCINE) 1000 UNIT/ML IJ SOLN
INTRAMUSCULAR | Status: DC | PRN
  Administered 2019-09-01: 5000 [IU] via INTRAVENOUS

## 2019-09-01 MED ORDER — SODIUM CHLORIDE 0.9% FLUSH
3.0000 mL | Freq: Two times a day (BID) | INTRAVENOUS | Status: DC
  Administered 2019-09-02 – 2019-09-05 (×5): 3 mL via INTRAVENOUS

## 2019-09-01 MED ORDER — LABETALOL HCL 5 MG/ML IV SOLN: 10.0000 mg | INTRAVENOUS | Status: AC | PRN

## 2019-09-01 MED ORDER — ENOXAPARIN SODIUM 40 MG/0.4ML ~~LOC~~ SOLN
40.0000 mg | SUBCUTANEOUS | Status: DC
  Administered 2019-09-02 – 2019-09-05 (×4): 40 mg via SUBCUTANEOUS
  Filled 2019-09-01 (×4): qty 0.4

## 2019-09-01 MED ORDER — IOHEXOL 350 MG/ML SOLN
INTRAVENOUS | Status: DC | PRN
  Administered 2019-09-01: 32 mL

## 2019-09-01 MED ORDER — SODIUM CHLORIDE 0.9% FLUSH: 3.0000 mL | Freq: Two times a day (BID) | INTRAVENOUS | Status: DC

## 2019-09-01 MED ORDER — HEPARIN SODIUM (PORCINE) 1000 UNIT/ML IJ SOLN
INTRAMUSCULAR | Status: AC
  Filled 2019-09-01: qty 1

## 2019-09-01 MED ORDER — SODIUM CHLORIDE 0.9 % IV SOLN
INTRAVENOUS | Status: DC
  Administered 2019-09-01: 07:00:00 via INTRAVENOUS

## 2019-09-01 MED ORDER — SODIUM CHLORIDE 0.9 % WEIGHT BASED INFUSION: 3.0000 mL/kg/h | INTRAVENOUS | Status: DC

## 2019-09-01 MED ORDER — LIDOCAINE HCL (PF) 1 % IJ SOLN
INTRAMUSCULAR | Status: DC | PRN
  Administered 2019-09-01: 2 mL

## 2019-09-01 MED ORDER — LIDOCAINE HCL (PF) 1 % IJ SOLN
INTRAMUSCULAR | Status: AC
  Filled 2019-09-01: qty 30

## 2019-09-01 MED ORDER — SODIUM CHLORIDE 0.9 % WEIGHT BASED INFUSION: 1.0000 mL/kg/h | INTRAVENOUS | Status: DC

## 2019-09-01 MED ORDER — HEPARIN (PORCINE) IN NACL 1000-0.9 UT/500ML-% IV SOLN
INTRAVENOUS | Status: AC
  Filled 2019-09-01: qty 1000

## 2019-09-01 MED ORDER — HYDRALAZINE HCL 20 MG/ML IJ SOLN: 10.0000 mg | INTRAMUSCULAR | Status: AC | PRN

## 2019-09-01 MED ORDER — HEPARIN (PORCINE) IN NACL 1000-0.9 UT/500ML-% IV SOLN
INTRAVENOUS | Status: DC | PRN
  Administered 2019-09-01 (×2): 500 mL

## 2019-09-01 MED ORDER — MIDAZOLAM HCL 2 MG/2ML IJ SOLN
INTRAMUSCULAR | Status: DC | PRN
  Administered 2019-09-01: 1 mg via INTRAVENOUS

## 2019-09-01 MED ORDER — VERAPAMIL HCL 2.5 MG/ML IV SOLN
INTRAVENOUS | Status: DC | PRN
  Administered 2019-09-01: 10 mL via INTRA_ARTERIAL

## 2019-09-01 MED ORDER — ASPIRIN 81 MG PO CHEW: 81.0000 mg | CHEWABLE_TABLET | ORAL | Status: AC

## 2019-09-01 SURGICAL SUPPLY — 12 items
CATH INFINITI 5 FR JL3.5 (CATHETERS) ×2 IMPLANT
CATH OPTITORQUE TIG 4.0 5F (CATHETERS) ×2 IMPLANT
DEVICE RAD COMP TR BAND LRG (VASCULAR PRODUCTS) ×2 IMPLANT
GLIDESHEATH SLEND SS 6F .021 (SHEATH) ×2 IMPLANT
GUIDEWIRE INQWIRE 1.5J.035X260 (WIRE) ×1 IMPLANT
HOVERMATT SINGLE USE (MISCELLANEOUS) ×2 IMPLANT
INQWIRE 1.5J .035X260CM (WIRE) ×2
KIT HEART LEFT (KITS) ×2 IMPLANT
PACK CARDIAC CATHETERIZATION (CUSTOM PROCEDURE TRAY) ×2 IMPLANT
SHEATH PROBE COVER 6X72 (BAG) ×2 IMPLANT
TRANSDUCER W/STOPCOCK (MISCELLANEOUS) ×2 IMPLANT
TUBING CIL FLEX 10 FLL-RA (TUBING) ×2 IMPLANT

## 2019-09-01 NOTE — Interval H&P Note (Signed)
History and Physical Interval Note:  09/01/2019 11:07 AM  Reginald Duncan  has presented today for cardiac catheterization, with the diagnosis of chest pain and abnormal stress test.  The various methods of treatment have been discussed with the patient and family. After consideration of risks, benefits and other options for treatment, the patient has consented to  Procedure(s): LEFT HEART CATH AND CORONARY ANGIOGRAPHY (N/A) as a surgical intervention.  The patient's history has been reviewed, patient examined, no change in status, stable for surgery.  I have reviewed the patient's chart and labs.  Questions were answered to the patient's satisfaction.    Cath Lab Visit (complete for each Cath Lab visit)  Clinical Evaluation Leading to the Procedure:   ACS: Yes.    Non-ACS:    Anginal Classification: CCS IV  Anti-ischemic medical therapy: Maximal Therapy (2 or more classes of medications)  Non-Invasive Test Results: Intermediate-risk stress test findings: cardiac mortality 1-3%/year  Prior CABG: No previous CABG  Reginald Duncan

## 2019-09-01 NOTE — Progress Notes (Signed)
Progress Note  Patient Name: Reginald Duncan Date of Encounter: 09/01/2019  Primary Cardiologist: Dorris Carnes, MD   Subjective   No CP or dyspnea  Inpatient Medications    Scheduled Meds: . amLODipine  2.5 mg Oral QHS  . aspirin  81 mg Oral Daily  . cephALEXin  500 mg Oral Q12H  . clopidogrel  75 mg Oral Daily  . enoxaparin (LOVENOX) injection  40 mg Subcutaneous Q24H  . famotidine  20 mg Oral Daily  . folic acid  1 mg Oral Daily  . insulin aspart  0-20 Units Subcutaneous TID WC  . insulin aspart  0-5 Units Subcutaneous QHS  . insulin regular human CONCENTRATED  10 Units Subcutaneous TID WC  . metoprolol succinate  100 mg Oral Daily  . omega-3 acid ethyl esters  2 g Oral BID WC  . pregabalin  75 mg Oral BID  . QUEtiapine  300 mg Oral QHS  . rosuvastatin  20 mg Oral Daily  . sodium chloride flush  3 mL Intravenous Q12H   Continuous Infusions: . sodium chloride    . sodium chloride 75 mL/hr at 09/01/19 0710   PRN Meds: sodium chloride, acetaminophen, HYDROcodone-acetaminophen, nitroGLYCERIN, ondansetron (ZOFRAN) IV, sodium chloride flush   Vital Signs    Vitals:   08/31/19 1521 08/31/19 1524 08/31/19 2018 09/01/19 0330  BP: (!) 142/70  (!) 146/86 138/84  Pulse:  80 79 83  Resp:   18   Temp:  97.7 F (36.5 C) 98.7 F (37.1 C) (!) 97.4 F (36.3 C)  TempSrc:  Oral Oral Oral  SpO2: 100%  94% 96%  Weight:    134.6 kg  Height:  6\' 1"  (1.854 m)      Intake/Output Summary (Last 24 hours) at 09/01/2019 0844 Last data filed at 09/01/2019 0800 Gross per 24 hour  Intake 60.38 ml  Output 2080 ml  Net -2019.62 ml   Last 3 Weights 09/01/2019 10/11/2018 08/15/2018  Weight (lbs) 296 lb 11.8 oz 298 lb 1.9 oz 297 lb 4.8 oz  Weight (kg) 134.6 kg 135.226 kg 134.854 kg      Telemetry    Sinus- Personally Reviewed   Physical Exam   GEN: No acute distress.   Neck: No JVD Cardiac: RRR, no murmurs, rubs, or gallops.  Respiratory: Clear to auscultation bilaterally. GI:  Soft, nontender, non-distended  MS: No edema Neuro:  Nonfocal  Psych: Normal affect   Labs    Chemistry Recent Labs  Lab 09/01/19 0403  NA 137  K 4.6  CL 100  CO2 28  GLUCOSE 328*  BUN 24*  CREATININE 1.35*  CALCIUM 9.4  GFRNONAA 54*  GFRAA >60  ANIONGAP 9     Hematology Recent Labs  Lab 09/01/19 0403  WBC 5.8  RBC 4.40  HGB 13.0  HCT 39.8  MCV 90.5  MCH 29.5  MCHC 32.7  RDW 14.6  PLT 153    Patient Profile     68 y.o. male with past medical history of coronary artery disease, sleep apnea, diabetes mellitus, hypertension, hyperlipidemia, peripheral vascular disease, COPD, chronic stage III kidney disease with chest pain and abnormal nuclear study.  Patient transferred from [facility] on November 8 following admission for chest pain, COPD exacerbation, right lower extremity cellulitis.  Nuclear study November 4 showed inferior ischemia and ejection fraction 72%.  Echocardiogram showed normal LV function.  Assessment & Plan    1 recent chest pain with abnormal nuclear study-plan is for cardiac catheterization and  we will arrange this for today.  The risks and benefits including myocardial infarction, CVA and death discussed and he agrees to proceed.  He does have chronic stage III kidney disease.  He is being hydrated prior to procedure.  Follow renal function afterwards.  2 coronary artery disease-continue aspirin, Plavix and statin.  3 hypertension-continue present blood pressure medications.  Follow and adjust regimen as needed.  4 hyperlipidemia-continue statin.  5 chronic stage III kidney disease-follow renal function after catheterization.  6 diabetes mellitus-Per primary care.  7 COPD-Per primary care.  For questions or updates, please contact [place] Please consult [email or web address] for contact info under        Signed, Kirk Ruths, MD  09/01/2019, 8:44 AM

## 2019-09-01 NOTE — Progress Notes (Signed)
Patient noted with scattered scabbed areas to bilateral lower extremity. No drainage noted. Unable to measure.

## 2019-09-01 NOTE — H&P (View-Only) (Signed)
Progress Note  Patient Name: Reginald Duncan Date of Encounter: 09/01/2019  Primary Cardiologist: Dorris Carnes, MD   Subjective   No CP or dyspnea  Inpatient Medications    Scheduled Meds: . amLODipine  2.5 mg Oral QHS  . aspirin  81 mg Oral Daily  . cephALEXin  500 mg Oral Q12H  . clopidogrel  75 mg Oral Daily  . enoxaparin (LOVENOX) injection  40 mg Subcutaneous Q24H  . famotidine  20 mg Oral Daily  . folic acid  1 mg Oral Daily  . insulin aspart  0-20 Units Subcutaneous TID WC  . insulin aspart  0-5 Units Subcutaneous QHS  . insulin regular human CONCENTRATED  10 Units Subcutaneous TID WC  . metoprolol succinate  100 mg Oral Daily  . omega-3 acid ethyl esters  2 g Oral BID WC  . pregabalin  75 mg Oral BID  . QUEtiapine  300 mg Oral QHS  . rosuvastatin  20 mg Oral Daily  . sodium chloride flush  3 mL Intravenous Q12H   Continuous Infusions: . sodium chloride    . sodium chloride 75 mL/hr at 09/01/19 0710   PRN Meds: sodium chloride, acetaminophen, HYDROcodone-acetaminophen, nitroGLYCERIN, ondansetron (ZOFRAN) IV, sodium chloride flush   Vital Signs    Vitals:   08/31/19 1521 08/31/19 1524 08/31/19 2018 09/01/19 0330  BP: (!) 142/70  (!) 146/86 138/84  Pulse:  80 79 83  Resp:   18   Temp:  97.7 F (36.5 C) 98.7 F (37.1 C) (!) 97.4 F (36.3 C)  TempSrc:  Oral Oral Oral  SpO2: 100%  94% 96%  Weight:    134.6 kg  Height:  6\' 1"  (1.854 m)      Intake/Output Summary (Last 24 hours) at 09/01/2019 0844 Last data filed at 09/01/2019 0800 Gross per 24 hour  Intake 60.38 ml  Output 2080 ml  Net -2019.62 ml   Last 3 Weights 09/01/2019 10/11/2018 08/15/2018  Weight (lbs) 296 lb 11.8 oz 298 lb 1.9 oz 297 lb 4.8 oz  Weight (kg) 134.6 kg 135.226 kg 134.854 kg      Telemetry    Sinus- Personally Reviewed   Physical Exam   GEN: No acute distress.   Neck: No JVD Cardiac: RRR, no murmurs, rubs, or gallops.  Respiratory: Clear to auscultation bilaterally. GI:  Soft, nontender, non-distended  MS: No edema Neuro:  Nonfocal  Psych: Normal affect   Labs    Chemistry Recent Labs  Lab 09/01/19 0403  NA 137  K 4.6  CL 100  CO2 28  GLUCOSE 328*  BUN 24*  CREATININE 1.35*  CALCIUM 9.4  GFRNONAA 54*  GFRAA >60  ANIONGAP 9     Hematology Recent Labs  Lab 09/01/19 0403  WBC 5.8  RBC 4.40  HGB 13.0  HCT 39.8  MCV 90.5  MCH 29.5  MCHC 32.7  RDW 14.6  PLT 153    Patient Profile     68 y.o. male with past medical history of coronary artery disease, sleep apnea, diabetes mellitus, hypertension, hyperlipidemia, peripheral vascular disease, COPD, chronic stage III kidney disease with chest pain and abnormal nuclear study.  Patient transferred from [facility] on November 8 following admission for chest pain, COPD exacerbation, right lower extremity cellulitis.  Nuclear study November 4 showed inferior ischemia and ejection fraction 72%.  Echocardiogram showed normal LV function.  Assessment & Plan    1 recent chest pain with abnormal nuclear study-plan is for cardiac catheterization and  we will arrange this for today.  The risks and benefits including myocardial infarction, CVA and death discussed and he agrees to proceed.  He does have chronic stage III kidney disease.  He is being hydrated prior to procedure.  Follow renal function afterwards.  2 coronary artery disease-continue aspirin, Plavix and statin.  3 hypertension-continue present blood pressure medications.  Follow and adjust regimen as needed.  4 hyperlipidemia-continue statin.  5 chronic stage III kidney disease-follow renal function after catheterization.  6 diabetes mellitus-Per primary care.  7 COPD-Per primary care.  For questions or updates, please contact [place] Please consult [email or web address] for contact info under        Signed, Kirk Ruths, MD  09/01/2019, 8:44 AM
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
## 2019-09-01 NOTE — Progress Notes (Signed)
Inpatient Diabetes Program Recommendations  AACE/ADA: New Consensus Statement on Inpatient Glycemic Control (2015)  Target Ranges:  Prepandial:   less than 140 mg/dL      Peak postprandial:   less than 180 mg/dL (1-2 hours)      Critically ill patients:  140 - 180 mg/dL   Lab Results  Component Value Date   GLUCAP 292 (H) 09/01/2019   HGBA1C 9.3 (H) 09/01/2019    Review of Glycemic Control  Diabetes history: DM 2 Outpatient Diabetes medications: Humulin R U-500 10 units breakfast, 15 units lunch, 10 units supper, 10 units bedtime around 11 pm. Current orders for Inpatient glycemic control:  Humulin R U-500 10 units tid, Novolog 0-20 units tid + hs  A1c 9.3%  Inpatient Diabetes Program Recommendations:    Spoke w/pt in regards to A1c level and glucose control at home. Pt reports being at [facility] all week last week and his glucose was ranging >250 the whole time. Pt reports normally his A1c is around a 7% but because they refused to place him on his insulin that worked that drove his A1c up. However. Pt also reports occasional high glucose levels at home.  Patient reflected on his past a lot in regards to him not taking care of himself. Patient also very nervous about cardiac cath.  PCP manages patient. He may need titration and possibly endocrinologist in the future however resources limited in [facility] as far as a specialist to go to.  Thanks,  Tama Headings RN, MSN, BC-ADM Inpatient Diabetes Coordinator Team Pager [phone] (8a-5p)

## 2019-09-02 ENCOUNTER — Inpatient Hospital Stay (HOSPITAL_COMMUNITY): Payer: Medicare HMO

## 2019-09-02 ENCOUNTER — Encounter (HOSPITAL_COMMUNITY): Payer: Self-pay | Admitting: General Practice

## 2019-09-02 LAB — BASIC METABOLIC PANEL
Anion gap: 9 (ref 5–15)
BUN: 23 mg/dL (ref 8–23)
CO2: 27 mmol/L (ref 22–32)
Calcium: 9.6 mg/dL (ref 8.9–10.3)
Chloride: 101 mmol/L (ref 98–111)
Creatinine, Ser: 1.39 mg/dL — ABNORMAL HIGH (ref 0.61–1.24)
GFR calc Af Amer: >60 mL/min (ref >60)
GFR calc non Af Amer: 52 mL/min — ABNORMAL LOW (ref >60)
Glucose, Bld: 261 mg/dL — ABNORMAL HIGH (ref 70–99)
Potassium: 4.5 mmol/L (ref 3.5–5.1)
Sodium: 137 mmol/L (ref 135–145)

## 2019-09-02 LAB — GLUCOSE, CAPILLARY
Glucose-Capillary: 250 mg/dL — ABNORMAL HIGH (ref 70–99)
Glucose-Capillary: 410 mg/dL — ABNORMAL HIGH (ref 70–99)
Glucose-Capillary: 319 mg/dL — ABNORMAL HIGH (ref 70–99)
Glucose-Capillary: 289 mg/dL — ABNORMAL HIGH (ref 70–99)

## 2019-09-02 LAB — GLUCOSE, RANDOM: Glucose, Bld: 446 mg/dL — ABNORMAL HIGH (ref 70–99)

## 2019-09-02 MED ORDER — FUROSEMIDE 20 MG PO TABS
20.0000 mg | ORAL_TABLET | Freq: Every day | ORAL | Status: DC
  Administered 2019-09-02 – 2019-09-04 (×3): 20 mg via ORAL
  Filled 2019-09-02 (×3): qty 1

## 2019-09-02 MED ORDER — INSULIN GLARGINE 100 UNIT/ML ~~LOC~~ SOLN
7.0000 [IU] | Freq: Two times a day (BID) | SUBCUTANEOUS | Status: DC
  Administered 2019-09-02 – 2019-09-03 (×3): 7 [IU] via SUBCUTANEOUS
  Filled 2019-09-02 (×5): qty 0.07

## 2019-09-02 NOTE — Plan of Care (Signed)
  Problem: Education: Goal: Knowledge of General Education information will improve Description: Including pain rating scale, medication(s)/side effects and non-pharmacologic comfort measures Outcome: Progressing   Problem: Health Behavior/Discharge Planning: Goal: Ability to manage health-related needs will improve Outcome: Progressing   Problem: Clinical Measurements: Goal: Ability to maintain clinical measurements within normal limits will improve Outcome: Progressing   Problem: Coping: Goal: Level of anxiety will decrease Outcome: Progressing   Problem: Elimination: Goal: Will not experience complications related to bowel motility Outcome: Progressing   Problem: Safety: Goal: Ability to remain free from injury will improve Outcome: Progressing   

## 2019-09-02 NOTE — Progress Notes (Signed)
Patient stated having right lower flank pain, when asked if it was new he said yes. Patient has hx of abdominal hernia. Given hydrocodone, with some relief. Paged [facility], will continue to monitor patient.

## 2019-09-02 NOTE — Progress Notes (Signed)
Progress Note  Patient Name: Reginald Duncan Date of Encounter: 09/02/2019  Primary Cardiologist: Dorris Carnes, MD   Subjective   Pt denies CP and dyspnea; complains of abdominal pain and constipation  Inpatient Medications    Scheduled Meds: . amLODipine  2.5 mg Oral QHS  . aspirin  81 mg Oral Daily  . cephALEXin  500 mg Oral Q12H  . clopidogrel  75 mg Oral Daily  . enoxaparin (LOVENOX) injection  40 mg Subcutaneous Q24H  . famotidine  20 mg Oral Daily  . folic acid  1 mg Oral Daily  . insulin aspart  0-20 Units Subcutaneous TID WC  . insulin aspart  0-5 Units Subcutaneous QHS  . insulin regular human CONCENTRATED  10 Units Subcutaneous TID WC  . metoprolol succinate  100 mg Oral Daily  . omega-3 acid ethyl esters  2 g Oral BID WC  . pregabalin  75 mg Oral BID  . QUEtiapine  300 mg Oral QHS  . rosuvastatin  20 mg Oral Daily  . sodium chloride flush  3 mL Intravenous Q12H  . sodium chloride flush  3 mL Intravenous Q12H   Continuous Infusions: . sodium chloride    . sodium chloride 10 mL/hr at 09/01/19 1342   PRN Meds: sodium chloride, sodium chloride, acetaminophen, HYDROcodone-acetaminophen, nitroGLYCERIN, ondansetron (ZOFRAN) IV, sodium chloride flush, sodium chloride flush   Vital Signs    Vitals:   09/01/19 1355 09/01/19 1425 09/01/19 2054 09/02/19 0549  BP: 97/63 133/78 (!) 144/76 (!) 158/79  Pulse:   73 83  Resp: 13  12 15   Temp:   97.7 F (36.5 C) 98 F (36.7 C)  TempSrc:   Oral Oral  SpO2:   96% 97%  Weight:    132.6 kg  Height:        Intake/Output Summary (Last 24 hours) at 09/02/2019 0809 Last data filed at 09/02/2019 0550 Gross per 24 hour  Intake 289.57 ml  Output 2250 ml  Net -1960.43 ml   Last 3 Weights 09/02/2019 09/01/2019 10/11/2018  Weight (lbs) 292 lb 6.4 oz 296 lb 11.8 oz 298 lb 1.9 oz  Weight (kg) 132.632 kg 134.6 kg 135.226 kg      Telemetry    Sinus- Personally Reviewed   Physical Exam   GEN: WD, NAD Neck: Supple  Cardiac: RRR Respiratory: CTA GI: mildly distended; mild tenderness to palpation MS: No edema; radial cath site with no hematoma Neuro:  Grossly intact   Labs    Chemistry Recent Labs  Lab 09/01/19 0403 09/02/19 0344  NA 137 137  K 4.6 4.5  CL 100 101  CO2 28 27  GLUCOSE 328* 261*  BUN 24* 23  CREATININE 1.35* 1.39*  CALCIUM 9.4 9.6  GFRNONAA 54* 52*  GFRAA >60 >60  ANIONGAP 9 9     Hematology Recent Labs  Lab 09/01/19 0403  WBC 5.8  RBC 4.40  HGB 13.0  HCT 39.8  MCV 90.5  MCH 29.5  MCHC 32.7  RDW 14.6  PLT 153    Patient Profile     68 y.o. male with past medical history of coronary artery disease, sleep apnea, diabetes mellitus, hypertension, hyperlipidemia, peripheral vascular disease, COPD, chronic stage III kidney disease with chest pain and abnormal nuclear study.  Patient transferred from [facility] on November 8 following admission for chest pain, COPD exacerbation, right lower extremity cellulitis.  Nuclear study November 4 showed inferior ischemia and ejection fraction 72%.  Echocardiogram showed normal LV function.  Assessment & Plan    1 CAD/recent chest pain with abnormal nuclear study-cath results noted.  Plan is for medical therapy.  Continue aspirin, Plavix, toprol and statin.  2 Chronic diastolic CHF-LVEDP elevated at time of cath; add lasix 20 mg daily; check bmet one week.  3 hypertension-BP elevated; will add lasix for diastolic CHF and follow.  4 hyperlipidemia-continue statin.  5 chronic stage III kidney disease-recheck bmet one week.  6 Abdominal pain-pt attibutes to hernias; has occasional pain; further evaluation per primary care.  Pt can be DCed from a cardiac standpoint once abdominal pain evaluated.  He should have transition of care follow-up appointment with APP 1 week following discharge.  Follow-up Dr. Harrington Challenger 3 months.  For questions or updates, please contact [place] Please consult [email or web address] for contact  info under        Signed, Kirk Ruths, MD  09/02/2019, 8:09 AM

## 2019-09-02 NOTE — Progress Notes (Addendum)
Inpatient Diabetes Program Recommendations  AACE/ADA: New Consensus Statement on Inpatient Glycemic Control (2015)  Target Ranges:  Prepandial:   less than 140 mg/dL      Peak postprandial:   less than 180 mg/dL (1-2 hours)      Critically ill patients:  140 - 180 mg/dL   Lab Results  Component Value Date   GLUCAP 250 (H) 09/02/2019   HGBA1C 9.3 (H) 09/01/2019    Review of Glycemic Control Results for Reginald Duncan, Reginald Duncan (MRN [id number]) as of 09/02/2019 10:13  Ref. Range 09/01/2019 08:29 09/01/2019 13:39 09/01/2019 17:04 09/01/2019 20:52 09/02/2019 07:57  Glucose-Capillary Latest Ref Range: 70 - 99 mg/dL 292 (H) 331 (H) 341 (H) 232 (H) 250 (H)   Diabetes history: DM 2 Outpatient Diabetes medications: Humulin R U-500 10 units breakfast, 15 units lunch, 10 units supper, 10 units bedtime around 11 pm. Current orders for Inpatient glycemic control:  Humulin R U-500 10 units tid, Novolog 0-20 units tid + hs  A1c 9.3%  Inpatient Diabetes Program Recommendations:    Patient takes Humulin R U-500 insulin 4x/day at home. If in plan of care,  -  Consider adding Humulin R U-500 10 units at 11pm.  -  Consider increasing Lunchtime U-500 to 15 units (Patient's home dose)  Pt did not get U-500 dose yesterday (breakfast) due to being NPO for cath.   Thanks,  Tama Headings RN, MSN, BC-ADM Inpatient Diabetes Coordinator Team Pager [phone] (8a-5p)
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
## 2019-09-03 ENCOUNTER — Other Ambulatory Visit: Payer: Self-pay

## 2019-09-03 LAB — GLUCOSE, RANDOM: Glucose, Bld: 511 mg/dL (ref 70–99)

## 2019-09-03 LAB — GLUCOSE, CAPILLARY
Glucose-Capillary: 360 mg/dL — ABNORMAL HIGH (ref 70–99)
Glucose-Capillary: 451 mg/dL — ABNORMAL HIGH (ref 70–99)
Glucose-Capillary: 492 mg/dL — ABNORMAL HIGH (ref 70–99)
Glucose-Capillary: 338 mg/dL — ABNORMAL HIGH (ref 70–99)
Glucose-Capillary: 279 mg/dL — ABNORMAL HIGH (ref 70–99)

## 2019-09-03 MED ORDER — INSULIN REGULAR HUMAN (CONC) 500 UNIT/ML ~~LOC~~ SOPN: 15.0000 [IU] | PEN_INJECTOR | Freq: Two times a day (BID) | SUBCUTANEOUS | Status: DC

## 2019-09-03 MED ORDER — INSULIN REGULAR HUMAN (CONC) 500 UNIT/ML ~~LOC~~ SOPN
20.0000 [IU] | PEN_INJECTOR | Freq: Two times a day (BID) | SUBCUTANEOUS | Status: DC
  Administered 2019-09-03 – 2019-09-04 (×2): 20 [IU] via SUBCUTANEOUS

## 2019-09-03 MED ORDER — INSULIN REGULAR HUMAN (CONC) 500 UNIT/ML ~~LOC~~ SOPN
10.0000 [IU] | PEN_INJECTOR | Freq: Every day | SUBCUTANEOUS | Status: DC
  Administered 2019-09-03: 12:00:00 10 [IU] via SUBCUTANEOUS
  Filled 2019-09-03: qty 3

## 2019-09-03 NOTE — Plan of Care (Signed)

## 2019-09-03 NOTE — Progress Notes (Signed)
Pt's lunch time BS reading 451. STAT lab verification ordered and obtained and MD notified. Lab glucose came back at 511 (before insulin was given). MD made aware of critical value. Will continue to monitor.
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
## 2019-09-03 NOTE — Progress Notes (Addendum)
Inpatient Diabetes Program Recommendations  AACE/ADA: New Consensus Statement on Inpatient Glycemic Control (2015)  Target Ranges:  Prepandial:   less than 140 mg/dL      Peak postprandial:   less than 180 mg/dL (1-2 hours)      Critically ill patients:  140 - 180 mg/dL   Lab Results  Component Value Date   GLUCAP 360 (H) 09/03/2019   HGBA1C 9.3 (H) 09/01/2019    Review of Glycemic Control Results for Reginald Duncan, Reginald Duncan (MRN [id number]) as of 09/03/2019 10:36  Ref. Range 09/02/2019 11:51 09/02/2019 16:32 09/02/2019 21:28 09/03/2019 08:09  Glucose-Capillary Latest Ref Range: 70 - 99 mg/dL 410 (H) 319 (H) 289 (H) 360 (H)   Diabetes history: DM 2 Outpatient Diabetes medications: Humulin R U-500 10 units breakfast, 15 units lunch, 10 units supper, 10 units bedtime around 11 pm. Current orders for Inpatient glycemic control:  Humulin R U-500 10 units tid, Novolog 0-20 units tid + hs Lantus 7 units bid  Inpatient Diabetes Program Recommendations:    Referral received.  Patient to d/c home.  Recommend restart of home insulin U500.  A1C is 9.3%.  May need to increase U500 to 15 units with breakfast and 15 units with supper.  Continue home doses of U500 at lunch and bedtime.  Will need f/u with PCP.   Thanks,  Adah Perl, RN, BC-ADM Inpatient Diabetes Coordinator Pager [phone] (8a-5p)  Addendum 1500- Note that blood sugar increased to >400 at lunch.  Per documentation, patient did not receive U500 with breakfast.  Patient resistant to insulin and likely needs further adjustment.  Please consider increasing U500 to 15 units tid with meals (he has only received one dose of U500 today).  Will follow.
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
## 2019-09-04 ENCOUNTER — Inpatient Hospital Stay (HOSPITAL_COMMUNITY): Payer: Medicare HMO

## 2019-09-04 LAB — COMPREHENSIVE METABOLIC PANEL
ALT: 29 U/L (ref 0–44)
AST: 24 U/L (ref 15–41)
Albumin: 3.4 g/dL — ABNORMAL LOW (ref 3.5–5.0)
Alkaline Phosphatase: 63 U/L (ref 38–126)
Anion gap: 9 (ref 5–15)
BUN: 31 mg/dL — ABNORMAL HIGH (ref 8–23)
CO2: 30 mmol/L (ref 22–32)
Calcium: 9.5 mg/dL (ref 8.9–10.3)
Chloride: 99 mmol/L (ref 98–111)
Creatinine, Ser: 1.72 mg/dL — ABNORMAL HIGH (ref 0.61–1.24)
GFR calc Af Amer: 47 mL/min — ABNORMAL LOW (ref >60)
GFR calc non Af Amer: 40 mL/min — ABNORMAL LOW (ref >60)
Glucose, Bld: 305 mg/dL — ABNORMAL HIGH (ref 70–99)
Potassium: 4.6 mmol/L (ref 3.5–5.1)
Sodium: 138 mmol/L (ref 135–145)
Total Bilirubin: 0.6 mg/dL (ref 0.3–1.2)
Total Protein: 6.2 g/dL — ABNORMAL LOW (ref 6.5–8.1)

## 2019-09-04 LAB — GLUCOSE, CAPILLARY
Glucose-Capillary: 317 mg/dL — ABNORMAL HIGH (ref 70–99)
Glucose-Capillary: 394 mg/dL — ABNORMAL HIGH (ref 70–99)
Glucose-Capillary: 160 mg/dL — ABNORMAL HIGH (ref 70–99)
Glucose-Capillary: 200 mg/dL — ABNORMAL HIGH (ref 70–99)

## 2019-09-04 MED ORDER — INSULIN REGULAR HUMAN (CONC) 500 UNIT/ML ~~LOC~~ SOPN
5.0000 [IU] | PEN_INJECTOR | Freq: Once | SUBCUTANEOUS | Status: AC
  Administered 2019-09-04: 5 [IU] via SUBCUTANEOUS

## 2019-09-04 MED ORDER — INSULIN REGULAR HUMAN (CONC) 500 UNIT/ML ~~LOC~~ SOPN
25.0000 [IU] | PEN_INJECTOR | Freq: Two times a day (BID) | SUBCUTANEOUS | Status: DC
  Administered 2019-09-04 – 2019-09-05 (×2): 25 [IU] via SUBCUTANEOUS

## 2019-09-04 MED ORDER — INSULIN REGULAR HUMAN (CONC) 500 UNIT/ML ~~LOC~~ SOPN
15.0000 [IU] | PEN_INJECTOR | Freq: Every day | SUBCUTANEOUS | Status: DC
  Administered 2019-09-04 – 2019-09-05 (×2): 15 [IU] via SUBCUTANEOUS

## 2019-09-04 NOTE — Progress Notes (Signed)
   09/04/19 0430  What Happened  Was fall witnessed? No  Was patient injured? Unsure  Patient found on floor  Found by Staff-comment [facility])  Stated prior activity to/from bed, chair, or stretcher (obtained vitals )  Follow Up  MD notified  Baltazar Najjar, NP)  Time MD notified [phone]  Family notified Yes - comment (Brother)  Time family notified 0505  Additional tests Yes-comment (radiology-right hip)  Adult Fall Risk Assessment  Risk Factor Category (scoring not indicated) High fall risk per protocol (document High fall risk)  Age 68  Fall History: Fall within 6 months prior to admission 5  Elimination; Bowel and/or Urine Incontinence 0  Elimination; Bowel and/or Urine Urgency/Frequency 2  Medications: includes PCA/Opiates, Anti-convulsants, Anti-hypertensives, Diuretics, Hypnotics, Laxatives, Sedatives, and Psychotropics 3  Patient Care Equipment 1  Mobility-Assistance 2  Mobility-Gait 2  Mobility-Sensory Deficit 0  Altered awareness of immediate physical environment 0  Impulsiveness 0  Lack of understanding of one's physical/cognitive limitations 0  Total Score 16  Patient Fall Risk Level High fall risk  Adult Fall Risk Interventions  Required Bundle Interventions *See Row Information* High fall risk - low, moderate, and high requirements implemented  Additional Interventions Use of appropriate toileting equipment (bedpan, BSC, etc.)  Screening for Fall Injury Risk (To be completed on HIGH fall risk patients) - Assessing Need for Low Bed  Risk For Fall Injury- Low Bed Criteria None identified - Continue screening  Will Implement Low Bed and Floor Mats No - Criteria no longer met for low bed  Screening for Fall Injury Risk (To be completed on HIGH fall risk patients who do not meet crieteria for Low Bed) - Assessing Need for Floor Mats Only  Risk For Fall Injury- Criteria for Floor Mats None identified - No additional interventions needed  Vitals  Temp 98.1 F (36.7 C)  Temp  Source Oral  BP (!) 142/82  BP Location Right Arm  BP Method Automatic  Patient Position (if appropriate) Sitting  Pulse Rate 100  Pulse Rate Source Monitor  ECG Heart Rate 100  Cardiac Rhythm NSR;BBB  Resp 18  Oxygen Therapy  SpO2 95 %  O2 Device Nasal Cannula;Room Air  Pain Assessment  Pain Scale 0-10  Pain Score 7  Pain Type Acute pain  Pain Location Hip  Pain Orientation Right  Pain Intervention(s) MD notified (Comment);Medication (See eMAR)  Neurological  Neuro (WDL) WDL

## 2019-09-04 NOTE — Progress Notes (Signed)
Physical Therapy Treatment Patient Details Name: Reginald Duncan MRN: [id number] DOB: Feb 27, 1951 Today's Date: 09/04/2019    History of Present Illness 68 y.o. male with past medical history of coronary artery disease, sleep apnea, diabetes mellitus, hypertension, hyperlipidemia, peripheral vascular disease, COPD, chronic stage III kidney disease with chest pain and abnormal nuclear study.  Plan is for medical therapy.     PT Comments    Pt able to amb but requires assist to prevent falls due to poor balance and weakness and poor safety awareness. Recommend ST-SNF but pt reluctant and likely will refuse. If he refuses recommend HHPT. Reiterated to pt that he needs to use the walker and not a cane at this time. Pt fell this AM getting up unassisted.  Pt wanted to sit in recliner at conclusion of treatment and wanted to sit right next to window. Explained that he could sit facing the window a few feet away since I needed to make sure he could reach the call bell. He asked why he needed the call bell. I explained because he was not to get up unassisted and to call if he needed to get up. Pt continues to demonstrate poor insight and poor safety awareness.   Follow Up Recommendations  SNF;Supervision/Assistance - 24 hour     Equipment Recommendations  None recommended by PT    Recommendations for Other Services       Precautions / Restrictions Precautions Precautions: Fall;Other (comment) Precaution Comments: R knee buckle Restrictions Weight Bearing Restrictions: No    Mobility  Bed Mobility Overal bed mobility: Needs Assistance Bed Mobility: Supine to Sit     Supine to sit: Supervision    General bed mobility comments: Incr time  Transfers Overall transfer level: Needs assistance Equipment used: Rolling walker (2 wheeled) Transfers: Sit to/from Stand Sit to Stand: Min assist         General transfer comment: Assist to bring hips up and for  balance  Ambulation/Gait Ambulation/Gait assistance: Min assist Gait Distance (Feet): 80 Feet(x 2) Assistive device: Rolling walker (2 wheeled) Gait Pattern/deviations: Wide base of support;Trunk flexed;Decreased step length - left;Decreased step length - right;Antalgic;Step-through pattern Gait velocity: decreased Gait velocity interpretation: <1.31 ft/sec, indicative of household ambulator General Gait Details: Assist for balance and support. Verbal cues to stay closer to walker and to keep both hands on walker as pt several times lifted hand off of walker and has loss of balance.    Stairs             Wheelchair Mobility    Modified Rankin (Stroke Patients Only)       Balance Overall balance assessment: Needs assistance Sitting-balance support: Feet supported Sitting balance-Leahy Scale: Fair     Standing balance support: Bilateral upper extremity supported Standing balance-Leahy Scale: Poor Standing balance comment: walker and min guard for static standing                            Cognition Arousal/Alertness: Awake/alert Behavior During Therapy: WFL for tasks assessed/performed Overall Cognitive Status: No family/caregiver present to determine baseline cognitive functioning Area of Impairment: Safety/judgement                         Safety/Judgement: Decreased awareness of safety;Decreased awareness of deficits     General Comments: Pt fell earlier when getting up unassisted. Pt easily distracted.      Exercises  General Comments        Pertinent Vitals/Pain Pain Assessment: Faces Faces Pain Scale: Hurts even more Pain Location: rt hip  Pain Descriptors / Indicators: Grimacing;Guarding Pain Intervention(s): Limited activity within patient's tolerance;Monitored during session    Home Living                      Prior Function            PT Goals (current goals can now be found in the care plan section)  Acute Rehab PT Goals Patient Stated Goal: go home Progress towards PT goals: Progressing toward goals    Frequency    Min 3X/week      PT Plan Current plan remains appropriate    Co-evaluation              AM-PAC PT "6 Clicks" Mobility   Outcome Measure  Help needed turning from your back to your side while in a flat bed without using bedrails?: None Help needed moving from lying on your back to sitting on the side of a flat bed without using bedrails?: A Little Help needed moving to and from a bed to a chair (including a wheelchair)?: A Little Help needed standing up from a chair using your arms (e.g., wheelchair or bedside chair)?: A Little Help needed to walk in hospital room?: A Little Help needed climbing 3-5 steps with a railing? : Total 6 Click Score: 17    End of Session Equipment Utilized During Treatment: Gait belt Activity Tolerance: Patient tolerated treatment well Patient left: with call bell/phone within reach;in chair;with chair alarm set Nurse Communication: Mobility status PT Visit Diagnosis: Unsteadiness on feet (R26.81);Other abnormalities of gait and mobility (R26.89);History of falling (Z91.81);Difficulty in walking, not elsewhere classified (R26.2);Pain Pain - Right/Left: Right Pain - part of body: Hip     Time: 1204-1225 PT Time Calculation (min) (ACUTE ONLY): 21 min  Charges:  $Gait Training: 8-22 mins                     [place] Pager [phone] Office [place] 09/04/2019, 1:31 PM

## 2019-09-04 NOTE — Care Management Important Message (Signed)
Important Message  Patient Details  Name: ZAMEIR WALTON MRN: [id number] Date of Birth: March 23, 1951   Medicare Important Message Given:  Yes     Shelda Altes 09/04/2019, 12:07 PM
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
## 2019-09-04 NOTE — Progress Notes (Addendum)
Physical Therapy Treatment Patient Details Name: Reginald Duncan MRN: [id number] DOB: 28-Dec-1950 Today's Date: 09/04/2019    History of Present Illness 68 y.o. male with past medical history of coronary artery disease, sleep apnea, diabetes mellitus, hypertension, hyperlipidemia, peripheral vascular disease, COPD, chronic stage III kidney disease with chest pain and abnormal nuclear study.  Plan is for medical therapy.     PT Comments    Pt with fall earlier this AM when getting up to use urinal unassisted. Pt with poor safety awareness. Continue to recommend SNF but likely pt will refuse.    Follow Up Recommendations  SNF;Supervision/Assistance - 24 hour     Equipment Recommendations  None recommended by PT    Recommendations for Other Services       Precautions / Restrictions Precautions Precautions: Fall;Other (comment) Precaution Comments: R knee buckle Restrictions Weight Bearing Restrictions: No    Mobility  Bed Mobility Overal bed mobility: Needs Assistance Bed Mobility: Sit to Supine       Sit to supine: Supervision   General bed mobility comments: Incr time  Transfers Overall transfer level: Needs assistance Equipment used: Rolling walker (2 wheeled) Transfers: Sit to/from Stand Sit to Stand: Min assist;From elevated surface         General transfer comment: Assist to bring hips up and for balance  Ambulation/Gait Ambulation/Gait assistance: Min assist Gait Distance (Feet): 2 Feet Assistive device: Rolling walker (2 wheeled) Gait Pattern/deviations: Wide base of support;Trunk flexed;Step-to pattern;Decreased step length - left;Decreased step length - right;Antalgic Gait velocity: decreased Gait velocity interpretation: <1.31 ft/sec, indicative of household ambulator General Gait Details: Assist for balance and support. Only stepped a few steps back and forth to bed due to only 1 person assist and pt with fall earlier today.   Stairs              Wheelchair Mobility    Modified Rankin (Stroke Patients Only)       Balance Overall balance assessment: Needs assistance Sitting-balance support: Feet supported Sitting balance-Leahy Scale: Fair     Standing balance support: Bilateral upper extremity supported Standing balance-Leahy Scale: Poor Standing balance comment: walker and min guard for static standing                            Cognition Arousal/Alertness: Awake/alert Behavior During Therapy: WFL for tasks assessed/performed Overall Cognitive Status: Impaired/Different from baseline Area of Impairment: Safety/judgement                         Safety/Judgement: Decreased awareness of safety;Decreased awareness of deficits     General Comments: Pt fell earlier when getting up unassisted      Exercises      General Comments        Pertinent Vitals/Pain Pain Assessment: Faces Faces Pain Scale: Hurts even more Pain Location: rt hip  Pain Descriptors / Indicators: Grimacing;Guarding Pain Intervention(s): Limited activity within patient's tolerance;Monitored during session    Home Living                      Prior Function            PT Goals (current goals can now be found in the care plan section) Acute Rehab PT Goals Patient Stated Goal: go home Progress towards PT goals: Not progressing toward goals - comment    Frequency    Min 3X/week  PT Plan Current plan remains appropriate    Co-evaluation              AM-PAC PT "6 Clicks" Mobility   Outcome Measure  Help needed turning from your back to your side while in a flat bed without using bedrails?: None Help needed moving from lying on your back to sitting on the side of a flat bed without using bedrails?: A Little Help needed moving to and from a bed to a chair (including a wheelchair)?: A Little Help needed standing up from a chair using your arms (e.g., wheelchair or bedside chair)?: A  Little Help needed to walk in hospital room?: A Little Help needed climbing 3-5 steps with a railing? : Total 6 Click Score: 17    End of Session Equipment Utilized During Treatment: Gait belt Activity Tolerance: Patient tolerated treatment well Patient left: in bed;with call bell/phone within reach;with bed alarm set Nurse Communication: Mobility status PT Visit Diagnosis: Unsteadiness on feet (R26.81);Other abnormalities of gait and mobility (R26.89);History of falling (Z91.81);Difficulty in walking, not elsewhere classified (R26.2);Pain Pain - Right/Left: Right Pain - part of body: Hip     Time: [id number] PT Time Calculation (min) (ACUTE ONLY): 18 min  Charges:  $Gait Training: 8-22 mins                     [place] Pager [phone] Office [place] 09/04/2019, 10:54 AM

## 2019-09-04 NOTE — Progress Notes (Signed)
Patient states living in [place] and stated that he does not know how he is going to get home at discharge. Will pass on to day shift RN and place a case management order.
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
## 2019-09-05 LAB — BASIC METABOLIC PANEL
Anion gap: 14 (ref 5–15)
BUN: 30 mg/dL — ABNORMAL HIGH (ref 8–23)
CO2: 26 mmol/L (ref 22–32)
Calcium: 9.7 mg/dL (ref 8.9–10.3)
Chloride: 99 mmol/L (ref 98–111)
Creatinine, Ser: 1.42 mg/dL — ABNORMAL HIGH (ref 0.61–1.24)
GFR calc Af Amer: 59 mL/min — ABNORMAL LOW (ref >60)
GFR calc non Af Amer: 51 mL/min — ABNORMAL LOW (ref >60)
Glucose, Bld: 251 mg/dL — ABNORMAL HIGH (ref 70–99)
Potassium: 4.2 mmol/L (ref 3.5–5.1)
Sodium: 139 mmol/L (ref 135–145)

## 2019-09-05 LAB — GLUCOSE, CAPILLARY
Glucose-Capillary: 208 mg/dL — ABNORMAL HIGH (ref 70–99)
Glucose-Capillary: 210 mg/dL — ABNORMAL HIGH (ref 70–99)
Glucose-Capillary: 222 mg/dL — ABNORMAL HIGH (ref 70–99)
Glucose-Capillary: 297 mg/dL — ABNORMAL HIGH (ref 70–99)

## 2019-09-05 MED ORDER — METOPROLOL SUCCINATE ER 100 MG PO TB24: 100.0000 mg | ORAL_TABLET | Freq: Every day | ORAL | 0 refills | Status: DC

## 2019-09-05 MED ORDER — OMEGA-3-ACID ETHYL ESTERS 1 G PO CAPS: 2.0000 g | ORAL_CAPSULE | Freq: Two times a day (BID) | ORAL | 0 refills | Status: DC

## 2019-09-05 MED ORDER — CEPHALEXIN 500 MG PO CAPS: 500.0000 mg | ORAL_CAPSULE | Freq: Two times a day (BID) | ORAL | 0 refills | Status: DC

## 2019-09-05 MED ORDER — INSULIN REGULAR HUMAN (CONC) 500 UNIT/ML ~~LOC~~ SOPN: 25.0000 [IU] | PEN_INJECTOR | Freq: Two times a day (BID) | SUBCUTANEOUS | 0 refills | Status: DC

## 2019-09-05 MED ORDER — INSULIN REGULAR HUMAN (CONC) 500 UNIT/ML ~~LOC~~ SOPN: 15.0000 [IU] | PEN_INJECTOR | Freq: Every day | SUBCUTANEOUS | 0 refills | Status: DC

## 2019-09-05 NOTE — Progress Notes (Signed)
Inpatient Diabetes Program Recommendations  AACE/ADA: New Consensus Statement on Inpatient Glycemic Control (2015)  Target Ranges:  Prepandial:   less than 140 mg/dL      Peak postprandial:   less than 180 mg/dL (1-2 hours)      Critically ill patients:  140 - 180 mg/dL   Lab Results  Component Value Date   GLUCAP 222 (H) 09/05/2019   HGBA1C 9.3 (H) 09/01/2019    Review of Glycemic Control Results for [facility], POS[facility] (MRN [id number]) as of 09/05/2019 10:09  Ref. Range 09/04/2019 21:01 09/05/2019 00:56 09/05/2019 05:15 09/05/2019 08:11  Glucose-Capillary Latest Ref Range: 70 - 99 mg/dL 200 (H) 210 (H) 208 (H) 222 (H)   Diabetes history: DM 2 Outpatient Diabetes medications: Humulin R U-500 10 units breakfast, 15 units lunch, 10 units supper, 10 units bedtime around 11 pm. Current orders for Inpatient glycemic control:  Humulin R U-500 25/15/25 units, Novolog 0-20 units tid + hs   Inpatient Diabetes Program Recommendations:    Glucose trends are beginning to trends towards inpatient goals.   Consider increasing U-500 to 20 units with lunch.   Thanks, Bronson Curb, MSN, RNC-OB Diabetes Coordinator [phone] (8a-5p)

## 2019-09-05 NOTE — Discharge Instructions (Signed)
Radial Site Care ° °This sheet gives you information about how to care for yourself after your procedure. Your health care provider may also give you more specific instructions. If you have problems or questions, contact your health care provider. °What can I expect after the procedure? °After the procedure, it is common to have: °· Bruising and tenderness at the catheter insertion area. °Follow these instructions at home: °Medicines °· Take over-the-counter and prescription medicines only as told by your health care provider. °Insertion site care °· Follow instructions from your health care provider about how to take care of your insertion site. Make sure you: °? Wash your hands with soap and water before you change your bandage (dressing). If soap and water are not available, use hand sanitizer. °? Change your dressing as told by your health care provider. °? Leave stitches (sutures), skin glue, or adhesive strips in place. These skin closures may need to stay in place for 2 weeks or longer. If adhesive strip edges start to loosen and curl up, you may trim the loose edges. Do not remove adhesive strips completely unless your health care provider tells you to do that. °· Check your insertion site every day for signs of infection. Check for: °? Redness, swelling, or pain. °? Fluid or blood. °? Pus or a bad smell. °? Warmth. °· Do not take baths, swim, or use a hot tub until your health care provider approves. °· You may shower 24-48 hours after the procedure, or as directed by your health care provider. °? Remove the dressing and gently wash the site with plain soap and water. °? Pat the area dry with a clean towel. °? Do not rub the site. That could cause bleeding. °· Do not apply powder or lotion to the site. °Activity ° °· For 24 hours after the procedure, or as directed by your health care provider: °? Do not flex or bend the affected arm. °? Do not push or pull heavy objects with the affected arm. °? Do not  drive yourself home from the hospital or clinic. You may drive 24 hours after the procedure unless your health care provider tells you not to. °? Do not operate machinery or power tools. °· Do not lift anything that is heavier than 10 lb (4.5 kg), or the limit that you are told, until your health care provider says that it is safe. °· Ask your health care provider when it is okay to: °? Return to work or school. °? Resume usual physical activities or sports. °? Resume sexual activity. °General instructions °· If the catheter site starts to bleed, raise your arm and put firm pressure on the site. If the bleeding does not stop, get help right away. This is a medical emergency. °· If you went home on the same day as your procedure, a responsible adult should be with you for the first 24 hours after you arrive home. °· Keep all follow-up visits as told by your health care provider. This is important. °Contact a health care provider if: °· You have a fever. °· You have redness, swelling, or yellow drainage around your insertion site. °Get help right away if: °· You have unusual pain at the radial site. °· The catheter insertion area swells very fast. °· The insertion area is bleeding, and the bleeding does not stop when you hold steady pressure on the area. °· Your arm or hand becomes pale, cool, tingly, or numb. °These symptoms may represent a serious problem   that is an emergency. Do not wait to see if the symptoms will go away. Get medical help right away. Call your local emergency services (911 in the [place] Do not drive yourself to the hospital. Summary  After the procedure, it is common to have bruising and tenderness at the site.  Follow instructions from your health care provider about how to take care of your radial site wound. Check the wound every day for signs of infection.  Do not lift anything that is heavier than 10 lb (4.5 kg), or the limit that you are told, until your health care provider says  that it is safe. This information is not intended to replace advice given to you by your health care provider. Make sure you discuss any questions you have with your health care provider. Document Released: 11/11/2010 Document Revised: 11/14/2017 Document Reviewed: 11/14/2017 Elsevier Patient Education  2020 [employer]. Hyperglycemia Hyperglycemia is when the sugar (glucose) level in your blood is too high. It may not cause symptoms. If you do have symptoms, they may include warning signs, such as:  Feeling more thirsty than normal.  Hunger.  Feeling tired.  Needing to pee (urinate) more than normal.  Blurry eyesight (vision). You may get other symptoms as it gets worse, such as:  Dry mouth.  Not being hungry (loss of appetite).  Fruity-smelling breath.  Weakness.  Weight gain or loss that is not planned. Weight loss may be fast.  A tingling or numb feeling in your hands or feet.  Headache.  Skin that does not bounce back quickly when it is lightly pinched and released (poor skin turgor).  Pain in your belly (abdomen).  Cuts or bruises that heal slowly. High blood sugar can happen to people who do or do not have diabetes. High blood sugar can happen slowly or quickly, and it can be an emergency. Follow these instructions at home: General instructions  Take over-the-counter and prescription medicines only as told by your doctor.  Do not use products that contain nicotine or tobacco, such as cigarettes and e-cigarettes. If you need help quitting, ask your doctor.  Limit alcohol intake to no more than 1 drink per day for nonpregnant women and 2 drinks per day for men. One drink equals 12 oz of beer, 5 oz of wine, or 1 oz of hard liquor.  Manage stress. If you need help with this, ask your doctor.  Keep all follow-up visits as told by your doctor. This is important. Eating and drinking   Stay at a healthy weight.  Exercise regularly, as told by your  doctor.  Drink enough fluid, especially when you: ? Exercise. ? Get sick. ? Are in hot temperatures.  Eat healthy foods, such as: ? Low-fat (lean) proteins. ? Complex carbs (complex carbohydrates), such as whole wheat bread or brown rice. ? Fresh fruits and vegetables. ? Low-fat dairy products. ? Healthy fats.  Drink enough fluid to keep your pee (urine) clear or pale yellow. If you have diabetes:   Make sure you know the symptoms of hyperglycemia.  Follow your diabetes management plan, as told by your doctor. Make sure you: ? Take insulin and medicines as told. ? Follow your exercise plan. ? Follow your meal plan. Eat on time. Do not skip meals. ? Check your blood sugar as often as told. Make sure to check before and after exercise. If you exercise longer or in a different way than you normally do, check your blood sugar more often. ?  Follow your sick day plan whenever you cannot eat or drink normally. Make this plan ahead of time with your doctor.  Share your diabetes management plan with people in your workplace, school, and household.  Check your urine for ketones when you are ill and as told by your doctor.  Carry a card or wear jewelry that says that you have diabetes. Contact a doctor if:  Your blood sugar level is higher than 240 mg/dL (13.3 mmol/L) for 2 days in a row.  You have problems keeping your blood sugar in your target range.  High blood sugar happens often for you. Get help right away if:  You have trouble breathing.  You have a change in how you think, feel, or act (mental status).  You feel sick to your stomach (nauseous), and that feeling does not go away.  You cannot stop throwing up (vomiting). These symptoms may be an emergency. Do not wait to see if the symptoms will go away. Get medical help right away. Call your local emergency services (911 in the [place] Do not drive yourself to the hospital. Summary  Hyperglycemia is when the sugar  (glucose) level in your blood is too high.  High blood sugar can happen to people who do or do not have diabetes.  Make sure you drink enough fluids, eat healthy foods, and exercise regularly.  Contact your doctor if you have problems keeping your blood sugar in your target range. This information is not intended to replace advice given to you by your health care provider. Make sure you discuss any questions you have with your health care provider. Document Released: 08/06/2009 Document Revised: 06/26/2016 Document Reviewed: 06/26/2016 Elsevier Patient Education  2020 [employer].

## 2019-09-05 NOTE — Discharge Summary (Signed)
Reginald Duncan [id number] DOB: September 05, 1951 DOA: 08/31/2019  PCP: Darrol Jump, PA-C  Admit date: 08/31/2019 Discharge date: 09/05/2019  Admitted From: Home Disposition: Home   Recommendations for Outpatient Follow-up:  1. Follow up with PCP in 1 week 2. Please obtain BMP/CBC in one week 3. Please follow up on the following pending results:  Home Health: PT OT nursing and social visit   Discharge Condition:Stable CODE STATUS: Full  Diet recommendation: Heart Healthy / Carb Modified   Brief/Interim Summary: Reginald Duncan is a 68 y.o. male with medical history significant of CAD s/p LAD stent 2014 with cath in 2015 demonstrating patency and distal LAD chronic occlusion; PAD; CVA; COPD; stage 3 CKD; DM; HTN; OSA presenting in transfer from [facility] for chest pain.    He had an abnormal stress test and was then transferred here for further cardiac optimization followed by cardiac catheterization.  He was also having issues with hyperglycemia.  Cardiology was consulted and diabetic educator was also consulted.  Patient underwent cardiac catheterization with results stated below.  He was also found with uncontrolled diabetes with hyperglycemia.  His hemoglobin A1c was 9.3 on 09/01/2019.  He continued due to having uncontrolled blood glucose levels and his insulin was increased to 25 twice daily with meals and his lunch insulin was increased to 15 units with improvement of his blood glucose levels.  He also was found with right lower extremity wound post fall and cellulitis he received also dressing and was placed on Keflex which she was continued.  PT OT was consulted they recommended SNF for 24-hour care.  Patient multiple times refused SNF and he wanted to go home even though discuss risk of falls and other issues he refused to go to SNF.  He will be discharged home today as he is medically stable.    Procedures: Post left heart cath and coronary angiography.  1. Multivessel coronary artery  disease with 50-60% mid LAD stenosis and chronic total occlusion of the distal LAD, as well as 70% D2 lesion and sequential 30-40% proximal and 50-60% distal RCA stenoses. 2. Ostial/proximal LAD stent is widely patent. 3. Mildly to moderately left ventricular filling pressure (LVEDP ~24 mmHg).  Recommendations: 1. Medical therapy and aggressive secondary prevention of coronary artery disease. 2. Consider gentle diuresis if renal function tolerates. 3. Continue indefinite dual antiplatelet therapy with aspirin and clopidogrel  Discharge Diagnoses:  Principal Problem:   ACS (acute coronary syndrome) (HCC) Active Problems:   HYPERCHOLESTEROLEMIA   HYPERTENSION, BENIGN ESSENTIAL   CKD (chronic kidney disease), stage III (HCC)   DM (diabetes mellitus) (HCC)   COPD (chronic obstructive pulmonary disease) (HCC)   Chronic back pain   Severe obesity (BMI 35.0-39.9) with comorbidity ([place])   Hernia of abdominal wall   Gross hematuria   Cellulitis and abscess of right lower extremity    Discharge Instructions  Discharge Instructions    Call MD for:  difficulty breathing, headache or visual disturbances   Complete by: As directed    Call MD for:  temperature >100.4   Complete by: As directed    Diet - low sodium heart healthy   Complete by: As directed    Carb control   Discharge instructions   Complete by: As directed    F/u with diabetic doctor and pcp in 1 week F/u with his cardiologist 1 week   Increase activity slowly   Complete by: As directed    With PT/OT     Allergies as of 09/05/2019  No Known Allergies     Medication List    STOP taking these medications   HUMULIN R 500 UNIT/ML injection Generic drug: insulin regular human CONCENTRATED Replaced by: insulin regular human CONCENTRATED 500 UNIT/ML kwikpen   metoprolol tartrate 100 MG tablet Commonly known as: LOPRESSOR     TAKE these medications   amLODipine 2.5 MG tablet Commonly known as: NORVASC Take 2.5  mg by mouth daily.   aspirin 81 MG tablet Take 81 mg by mouth daily.   cephALEXin 500 MG capsule Commonly known as: KEFLEX Take 1 capsule (500 mg total) by mouth 2 (two) times daily.   clopidogrel 75 MG tablet Commonly known as: PLAVIX TAKE 1 TABLET BY MOUTH EVERY DAY WITH BREAKFAST What changed:   how much to take  how to take this  when to take this  additional instructions   famotidine 10 MG tablet Commonly known as: PEPCID Take 10 mg by mouth at bedtime.   folic acid 1 MG tablet Commonly known as: FOLVITE Take 1 mg by mouth daily.   HYDROcodone-acetaminophen 7.5-325 MG tablet Commonly known as: NORCO Take 1 tablet by mouth every 4 (four) hours as needed for moderate pain. Takes 5-325 mg tablets   insulin regular human CONCENTRATED 500 UNIT/ML kwikpen Commonly known as: HUMULIN R Inject 25 Units into the skin 2 (two) times daily with a meal.   insulin regular human CONCENTRATED 500 UNIT/ML kwikpen Commonly known as: HUMULIN R Inject 15 Units into the skin daily with lunch. Start taking on: September 06, 2019 Replaces: HUMULIN R 500 UNIT/ML injection   metoprolol succinate 100 MG 24 hr tablet Commonly known as: TOPROL-XL Take 1 tablet (100 mg total) by mouth daily. Take with or immediately following a meal. Start taking on: September 06, 2019 What changed:   medication strength  how much to take   nitroGLYCERIN 0.4 MG SL tablet Commonly known as: NITROSTAT Place 1 tablet (0.4 mg total) under the tongue every 5 (five) minutes as needed. Chest pain   omega-3 acid ethyl esters 1 g capsule Commonly known as: LOVAZA Take 2 capsules (2 g total) by mouth 2 (two) times daily.   OVER THE COUNTER MEDICATION Apply 1 application topically daily as needed. Medication:Clear Barrier Moisture Ointment. Apply to groin area as needed for dryness. Per patient   pregabalin 75 MG capsule Commonly known as: LYRICA Take 75-150 mg by mouth See admin instructions. Take 75 mg  every morning and 150 mg every night,   QUEtiapine 300 MG tablet Commonly known as: SEROQUEL Take 300 mg by mouth at bedtime.   rosuvastatin 20 MG tablet Commonly known as: CRESTOR TAKE 1 TABLET DAILY What changed: how much to take   Vascepa 1 g Caps Generic drug: Icosapent Ethyl Take 4 capsules by mouth at bedtime.       No Known Allergies  Consultations:     Procedures/Studies: Dg Abd Portable 1v  Result Date: 09/02/2019 CLINICAL DATA:  68 year old male with abdominal pain. History of multiple hernias. EXAM: PORTABLE ABDOMEN - 1 VIEW COMPARISON:  CT of the abdomen pelvis dated 09/11/2015. FINDINGS: There is no bowel dilatation or evidence of obstruction. No free air and radiopaque calculi identified. Vascular calcifications noted in the left upper abdomen. There is degenerative changes of the spine. No acute osseous pathology. IMPRESSION: No bowel obstruction. Electronically Signed   By: Anner Crete M.D.   On: 09/02/2019 09:39   Dg Hip Unilat With Pelvis 1v Right  Result Date: 09/04/2019 CLINICAL DATA:  Fall.  Right hip pain. EXAM: DG HIP (WITH OR WITHOUT PELVIS) 1V RIGHT COMPARISON:  None. FINDINGS: Degenerative changes lumbar spine and both hips. No acute bony abnormality identified. No evidence of fracture or dislocation. IMPRESSION: Degenerative changes lumbar spine and both hips. No acute abnormality identified. Electronically Signed   By: Marcello Moores  Register   On: 09/04/2019 05:57       Subjective: Patient seen and examined.  Denies any shortness of breath, chest pain, or any other symptoms.  He still has his chronic knee and back pain.  Discharge Exam: Vitals:   09/05/19 0518 09/05/19 0854  BP: 107/80 (!) 118/91  Pulse: 72 89  Resp: 18   Temp: 97.8 F (36.6 C)   SpO2: 95%    Vitals:   09/04/19 1649 09/04/19 2110 09/05/19 0518 09/05/19 0854  BP: 118/76 (!) 154/79 107/80 (!) 118/91  Pulse: 70 71 72 89  Resp: 19 16 18    Temp: 97.7 F (36.5 C) 97.6 F  (36.4 C) 97.8 F (36.6 C)   TempSrc: Oral Oral Oral   SpO2: 96% 96% 95%   Weight:   123 kg   Height:        General: Pt is alert, awake, not in acute distress Cardiovascular: RRR, S1/S2 +, no rubs, no gallops Respiratory: CTA bilaterally, no wheezing, no rhonchi Abdominal: Soft, NT, ND, bowel sounds + Extremities: no edema, no cyanosis    The results of significant diagnostics from this hospitalization (including imaging, microbiology, ancillary and laboratory) are listed below for reference.     Microbiology: No results found for this or any previous visit (from the past 240 hour(s)).   Labs: BNP (last 3 results) No results for input(s): BNP in the last 8760 hours. Basic Metabolic Panel: Recent Labs  Lab 09/01/19 0403 09/02/19 0344 09/02/19 1206 09/03/19 1137 09/04/19 0348 09/05/19 0918  NA 137 137  --   --  138 139  K 4.6 4.5  --   --  4.6 4.2  CL 100 101  --   --  99 99  CO2 28 27  --   --  30 26  GLUCOSE 328* 261* 446* 511* 305* 251*  BUN 24* 23  --   --  31* 30*  CREATININE 1.35* 1.39*  --   --  1.72* 1.42*  CALCIUM 9.4 9.6  --   --  9.5 9.7   Liver Function Tests: Recent Labs  Lab 09/04/19 0348  AST 24  ALT 29  ALKPHOS 63  BILITOT 0.6  PROT 6.2*  ALBUMIN 3.4*   No results for input(s): LIPASE, AMYLASE in the last 168 hours. No results for input(s): AMMONIA in the last 168 hours. CBC: Recent Labs  Lab 09/01/19 0403  WBC 5.8  HGB 13.0  HCT 39.8  MCV 90.5  PLT 153   Cardiac Enzymes: No results for input(s): CKTOTAL, CKMB, CKMBINDEX, TROPONINI in the last 168 hours. BNP: Invalid input(s): POCBNP CBG: Recent Labs  Lab 09/04/19 2101 09/05/19 0056 09/05/19 0515 09/05/19 0811 09/05/19 1215  GLUCAP 200* 210* 208* 222* 297*   D-Dimer No results for input(s): DDIMER in the last 72 hours. Hgb A1c No results for input(s): HGBA1C in the last 72 hours. Lipid Profile No results for input(s): CHOL, HDL, LDLCALC, TRIG, CHOLHDL, LDLDIRECT in  the last 72 hours. Thyroid function studies No results for input(s): TSH, T4TOTAL, T3FREE, THYROIDAB in the last 72 hours.  Invalid input(s): FREET3 Anemia work up No results for input(s): VITAMINB12, FOLATE, FERRITIN,  TIBC, IRON, RETICCTPCT in the last 72 hours. Urinalysis No results found for: COLORURINE, APPEARANCEUR, LABSPEC, [place], GLUCOSEU, HGBUR, BILIRUBINUR, KETONESUR, PROTEINUR, UROBILINOGEN, NITRITE, LEUKOCYTESUR Sepsis Labs Invalid input(s): PROCALCITONIN,  WBC,  LACTICIDVEN Microbiology No results found for this or any previous visit (from the past 240 hour(s)).   Time coordinating discharge: Over 30 minutes  SIGNED:   [name], MD  [facility] 09/05/2019, 1:35 PM Pager   If 7PM-7AM, please contact night-coverage [email or web address] Password [email or web address]

## 2019-09-05 NOTE — TOC Initial Note (Signed)
Transition of Care [facility]) - Initial/Assessment Note    Patient Details  Name: Reginald Duncan MRN: [id number] Date of Birth: May 17, 1951  Transition of [facility]) CM/SW Contact:    Alberteen Sam, [place] Phone Number: [phone] 09/05/2019, 3:41 PM  Clinical Narrative:                  CSW spoke with patient regarding discharge planning and refusal of SNF, he reports he is already set up with [facility] and has no DME needs. CSW spoke with Tillie Rung at [facility] who confirms patient receives nursing from them, CSW informed her of additional services being added of PT, OT, aide and social work. She requests fax of the orders as well as dc summary to [phone]. CSW has faxed over information. Patient reports family is on their way to pick him up for discharge. No further discharge needs at this time.   Expected Discharge Plan: [place] Barriers to Discharge: No Barriers Identified   Patient Goals and CMS Choice Patient states their goals for this hospitalization and ongoing recovery are:: to go home CMS Medicare.gov Compare Post Acute Care list provided to:: Patient Choice offered to / list presented to : Patient  Expected Discharge Plan and Services Expected Discharge Plan: [place]       Living arrangements for the past 2 months: Single Family Home Expected Discharge Date: 09/05/19                         HH Arranged: Social Work, Nurse's Aide, Therapist, sports, OT, PT [place] Agency: [facility] Date [place]: 09/05/19 Time New Site: [phone] Representative spoke with at [place]: [place] Arrangements/Services Living arrangements for the past 2 months: [place] with:: Self Patient language and need for interpreter reviewed:: Yes Do you feel safe going back to the place where you live?: Yes      Need for Family Participation in Patient Care: No  (Comment) Care giver support system in place?: Yes (comment)   Criminal Activity/Legal Involvement Pertinent to Current Situation/Hospitalization: No - Comment as needed  Activities of Daily Living Home Assistive Devices/Equipment: Cane (specify quad or straight), Walker (specify type), Oxygen(PRN) ADL Screening (condition at time of admission) Patient's cognitive ability adequate to safely complete daily activities?: No Is the patient deaf or have difficulty hearing?: No Does the patient have difficulty seeing, even when wearing glasses/contacts?: No Does the patient have difficulty concentrating, remembering, or making decisions?: No Patient able to express need for assistance with ADLs?: Yes Does the patient have difficulty dressing or bathing?: No Independently performs ADLs?: Yes (appropriate for developmental age) Does the patient have difficulty walking or climbing stairs?: Yes Weakness of Legs: Both Weakness of Arms/Hands: None  Permission Sought/Granted         Permission granted to share info w AGENCY: Home Health        Emotional Assessment Appearance:: Appears stated age Attitude/Demeanor/Rapport: Complaining Affect (typically observed): Apprehensive Orientation: : Oriented to Self, Oriented to Place, Oriented to  Time, Oriented to Situation Alcohol / Substance Use: Not Applicable Psych Involvement: No (comment)  Admission diagnosis:  CHEST PAIN Patient Active Problem List   Diagnosis Date Noted  . ACS (acute coronary syndrome) ([place]) 08/31/2019  . Severe obesity (BMI 35.0-39.9) with comorbidity ([place]) 08/31/2019  . Hernia of abdominal wall 08/31/2019  . Gross hematuria 08/31/2019  .  Cellulitis and abscess of right lower extremity 08/31/2019  . Wide-complex tachycardia ([place]) 08/13/2018  . COPD (chronic obstructive pulmonary disease) ([place])   . Chronic back pain   . Near syncope 08/12/2018  . DM (diabetes mellitus) ([place]) 04/10/2014  . Angina pectoris ([place])  04/08/2014  . Depression 01/13/2013  . CKD (chronic kidney disease), stage III ([place])   . Intermediate coronary syndrome - [place] angina 12/25/2012  . CHEST PAIN UNSPECIFIED 10/20/2010  . Coronary atherosclerosis 08/04/2010  . FOREIGN BODY, SOFT TISSUE 09/03/2007  . PSTPRC STATUS, PTCA 08/19/2007  . UPPER RESPIRATORY INFECTION, ACUTE, WITH BRONCHITIS 08/13/2007  . [place], [place] NEC 08/06/2007  . DEPRESSIVE DISORDER 08/01/2007  . EDEMA- LOCALIZED 08/01/2007  . HYPERTENSION, BENIGN ESSENTIAL 07/16/2007  . LEG, LOWER, PAIN 07/16/2007  . Diabetes ([place]) 12/20/2006  . HYPERCHOLESTEROLEMIA 12/20/2006  . HYPERTRIGLYCERIDEMIA 12/20/2006  . HYPERTENSIVE HEART DISEASE 12/20/2006  . Obesity with sleep apnea 12/20/2006  . PROTEINURIA 12/20/2006   PCP:  Darrol Jump, PA-C Pharmacy:   [id number] - [place], [address] [place]  [id number] Phone: [phone] Fax: [phone]     Social Determinants of Health (SDOH) Interventions    Readmission Risk Interventions No flowsheet data found.

## 2019-09-08 DIAGNOSIS — G894 Chronic pain syndrome: Secondary | ICD-10-CM | POA: Diagnosis not present

## 2019-09-08 DIAGNOSIS — Z79899 Other long term (current) drug therapy: Secondary | ICD-10-CM | POA: Diagnosis not present

## 2019-09-08 DIAGNOSIS — M542 Cervicalgia: Secondary | ICD-10-CM | POA: Diagnosis not present

## 2019-09-09 DIAGNOSIS — Z9119 Patient's noncompliance with other medical treatment and regimen: Secondary | ICD-10-CM | POA: Diagnosis not present

## 2019-09-09 DIAGNOSIS — Z6841 Body Mass Index (BMI) 40.0 and over, adult: Secondary | ICD-10-CM | POA: Diagnosis not present

## 2019-09-09 DIAGNOSIS — I249 Acute ischemic heart disease, unspecified: Secondary | ICD-10-CM | POA: Diagnosis not present

## 2019-09-09 DIAGNOSIS — L0390 Cellulitis, unspecified: Secondary | ICD-10-CM | POA: Diagnosis not present

## 2019-09-09 DIAGNOSIS — Z7689 Persons encountering health services in other specified circumstances: Secondary | ICD-10-CM | POA: Diagnosis not present

## 2019-09-09 DIAGNOSIS — W57XXXA Bitten or stung by nonvenomous insect and other nonvenomous arthropods, initial encounter: Secondary | ICD-10-CM | POA: Diagnosis not present

## 2019-09-09 DIAGNOSIS — E1165 Type 2 diabetes mellitus with hyperglycemia: Secondary | ICD-10-CM | POA: Diagnosis not present

## 2019-09-09 LAB — GLUCOSE, CAPILLARY
Glucose-Capillary: 341 mg/dL — ABNORMAL HIGH (ref 70–99)
Glucose-Capillary: 341 mg/dL — ABNORMAL HIGH (ref 70–99)

## 2019-09-11 DIAGNOSIS — R062 Wheezing: Secondary | ICD-10-CM | POA: Diagnosis not present

## 2019-09-11 DIAGNOSIS — R0602 Shortness of breath: Secondary | ICD-10-CM | POA: Diagnosis not present

## 2019-09-11 DIAGNOSIS — E119 Type 2 diabetes mellitus without complications: Secondary | ICD-10-CM | POA: Diagnosis not present

## 2019-09-11 DIAGNOSIS — E669 Obesity, unspecified: Secondary | ICD-10-CM | POA: Diagnosis not present

## 2019-09-11 DIAGNOSIS — I10 Essential (primary) hypertension: Secondary | ICD-10-CM | POA: Diagnosis not present

## 2019-09-11 DIAGNOSIS — Z6841 Body Mass Index (BMI) 40.0 and over, adult: Secondary | ICD-10-CM | POA: Diagnosis not present

## 2019-09-11 DIAGNOSIS — R0981 Nasal congestion: Secondary | ICD-10-CM | POA: Diagnosis not present

## 2019-09-11 DIAGNOSIS — E1165 Type 2 diabetes mellitus with hyperglycemia: Secondary | ICD-10-CM | POA: Diagnosis not present

## 2019-09-11 DIAGNOSIS — R000 Tachycardia, unspecified: Secondary | ICD-10-CM | POA: Diagnosis not present

## 2019-09-11 DIAGNOSIS — R0603 Acute respiratory distress: Secondary | ICD-10-CM | POA: Diagnosis not present

## 2019-09-12 DIAGNOSIS — Z7689 Persons encountering health services in other specified circumstances: Secondary | ICD-10-CM | POA: Diagnosis not present

## 2019-09-13 ENCOUNTER — Encounter (HOSPITAL_COMMUNITY): Payer: Self-pay

## 2019-09-13 ENCOUNTER — Other Ambulatory Visit: Payer: Self-pay

## 2019-09-13 ENCOUNTER — Emergency Department (HOSPITAL_COMMUNITY): Payer: Medicare HMO

## 2019-09-13 ENCOUNTER — Inpatient Hospital Stay (HOSPITAL_COMMUNITY)
Admission: EM | Admit: 2019-09-13 | Discharge: 2019-09-18 | DRG: 190 | Disposition: A | Discharge status: Home / Self Care | Payer: Medicare HMO | Attending: Family Medicine | Admitting: Family Medicine

## 2019-09-13 DIAGNOSIS — N1831 Chronic kidney disease, stage 3a: Secondary | ICD-10-CM | POA: Diagnosis present

## 2019-09-13 DIAGNOSIS — I5031 Acute diastolic (congestive) heart failure: Secondary | ICD-10-CM | POA: Diagnosis present

## 2019-09-13 DIAGNOSIS — K429 Umbilical hernia without obstruction or gangrene: Secondary | ICD-10-CM | POA: Diagnosis present

## 2019-09-13 DIAGNOSIS — R0602 Shortness of breath: Secondary | ICD-10-CM | POA: Diagnosis not present

## 2019-09-13 DIAGNOSIS — F40240 Claustrophobia: Secondary | ICD-10-CM | POA: Diagnosis present

## 2019-09-13 DIAGNOSIS — I252 Old myocardial infarction: Secondary | ICD-10-CM

## 2019-09-13 DIAGNOSIS — J9601 Acute respiratory failure with hypoxia: Secondary | ICD-10-CM | POA: Diagnosis present

## 2019-09-13 DIAGNOSIS — R600 Localized edema: Secondary | ICD-10-CM | POA: Diagnosis present

## 2019-09-13 DIAGNOSIS — J189 Pneumonia, unspecified organism: Secondary | ICD-10-CM

## 2019-09-13 DIAGNOSIS — G4733 Obstructive sleep apnea (adult) (pediatric): Secondary | ICD-10-CM | POA: Diagnosis present

## 2019-09-13 DIAGNOSIS — I130 Hypertensive heart and chronic kidney disease with heart failure and stage 1 through stage 4 chronic kidney disease, or unspecified chronic kidney disease: Secondary | ICD-10-CM | POA: Diagnosis present

## 2019-09-13 DIAGNOSIS — Z20828 Contact with and (suspected) exposure to other viral communicable diseases: Secondary | ICD-10-CM | POA: Diagnosis present

## 2019-09-13 DIAGNOSIS — F419 Anxiety disorder, unspecified: Secondary | ICD-10-CM | POA: Diagnosis present

## 2019-09-13 DIAGNOSIS — E785 Hyperlipidemia, unspecified: Secondary | ICD-10-CM | POA: Diagnosis present

## 2019-09-13 DIAGNOSIS — D631 Anemia in chronic kidney disease: Secondary | ICD-10-CM | POA: Diagnosis present

## 2019-09-13 DIAGNOSIS — G8929 Other chronic pain: Secondary | ICD-10-CM | POA: Diagnosis present

## 2019-09-13 DIAGNOSIS — Z79899 Other long term (current) drug therapy: Secondary | ICD-10-CM

## 2019-09-13 DIAGNOSIS — F0390 Unspecified dementia without behavioral disturbance: Secondary | ICD-10-CM | POA: Diagnosis present

## 2019-09-13 DIAGNOSIS — Z9641 Presence of insulin pump (external) (internal): Secondary | ICD-10-CM | POA: Diagnosis present

## 2019-09-13 DIAGNOSIS — E1151 Type 2 diabetes mellitus with diabetic peripheral angiopathy without gangrene: Secondary | ICD-10-CM | POA: Diagnosis present

## 2019-09-13 DIAGNOSIS — R0689 Other abnormalities of breathing: Secondary | ICD-10-CM | POA: Diagnosis not present

## 2019-09-13 DIAGNOSIS — K219 Gastro-esophageal reflux disease without esophagitis: Secondary | ICD-10-CM | POA: Diagnosis present

## 2019-09-13 DIAGNOSIS — Z955 Presence of coronary angioplasty implant and graft: Secondary | ICD-10-CM

## 2019-09-13 DIAGNOSIS — E1122 Type 2 diabetes mellitus with diabetic chronic kidney disease: Secondary | ICD-10-CM | POA: Diagnosis present

## 2019-09-13 DIAGNOSIS — E876 Hypokalemia: Secondary | ICD-10-CM | POA: Diagnosis present

## 2019-09-13 DIAGNOSIS — G4730 Sleep apnea, unspecified: Secondary | ICD-10-CM | POA: Diagnosis present

## 2019-09-13 DIAGNOSIS — Z9981 Dependence on supplemental oxygen: Secondary | ICD-10-CM

## 2019-09-13 DIAGNOSIS — I2510 Atherosclerotic heart disease of native coronary artery without angina pectoris: Secondary | ICD-10-CM | POA: Diagnosis present

## 2019-09-13 DIAGNOSIS — I2582 Chronic total occlusion of coronary artery: Secondary | ICD-10-CM | POA: Diagnosis present

## 2019-09-13 DIAGNOSIS — Z794 Long term (current) use of insulin: Secondary | ICD-10-CM

## 2019-09-13 DIAGNOSIS — F329 Major depressive disorder, single episode, unspecified: Secondary | ICD-10-CM | POA: Diagnosis present

## 2019-09-13 DIAGNOSIS — R062 Wheezing: Secondary | ICD-10-CM | POA: Diagnosis not present

## 2019-09-13 DIAGNOSIS — J441 Chronic obstructive pulmonary disease with (acute) exacerbation: Principal | ICD-10-CM | POA: Diagnosis present

## 2019-09-13 DIAGNOSIS — R000 Tachycardia, unspecified: Secondary | ICD-10-CM | POA: Diagnosis not present

## 2019-09-13 DIAGNOSIS — N183 Chronic kidney disease, stage 3 unspecified: Secondary | ICD-10-CM | POA: Diagnosis present

## 2019-09-13 DIAGNOSIS — Z9861 Coronary angioplasty status: Secondary | ICD-10-CM

## 2019-09-13 DIAGNOSIS — I69334 Monoplegia of upper limb following cerebral infarction affecting left non-dominant side: Secondary | ICD-10-CM

## 2019-09-13 DIAGNOSIS — E669 Obesity, unspecified: Secondary | ICD-10-CM | POA: Diagnosis present

## 2019-09-13 DIAGNOSIS — Z8249 Family history of ischemic heart disease and other diseases of the circulatory system: Secondary | ICD-10-CM

## 2019-09-13 DIAGNOSIS — I4510 Unspecified right bundle-branch block: Secondary | ICD-10-CM | POA: Diagnosis not present

## 2019-09-13 DIAGNOSIS — Z7982 Long term (current) use of aspirin: Secondary | ICD-10-CM

## 2019-09-13 DIAGNOSIS — Z6839 Body mass index (BMI) 39.0-39.9, adult: Secondary | ICD-10-CM

## 2019-09-13 DIAGNOSIS — J449 Chronic obstructive pulmonary disease, unspecified: Secondary | ICD-10-CM | POA: Diagnosis not present

## 2019-09-13 DIAGNOSIS — Z823 Family history of stroke: Secondary | ICD-10-CM

## 2019-09-13 DIAGNOSIS — Z7902 Long term (current) use of antithrombotics/antiplatelets: Secondary | ICD-10-CM

## 2019-09-13 LAB — COMPREHENSIVE METABOLIC PANEL
ALT: 32 U/L (ref 0–44)
AST: 30 U/L (ref 15–41)
Albumin: 3.7 g/dL (ref 3.5–5.0)
Alkaline Phosphatase: 62 U/L (ref 38–126)
Anion gap: 9 (ref 5–15)
BUN: 13 mg/dL (ref 8–23)
CO2: 25 mmol/L (ref 22–32)
Calcium: 9.4 mg/dL (ref 8.9–10.3)
Chloride: 108 mmol/L (ref 98–111)
Creatinine, Ser: 1.27 mg/dL — ABNORMAL HIGH (ref 0.61–1.24)
GFR calc Af Amer: >60 mL/min (ref >60)
GFR calc non Af Amer: 58 mL/min — ABNORMAL LOW (ref >60)
Glucose, Bld: 71 mg/dL (ref 70–99)
Potassium: 3.3 mmol/L — ABNORMAL LOW (ref 3.5–5.1)
Sodium: 142 mmol/L (ref 135–145)
Total Bilirubin: 0.2 mg/dL — ABNORMAL LOW (ref 0.3–1.2)
Total Protein: 6.5 g/dL (ref 6.5–8.1)

## 2019-09-13 LAB — URINALYSIS, ROUTINE W REFLEX MICROSCOPIC
Bacteria, UA: NONE SEEN
Bilirubin Urine: NEGATIVE
Glucose, UA: 150 mg/dL — AB
Hgb urine dipstick: NEGATIVE
Ketones, ur: NEGATIVE mg/dL
Leukocytes,Ua: NEGATIVE
Nitrite: NEGATIVE
Protein, ur: 100 mg/dL — AB
Specific Gravity, Urine: 1.026 (ref 1.005–1.030)
pH: 5 (ref 5.0–8.0)

## 2019-09-13 LAB — BRAIN NATRIURETIC PEPTIDE: B Natriuretic Peptide: 44 pg/mL (ref 0.0–100.0)

## 2019-09-13 LAB — GLUCOSE, CAPILLARY: Glucose-Capillary: 468 mg/dL — ABNORMAL HIGH (ref 70–99)

## 2019-09-13 LAB — CBC WITH DIFFERENTIAL/PLATELET
Abs Immature Granulocytes: 0.04 10*3/uL (ref 0.00–0.07)
Basophils Absolute: 0 10*3/uL (ref 0.0–0.1)
Basophils Relative: 1 %
Eosinophils Absolute: 0.6 10*3/uL — ABNORMAL HIGH (ref 0.0–0.5)
Eosinophils Relative: 7 %
HCT: 38.2 % — ABNORMAL LOW (ref 39.0–52.0)
Hemoglobin: 12.5 g/dL — ABNORMAL LOW (ref 13.0–17.0)
Immature Granulocytes: 1 %
Lymphocytes Relative: 16 %
Lymphs Abs: 1.3 10*3/uL (ref 0.7–4.0)
MCH: 29.9 pg (ref 26.0–34.0)
MCHC: 32.7 g/dL (ref 30.0–36.0)
MCV: 91.4 fL (ref 80.0–100.0)
Monocytes Absolute: 0.8 10*3/uL (ref 0.1–1.0)
Monocytes Relative: 9 %
Neutro Abs: 5.5 10*3/uL (ref 1.7–7.7)
Neutrophils Relative %: 66 %
Platelets: 186 10*3/uL (ref 150–400)
RBC: 4.18 MIL/uL — ABNORMAL LOW (ref 4.22–5.81)
RDW: 14.8 % (ref 11.5–15.5)
WBC: 8.3 10*3/uL (ref 4.0–10.5)
nRBC: 0 % (ref 0.0–0.2)

## 2019-09-13 LAB — POC SARS CORONAVIRUS 2 AG -  ED: SARS Coronavirus 2 Ag: NEGATIVE

## 2019-09-13 LAB — TROPONIN I (HIGH SENSITIVITY): Troponin I (High Sensitivity): 13 ng/L (ref <18)

## 2019-09-13 LAB — LACTIC ACID, PLASMA: Lactic Acid, Venous: 2.1 mmol/L (ref 0.5–1.9)

## 2019-09-13 LAB — D-DIMER, QUANTITATIVE: D-Dimer, Quant: 0.56 ug/mL-FEU — ABNORMAL HIGH (ref 0.00–0.50)

## 2019-09-13 LAB — LIPASE, BLOOD: Lipase: 20 U/L (ref 11–51)

## 2019-09-13 LAB — MAGNESIUM: Magnesium: 1.9 mg/dL (ref 1.7–2.4)

## 2019-09-13 MED ORDER — POTASSIUM CHLORIDE CRYS ER 20 MEQ PO TBCR
20.0000 meq | EXTENDED_RELEASE_TABLET | Freq: Two times a day (BID) | ORAL | Status: DC
  Administered 2019-09-13 – 2019-09-15 (×4): 20 meq via ORAL
  Filled 2019-09-13 (×4): qty 1

## 2019-09-13 MED ORDER — NITROGLYCERIN 0.4 MG SL SUBL: 0.4000 mg | SUBLINGUAL_TABLET | SUBLINGUAL | Status: DC | PRN

## 2019-09-13 MED ORDER — HYDROCODONE-ACETAMINOPHEN 7.5-325 MG PO TABS
1.0000 | ORAL_TABLET | ORAL | Status: DC | PRN
  Administered 2019-09-13 – 2019-09-18 (×9): 1 via ORAL
  Filled 2019-09-13 (×9): qty 1

## 2019-09-13 MED ORDER — ALBUTEROL SULFATE HFA 108 (90 BASE) MCG/ACT IN AERS
2.0000 | INHALATION_SPRAY | Freq: Once | RESPIRATORY_TRACT | Status: AC
  Administered 2019-09-13: 2 via RESPIRATORY_TRACT
  Filled 2019-09-13: qty 6.7

## 2019-09-13 MED ORDER — FAMOTIDINE 20 MG PO TABS
10.0000 mg | ORAL_TABLET | Freq: Every day | ORAL | Status: DC
  Administered 2019-09-13 – 2019-09-17 (×5): 10 mg via ORAL
  Filled 2019-09-13 (×5): qty 1

## 2019-09-13 MED ORDER — IPRATROPIUM BROMIDE HFA 17 MCG/ACT IN AERS
2.0000 | INHALATION_SPRAY | Freq: Once | RESPIRATORY_TRACT | Status: AC
  Administered 2019-09-13: 2 via RESPIRATORY_TRACT
  Filled 2019-09-13: qty 12.9

## 2019-09-13 MED ORDER — ENOXAPARIN SODIUM 40 MG/0.4ML ~~LOC~~ SOLN
40.0000 mg | SUBCUTANEOUS | Status: DC
  Administered 2019-09-13: 40 mg via SUBCUTANEOUS
  Filled 2019-09-13: qty 0.4

## 2019-09-13 MED ORDER — ALBUTEROL SULFATE HFA 108 (90 BASE) MCG/ACT IN AERS
2.0000 | INHALATION_SPRAY | Freq: Once | RESPIRATORY_TRACT | Status: AC
  Administered 2019-09-13: 2 via RESPIRATORY_TRACT
  Filled 2019-09-13 (×2): qty 6.7

## 2019-09-13 MED ORDER — ALBUTEROL SULFATE (2.5 MG/3ML) 0.083% IN NEBU: 2.5000 mg | INHALATION_SOLUTION | RESPIRATORY_TRACT | Status: DC | PRN

## 2019-09-13 MED ORDER — CLOPIDOGREL BISULFATE 75 MG PO TABS
75.0000 mg | ORAL_TABLET | Freq: Every day | ORAL | Status: DC
  Administered 2019-09-14 – 2019-09-18 (×5): 75 mg via ORAL
  Filled 2019-09-13 (×5): qty 1

## 2019-09-13 MED ORDER — POTASSIUM CHLORIDE CRYS ER 20 MEQ PO TBCR
40.0000 meq | EXTENDED_RELEASE_TABLET | ORAL | Status: AC
  Administered 2019-09-13: 40 meq via ORAL
  Filled 2019-09-13: qty 2

## 2019-09-13 MED ORDER — IOHEXOL 350 MG/ML SOLN
75.0000 mL | Freq: Once | INTRAVENOUS | Status: AC | PRN
  Administered 2019-09-13: 75 mL via INTRAVENOUS

## 2019-09-13 MED ORDER — FOLIC ACID 1 MG PO TABS
1.0000 mg | ORAL_TABLET | Freq: Every day | ORAL | Status: DC
  Administered 2019-09-14 – 2019-09-18 (×5): 1 mg via ORAL
  Filled 2019-09-13 (×6): qty 1

## 2019-09-13 MED ORDER — METHYLPREDNISOLONE SODIUM SUCC 125 MG IJ SOLR
125.0000 mg | Freq: Once | INTRAMUSCULAR | Status: AC
  Administered 2019-09-13: 125 mg via INTRAVENOUS
  Filled 2019-09-13: qty 2

## 2019-09-13 MED ORDER — INSULIN ASPART 100 UNIT/ML ~~LOC~~ SOLN: 10.0000 [IU] | SUBCUTANEOUS | Status: DC

## 2019-09-13 MED ORDER — ONDANSETRON HCL 4 MG/2ML IJ SOLN
4.0000 mg | Freq: Four times a day (QID) | INTRAMUSCULAR | Status: DC | PRN
  Administered 2019-09-13: 4 mg via INTRAVENOUS
  Filled 2019-09-13: qty 2

## 2019-09-13 MED ORDER — UMECLIDINIUM-VILANTEROL 62.5-25 MCG/INH IN AEPB
1.0000 | INHALATION_SPRAY | Freq: Every day | RESPIRATORY_TRACT | Status: DC
  Administered 2019-09-14 – 2019-09-18 (×5): 1 via RESPIRATORY_TRACT
  Filled 2019-09-13: qty 14

## 2019-09-13 MED ORDER — ACETAMINOPHEN 325 MG PO TABS
650.0000 mg | ORAL_TABLET | Freq: Four times a day (QID) | ORAL | Status: DC | PRN
  Filled 2019-09-13: qty 2

## 2019-09-13 MED ORDER — METOPROLOL SUCCINATE ER 100 MG PO TB24
100.0000 mg | ORAL_TABLET | Freq: Every day | ORAL | Status: DC
  Administered 2019-09-14 – 2019-09-18 (×5): 100 mg via ORAL
  Filled 2019-09-13 (×5): qty 1

## 2019-09-13 MED ORDER — INSULIN REGULAR HUMAN (CONC) 500 UNIT/ML ~~LOC~~ SOPN
15.0000 [IU] | PEN_INJECTOR | Freq: Every day | SUBCUTANEOUS | Status: DC
  Filled 2019-09-13: qty 3

## 2019-09-13 MED ORDER — ASPIRIN 81 MG PO CHEW
81.0000 mg | CHEWABLE_TABLET | Freq: Every day | ORAL | Status: DC
  Administered 2019-09-14 – 2019-09-18 (×5): 81 mg via ORAL
  Filled 2019-09-13 (×5): qty 1

## 2019-09-13 MED ORDER — INSULIN REGULAR HUMAN (CONC) 500 UNIT/ML ~~LOC~~ SOPN
10.0000 [IU] | PEN_INJECTOR | Freq: Two times a day (BID) | SUBCUTANEOUS | Status: DC
  Filled 2019-09-13: qty 3

## 2019-09-13 MED ORDER — ONDANSETRON HCL 4 MG PO TABS: 4.0000 mg | ORAL_TABLET | Freq: Four times a day (QID) | ORAL | Status: DC | PRN

## 2019-09-13 MED ORDER — INSULIN REGULAR HUMAN (CONC) 500 UNIT/ML ~~LOC~~ SOPN: 15.0000 [IU] | PEN_INJECTOR | Freq: Every day | SUBCUTANEOUS | Status: DC

## 2019-09-13 MED ORDER — PREDNISONE 20 MG PO TABS: 40.0000 mg | ORAL_TABLET | Freq: Every day | ORAL | Status: DC

## 2019-09-13 MED ORDER — AMLODIPINE BESYLATE 5 MG PO TABS
2.5000 mg | ORAL_TABLET | Freq: Every day | ORAL | Status: DC
  Administered 2019-09-14 – 2019-09-18 (×5): 2.5 mg via ORAL
  Filled 2019-09-13 (×5): qty 1

## 2019-09-13 MED ORDER — PREGABALIN 75 MG PO CAPS
75.0000 mg | ORAL_CAPSULE | Freq: Three times a day (TID) | ORAL | Status: DC
  Administered 2019-09-13 – 2019-09-18 (×16): 75 mg via ORAL
  Filled 2019-09-13: qty 3
  Filled 2019-09-13 (×15): qty 1

## 2019-09-13 MED ORDER — INSULIN ASPART 100 UNIT/ML ~~LOC~~ SOLN
5.0000 [IU] | Freq: Once | SUBCUTANEOUS | Status: AC
  Administered 2019-09-13: 5 [IU] via SUBCUTANEOUS

## 2019-09-13 MED ORDER — FUROSEMIDE 10 MG/ML IJ SOLN
80.0000 mg | INTRAMUSCULAR | Status: AC
  Administered 2019-09-13: 80 mg via INTRAVENOUS
  Filled 2019-09-13: qty 8

## 2019-09-13 MED ORDER — TRAZODONE HCL 50 MG PO TABS
25.0000 mg | ORAL_TABLET | Freq: Every evening | ORAL | Status: DC | PRN
  Administered 2019-09-17: 25 mg via ORAL
  Filled 2019-09-13: qty 1

## 2019-09-13 MED ORDER — QUETIAPINE FUMARATE 300 MG PO TABS
300.0000 mg | ORAL_TABLET | Freq: Every day | ORAL | Status: DC
  Administered 2019-09-13 – 2019-09-17 (×5): 300 mg via ORAL
  Filled 2019-09-13: qty 1
  Filled 2019-09-13: qty 3
  Filled 2019-09-13 (×3): qty 1
  Filled 2019-09-13 (×4): qty 3
  Filled 2019-09-13 (×2): qty 1

## 2019-09-13 MED ORDER — METHYLPREDNISOLONE SODIUM SUCC 125 MG IJ SOLR
60.0000 mg | Freq: Four times a day (QID) | INTRAMUSCULAR | Status: DC
  Administered 2019-09-13 – 2019-09-14 (×3): 60 mg via INTRAVENOUS
  Filled 2019-09-13 (×3): qty 2

## 2019-09-13 MED ORDER — KETOROLAC TROMETHAMINE 15 MG/ML IJ SOLN
15.0000 mg | Freq: Four times a day (QID) | INTRAMUSCULAR | Status: DC | PRN
  Administered 2019-09-13: 15 mg via INTRAVENOUS
  Filled 2019-09-13: qty 1

## 2019-09-13 MED ORDER — MAGNESIUM SULFATE 2 GM/50ML IV SOLN
2.0000 g | Freq: Once | INTRAVENOUS | Status: AC
  Administered 2019-09-13: 2 g via INTRAVENOUS
  Filled 2019-09-13: qty 50

## 2019-09-13 MED ORDER — DOXYCYCLINE HYCLATE 100 MG PO TABS
100.0000 mg | ORAL_TABLET | Freq: Two times a day (BID) | ORAL | Status: DC
  Administered 2019-09-13 – 2019-09-16 (×6): 100 mg via ORAL
  Filled 2019-09-13 (×7): qty 1

## 2019-09-13 MED ORDER — ACETAMINOPHEN 650 MG RE SUPP: 650.0000 mg | Freq: Four times a day (QID) | RECTAL | Status: DC | PRN

## 2019-09-13 MED ORDER — AMMONIUM LACTATE 12 % EX LOTN
TOPICAL_LOTION | Freq: Two times a day (BID) | CUTANEOUS | Status: DC
  Administered 2019-09-13: 1 via TOPICAL
  Administered 2019-09-14: 2 via TOPICAL
  Administered 2019-09-14: 10:00:00 via TOPICAL
  Administered 2019-09-15 (×2): 1 via TOPICAL
  Administered 2019-09-18: 11:00:00 via TOPICAL
  Filled 2019-09-13: qty 225
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
## 2019-09-13 NOTE — ED Triage Notes (Signed)
Patient reports shortness of breath x2 weeks that has worseneld in the last week. The patient states he is unable to seep because of discomfort. Patient has labored breathing

## 2019-09-13 NOTE — ED Triage Notes (Signed)
PT sitting up in the bed eating Dinner

## 2019-09-13 NOTE — Progress Notes (Signed)
CRITICAL VALUE ALERT  Critical Value:  Blood sugar 468  Date & Time Notied: 09/13/19, 2145  Provider Notified:  Jeannette Corpus NP   Orders Received/Actions taken: Novolog 5 Unit.

## 2019-09-13 NOTE — Plan of Care (Signed)
Poc progressing.  
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
## 2019-09-14 DIAGNOSIS — E1151 Type 2 diabetes mellitus with diabetic peripheral angiopathy without gangrene: Secondary | ICD-10-CM | POA: Diagnosis present

## 2019-09-14 DIAGNOSIS — J9601 Acute respiratory failure with hypoxia: Secondary | ICD-10-CM | POA: Diagnosis present

## 2019-09-14 DIAGNOSIS — D631 Anemia in chronic kidney disease: Secondary | ICD-10-CM | POA: Diagnosis present

## 2019-09-14 DIAGNOSIS — J441 Chronic obstructive pulmonary disease with (acute) exacerbation: Secondary | ICD-10-CM | POA: Diagnosis present

## 2019-09-14 DIAGNOSIS — F329 Major depressive disorder, single episode, unspecified: Secondary | ICD-10-CM | POA: Diagnosis present

## 2019-09-14 DIAGNOSIS — G8929 Other chronic pain: Secondary | ICD-10-CM | POA: Diagnosis present

## 2019-09-14 DIAGNOSIS — F40240 Claustrophobia: Secondary | ICD-10-CM | POA: Diagnosis present

## 2019-09-14 DIAGNOSIS — Z20828 Contact with and (suspected) exposure to other viral communicable diseases: Secondary | ICD-10-CM | POA: Diagnosis present

## 2019-09-14 DIAGNOSIS — E785 Hyperlipidemia, unspecified: Secondary | ICD-10-CM | POA: Diagnosis present

## 2019-09-14 DIAGNOSIS — E876 Hypokalemia: Secondary | ICD-10-CM | POA: Diagnosis present

## 2019-09-14 DIAGNOSIS — E669 Obesity, unspecified: Secondary | ICD-10-CM | POA: Diagnosis present

## 2019-09-14 DIAGNOSIS — F419 Anxiety disorder, unspecified: Secondary | ICD-10-CM | POA: Diagnosis present

## 2019-09-14 DIAGNOSIS — K429 Umbilical hernia without obstruction or gangrene: Secondary | ICD-10-CM | POA: Diagnosis present

## 2019-09-14 DIAGNOSIS — F0390 Unspecified dementia without behavioral disturbance: Secondary | ICD-10-CM | POA: Diagnosis present

## 2019-09-14 DIAGNOSIS — I5031 Acute diastolic (congestive) heart failure: Secondary | ICD-10-CM | POA: Diagnosis present

## 2019-09-14 DIAGNOSIS — N1831 Chronic kidney disease, stage 3a: Secondary | ICD-10-CM | POA: Diagnosis present

## 2019-09-14 DIAGNOSIS — G4733 Obstructive sleep apnea (adult) (pediatric): Secondary | ICD-10-CM | POA: Diagnosis present

## 2019-09-14 DIAGNOSIS — K219 Gastro-esophageal reflux disease without esophagitis: Secondary | ICD-10-CM | POA: Diagnosis present

## 2019-09-14 DIAGNOSIS — E1122 Type 2 diabetes mellitus with diabetic chronic kidney disease: Secondary | ICD-10-CM | POA: Diagnosis present

## 2019-09-14 DIAGNOSIS — I252 Old myocardial infarction: Secondary | ICD-10-CM | POA: Diagnosis not present

## 2019-09-14 DIAGNOSIS — I2582 Chronic total occlusion of coronary artery: Secondary | ICD-10-CM | POA: Diagnosis present

## 2019-09-14 DIAGNOSIS — I130 Hypertensive heart and chronic kidney disease with heart failure and stage 1 through stage 4 chronic kidney disease, or unspecified chronic kidney disease: Secondary | ICD-10-CM | POA: Diagnosis present

## 2019-09-14 DIAGNOSIS — I2510 Atherosclerotic heart disease of native coronary artery without angina pectoris: Secondary | ICD-10-CM | POA: Diagnosis present

## 2019-09-14 DIAGNOSIS — I69334 Monoplegia of upper limb following cerebral infarction affecting left non-dominant side: Secondary | ICD-10-CM | POA: Diagnosis not present

## 2019-09-14 LAB — BASIC METABOLIC PANEL
Anion gap: 14 (ref 5–15)
Anion gap: 15 (ref 5–15)
Anion gap: 13 (ref 5–15)
BUN: 23 mg/dL (ref 8–23)
BUN: 36 mg/dL — ABNORMAL HIGH (ref 8–23)
BUN: 41 mg/dL — ABNORMAL HIGH (ref 8–23)
CO2: 25 mmol/L (ref 22–32)
CO2: 22 mmol/L (ref 22–32)
CO2: 26 mmol/L (ref 22–32)
Calcium: 9.1 mg/dL (ref 8.9–10.3)
Calcium: 8.8 mg/dL — ABNORMAL LOW (ref 8.9–10.3)
Calcium: 9 mg/dL (ref 8.9–10.3)
Chloride: 99 mmol/L (ref 98–111)
Chloride: 95 mmol/L — ABNORMAL LOW (ref 98–111)
Chloride: 95 mmol/L — ABNORMAL LOW (ref 98–111)
Creatinine, Ser: 1.56 mg/dL — ABNORMAL HIGH (ref 0.61–1.24)
Creatinine, Ser: 1.87 mg/dL — ABNORMAL HIGH (ref 0.61–1.24)
Creatinine, Ser: 1.9 mg/dL — ABNORMAL HIGH (ref 0.61–1.24)
GFR calc Af Amer: 52 mL/min — ABNORMAL LOW (ref >60)
GFR calc Af Amer: 42 mL/min — ABNORMAL LOW (ref >60)
GFR calc Af Amer: 41 mL/min — ABNORMAL LOW (ref >60)
GFR calc non Af Amer: 45 mL/min — ABNORMAL LOW (ref >60)
GFR calc non Af Amer: 36 mL/min — ABNORMAL LOW (ref >60)
GFR calc non Af Amer: 36 mL/min — ABNORMAL LOW (ref >60)
Glucose, Bld: 487 mg/dL — ABNORMAL HIGH (ref 70–99)
Glucose, Bld: 665 mg/dL (ref 70–99)
Glucose, Bld: 577 mg/dL (ref 70–99)
Potassium: 4.9 mmol/L (ref 3.5–5.1)
Potassium: 4.9 mmol/L (ref 3.5–5.1)
Potassium: 4.9 mmol/L (ref 3.5–5.1)
Sodium: 138 mmol/L (ref 135–145)
Sodium: 132 mmol/L — ABNORMAL LOW (ref 135–145)
Sodium: 134 mmol/L — ABNORMAL LOW (ref 135–145)

## 2019-09-14 LAB — CBC
HCT: 38.5 % — ABNORMAL LOW (ref 39.0–52.0)
Hemoglobin: 12.6 g/dL — ABNORMAL LOW (ref 13.0–17.0)
MCH: 29.5 pg (ref 26.0–34.0)
MCHC: 32.7 g/dL (ref 30.0–36.0)
MCV: 90.2 fL (ref 80.0–100.0)
Platelets: 175 10*3/uL (ref 150–400)
RBC: 4.27 MIL/uL (ref 4.22–5.81)
RDW: 14.9 % (ref 11.5–15.5)
WBC: 9 10*3/uL (ref 4.0–10.5)
nRBC: 0 % (ref 0.0–0.2)

## 2019-09-14 LAB — HIV ANTIBODY (ROUTINE TESTING W REFLEX): HIV Screen 4th Generation wRfx: NONREACTIVE

## 2019-09-14 LAB — GLUCOSE, CAPILLARY
Glucose-Capillary: 501 mg/dL (ref 70–99)
Glucose-Capillary: 546 mg/dL (ref 70–99)
Glucose-Capillary: >600 mg/dL (ref 70–99)
Glucose-Capillary: >600 mg/dL (ref 70–99)
Glucose-Capillary: >600 mg/dL (ref 70–99)
Glucose-Capillary: 543 mg/dL (ref 70–99)
Glucose-Capillary: 426 mg/dL — ABNORMAL HIGH (ref 70–99)

## 2019-09-14 MED ORDER — INSULIN ASPART 100 UNIT/ML ~~LOC~~ SOLN
20.0000 [IU] | Freq: Once | SUBCUTANEOUS | Status: AC
  Administered 2019-09-14: 20 [IU] via SUBCUTANEOUS

## 2019-09-14 MED ORDER — INSULIN ASPART 100 UNIT/ML ~~LOC~~ SOLN
25.0000 [IU] | Freq: Once | SUBCUTANEOUS | Status: AC
  Administered 2019-09-14: 14:00:00 25 [IU] via SUBCUTANEOUS

## 2019-09-14 MED ORDER — INSULIN ASPART 100 UNIT/ML ~~LOC~~ SOLN
0.0000 [IU] | Freq: Every day | SUBCUTANEOUS | Status: DC
  Administered 2019-09-14: 5 [IU] via SUBCUTANEOUS

## 2019-09-14 MED ORDER — INSULIN REGULAR HUMAN (CONC) 500 UNIT/ML ~~LOC~~ SOPN: 15.0000 [IU] | PEN_INJECTOR | Freq: Every day | SUBCUTANEOUS | Status: DC

## 2019-09-14 MED ORDER — INSULIN REGULAR HUMAN (CONC) 500 UNIT/ML ~~LOC~~ SOPN
25.0000 [IU] | PEN_INJECTOR | Freq: Two times a day (BID) | SUBCUTANEOUS | Status: DC
  Administered 2019-09-14: 25 [IU] via SUBCUTANEOUS

## 2019-09-14 MED ORDER — INSULIN DETEMIR 100 UNIT/ML ~~LOC~~ SOLN
20.0000 [IU] | SUBCUTANEOUS | Status: AC
  Administered 2019-09-14: 12:00:00 20 [IU] via SUBCUTANEOUS
  Filled 2019-09-14: qty 0.2

## 2019-09-14 MED ORDER — INSULIN ASPART 100 UNIT/ML ~~LOC~~ SOLN
8.0000 [IU] | Freq: Once | SUBCUTANEOUS | Status: AC
  Administered 2019-09-14: 07:00:00 8 [IU] via SUBCUTANEOUS

## 2019-09-14 MED ORDER — FUROSEMIDE 10 MG/ML IJ SOLN
60.0000 mg | Freq: Every day | INTRAMUSCULAR | Status: DC
  Administered 2019-09-14 – 2019-09-17 (×4): 60 mg via INTRAVENOUS
  Filled 2019-09-14 (×4): qty 6

## 2019-09-14 MED ORDER — PREDNISONE 20 MG PO TABS
40.0000 mg | ORAL_TABLET | Freq: Every day | ORAL | Status: DC
  Administered 2019-09-14: 40 mg via ORAL
  Filled 2019-09-14: qty 2

## 2019-09-14 MED ORDER — ENOXAPARIN SODIUM 80 MG/0.8ML ~~LOC~~ SOLN
70.0000 mg | SUBCUTANEOUS | Status: DC
  Administered 2019-09-14 – 2019-09-15 (×2): 70 mg via SUBCUTANEOUS
  Filled 2019-09-14 (×3): qty 0.8

## 2019-09-14 MED ORDER — INSULIN ASPART 100 UNIT/ML ~~LOC~~ SOLN
20.0000 [IU] | Freq: Once | SUBCUTANEOUS | Status: AC
  Administered 2019-09-14: 17:00:00 20 [IU] via SUBCUTANEOUS

## 2019-09-14 NOTE — TOC Initial Note (Signed)
Transition of Care [facility]) - Initial/Assessment Note    Patient Details  Name: Reginald Duncan MRN: [id number] Date of Birth: May 27, 1951  Transition [facility]) CM/SW Contact:    Claudie Leach, RN Phone Number: [phone] 09/14/2019, 2:03 PM  Clinical Narrative:                 Patient lives home alone and was recently d/c with [facility].  Patient uses DME cane, walker and oxygen through [employer]. Patient's only family is a brother who works 12 hour days.  Patient is interested in going to SNF .    Expected Discharge Plan: [place] Barriers to Discharge: No Barriers Identified    Expected Discharge Plan and Services Expected Discharge Plan: [place] arrangements for the past 2 months: Single Family Home                  Prior Living Arrangements/Services Living arrangements for the past 2 months: Single Family Home Lives with:: Self Patient language and need for interpreter reviewed:: Yes        Need for Family Participation in Patient Care: No (Comment) Care giver support system in place?: Yes (comment) Current home services: DME, Home PT, Home RN, Home OT, Homehealth aide(Home health of [facility]) Criminal Activity/Legal Involvement Pertinent to Current Situation/Hospitalization: No - Comment as needed  Activities of Daily Living Home Assistive Devices/Equipment: Cane (specify quad or straight), Walker (specify type) ADL Screening (condition at time of admission) Patient's cognitive ability adequate to safely complete daily activities?: Yes Is the patient deaf or have difficulty hearing?: No Does the patient have difficulty seeing, even when wearing glasses/contacts?: No Does the patient have difficulty concentrating, remembering, or making decisions?: No Patient able to express need for assistance with ADLs?: Yes Does the patient have difficulty dressing or bathing?: No Independently performs ADLs?: Yes  (appropriate for developmental age) Does the patient have difficulty walking or climbing stairs?: Yes Weakness of Legs: Both Weakness of Arms/Hands: None  Permission Sought/Granted                  Emotional Assessment Appearance:: Appears stated age     Orientation: : Oriented to Self, Oriented to Place, Oriented to  Time, Oriented to Situation Alcohol / Substance Use: Not Applicable Psych Involvement: No (comment)  Admission diagnosis:  COPD exacerbation ([place]) [J44.1] Patient Active Problem List   Diagnosis Date Noted  . Hypokalemia 09/13/2019  . Bilateral lower extremity edema 09/13/2019  . ACS (acute coronary syndrome) ([place]) 08/31/2019  . Severe obesity (BMI 35.0-39.9) with comorbidity ([place]) 08/31/2019  . Hernia of abdominal wall 08/31/2019  . Gross hematuria 08/31/2019  . Cellulitis and abscess of right lower extremity 08/31/2019  . Wide-complex tachycardia ([place]) 08/13/2018  . Acute exacerbation of chronic obstructive pulmonary disease (COPD) ([place])   . Chronic back pain   . Near syncope 08/12/2018  . DM (diabetes mellitus) ([place]) 04/10/2014  . Angina pectoris ([place]) 04/08/2014  . Depression 01/13/2013  . CKD (chronic kidney disease), stage III ([place])   . Intermediate coronary syndrome - [place] angina 12/25/2012  . CHEST PAIN UNSPECIFIED 10/20/2010  . Coronary atherosclerosis 08/04/2010  . FOREIGN BODY, SOFT TISSUE 09/03/2007  . S/P PTCA (percutaneous transluminal coronary angioplasty) 08/19/2007  . UPPER RESPIRATORY INFECTION, ACUTE, WITH BRONCHITIS 08/13/2007  . [place] NEC 08/06/2007  . DEPRESSIVE DISORDER 08/01/2007  . EDEMA- LOCALIZED 08/01/2007  . HYPERTENSION, BENIGN ESSENTIAL 07/16/2007  .  LEG, LOWER, PAIN 07/16/2007  . Diabetes ([place]) 12/20/2006  . HYPERCHOLESTEROLEMIA 12/20/2006  . HYPERTRIGLYCERIDEMIA 12/20/2006  . HYPERTENSIVE HEART DISEASE 12/20/2006  . Obesity with sleep apnea 12/20/2006  . PROTEINURIA 12/20/2006   PCP:  [facility],  [place]:   [id number] - [place], [address] [place] [address] Phone: [phone] Fax: [phone]

## 2019-09-14 NOTE — Evaluation (Signed)
Occupational Therapy Evaluation Patient Details Name: Reginald Duncan MRN: [id number] DOB: [birth year] Today's Date: 09/14/2019    History of Present Illness 68 y.o. male with medical history significant of severe COPD on occasional home oxygen, chronic kidney disease stage III, coronary artery disease status post cath 2 weeks ago, thrombotic cerebral vascular accident, central hypertension and mild depression who presents to the emergency department complaining of increasing work of breathing.   Clinical Impression   PATIENT HAS DECREASED I AND SAFETY WITH ADLS AND MOBILITY. PATIENT WOULD BENEFIT FROM FUTHER ED ON USE OF AE FOR LE ADLS. PATIENT WAS HAVING DIFFICULTY WITH LE ADLS. PNT  MAY NEED ST SNF FOR REHAB    Follow Up Recommendations  SNF;Supervision/Assistance - 24 hour    Equipment Recommendations       Recommendations for Other Services       Precautions / Restrictions Precautions Precautions: Fall Precaution Comments: knees giving way Restrictions Weight Bearing Restrictions: No      Mobility Bed Mobility Overal bed mobility: Needs Assistance Bed Mobility: Supine to Sit;Sit to Supine     Supine to sit: Min guard Sit to supine: Supervision      Transfers Overall transfer level: Needs assistance Equipment used: Rolling walker (2 wheeled) Transfers: Sit to/from Stand Sit to Stand: Min guard         General transfer comment: Pt. anbl with a rw at min guard assist level.    Balance Overall balance assessment: Needs assistance Sitting-balance support: No upper extremity supported;Feet supported Sitting balance-Leahy Scale: Good Sitting balance - Comments: modI   Standing balance support: Bilateral upper extremity supported Standing balance-Leahy Scale: Fair Standing balance comment: minG-minA with BUE support of RW                           ADL either performed or assessed with clinical judgement   ADL Overall ADL's : Needs  assistance/impaired Eating/Feeding: Independent;Sitting   Grooming: Wash/dry hands;Wash/dry face;Min guard;Standing   Upper Body Bathing: Set up;Sitting   Lower Body Bathing: Minimal assistance;Sit to/from stand   Upper Body Dressing : Minimal assistance;Sitting   Lower Body Dressing: Moderate assistance;Sit to/from stand   Toilet Transfer: Minimal assistance;Ambulation;RW   Toileting- Clothing Manipulation and Hygiene: Minimal assistance;Sit to/from stand       Functional mobility during ADLs: Minimal assistance General ADL Comments: Pt. has decreased      Vision Baseline Vision/History: Wears glasses Wears Glasses: At all times(needs to get glasses) Patient Visual Report: No change from baseline       Perception     Praxis      Pertinent Vitals/Pain Pain Assessment: 0-10 Pain Score: 7  Faces Pain Scale: Hurts whole lot Pain Location: l angle and back Pain Descriptors / Indicators: Aching;Guarding;Grimacing Pain Intervention(s): Limited activity within patient's tolerance     Hand Dominance Right   Extremity/Trunk Assessment Upper Extremity Assessment Upper Extremity Assessment: Generalized weakness   Lower Extremity Assessment Lower Extremity Assessment: Generalized weakness   Cervical / Trunk Assessment Cervical / Trunk Assessment: Kyphotic   Communication Communication Communication: No difficulties   Cognition Arousal/Alertness: Awake/alert Behavior During Therapy: WFL for tasks assessed/performed Overall Cognitive Status: History of cognitive impairments - at baseline Area of Impairment: Memory;Safety/judgement;Problem solving                     Memory: Decreased short-term memory   Safety/Judgement: Decreased awareness of safety;Decreased awareness of deficits   Problem Solving: Slow processing;Requires  verbal cues General Comments: pt reports difficulty with short term memory   General Comments  Pt tachy from 114 at rest to 135 at  end of ambulation. SpO2 ranging from 90-100 during mobility on room air    Exercises     Shoulder Instructions      Home Living Family/patient expects to be discharged to:: Private residence Living Arrangements: Alone Available Help at Discharge: Friend(s);Available PRN/intermittently Type of Home: Apartment Home Access: Elevator     Home Layout: One level     Bathroom Shower/Tub: Teacher, early years/pre: Standard     Home Equipment: Environmental consultant - 2 wheels;Shower seat;Cane - quad   Additional Comments: small based quad cane. Pt reports friend cannot come to home, only available over phone      Prior Functioning/Environment Level of Independence: Independent with assistive device(s)        Comments: uses cane for household and limited community distances. Using power scooter at stores        OT Problem List: Decreased activity tolerance;Impaired balance (sitting and/or standing);Decreased knowledge of use of DME or AE      OT Treatment/Interventions: Self-care/ADL training;DME and/or AE instruction;Therapeutic activities;Patient/family education    OT Goals(Current goals can be found in the care plan section) Acute Rehab OT Goals Patient Stated Goal: to get better OT Goal Formulation: With patient Time For Goal Achievement: 09/28/19 Potential to Achieve Goals: Good  OT Frequency: Min 2X/week   Barriers to D/C: Decreased caregiver support          Co-evaluation              AM-PAC OT "6 Clicks" Daily Activity     Outcome Measure Help from another person eating meals?: None Help from another person taking care of personal grooming?: A Little Help from another person toileting, which includes using toliet, bedpan, or urinal?: A Little Help from another person bathing (including washing, rinsing, drying)?: A Little Help from another person to put on and taking off regular upper body clothing?: A Little Help from another person to put on and taking  off regular lower body clothing?: A Lot 6 Click Score: 18   End of Session Equipment Utilized During Treatment: Gait belt;Rolling walker Nurse Communication: Patient requests pain meds  Activity Tolerance: Patient tolerated treatment well Patient left: in chair;with call bell/phone within reach  OT Visit Diagnosis: Other abnormalities of gait and mobility (R26.89);Repeated falls (R29.6);History of falling (Z91.81)                Time: [id number] OT Time Calculation (min): 41 min Charges:  OT General Charges $OT Visit: 1 Visit OT Evaluation $OT Eval Low Complexity: 1 Low OT Treatments $Self Care/Home Management : [id number] mins  6 clicks  Caylor Cerino 09/14/2019, 10:56 AM

## 2019-09-14 NOTE — Care Management Obs Status (Signed)
[place] NOTIFICATION   Patient Details  Name: [name] MRN: [id number] Date of Birth: 1950-10-31   Medicare Observation Status Notification Given:  Yes    Claudie Leach, RN 09/14/2019, 1:40 PM

## 2019-09-14 NOTE — Evaluation (Signed)
Physical Therapy Evaluation Patient Details Name: Reginald Duncan MRN: [id number] DOB: July 09, 1951 Today's Date: 09/14/2019   History of Present Illness  68 y.o. male with medical history significant of severe COPD on occasional home oxygen, chronic kidney disease stage III, coronary artery disease status post cath 2 weeks ago, thrombotic cerebral vascular accident, central hypertension and mild depression who presents to the emergency department complaining of increasing work of breathing.  Clinical Impression  Pt demonstrates deficits In functional mobility, gait, balance, endurance, strength, power, and with chronic pain. Pt reports increased work of breathing at rest and during mobility although saturating well on room air. Pt is unsteady during upright and OOB mobility, with impaired transfer control and balance. Pt has a significant falls history and initially reports having multiple falls recently but then reports only 2 since April. Pt will benefit from continued acute PT services to reduce falls risk and aide in a return to independent mobility.    Follow Up Recommendations SNF;Supervision/Assistance - 24 hour;Home health PT(HHPT if pt refuses SNF placement)    Equipment Recommendations  None recommended by PT(pt already owns necessary DME)    Recommendations for Other Services       Precautions / Restrictions Precautions Precautions: Fall Precaution Comments: knees giving way Restrictions Weight Bearing Restrictions: No      Mobility  Bed Mobility Overal bed mobility: Needs Assistance Bed Mobility: Supine to Sit;Sit to Supine     Supine to sit: Min assist;HOB elevated Sit to supine: Supervision      Transfers Overall transfer level: Needs assistance Equipment used: Rolling walker (2 wheeled) Transfers: Sit to/from Stand Sit to Stand: Min guard         General transfer comment: pt with quick and uncontrolled descent on most stand to sits. Pt reports he does this  as it is more comfortable for him  Ambulation/Gait Ambulation/Gait assistance: Min assist Gait Distance (Feet): 50 Feet Assistive device: Rolling walker (2 wheeled) Gait Pattern/deviations: Trunk flexed;Wide base of support Gait velocity: decreased Gait velocity interpretation: <1.8 ft/sec, indicate of risk for recurrent falls General Gait Details: short step to gait with widened BOS and reduced step length. Pt with increased lateral sway and 2 LOB requiring PT assist to correct  Stairs            Wheelchair Mobility    Modified Rankin (Stroke Patients Only)       Balance Overall balance assessment: Needs assistance Sitting-balance support: No upper extremity supported;Feet supported Sitting balance-Leahy Scale: Good Sitting balance - Comments: modI   Standing balance support: Bilateral upper extremity supported Standing balance-Leahy Scale: Fair Standing balance comment: minG-minA with BUE support of RW                             Pertinent Vitals/Pain Pain Assessment: 0-10 Pain Score: 9  Faces Pain Scale: Hurts whole lot Pain Location: ankles, knees, back Pain Descriptors / Indicators: Grimacing;Guarding Pain Intervention(s): Limited activity within patient's tolerance;Monitored during session    Home Living Family/patient expects to be discharged to:: Private residence Living Arrangements: Alone Available Help at Discharge: Friend(s);Available PRN/intermittently Type of Home: Apartment Home Access: Elevator     Home Layout: One level Home Equipment: Walker - 2 wheels;Shower seat;Cane - quad Additional Comments: small based quad cane. Pt reports friend cannot come to home, only available over phone    Prior Function Level of Independence: Independent with assistive device(s)  Comments: uses cane for household and limited community distances. Using power scooter at stores     Hand Dominance   Dominant Hand: Right     Extremity/Trunk Assessment   Upper Extremity Assessment Upper Extremity Assessment: Generalized weakness    Lower Extremity Assessment Lower Extremity Assessment: Generalized weakness    Cervical / Trunk Assessment Cervical / Trunk Assessment: Kyphotic  Communication   Communication: No difficulties  Cognition Arousal/Alertness: Awake/alert Behavior During Therapy: WFL for tasks assessed/performed Overall Cognitive Status: History of cognitive impairments - at baseline                                 General Comments: pt reports difficulty with short term memory      General Comments General comments (skin integrity, edema, etc.): Pt tachy from 114 at rest to 135 at end of ambulation. SpO2 ranging from 90-100 during mobility on room air    Exercises     Assessment/Plan    PT Assessment Patient needs continued PT services  PT Problem List Decreased strength;Decreased activity tolerance;Decreased balance;Decreased mobility;Decreased knowledge of use of DME;Decreased cognition;Decreased safety awareness;Cardiopulmonary status limiting activity;Pain       PT Treatment Interventions DME instruction;Gait training;Stair training;Functional mobility training;Therapeutic activities;Therapeutic exercise;Balance training;Neuromuscular re-education;Patient/family education    PT Goals (Current goals can be found in the Care Plan section)  Acute Rehab PT Goals Patient Stated Goal: To improve mobility and breathing PT Goal Formulation: With patient Time For Goal Achievement: 09/28/19 Potential to Achieve Goals: Fair Additional Goals Additional Goal #1: Pt will demonstrate the ability to maintain dynamic standing balance within 6 inches of his base of support with unilateral UE support of an assistive device.    Frequency Min 3X/week(3x as pt likely to refuse SNF placement and D/C home alone)   Barriers to discharge        Co-evaluation                AM-PAC PT "6 Clicks" Mobility  Outcome Measure Help needed turning from your back to your side while in a flat bed without using bedrails?: None Help needed moving from lying on your back to sitting on the side of a flat bed without using bedrails?: A Little Help needed moving to and from a bed to a chair (including a wheelchair)?: A Little Help needed standing up from a chair using your arms (e.g., wheelchair or bedside chair)?: A Little Help needed to walk in hospital room?: A Little Help needed climbing 3-5 steps with a railing? : Total 6 Click Score: 17    End of Session Equipment Utilized During Treatment: (none) Activity Tolerance: Patient tolerated treatment well Patient left: in bed;with call bell/phone within reach Nurse Communication: Mobility status PT Visit Diagnosis: Unsteadiness on feet (R26.81);Other abnormalities of gait and mobility (R26.89);History of falling (Z91.81);Difficulty in walking, not elsewhere classified (R26.2);Pain Pain - Right/Left: (back)    Time: [id number] PT Time Calculation (min) (ACUTE ONLY): 24 min   Charges:   PT Evaluation $PT Eval Moderate Complexity: 1 Mod          Zenaida Niece, PT, DPT Acute Rehabilitation Pager: [phone]   Zenaida Niece 09/14/2019, 10:00 AM

## 2019-09-14 NOTE — Progress Notes (Signed)
Paged on call [place] NP with [facility]G of [phone]. Asked if any additional insulin is needed including sliding scale. Also pt was upset that his cholesterol medicine Vascepa was not started and he was asking for it. NP called back and said give the sliding scale and recheck sugar in couple hours to see where he is, as far as starting his Vascepa , will hold off till am. Pt keeps asking for food. Will continue to monitor.

## 2019-09-14 NOTE — Progress Notes (Signed)
Repeat CBG >600.  Advised Dr Verlon Au. Verbal order given for 20g Novolog stat, BMET, and repeat CBG in 1 hr.  Jerald Kief, RN
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
## 2019-09-14 NOTE — Progress Notes (Signed)
CRITICAL VALUE ALERT  Critical Value: CBG 543, stat BMET glucose 577 after 20 units novolog given per day shift RN   Date & Time Notied: 09/14/19,2018  Provider Notified: Brandon Melnick NP   Orders Received/Actions taken: 20 units Novolog x1, CBG at 2200 and Night time coverage Sliding scale.

## 2019-09-14 NOTE — Progress Notes (Signed)
Paged Dr Verlon Au ref to pt CBG. Reading "HI". Verbal order for 20mg  Novolog and recheck in 1 hr. Jerald Kief, RN

## 2019-09-14 NOTE — Progress Notes (Signed)
CRITICAL VALUE ALERT  Critical Value:  CBG 501  Date & Time Notied:  09/14/2019, [id number]  Provider Notified: text paged Jeannette Corpus NP   Orders Received/Actions taken: NOvolog 8 units x 1 time.

## 2019-09-14 NOTE — Plan of Care (Signed)
PATIENT HAS DECREASED I AND SAFETY WITH ADLS AND MOBILITY. PATIENT WOULD BENEFIT FROM FUTHER ED ON USE OF AE FOR LE ADLS. PATIENT WAS HAVING DIFFICULTY WITH LE ADLS. PNT  MAY NEED ST SNF FOR REHAB

## 2019-09-14 NOTE — Progress Notes (Signed)
Hospitalist progress note   Reginald Duncan:[id number] DOB: 05/31/1951 DOA: 09/13/2019  PCP: [facility]   Narrative:  68 year old male CAD (MI 71) status post LAD stent 2014 recent admission 11/8 through 11/13 with abnormal stress test-underwent cardiac cath and medical management was recommended COPD on home oxygen CKD stage IIIa CVA Vertigo OSA/not on CPAP Low back pain status post lumbar epidural injections in the past  Admit to [facility] 09/13/2019 significant wheezing cough and increasing oxygen requirement from 2 to 4 L also has been having increasing lower extremity edema after stopping diuretics about 2 weeks ago secondary to worsening renal function  Assessment & Plan: Undifferentiated respiratory failure-BNP 44 but he is obese-he has responded to IV lasix 80--will place him on 60 IFV daily, get daily stand weights and strict I/o--review of his weight from last Hospital visit he is up 4 kg to 136-doubt COPd or PNA--hold steroids--continue doxycycline for 1-2 days then stop Recent cardiac cath medical management recommended contnue amlodipine 2.5, metoprolol 100 xl and monitro trends-cont ASa 81 plavix 75 Very poor glycemic control-likely in setting of Iv steroids--stopping steroids completely--increased U500 to 25, giving another dose 25 U now-cont lyrica 75 daily COPD-see above--in addition continue Albyuterol, Anoro Ellipta CKD stage III-follow with Dr. Lorrene Reid at [facility]-will need close OP follow up Prior CVA?-cont asa and palvix Vertigo OSA Low back pain   Subjective: Awake coherent states pain in back and abdomen from coughing Passing good urine no fever no chills some sptuum-feels more swollen than his usual baseline Consultants:   noen Procedures:   n Antimicrobials:   doxycvycline  Data Reviewed:  Labs bun/creat 23/1.56 Hemoglobin 12.6, WBC wnl Radiology Studies: Ct chest clear  Objective: Vitals:   09/13/19 2007 09/14/19 0040  09/14/19 0443 09/14/19 0445  BP: (!) 147/84 (!) 151/76 134/78   Pulse: (!) 120 (!) 114 (!) 114   Resp: (!) 22 16 19    Temp: 98.3 F (36.8 C) 98.5 F (36.9 C) 98.2 F (36.8 C)   TempSrc: Oral Oral Oral   SpO2: 96% 95% 96%   Weight: (!) 137.5 kg   (!) 136.3 kg  Height: 6\' 1"  (1.854 m)       Intake/Output Summary (Last 24 hours) at 09/14/2019 0739 Last data filed at 09/13/2019 2300 Gross per 24 hour  Intake 300 ml  Output 1800 ml  Net -1500 ml   Filed Weights   09/13/19 1044 09/13/19 2007 09/14/19 0445  Weight: 123 kg (!) 137.5 kg (!) 136.3 kg    Examination: Obese pleasan tin nad no focal deficit eomi ncat thick neck Ct ab no added sound  abd obese with hernia Le swellin bilaterally grade 1-2  Scheduled Meds: . amLODipine  2.5 mg Oral Daily  . ammonium lactate   Topical BID  . aspirin  81 mg Oral Daily  . clopidogrel  75 mg Oral Daily  . doxycycline  100 mg Oral Q12H  . enoxaparin (LOVENOX) injection  40 mg Subcutaneous Q24H  . famotidine  10 mg Oral QHS  . folic acid  1 mg Oral Daily  . furosemide  60 mg Intravenous Daily  . insulin detemir  20 Units Subcutaneous NOW  . insulin regular human CONCENTRATED  25 Units Subcutaneous BID WC  . metoprolol succinate  100 mg Oral Daily  . potassium chloride  20 mEq Oral BID  . pregabalin  75 mg Oral TID  . QUEtiapine  300 mg Oral QHS  . umeclidinium-vilanterol  1  puff Inhalation Daily   Continuous Infusions:   LOS: 0 days   Time spent: [place], MD [facility]  09/14/2019, 7:39 AM

## 2019-09-14 NOTE — Progress Notes (Signed)
Repeat CBG 543. Dr Verlon Au notified. Advises to pass along to night shift RN to call with BMET information and await orders.  Jerald Kief, RN

## 2019-09-14 NOTE — Plan of Care (Signed)
Poc progressing.  

## 2019-09-15 ENCOUNTER — Inpatient Hospital Stay (HOSPITAL_COMMUNITY): Payer: Medicare HMO

## 2019-09-15 LAB — COMPREHENSIVE METABOLIC PANEL
ALT: 32 U/L (ref 0–44)
AST: 21 U/L (ref 15–41)
Albumin: 3.8 g/dL (ref 3.5–5.0)
Alkaline Phosphatase: 66 U/L (ref 38–126)
Anion gap: 11 (ref 5–15)
BUN: 42 mg/dL — ABNORMAL HIGH (ref 8–23)
CO2: 27 mmol/L (ref 22–32)
Calcium: 8.9 mg/dL (ref 8.9–10.3)
Chloride: 99 mmol/L (ref 98–111)
Creatinine, Ser: 1.59 mg/dL — ABNORMAL HIGH (ref 0.61–1.24)
GFR calc Af Amer: 51 mL/min — ABNORMAL LOW (ref >60)
GFR calc non Af Amer: 44 mL/min — ABNORMAL LOW (ref >60)
Glucose, Bld: 501 mg/dL (ref 70–99)
Potassium: 4.5 mmol/L (ref 3.5–5.1)
Sodium: 137 mmol/L (ref 135–145)
Total Bilirubin: 0.7 mg/dL (ref 0.3–1.2)
Total Protein: 6.6 g/dL (ref 6.5–8.1)

## 2019-09-15 LAB — SARS CORONAVIRUS 2 (TAT 6-24 HRS): SARS Coronavirus 2: NEGATIVE

## 2019-09-15 LAB — GLUCOSE, CAPILLARY
Glucose-Capillary: 322 mg/dL — ABNORMAL HIGH (ref 70–99)
Glucose-Capillary: 346 mg/dL — ABNORMAL HIGH (ref 70–99)
Glucose-Capillary: 456 mg/dL — ABNORMAL HIGH (ref 70–99)
Glucose-Capillary: 480 mg/dL — ABNORMAL HIGH (ref 70–99)
Glucose-Capillary: 492 mg/dL — ABNORMAL HIGH (ref 70–99)
Glucose-Capillary: 427 mg/dL — ABNORMAL HIGH (ref 70–99)
Glucose-Capillary: 272 mg/dL — ABNORMAL HIGH (ref 70–99)

## 2019-09-15 MED ORDER — INSULIN REGULAR HUMAN (CONC) 500 UNIT/ML ~~LOC~~ SOPN
20.0000 [IU] | PEN_INJECTOR | Freq: Every day | SUBCUTANEOUS | Status: DC
  Administered 2019-09-15 – 2019-09-17 (×3): 20 [IU] via SUBCUTANEOUS
  Filled 2019-09-15 (×2): qty 3

## 2019-09-15 MED ORDER — INSULIN REGULAR HUMAN (CONC) 500 UNIT/ML ~~LOC~~ SOPN
20.0000 [IU] | PEN_INJECTOR | Freq: Every day | SUBCUTANEOUS | Status: DC
  Administered 2019-09-15 – 2019-09-16 (×2): 20 [IU] via SUBCUTANEOUS
  Filled 2019-09-15: qty 3

## 2019-09-15 MED ORDER — GLUCERNA SHAKE PO LIQD
237.0000 mL | Freq: Three times a day (TID) | ORAL | Status: DC
  Administered 2019-09-15 – 2019-09-18 (×7): 237 mL via ORAL

## 2019-09-15 MED ORDER — INSULIN ASPART 100 UNIT/ML ~~LOC~~ SOLN
0.0000 [IU] | Freq: Three times a day (TID) | SUBCUTANEOUS | Status: DC
  Administered 2019-09-15 – 2019-09-16 (×2): 20 [IU] via SUBCUTANEOUS
  Administered 2019-09-16 (×2): 11 [IU] via SUBCUTANEOUS
  Administered 2019-09-17: 15 [IU] via SUBCUTANEOUS
  Administered 2019-09-17: 20 [IU] via SUBCUTANEOUS
  Administered 2019-09-17: 7 [IU] via SUBCUTANEOUS
  Administered 2019-09-18: 15 [IU] via SUBCUTANEOUS
  Administered 2019-09-18: 07:00:00 20 [IU] via SUBCUTANEOUS

## 2019-09-15 MED ORDER — INSULIN ASPART 100 UNIT/ML ~~LOC~~ SOLN
30.0000 [IU] | Freq: Once | SUBCUTANEOUS | Status: AC
  Administered 2019-09-15: 30 [IU] via SUBCUTANEOUS

## 2019-09-15 MED ORDER — INSULIN REGULAR HUMAN (CONC) 500 UNIT/ML ~~LOC~~ SOPN
20.0000 [IU] | PEN_INJECTOR | Freq: Every day | SUBCUTANEOUS | Status: DC
  Administered 2019-09-16: 20 [IU] via SUBCUTANEOUS
  Filled 2019-09-15: qty 3

## 2019-09-15 MED ORDER — ADULT MULTIVITAMIN W/MINERALS CH
1.0000 | ORAL_TABLET | Freq: Every day | ORAL | Status: DC
  Administered 2019-09-15 – 2019-09-18 (×4): 1 via ORAL
  Filled 2019-09-15 (×4): qty 1

## 2019-09-15 MED ORDER — INSULIN REGULAR HUMAN (CONC) 500 UNIT/ML ~~LOC~~ SOPN
15.0000 [IU] | PEN_INJECTOR | Freq: Every day | SUBCUTANEOUS | Status: DC
  Filled 2019-09-15: qty 3

## 2019-09-15 MED ORDER — ICOSAPENT ETHYL 1 G PO CAPS
2.0000 g | ORAL_CAPSULE | Freq: Two times a day (BID) | ORAL | Status: DC
  Administered 2019-09-15 – 2019-09-18 (×7): 2 g via ORAL
  Filled 2019-09-15 (×8): qty 2

## 2019-09-15 MED ORDER — INSULIN REGULAR HUMAN (CONC) 500 UNIT/ML ~~LOC~~ SOPN
15.0000 [IU] | PEN_INJECTOR | Freq: Every day | SUBCUTANEOUS | Status: DC
  Administered 2019-09-15: 15 [IU] via SUBCUTANEOUS
  Filled 2019-09-15: qty 3

## 2019-09-15 NOTE — Progress Notes (Addendum)
Patient's BG 427. Dr. Verlon Au notified. New orders received.

## 2019-09-15 NOTE — Plan of Care (Signed)
Continue to monitor

## 2019-09-15 NOTE — Progress Notes (Addendum)
Hospitalist progress note   Reginald Duncan [id number] DOB: 03/27/1951 DOA: 09/13/2019  PCP: [facility]   Narrative:  68 year old male CAD (MI 68) status post LAD stent 2014 recent admission 11/8 through 11/13 with abnormal stress test-underwent cardiac cath and medical management was recommended COPD on home oxygen CKD stage IIIa CVA Vertigo OSA/not on CPAP Low back pain status post lumbar epidural injections in the past  Admit to [facility] 09/13/2019 significant wheezing cough and increasing oxygen requirement from 2 to 4 L also has been having increasing lower extremity edema after stopping diuretics about 2 weeks ago secondary to worsening renal function  Assessment & Plan: Undifferentiated respiratory failure-BNP 44 but he is obese-he has responded to IV lasix 80--will place him on 60 IV daily, weight is available 0.5 kg, -5.3 L doubt COPd or PNA--hold steroids--continue doxycycline for 1-2 days then stop Recent cardiac cath medical management recommended contnue amlodipine 2.5, metoprolol 100 xl and monitor trends-cont ASa 81 plavix 75  Very poor glycemic control-likely in setting of Iv steroids--stopping steroids completely--increased U500 from home dose to 30 U Keep SSI Expect sugars will downtrend with Steroisds wearing off  COPD-see above--in addition continue Albuterol, Anoro Ellipta  CKD stage III-follow with Dr. Lorrene Reid at [facility]-will need close OP follow up Monitor trends closely have increased diuresis and expect that creatinine will bump  Prior CVA?-cont asa and palvix  Vertigo OSA Low back pain   Subjective:  States cough most of the night sputum he also had some very bad dreams and woke up with the start Otherwise he is feeling fair Definitely feels less swollen than prior  Consultants:   noen Procedures:   n Antimicrobials:   doxycvycline  Data Reviewed:  No labs today  Radiology Studies: Ct chest  clear  Objective: Vitals:   09/14/19 2024 09/14/19 2145 09/15/19 0620 09/15/19 0821  BP: 137/77  135/82   Pulse: 97 96 93   Resp: 13 14 18    Temp: 97.7 F (36.5 C)  97.7 F (36.5 C)   TempSrc: Oral  Oral   SpO2: 99% 99% 95% 97%  Weight:   135.7 kg   Height:        Intake/Output Summary (Last 24 hours) at 09/15/2019 0855 Last data filed at 09/15/2019 0400 Gross per 24 hour  Intake 200 ml  Output 5550 ml  Net -5350 ml   Filed Weights   09/13/19 2007 09/14/19 0445 09/15/19 0620  Weight: (!) 137.5 kg (!) 136.3 kg 135.7 kg    Examination: Awake alert coherent some coughing this morning S1-S2 no murmur rub or gallop PMI displaced Abdomen obese multiple hernias no rebound no guarding however Chest is relatively clear but poor exam given habitus Lower extremities are less swollen than prior Does have some wounds on his anterior shins Neurologically intact moving all 4 limbs  Scheduled Meds: . amLODipine  2.5 mg Oral Daily  . ammonium lactate   Topical BID  . aspirin  81 mg Oral Daily  . clopidogrel  75 mg Oral Daily  . doxycycline  100 mg Oral Q12H  . enoxaparin (LOVENOX) injection  70 mg Subcutaneous Q24H  . famotidine  10 mg Oral QHS  . folic acid  1 mg Oral Daily  . furosemide  60 mg Intravenous Daily  . insulin aspart  0-5 Units Subcutaneous QHS  . metoprolol succinate  100 mg Oral Daily  . potassium chloride  20 mEq Oral BID  . pregabalin  75 mg  Oral TID  . QUEtiapine  300 mg Oral QHS  . umeclidinium-vilanterol  1 puff Inhalation Daily   Continuous Infusions:   LOS: 1 day   Time spent: [place], MD [facility]  09/15/2019, 8:55 AM

## 2019-09-15 NOTE — Progress Notes (Signed)
SLP Cancellation Note  Patient Details Name: Reginald Duncan MRN: [id number] DOB: [birth year]   Cancelled treatment:       Reason Eval/Treat Not Completed: Medical issues which prohibited therapy. Pt has Covid test results pending. Will hold MBS for today and assess appropriateness once those results are available. RN, MD and radiology are aware.  MBS is recommended to rule out silent aspiration. Pt currently receiving regular diet and thin liquids, and did not exhibit overt s/s aspiration on bedside evaluation. RN reports pt tolerating current diet and meds well.   Carmela Rima, [place] Speech Language Pathologist Office: [phone] Pager: [phone]  Shonna Chock 09/15/2019, 1:48 PM

## 2019-09-15 NOTE — Progress Notes (Signed)
Pts CBG 322, Text paged On call triad NP K Sofia and updated the result. Will continue to monitor.

## 2019-09-15 NOTE — Progress Notes (Signed)
Insulin U-500 Confirmation  Spoke with patient who stated that he uses a U-500 insulin syringe but upon further questions he states that the actual dose given is 5 times what the syringe says. I believe he actually uses a U-100 syringe, which matches the doses he reports.  Patient states he takes U-500 insulin: 10 units in the morning, 15 units in the afternoon and 15 units at night regardless of what his sugars are. He does not use an insulin sliding scale.   Of note, to the Pharmacy technician that did his medication hx on 11/21, he reported he takes 10 units in the morning, 15 units in the afternoon and 10 units at night.   Unable to clarify discrepancy via chart review.  Spoke with Dr. Verlon Au who decided to give 20 units in the morning, 15 units in the afternoon, and 15 units at night given his high blood sugars here. Orders entered. Patient has a U-500 Kwikpen on the floor.   **Note U-500 insulin is 5x as concentrated as regular U-100 insulin so patient is getting the equivalent of 50 units/75 units/75 units of U-100 at home based on his report to me today    Benetta Spar, PharmD, BCPS, [place] Clinical Pharmacist  Please check AMION for all [place] phone numbers After 10:00 PM, call [place]

## 2019-09-15 NOTE — Progress Notes (Signed)
Pt had glucose elevated to 577. Pt recently on prednisone.  Pt also requesting cholesterol medication.  Pt placed on sliding scale. Glucose reported to be 346 this am.

## 2019-09-15 NOTE — Progress Notes (Signed)
Inpatient Diabetes Program Recommendations  AACE/ADA: New Consensus Statement on Inpatient Glycemic Control (2015)  Target Ranges:  Prepandial:   less than 140 mg/dL      Peak postprandial:   less than 180 mg/dL (1-2 hours)      Critically ill patients:  140 - 180 mg/dL   Lab Results  Component Value Date   GLUCAP 480 (H) 09/15/2019   HGBA1C 9.3 (H) 09/01/2019    Review of Glycemic Control Results for ZAYSHAWN, RANKIN (MRN [id number]) as of 09/15/2019 13:08  Ref. Range 09/15/2019 01:02 09/15/2019 06:18 09/15/2019 09:56 09/15/2019 11:57  Glucose-Capillary Latest Ref Range: 70 - 99 mg/dL 322 (H) 346 (H) 456 (H) 480 (H)   Diabetes history: DM 2 Outpatient Diabetes medications:  U500 25 units with breakfast, U500 15 units with lunch and U500 25 units with supper (per D/C summary on 09/05/19) Current orders for Inpatient glycemic control:  U500 20 units with breakfast, U500 15 units with lunch and U500 15 units with supper Novolog resistant tid with meals Inpatient Diabetes Program Recommendations:   Note that U500 restarted today.  Agree with orders.  Steroids stopped.  May need to increase PM dose of U500 to 25 units with supper.  Will follow.   Thanks  Adah Perl, RN, BC-ADM Inpatient Diabetes Coordinator Pager [phone] (8a-5p)

## 2019-09-15 NOTE — Progress Notes (Addendum)
CRITICAL VALUE ALERT  Critical Value:  BG 456  Date & Time Notied:  09/15/2019 1009  Provider Notified: Dr. Verlon Au  Lab draw: BG 501, per lab 1152  Orders Received/Actions taken: Adelene Idler as ordered now 20 units,  and recheck BG in an hour. Correct then with novolog sliding scale as ordered.

## 2019-09-15 NOTE — Progress Notes (Signed)
Initial Nutrition Assessment  DOCUMENTATION CODES:   Obesity unspecified  INTERVENTION:    Glucerna Shake po TID, each supplement provides 220 kcal and 10 grams of protein  MVI daily   NUTRITION DIAGNOSIS:   Increased nutrient needs related to acute illness as evidenced by estimated needs.  GOAL:   Patient will meet greater than or equal to 90% of their needs  MONITOR:   PO intake, Supplement acceptance, Weight trends, Labs, I & O's  REASON FOR ASSESSMENT:   Consult Assessment of nutrition requirement/status  ASSESSMENT:   Patient with PMH significant for severe COPD, CKD III, CAD s/p cath 2 weeks ago, thrombotic cerebral vascular accident, HTN, and depression. Presents this admission with acute exacerbation of COPD.   RD working remotely.  Pt endorses having a loss in appetite for "years" PTA. States he typically eats one meal daily that consist of cheeseburgers and fries from a fast food place. Denies appetite changes in days leading up to admission. Likes to drink supplements but cannot afford them. Appetite increasing this admission. Meal completions charted as 100% for his last meal.   Pt endorses a UBW of 270 lb and a recent weight gain of 20 lb due to change in insulin. Records indicate pt's weight has remained relatively stable over the last year. Suspect some weight gain is result of fluid accumulation.   I/O: -6,850 ml since admit  UOP: 5,550 ml x 24 hrs   Medications: folic acid, 60 mg lasix daily, SS novolog, humulin R Labs: CBG 346-543  Diet Order:   Diet Order            Diet heart healthy/carb modified Room service appropriate? Yes; Fluid consistency: Thin  Diet effective now              EDUCATION NEEDS:   Education needs have been addressed  Skin:  Skin Assessment: Reviewed RN Assessment  Last BM:  11/20  Height:   Ht Readings from Last 1 Encounters:  09/13/19 6\' 1"  (1.854 m)    Weight:   Wt Readings from Last 1 Encounters:   09/15/19 135.7 kg    Ideal Body Weight:  83.6 kg  BMI:  Body mass index is 39.47 kg/m.  Estimated Nutritional Needs:   Kcal:  2300-2500 kcal  Protein:  115-130 grams  Fluid:  >/= 2.3 L/day   Mariana Single RD, LDN Clinical Nutrition Pager # - [phone]

## 2019-09-16 ENCOUNTER — Inpatient Hospital Stay (HOSPITAL_COMMUNITY): Payer: Medicare HMO

## 2019-09-16 LAB — CBC WITH DIFFERENTIAL/PLATELET
Abs Immature Granulocytes: 0.07 10*3/uL (ref 0.00–0.07)
Basophils Absolute: 0 10*3/uL (ref 0.0–0.1)
Basophils Relative: 0 %
Eosinophils Absolute: 0 10*3/uL (ref 0.0–0.5)
Eosinophils Relative: 0 %
HCT: 39 % (ref 39.0–52.0)
Hemoglobin: 12.7 g/dL — ABNORMAL LOW (ref 13.0–17.0)
Immature Granulocytes: 1 %
Lymphocytes Relative: 20 %
Lymphs Abs: 1.4 10*3/uL (ref 0.7–4.0)
MCH: 29.7 pg (ref 26.0–34.0)
MCHC: 32.6 g/dL (ref 30.0–36.0)
MCV: 91.3 fL (ref 80.0–100.0)
Monocytes Absolute: 0.7 10*3/uL (ref 0.1–1.0)
Monocytes Relative: 10 %
Neutro Abs: 4.9 10*3/uL (ref 1.7–7.7)
Neutrophils Relative %: 69 %
Platelets: 170 10*3/uL (ref 150–400)
RBC: 4.27 MIL/uL (ref 4.22–5.81)
RDW: 14.9 % (ref 11.5–15.5)
WBC: 7.1 10*3/uL (ref 4.0–10.5)
nRBC: 0 % (ref 0.0–0.2)

## 2019-09-16 LAB — BASIC METABOLIC PANEL
Anion gap: 11 (ref 5–15)
BUN: 44 mg/dL — ABNORMAL HIGH (ref 8–23)
CO2: 26 mmol/L (ref 22–32)
Calcium: 9.2 mg/dL (ref 8.9–10.3)
Chloride: 101 mmol/L (ref 98–111)
Creatinine, Ser: 1.45 mg/dL — ABNORMAL HIGH (ref 0.61–1.24)
GFR calc Af Amer: 57 mL/min — ABNORMAL LOW (ref >60)
GFR calc non Af Amer: 49 mL/min — ABNORMAL LOW (ref >60)
Glucose, Bld: 264 mg/dL — ABNORMAL HIGH (ref 70–99)
Potassium: 4.2 mmol/L (ref 3.5–5.1)
Sodium: 138 mmol/L (ref 135–145)

## 2019-09-16 LAB — GLUCOSE, CAPILLARY
Glucose-Capillary: 278 mg/dL — ABNORMAL HIGH (ref 70–99)
Glucose-Capillary: 357 mg/dL — ABNORMAL HIGH (ref 70–99)
Glucose-Capillary: 259 mg/dL — ABNORMAL HIGH (ref 70–99)
Glucose-Capillary: 258 mg/dL — ABNORMAL HIGH (ref 70–99)

## 2019-09-16 MED ORDER — ENOXAPARIN SODIUM 80 MG/0.8ML ~~LOC~~ SOLN
65.0000 mg | SUBCUTANEOUS | Status: DC
  Administered 2019-09-16: 65 mg via SUBCUTANEOUS
  Filled 2019-09-16: qty 0.8

## 2019-09-16 NOTE — Progress Notes (Signed)
Physical Therapy Treatment Patient Details Name: Reginald Duncan MRN: [id number] DOB: 1951/01/09 Today's Date: 09/16/2019    History of Present Illness Pt is a 68 y.o. male with PMH of severe COPD on occasional home oxygen, CKD III, CAD s/p cath 2 weeks ago, thrombotic cerebral vascular accident, HTN and mild depression, who presents 09/13/19 with c/o increased WOB. Worked up for COPD exacerbation and volume overload.   PT Comments    Pt progressing with mobility. Able to transfer and ambulate with RW at supervision-level. Pt declining SNF-level therapies, plans to return home with continued [facility] services. No DME needs. Encouraged use of RW instead of cane for added stability. Pt ready to d/c and frustrated by recent news of friend passing; declined wanting to talk to anyone about this. If to remain admitted, will continue to follow acutely.   Follow Up Recommendations  Home health PT;Supervision - Intermittent(declined SNF)     Equipment Recommendations  None recommended by PT    Recommendations for Other Services       Precautions / Restrictions Precautions Precautions: Fall Restrictions Weight Bearing Restrictions: No    Mobility  Bed Mobility Overal bed mobility: Modified Independent Bed Mobility: Supine to Sit              Transfers Overall transfer level: Modified independent Equipment used: None;Rolling walker (2 wheeled) Transfers: Sit to/from Stand              Ambulation/Gait Ambulation/Gait assistance: Supervision Gait Distance (Feet): 120 Feet Assistive device: Rolling walker (2 wheeled) Gait Pattern/deviations: Step-through pattern;Decreased stride length;Trunk flexed Gait velocity: Decreased Gait velocity interpretation: 1.31 - 2.62 ft/sec, indicative of limited community ambulator General Gait Details: Slow, steady gait with RW and supervision for safety. Intermittent cues for safety as pt taking hand off RW to talk/fix mask while still moving  forward. DOE 2-3/4 as pt talking and wearing face mask   Stairs             Wheelchair Mobility    Modified Rankin (Stroke Patients Only)       Balance Overall balance assessment: Needs assistance Sitting-balance support: No upper extremity supported;Feet supported Sitting balance-Leahy Scale: Good       Standing balance-Leahy Scale: Fair Standing balance comment: Can static stand and take steps without UE support                            Cognition Arousal/Alertness: Awake/alert Behavior During Therapy: WFL for tasks assessed/performed Overall Cognitive Status: No family/caregiver present to determine baseline cognitive functioning Area of Impairment: Memory;Safety/judgement;Awareness                     Memory: Decreased short-term memory   Safety/Judgement: Decreased awareness of safety;Decreased awareness of deficits Awareness: Emergent   General Comments: pt reports difficulty with short term memory. Upset regarding loss of friend yesterday, upset with hospital stay and threatening to leave AMA. Endorses feelings of depression but declining wanting to pursue counseling - "I don't believe in therapy"      Exercises      General Comments General comments (skin integrity, edema, etc.): Declining SNF-level therapies, plans to return home with continued [facility] services. Denies DME needs      Pertinent Vitals/Pain Pain Assessment: Faces Faces Pain Scale: Hurts little more Pain Location: Back, knees, shoulders (R>L) Pain Descriptors / Indicators: Aching;Guarding;Grimacing;Moaning Pain Intervention(s): Premedicated before session;Monitored during session    Home Living  Prior Function            PT Goals (current goals can now be found in the care plan section) Progress towards PT goals: Progressing toward goals    Frequency    Min 3X/week      PT Plan Discharge plan needs to be updated     Co-evaluation              AM-PAC PT "6 Clicks" Mobility   Outcome Measure  Help needed turning from your back to your side while in a flat bed without using bedrails?: None Help needed moving from lying on your back to sitting on the side of a flat bed without using bedrails?: None Help needed moving to and from a bed to a chair (including a wheelchair)?: A Little Help needed standing up from a chair using your arms (e.g., wheelchair or bedside chair)?: None Help needed to walk in hospital room?: A Little Help needed climbing 3-5 steps with a railing? : A Little 6 Click Score: 21    End of Session Equipment Utilized During Treatment: Gait belt Activity Tolerance: Patient tolerated treatment well Patient left: in chair;with call bell/phone within reach Nurse Communication: Mobility status PT Visit Diagnosis: Unsteadiness on feet (R26.81);Other abnormalities of gait and mobility (R26.89);History of falling (Z91.81);Difficulty in walking, not elsewhere classified (R26.2);Pain     Time: [id number] PT Time Calculation (min) (ACUTE ONLY): 24 min  Charges:  $Gait Training: 8-22 mins $Self Care/Home Management: [place], PT, DPT Acute Rehabilitation Services  Pager [phone] Office [place] 09/16/2019, 10:41 AM

## 2019-09-16 NOTE — Progress Notes (Signed)
Modified Barium Swallow Progress Note  Patient Details  Name: Reginald Duncan MRN: [id number] Date of Birth: [birth year]  Today's Date: 09/16/2019  Modified Barium Swallow completed.  Full report located under Chart Review in the Imaging Section.  Brief recommendations include the following:  Clinical Impression Pt seen in radiology for MBS to rule out silent aspiration. Pt was seen at bedside for clinical swallow evaluation, which did not reveal overt s/s aspiration. Pt was observed swallowing thin liquid, puree, and solid textures. Oral and pharyngeal swallow are within functional limits. These was one episode of flash penetration on large consecutive boluses of thin liquid, however, penetrate cleared spontaneously and was not aspirated. Trace flash penetration occurred only once, despite multiple presentations of thin liquids. No other penetration, aspiration, or post-swallow residue noted. No further ST intervention recommended at this time. Please reconsult if needs arise.    Swallow Evaluation Recommendations  SLP Diet Recommendations: Regular solids;Thin liquid   Liquid Administration via: Cup;Straw   Medication Administration: Whole meds with liquid   Supervision: Patient able to self feed   Compensations: Slow rate;Small sips/bites   Postural Changes: Seated upright at 90 degrees   Oral Care Recommendations: Oral care BID  Enriqueta Shutter, [place], [place] Pathologist Office: [phone] Pager: [phone]   Shonna Chock 09/16/2019,1:32 PM

## 2019-09-16 NOTE — Progress Notes (Signed)
Hospitalist progress note   Reginald Duncan:[id number] DOB: 1951-04-30 DOA: 09/13/2019  PCP: [facility]   Narrative:  68 year old male CAD (MI 20) status post LAD stent 2014 recent admission 11/8 through 11/13 with abnormal stress test-underwent cardiac cath and medical management was recommended COPD on home oxygen CKD stage IIIa CVA Vertigo OSA/not on CPAP Low back pain status post lumbar epidural injections in the past  Admit to [facility] 09/13/2019 significant wheezing cough and increasing oxygen requirement from 2 to 4 L also has been having increasing lower extremity edema after stopping diuretics about 2 weeks ago secondary to worsening renal function  Assessment & Plan: Undifferentiated respiratory failure-BNP 44 but he is obese-he has responded to IV lasix 80--will place him on 60 IV daily, weight down from 136 to 134 kg, now down 10 L this admission--- nursing aware to encourage him to stick to 1200 cc restriction order changed doubt COPd or PNA--hold steroids-stopped doxycycline on 11/24 Recent cardiac cath medical management recommended contnue amlodipine 2.5, metoprolol 100 xl and monitor trends-cont ASa 81 plavix 75  Very poor glycemic control-likely in setting of Iv steroids--stopping steroids completely--increased U500 with resistant sliding scale U5 120 a.m., 20 p.m., 20 lunchtime Continue Lyrica 75 daily  COPD-see above--in addition continue Albuterol, Anoro Ellipta  CKD stage III-follow with Dr. Lorrene Reid at [facility]-will need close OP follow up Creatinine is stable in the setting of diuresis  Prior CVA?-cont asa and palvix  Depression Continue quetiapine 300 at bedtime, trazodone 25 at bedtime  Vertigo OSA Low back pain   Subjective:  Feels poorly lost his friend a day and a half ago quite depressed asking to speak to chaplain  Consultants:   noen Procedures:   n Antimicrobials:   doxycvycline  Data Reviewed: BUN/creatinine  stable and actually improved from 42/1.5 down from 44/1.5 hemoglobin is 12.7 Sugars in the 2 72-64 range Radiology Studies: Ct chest clear  Objective: Vitals:   09/15/19 1930 09/16/19 0618 09/16/19 0807 09/16/19 0850  BP: 139/84 121/64  126/76  Pulse: 94  (!) 108 (!) 102  Resp:   (!) 26   Temp: 98.2 F (36.8 C) 97.8 F (36.6 C)  97.7 F (36.5 C)  TempSrc: Oral Oral  Oral  SpO2: 96%   91%  Weight:  134.1 kg    Height:        Intake/Output Summary (Last 24 hours) at 09/16/2019 1002 Last data filed at 09/16/2019 0848 Gross per 24 hour  Intake -  Output 4025 ml  Net -4025 ml   Filed Weights   09/14/19 0445 09/15/19 0620 09/16/19 0618  Weight: (!) 136.3 kg 135.7 kg 134.1 kg    Examination: Awake coherent no distress EOMI NCAT Chest clear no added sound S1-S2 no murmur Cannot appreciate JVD given habitus Lower extremities are slightly swollen he has stasis dermatitis  Scheduled Meds: . amLODipine  2.5 mg Oral Daily  . ammonium lactate   Topical BID  . aspirin  81 mg Oral Daily  . clopidogrel  75 mg Oral Daily  . enoxaparin (LOVENOX) injection  70 mg Subcutaneous Q24H  . famotidine  10 mg Oral QHS  . feeding supplement (GLUCERNA SHAKE)  237 mL Oral TID BM  . folic acid  1 mg Oral Daily  . furosemide  60 mg Intravenous Daily  . icosapent Ethyl  2 g Oral BID  . insulin aspart  0-20 Units Subcutaneous TID WC  . insulin regular human CONCENTRATED  20 Units  Subcutaneous Q breakfast  . insulin regular human CONCENTRATED  20 Units Subcutaneous QPC supper  . insulin regular human CONCENTRATED  20 Units Subcutaneous Q lunch  . metoprolol succinate  100 mg Oral Daily  . multivitamin with minerals  1 tablet Oral Daily  . pregabalin  75 mg Oral TID  . QUEtiapine  300 mg Oral QHS  . umeclidinium-vilanterol  1 puff Inhalation Daily   Continuous Infusions:   LOS: 2 days   Time spent: Remy, MD [facility]  09/16/2019, 10:02 AM

## 2019-09-16 NOTE — TOC Progression Note (Signed)
Transition of Care [facility]) - Progression Note    Patient Details  Name: DEKE RENCH MRN: [id number] Date of Birth: 1951/03/11  Transition of Care [facility] CM/SW [place], [place] Phone Number: 09/16/2019, 1:24 PM  Clinical Narrative:     CSW visit with the patient at bedside. CSW introduced self and explained role. CSW discussed PT recommendation of  ST rehab at [facility]. Patient states he lives home alone. Patient states he feels safe enough to return home and declined SNF. Patient states he is active with [facility] (RN, PT, [place]) and [facility].   Pateint express grief for the loss of his best friend and states he wants to be available to the family for emotional support. He states she friend had cancer and although they were expecting to loose him it was still upsetting news.  CSW acknowledged and validated patient's feelings. CSW offered mental health resources but patient declined. Patient he feels better and no resources where needed.  Thurmond Butts, MSW, [facility] Clinical Social Worker [phone]   Expected Discharge Plan: [place] Barriers to Discharge: No Barriers Identified  Expected Discharge Plan and Services Expected Discharge Plan: [place] arrangements for the past 2 months: Single Family Home                                       Social Determinants of Health (SDOH) Interventions    Readmission Risk Interventions No flowsheet data found.

## 2019-09-17 LAB — RENAL FUNCTION PANEL
Albumin: 3.3 g/dL — ABNORMAL LOW (ref 3.5–5.0)
Anion gap: 13 (ref 5–15)
BUN: 42 mg/dL — ABNORMAL HIGH (ref 8–23)
CO2: 26 mmol/L (ref 22–32)
Calcium: 9.2 mg/dL (ref 8.9–10.3)
Chloride: 99 mmol/L (ref 98–111)
Creatinine, Ser: 1.3 mg/dL — ABNORMAL HIGH (ref 0.61–1.24)
GFR calc Af Amer: >60 mL/min (ref >60)
GFR calc non Af Amer: 56 mL/min — ABNORMAL LOW (ref >60)
Glucose, Bld: 324 mg/dL — ABNORMAL HIGH (ref 70–99)
Phosphorus: 4 mg/dL (ref 2.5–4.6)
Potassium: 4.7 mmol/L (ref 3.5–5.1)
Sodium: 138 mmol/L (ref 135–145)

## 2019-09-17 LAB — GLUCOSE, CAPILLARY
Glucose-Capillary: 317 mg/dL — ABNORMAL HIGH (ref 70–99)
Glucose-Capillary: 370 mg/dL — ABNORMAL HIGH (ref 70–99)
Glucose-Capillary: 235 mg/dL — ABNORMAL HIGH (ref 70–99)
Glucose-Capillary: 275 mg/dL — ABNORMAL HIGH (ref 70–99)

## 2019-09-17 MED ORDER — FUROSEMIDE 40 MG PO TABS
40.0000 mg | ORAL_TABLET | Freq: Two times a day (BID) | ORAL | Status: DC
  Administered 2019-09-17 – 2019-09-18 (×2): 40 mg via ORAL
  Filled 2019-09-17 (×2): qty 1

## 2019-09-17 MED ORDER — ALBUTEROL SULFATE (2.5 MG/3ML) 0.083% IN NEBU
2.5000 mg | INHALATION_SOLUTION | Freq: Four times a day (QID) | RESPIRATORY_TRACT | Status: DC
  Administered 2019-09-17: 2.5 mg via RESPIRATORY_TRACT
  Filled 2019-09-17: qty 3

## 2019-09-17 MED ORDER — ALBUTEROL SULFATE (2.5 MG/3ML) 0.083% IN NEBU
2.5000 mg | INHALATION_SOLUTION | Freq: Three times a day (TID) | RESPIRATORY_TRACT | Status: DC
  Administered 2019-09-18: 08:00:00 2.5 mg via RESPIRATORY_TRACT
  Filled 2019-09-17: qty 3

## 2019-09-17 MED ORDER — ENOXAPARIN SODIUM 60 MG/0.6ML ~~LOC~~ SOLN
60.0000 mg | SUBCUTANEOUS | Status: DC
  Administered 2019-09-17: 60 mg via SUBCUTANEOUS
  Filled 2019-09-17 (×2): qty 0.6

## 2019-09-17 MED ORDER — INSULIN REGULAR HUMAN (CONC) 500 UNIT/ML ~~LOC~~ SOPN
25.0000 [IU] | PEN_INJECTOR | Freq: Every day | SUBCUTANEOUS | Status: DC
  Administered 2019-09-17: 25 [IU] via SUBCUTANEOUS

## 2019-09-17 MED ORDER — ALBUTEROL SULFATE (2.5 MG/3ML) 0.083% IN NEBU
2.5000 mg | INHALATION_SOLUTION | RESPIRATORY_TRACT | Status: DC
  Administered 2019-09-17: 2.5 mg via RESPIRATORY_TRACT
  Filled 2019-09-17 (×2): qty 3

## 2019-09-17 MED ORDER — INSULIN REGULAR HUMAN (CONC) 500 UNIT/ML ~~LOC~~ SOPN
25.0000 [IU] | PEN_INJECTOR | Freq: Every day | SUBCUTANEOUS | Status: DC
  Administered 2019-09-18: 11:00:00 25 [IU] via SUBCUTANEOUS

## 2019-09-17 MED ORDER — INSULIN REGULAR HUMAN (CONC) 500 UNIT/ML ~~LOC~~ SOPN
25.0000 [IU] | PEN_INJECTOR | Freq: Every day | SUBCUTANEOUS | Status: DC
  Administered 2019-09-17 – 2019-09-18 (×2): 25 [IU] via SUBCUTANEOUS
  Filled 2019-09-17: qty 3

## 2019-09-17 NOTE — Progress Notes (Signed)
Chaplain responded to consult for spiritual care. Reginald Duncan was sound asleep when Chaplain arrived. Chaplain will try again later today. Chaplain remains available for support as needs arise.   Chaplain Resident, Evelene Croon, M Div

## 2019-09-17 NOTE — Care Management Important Message (Signed)
Important Message  Patient Details  Name: Reginald Duncan MRN: [id number] Date of Birth: 04-30-1951   Medicare Important Message Given:  Yes     Shelda Altes 09/17/2019, 1:29 PM

## 2019-09-17 NOTE — Progress Notes (Signed)
Hospitalist progress note   Reginald Duncan [id number] DOB: 03/01/1951 DOA: 09/13/2019  PCP: [facility]   Narrative:  68 year old male CAD (MI 15) status post LAD stent 2014 recent admission 11/8 through 11/13 with abnormal stress test-underwent cardiac cath and medical management was recommended COPD on home oxygen CKD stage IIIa CVA Vertigo OSA/not on CPAP Low back pain status post lumbar epidural injections in the past  Admit to [facility] 09/13/2019 significant wheezing cough and increasing oxygen requirement from 2 to 4 L also has been having increasing lower extremity edema after stopping diuretics about 2 weeks ago secondary to worsening renal function  Assessment & Plan: Acute diastolic heart failure-BNP 44 but he is obese-he has responded to IV lasix 80--will place him on 60 IV daily, weight down from 136 to 134 kg, now down -15 liters this admission--- nursing aware to encourage him to stick to 1200 cc restriction order changed Likely can transition to p.o. diuretics in a.m.  Recent cardiac cath  HTN  medical management recommended continue amlodipine 2.5, metoprolol 100 xl and monitor trends-cont ASa 81 plavix 75 blood pressures are stable  Very poor glycemic control-likely in setting of Iv steroids--stopping steroids completely 11/23, continue U500 and increase dose to 25 3 times daily sugar levels are higher than yesterday  COPD-see above--in addition continue Albuterol, Anoro Ellipta  CKD stage III-follow with Dr. Lorrene Reid at [facility]-will need close OP follow up Creatinine is stable in the setting of diuresis  Prior CVA?-cont asa and palvix  Depression Continue quetiapine 300 at bedtime, trazodone 25 at bedtime  Vertigo OSA Low back pain   Subjective:  Awake coherent no distress however a little bit wheezy and some shortness of breath No chest pain No fever Eating okay   Consultants:   noen Procedures:   n Antimicrobials:    doxycvycline  Data Reviewed: BUN/creatinine 41/1.9-42/1.3  hemoglobin is 12.7 Sugars in the 302 350 range Radiology Studies: Ct chest clear  Objective: Vitals:   09/16/19 2039 09/17/19 0607 09/17/19 0721 09/17/19 0900  BP: (!) 161/87 131/75  139/89  Pulse: 100 89 98 98  Resp: 14 16  15   Temp: 98 F (36.7 C) 97.7 F (36.5 C)  98.2 F (36.8 C)  TempSrc: Oral Oral  Oral  SpO2: 93% 94%  96%  Weight:  133.9 kg    Height:        Intake/Output Summary (Last 24 hours) at 09/17/2019 [id number] Last data filed at 09/17/2019 0900 Gross per 24 hour  Intake 322 ml  Output 4150 ml  Net -3828 ml   Filed Weights   09/15/19 0620 09/16/19 0618 09/17/19 0607  Weight: 135.7 kg 134.1 kg 133.9 kg    Examination: Awake coherent no distress EOMI NCAT Neck soft supple thick Mallampati 4 Some wheeze bilaterally S1-S2 no murmur rub or gallop Abdomen soft No lower extremity edema Neurologically intact   Scheduled Meds: . amLODipine  2.5 mg Oral Daily  . ammonium lactate   Topical BID  . aspirin  81 mg Oral Daily  . clopidogrel  75 mg Oral Daily  . enoxaparin (LOVENOX) injection  65 mg Subcutaneous Q24H  . famotidine  10 mg Oral QHS  . feeding supplement (GLUCERNA SHAKE)  237 mL Oral TID BM  . folic acid  1 mg Oral Daily  . furosemide  60 mg Intravenous Daily  . icosapent Ethyl  2 g Oral BID  . insulin aspart  0-20 Units Subcutaneous TID WC  .  insulin regular human CONCENTRATED  20 Units Subcutaneous Q breakfast  . insulin regular human CONCENTRATED  20 Units Subcutaneous QPC supper  . insulin regular human CONCENTRATED  20 Units Subcutaneous Q lunch  . metoprolol succinate  100 mg Oral Daily  . multivitamin with minerals  1 tablet Oral Daily  . pregabalin  75 mg Oral TID  . QUEtiapine  300 mg Oral QHS  . umeclidinium-vilanterol  1 puff Inhalation Daily   Continuous Infusions:   LOS: 3 days   Time spent: [place], MD [facility]  09/17/2019, 9:38 AM
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
## 2019-09-17 NOTE — Progress Notes (Signed)
Physical Therapy Treatment Patient Details Name: Reginald Duncan MRN: [id number] DOB: Sep 02, 1951 Today's Date: 09/17/2019    History of Present Illness Pt is a 68 y.o. male with PMH of severe COPD on occasional home oxygen, CKD III, CAD s/p cath 2 weeks ago, thrombotic cerebral vascular accident, HTN and mild depression, who presents 09/13/19 with c/o increased WOB. Worked up for COPD exacerbation and volume overload.    PT Comments    Pt in bed upon PT arrival, agreeable to PT session. Pt reports sig increase in BLE and low back pain today that requires sig extra time with mobility, transfers, and added support to stand. Pt was also limited in ambulation distance compared to prior therapy sessions, but demos slightly improved safety awareness with management of RW. Pt was educated in strengthening exercises for BLE in a pain-free ROM, but continued to perform exercises in such a way that exacerbated his pain and will benefit from further education. Pt will continue to benefit from skilled PT to address functional mobility and safety with mobility.    Follow Up Recommendations  Home health PT;Supervision - Intermittent(declines SNF)     Equipment Recommendations  None recommended by PT    Recommendations for Other Services       Precautions / Restrictions Precautions Precautions: Fall Precaution Comments: knees giving way, painful with ambulation Restrictions Weight Bearing Restrictions: No    Mobility  Bed Mobility Overal bed mobility: Modified Independent Bed Mobility: Supine to Sit     Supine to sit: Modified independent (Device/Increase time) Sit to supine: Supervision      Transfers Overall transfer level: Modified independent Equipment used: Rolling walker (2 wheeled) Transfers: Sit to/from Stand Sit to Stand: Min guard         General transfer comment: VCs for safety and hand placement  Ambulation/Gait Ambulation/Gait assistance: Supervision Gait Distance  (Feet): 100 Feet Assistive device: Rolling walker (2 wheeled) Gait Pattern/deviations: Step-through pattern;Decreased stride length;Trunk flexed Gait velocity: Decreased Gait velocity interpretation: 1.31 - 2.62 ft/sec, indicative of limited community ambulator General Gait Details: Slow, steady gait with RW and supervision for safety. Intermittent cues for safety as pt taking hand off RW to talk/fix mask while still moving forward. pt reports pain in knees 9/10 with mobility   Stairs             Wheelchair Mobility    Modified Rankin (Stroke Patients Only)       Balance Overall balance assessment: Needs assistance Sitting-balance support: No upper extremity supported;Feet supported Sitting balance-Leahy Scale: Good Sitting balance - Comments: modI   Standing balance support: Bilateral upper extremity supported Standing balance-Leahy Scale: Fair Standing balance comment: can stand without UE support, but due to pain today, reliant on BUE support to off-weight                            Cognition Arousal/Alertness: Awake/alert Behavior During Therapy: WFL for tasks assessed/performed Overall Cognitive Status: No family/caregiver present to determine baseline cognitive functioning Area of Impairment: Memory;Safety/judgement;Awareness                     Memory: Decreased short-term memory   Safety/Judgement: Decreased awareness of safety;Decreased awareness of deficits Awareness: Emergent Problem Solving: Slow processing;Requires verbal cues General Comments: pt reports difficulty with short term memory. Upset regarding loss of friend yesterday, upset with hospital stay and threatening to leave AMA. Endorses feelings of depression but declining wanting to pursue counseling - "  I don't believe in therapy"      Exercises General Exercises - Lower Extremity Long Arc Quad: AROM;Both;10 reps;Seated Heel Raises: AROM;Both;15 reps;Seated Other  Exercises Other Exercises: seated HS stretch, 2 x 15 sec bilaterally    General Comments General comments (skin integrity, edema, etc.): Declining SNF-level therapies, plans to return home with continued [facility] services. Denies DME needs      Pertinent Vitals/Pain Pain Assessment: 0-10 Pain Score: 9  Pain Location: back, knees Pain Descriptors / Indicators: Aching;Grimacing;Guarding;Sore Pain Intervention(s): Limited activity within patient's tolerance;Monitored during session;Patient requesting pain meds-RN notified;Repositioned    Home Living                      Prior Function            PT Goals (current goals can now be found in the care plan section) Acute Rehab PT Goals Patient Stated Goal: to get better PT Goal Formulation: With patient Time For Goal Achievement: 09/28/19 Potential to Achieve Goals: Fair Additional Goals Additional Goal #1: Pt will demonstrate the ability to maintain dynamic standing balance within 6 inches of his base of support with unilateral UE support of an assistive device. Progress towards PT goals: Progressing toward goals    Frequency    Min 3X/week      PT Plan Current plan remains appropriate    Co-evaluation              AM-PAC PT "6 Clicks" Mobility   Outcome Measure  Help needed turning from your back to your side while in a flat bed without using bedrails?: None Help needed moving from lying on your back to sitting on the side of a flat bed without using bedrails?: None Help needed moving to and from a bed to a chair (including a wheelchair)?: A Little Help needed standing up from a chair using your arms (e.g., wheelchair or bedside chair)?: None Help needed to walk in hospital room?: A Little Help needed climbing 3-5 steps with a railing? : A Little 6 Click Score: 21    End of Session Equipment Utilized During Treatment: Gait belt Activity Tolerance: Patient tolerated treatment well;Patient limited by  pain Patient left: with call bell/phone within reach;in bed;with bed alarm set Nurse Communication: Mobility status;Patient requests pain meds PT Visit Diagnosis: Unsteadiness on feet (R26.81);Other abnormalities of gait and mobility (R26.89);History of falling (Z91.81);Difficulty in walking, not elsewhere classified (R26.2);Pain Pain - Right/Left: (back) Pain - part of body: (back)     Time: [id number] PT Time Calculation (min) (ACUTE ONLY): 34 min  Charges:  $Gait Training: 8-22 mins $Therapeutic Exercise: 8-22 mins                    Mickey Farber, PT, DPT   Acute Rehabilitation Department [phone]   Otho Bellows 09/17/2019, 4:11 PM

## 2019-09-17 NOTE — Progress Notes (Signed)
Inpatient Diabetes Program Recommendations  AACE/ADA: New Consensus Statement on Inpatient Glycemic Control (2015)  Target Ranges:  Prepandial:   less than 140 mg/dL      Peak postprandial:   less than 180 mg/dL (1-2 hours)      Critically ill patients:  140 - 180 mg/dL   Lab Results  Component Value Date   GLUCAP 317 (H) 09/17/2019   HGBA1C 9.3 (H) 09/01/2019    Review of Glycemic Control: Results for [name], [name] (MRN [id number]) as of 09/17/2019 09:40  Ref. Range 09/16/2019 16:16 09/16/2019 20:43 09/17/2019 06:06  Glucose-Capillary Latest Ref Range: 70 - 99 mg/dL 259 (H) 258 (H) 317 (H)   Diabetes history: DM 2 Outpatient Diabetes medications:  U500 25 units with breakfast, U500 15 units with lunch and U500 25 units with supper (per D/C summary on 09/05/19) Current orders for Inpatient glycemic control:  U500 20 units with breakfast, U500 20 units with lunch and U500 20 units with supper Novolog resistant tid with meals Inpatient Diabetes Program Recommendations:    Consider further increase of U500 to 25 units with breakfast, lunch and supper.    Thanks,  Reginald Perl, RN, BC-ADM Inpatient Diabetes Coordinator Pager [phone] (8a-5p)

## 2019-09-18 LAB — CBC WITH DIFFERENTIAL/PLATELET
Abs Immature Granulocytes: 0.07 10*3/uL (ref 0.00–0.07)
Basophils Absolute: 0 10*3/uL (ref 0.0–0.1)
Basophils Relative: 0 %
Eosinophils Absolute: 0.3 10*3/uL (ref 0.0–0.5)
Eosinophils Relative: 5 %
HCT: 40.1 % (ref 39.0–52.0)
Hemoglobin: 13.7 g/dL (ref 13.0–17.0)
Immature Granulocytes: 1 %
Lymphocytes Relative: 30 %
Lymphs Abs: 2 10*3/uL (ref 0.7–4.0)
MCH: 30.2 pg (ref 26.0–34.0)
MCHC: 34.2 g/dL (ref 30.0–36.0)
MCV: 88.5 fL (ref 80.0–100.0)
Monocytes Absolute: 0.5 10*3/uL (ref 0.1–1.0)
Monocytes Relative: 8 %
Neutro Abs: 3.7 10*3/uL (ref 1.7–7.7)
Neutrophils Relative %: 56 %
Platelets: 203 10*3/uL (ref 150–400)
RBC: 4.53 MIL/uL (ref 4.22–5.81)
RDW: 14.7 % (ref 11.5–15.5)
WBC: 6.6 10*3/uL (ref 4.0–10.5)
nRBC: 0 % (ref 0.0–0.2)

## 2019-09-18 LAB — CULTURE, BLOOD (ROUTINE X 2)
Culture: NO GROWTH
Culture: NO GROWTH
Special Requests: ADEQUATE

## 2019-09-18 LAB — BASIC METABOLIC PANEL
Anion gap: 11 (ref 5–15)
BUN: 42 mg/dL — ABNORMAL HIGH (ref 8–23)
CO2: 29 mmol/L (ref 22–32)
Calcium: 9.8 mg/dL (ref 8.9–10.3)
Chloride: 95 mmol/L — ABNORMAL LOW (ref 98–111)
Creatinine, Ser: 1.42 mg/dL — ABNORMAL HIGH (ref 0.61–1.24)
GFR calc Af Amer: 59 mL/min — ABNORMAL LOW (ref >60)
GFR calc non Af Amer: 51 mL/min — ABNORMAL LOW (ref >60)
Glucose, Bld: 377 mg/dL — ABNORMAL HIGH (ref 70–99)
Potassium: 4.5 mmol/L (ref 3.5–5.1)
Sodium: 135 mmol/L (ref 135–145)

## 2019-09-18 LAB — GLUCOSE, CAPILLARY
Glucose-Capillary: 391 mg/dL — ABNORMAL HIGH (ref 70–99)
Glucose-Capillary: 334 mg/dL — ABNORMAL HIGH (ref 70–99)

## 2019-09-18 MED ORDER — ALBUTEROL SULFATE (2.5 MG/3ML) 0.083% IN NEBU: 2.5000 mg | INHALATION_SOLUTION | Freq: Two times a day (BID) | RESPIRATORY_TRACT | Status: DC

## 2019-09-18 MED ORDER — ALBUTEROL SULFATE HFA 108 (90 BASE) MCG/ACT IN AERS: 2.0000 | INHALATION_SPRAY | Freq: Four times a day (QID) | RESPIRATORY_TRACT | 1 refills | Status: AC | PRN

## 2019-09-18 MED ORDER — FUROSEMIDE 40 MG PO TABS: 40.0000 mg | ORAL_TABLET | Freq: Two times a day (BID) | ORAL | 0 refills | Status: AC

## 2019-09-18 MED ORDER — UMECLIDINIUM-VILANTEROL 62.5-25 MCG/INH IN AEPB: 1.0000 | INHALATION_SPRAY | Freq: Every day | RESPIRATORY_TRACT | 0 refills | Status: AC

## 2019-09-18 NOTE — Progress Notes (Signed)
Pt discharged today to home.  Pt's IV was removed, Pt taken off telemetry, CCMD notified.  Pt left with all of their personal belongings.  AVS documentation reviewed with Pt and all questions answered.
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
## 2019-09-22 ENCOUNTER — Other Ambulatory Visit: Payer: Self-pay | Admitting: *Deleted

## 2019-09-24 ENCOUNTER — Other Ambulatory Visit: Payer: Self-pay

## 2019-09-24 ENCOUNTER — Inpatient Hospital Stay (HOSPITAL_COMMUNITY)
Admission: EM | Admit: 2019-09-24 | Discharge: 2019-09-27 | DRG: 291 | Disposition: A | Discharge status: Home / Self Care | Payer: Medicare HMO | Attending: Family Medicine | Admitting: Family Medicine

## 2019-09-24 ENCOUNTER — Encounter (HOSPITAL_COMMUNITY): Payer: Self-pay

## 2019-09-24 ENCOUNTER — Emergency Department (HOSPITAL_COMMUNITY): Payer: Medicare HMO

## 2019-09-24 DIAGNOSIS — K219 Gastro-esophageal reflux disease without esophagitis: Secondary | ICD-10-CM | POA: Diagnosis present

## 2019-09-24 DIAGNOSIS — R42 Dizziness and giddiness: Secondary | ICD-10-CM | POA: Diagnosis present

## 2019-09-24 DIAGNOSIS — J441 Chronic obstructive pulmonary disease with (acute) exacerbation: Secondary | ICD-10-CM | POA: Diagnosis present

## 2019-09-24 DIAGNOSIS — G8929 Other chronic pain: Secondary | ICD-10-CM | POA: Diagnosis present

## 2019-09-24 DIAGNOSIS — I5033 Acute on chronic diastolic (congestive) heart failure: Secondary | ICD-10-CM | POA: Diagnosis not present

## 2019-09-24 DIAGNOSIS — E1151 Type 2 diabetes mellitus with diabetic peripheral angiopathy without gangrene: Secondary | ICD-10-CM | POA: Diagnosis present

## 2019-09-24 DIAGNOSIS — F0390 Unspecified dementia without behavioral disturbance: Secondary | ICD-10-CM | POA: Diagnosis present

## 2019-09-24 DIAGNOSIS — Z955 Presence of coronary angioplasty implant and graft: Secondary | ICD-10-CM

## 2019-09-24 DIAGNOSIS — E1122 Type 2 diabetes mellitus with diabetic chronic kidney disease: Secondary | ICD-10-CM | POA: Diagnosis present

## 2019-09-24 DIAGNOSIS — I69334 Monoplegia of upper limb following cerebral infarction affecting left non-dominant side: Secondary | ICD-10-CM | POA: Diagnosis not present

## 2019-09-24 DIAGNOSIS — Z823 Family history of stroke: Secondary | ICD-10-CM

## 2019-09-24 DIAGNOSIS — T501X6A Underdosing of loop [high-ceiling] diuretics, initial encounter: Secondary | ICD-10-CM | POA: Diagnosis present

## 2019-09-24 DIAGNOSIS — Z79899 Other long term (current) drug therapy: Secondary | ICD-10-CM | POA: Diagnosis not present

## 2019-09-24 DIAGNOSIS — J9620 Acute and chronic respiratory failure, unspecified whether with hypoxia or hypercapnia: Secondary | ICD-10-CM | POA: Diagnosis present

## 2019-09-24 DIAGNOSIS — Z7951 Long term (current) use of inhaled steroids: Secondary | ICD-10-CM

## 2019-09-24 DIAGNOSIS — R0602 Shortness of breath: Secondary | ICD-10-CM | POA: Diagnosis not present

## 2019-09-24 DIAGNOSIS — E1165 Type 2 diabetes mellitus with hyperglycemia: Secondary | ICD-10-CM | POA: Diagnosis present

## 2019-09-24 DIAGNOSIS — Z7902 Long term (current) use of antithrombotics/antiplatelets: Secondary | ICD-10-CM

## 2019-09-24 DIAGNOSIS — E119 Type 2 diabetes mellitus without complications: Secondary | ICD-10-CM

## 2019-09-24 DIAGNOSIS — Z91128 Patient's intentional underdosing of medication regimen for other reason: Secondary | ICD-10-CM

## 2019-09-24 DIAGNOSIS — E1140 Type 2 diabetes mellitus with diabetic neuropathy, unspecified: Secondary | ICD-10-CM | POA: Diagnosis present

## 2019-09-24 DIAGNOSIS — I2510 Atherosclerotic heart disease of native coronary artery without angina pectoris: Secondary | ICD-10-CM | POA: Diagnosis present

## 2019-09-24 DIAGNOSIS — I252 Old myocardial infarction: Secondary | ICD-10-CM | POA: Diagnosis not present

## 2019-09-24 DIAGNOSIS — Z20828 Contact with and (suspected) exposure to other viral communicable diseases: Secondary | ICD-10-CM | POA: Diagnosis present

## 2019-09-24 DIAGNOSIS — I69398 Other sequelae of cerebral infarction: Secondary | ICD-10-CM

## 2019-09-24 DIAGNOSIS — N1831 Chronic kidney disease, stage 3a: Secondary | ICD-10-CM | POA: Diagnosis present

## 2019-09-24 DIAGNOSIS — Z8051 Family history of malignant neoplasm of kidney: Secondary | ICD-10-CM

## 2019-09-24 DIAGNOSIS — I130 Hypertensive heart and chronic kidney disease with heart failure and stage 1 through stage 4 chronic kidney disease, or unspecified chronic kidney disease: Secondary | ICD-10-CM | POA: Diagnosis not present

## 2019-09-24 DIAGNOSIS — G4733 Obstructive sleep apnea (adult) (pediatric): Secondary | ICD-10-CM | POA: Diagnosis present

## 2019-09-24 DIAGNOSIS — I119 Hypertensive heart disease without heart failure: Secondary | ICD-10-CM | POA: Diagnosis present

## 2019-09-24 DIAGNOSIS — I10 Essential (primary) hypertension: Secondary | ICD-10-CM | POA: Diagnosis present

## 2019-09-24 DIAGNOSIS — N183 Chronic kidney disease, stage 3 unspecified: Secondary | ICD-10-CM | POA: Diagnosis present

## 2019-09-24 DIAGNOSIS — M545 Low back pain: Secondary | ICD-10-CM | POA: Diagnosis present

## 2019-09-24 DIAGNOSIS — F329 Major depressive disorder, single episode, unspecified: Secondary | ICD-10-CM | POA: Diagnosis present

## 2019-09-24 DIAGNOSIS — R0789 Other chest pain: Secondary | ICD-10-CM | POA: Diagnosis not present

## 2019-09-24 DIAGNOSIS — Z806 Family history of leukemia: Secondary | ICD-10-CM

## 2019-09-24 DIAGNOSIS — J80 Acute respiratory distress syndrome: Secondary | ICD-10-CM | POA: Diagnosis not present

## 2019-09-24 DIAGNOSIS — E785 Hyperlipidemia, unspecified: Secondary | ICD-10-CM | POA: Diagnosis present

## 2019-09-24 DIAGNOSIS — Z9641 Presence of insulin pump (external) (internal): Secondary | ICD-10-CM | POA: Diagnosis present

## 2019-09-24 DIAGNOSIS — R000 Tachycardia, unspecified: Secondary | ICD-10-CM | POA: Diagnosis not present

## 2019-09-24 DIAGNOSIS — Z8701 Personal history of pneumonia (recurrent): Secondary | ICD-10-CM

## 2019-09-24 DIAGNOSIS — R079 Chest pain, unspecified: Secondary | ICD-10-CM | POA: Diagnosis not present

## 2019-09-24 DIAGNOSIS — E7800 Pure hypercholesterolemia, unspecified: Secondary | ICD-10-CM | POA: Diagnosis present

## 2019-09-24 DIAGNOSIS — Z7982 Long term (current) use of aspirin: Secondary | ICD-10-CM | POA: Diagnosis not present

## 2019-09-24 DIAGNOSIS — Z8249 Family history of ischemic heart disease and other diseases of the circulatory system: Secondary | ICD-10-CM

## 2019-09-24 LAB — I-STAT CHEM 8, ED
BUN: 30 mg/dL — ABNORMAL HIGH (ref 8–23)
Calcium, Ion: 1.12 mmol/L — ABNORMAL LOW (ref 1.15–1.40)
Chloride: 107 mmol/L (ref 98–111)
Creatinine, Ser: 1.5 mg/dL — ABNORMAL HIGH (ref 0.61–1.24)
Glucose, Bld: 193 mg/dL — ABNORMAL HIGH (ref 70–99)
HCT: 40 % (ref 39.0–52.0)
Hemoglobin: 13.6 g/dL (ref 13.0–17.0)
Potassium: 3.8 mmol/L (ref 3.5–5.1)
Sodium: 143 mmol/L (ref 135–145)
TCO2: 29 mmol/L (ref 22–32)

## 2019-09-24 LAB — CBC WITH DIFFERENTIAL/PLATELET
Abs Immature Granulocytes: 0.07 10*3/uL (ref 0.00–0.07)
Basophils Absolute: 0 10*3/uL (ref 0.0–0.1)
Basophils Relative: 1 %
Eosinophils Absolute: 0.6 10*3/uL — ABNORMAL HIGH (ref 0.0–0.5)
Eosinophils Relative: 8 %
HCT: 40.3 % (ref 39.0–52.0)
Hemoglobin: 13.2 g/dL (ref 13.0–17.0)
Immature Granulocytes: 1 %
Lymphocytes Relative: 18 %
Lymphs Abs: 1.3 10*3/uL (ref 0.7–4.0)
MCH: 29.8 pg (ref 26.0–34.0)
MCHC: 32.8 g/dL (ref 30.0–36.0)
MCV: 91 fL (ref 80.0–100.0)
Monocytes Absolute: 0.6 10*3/uL (ref 0.1–1.0)
Monocytes Relative: 9 %
Neutro Abs: 4.8 10*3/uL (ref 1.7–7.7)
Neutrophils Relative %: 63 %
Platelets: 203 10*3/uL (ref 150–400)
RBC: 4.43 MIL/uL (ref 4.22–5.81)
RDW: 15.1 % (ref 11.5–15.5)
WBC: 7.5 10*3/uL (ref 4.0–10.5)
nRBC: 0 % (ref 0.0–0.2)

## 2019-09-24 LAB — POC SARS CORONAVIRUS 2 AG -  ED: SARS Coronavirus 2 Ag: NEGATIVE

## 2019-09-24 LAB — COMPREHENSIVE METABOLIC PANEL
ALT: 45 U/L — ABNORMAL HIGH (ref 0–44)
AST: 31 U/L (ref 15–41)
Albumin: 3.6 g/dL (ref 3.5–5.0)
Alkaline Phosphatase: 76 U/L (ref 38–126)
Anion gap: 8 (ref 5–15)
BUN: 24 mg/dL — ABNORMAL HIGH (ref 8–23)
CO2: 25 mmol/L (ref 22–32)
Calcium: 8.8 mg/dL — ABNORMAL LOW (ref 8.9–10.3)
Chloride: 110 mmol/L (ref 98–111)
Creatinine, Ser: 1.57 mg/dL — ABNORMAL HIGH (ref 0.61–1.24)
GFR calc Af Amer: 52 mL/min — ABNORMAL LOW (ref >60)
GFR calc non Af Amer: 45 mL/min — ABNORMAL LOW (ref >60)
Glucose, Bld: 214 mg/dL — ABNORMAL HIGH (ref 70–99)
Potassium: 3.7 mmol/L (ref 3.5–5.1)
Sodium: 143 mmol/L (ref 135–145)
Total Bilirubin: 0.6 mg/dL (ref 0.3–1.2)
Total Protein: 6.2 g/dL — ABNORMAL LOW (ref 6.5–8.1)

## 2019-09-24 LAB — TROPONIN I (HIGH SENSITIVITY): Troponin I (High Sensitivity): 14 ng/L (ref <18)

## 2019-09-24 LAB — BRAIN NATRIURETIC PEPTIDE: B Natriuretic Peptide: 26.2 pg/mL (ref 0.0–100.0)

## 2019-09-24 MED ORDER — ALBUTEROL SULFATE (2.5 MG/3ML) 0.083% IN NEBU
2.5000 mg | INHALATION_SOLUTION | Freq: Four times a day (QID) | RESPIRATORY_TRACT | Status: DC
  Administered 2019-09-25 (×2): 2.5 mg via RESPIRATORY_TRACT
  Filled 2019-09-24 (×2): qty 3

## 2019-09-24 MED ORDER — SODIUM CHLORIDE 0.9 % IV SOLN
500.0000 mg | INTRAVENOUS | Status: DC
  Administered 2019-09-25 (×2): 500 mg via INTRAVENOUS
  Filled 2019-09-24 (×2): qty 500

## 2019-09-24 MED ORDER — ONDANSETRON HCL 4 MG PO TABS: 4.0000 mg | ORAL_TABLET | Freq: Four times a day (QID) | ORAL | Status: DC | PRN

## 2019-09-24 MED ORDER — ALBUTEROL SULFATE HFA 108 (90 BASE) MCG/ACT IN AERS
2.0000 | INHALATION_SPRAY | Freq: Once | RESPIRATORY_TRACT | Status: AC
  Administered 2019-09-24: 2 via RESPIRATORY_TRACT
  Filled 2019-09-24: qty 6.7

## 2019-09-24 MED ORDER — ENOXAPARIN SODIUM 40 MG/0.4ML ~~LOC~~ SOLN
40.0000 mg | Freq: Every day | SUBCUTANEOUS | Status: DC
  Administered 2019-09-25 – 2019-09-27 (×3): 40 mg via SUBCUTANEOUS
  Filled 2019-09-24 (×3): qty 0.4

## 2019-09-24 MED ORDER — ONDANSETRON HCL 4 MG/2ML IJ SOLN: 4.0000 mg | Freq: Four times a day (QID) | INTRAMUSCULAR | Status: DC | PRN

## 2019-09-24 MED ORDER — MAGNESIUM SULFATE 2 GM/50ML IV SOLN
2.0000 g | Freq: Once | INTRAVENOUS | Status: AC
  Administered 2019-09-24: 2 g via INTRAVENOUS
  Filled 2019-09-24: qty 50

## 2019-09-24 MED ORDER — IPRATROPIUM BROMIDE 0.02 % IN SOLN
0.5000 mg | Freq: Four times a day (QID) | RESPIRATORY_TRACT | Status: DC
  Administered 2019-09-25 (×2): 0.5 mg via RESPIRATORY_TRACT
  Filled 2019-09-24 (×2): qty 2.5

## 2019-09-24 MED ORDER — GUAIFENESIN ER 600 MG PO TB12
600.0000 mg | ORAL_TABLET | Freq: Two times a day (BID) | ORAL | Status: DC
  Administered 2019-09-24 – 2019-09-27 (×6): 600 mg via ORAL
  Filled 2019-09-24 (×6): qty 1

## 2019-09-24 MED ORDER — SODIUM CHLORIDE 0.9 % IV SOLN
1.0000 g | INTRAVENOUS | Status: DC
  Administered 2019-09-24: 1 g via INTRAVENOUS
  Filled 2019-09-24: qty 10

## 2019-09-24 MED ORDER — METHYLPREDNISOLONE SODIUM SUCC 125 MG IJ SOLR
80.0000 mg | Freq: Four times a day (QID) | INTRAMUSCULAR | Status: DC
  Administered 2019-09-24 – 2019-09-25 (×2): 80 mg via INTRAVENOUS
  Filled 2019-09-24 (×2): qty 2

## 2019-09-24 MED ORDER — METHYLPREDNISOLONE SODIUM SUCC 125 MG IJ SOLR
125.0000 mg | Freq: Once | INTRAMUSCULAR | Status: AC
  Administered 2019-09-24: 125 mg via INTRAVENOUS
  Filled 2019-09-24: qty 2

## 2019-09-24 MED ORDER — SODIUM CHLORIDE 0.45 % IV SOLN
INTRAVENOUS | Status: DC
  Administered 2019-09-24: via INTRAVENOUS

## 2019-09-24 NOTE — ED Triage Notes (Signed)
Pt bib [facility] after c/o SOB and nasal congestion. Pt recently admitted to hospital for the same. Pt visibly labored breathing on arrival, however, SPO2 98% on room air with EMS.  EMS VS HR 110 BP 209/98 CBG 309  SPO2 98%
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
## 2019-09-24 NOTE — Patient Outreach (Signed)
[place] [facility]) Care Management  09/24/2019  Reginald Duncan 1951-04-15 [id number]   Referral received from hospital liaison for follow up.  Telephone call to [facility] spoke with Tammy who states that the patient is active with them.  She plans to have a nurse go out today for a visit if patient is available.  Plan: RN CM will close case as [facility] is involved.    Reginald Baseman, RN, MSN [place] [facility] Care Management Coordinator Direct Line [phone] Cell [phone] Toll Free: [phone]  Fax: [phone]
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
## 2019-09-25 LAB — CBC
HCT: 39.8 % (ref 39.0–52.0)
HCT: 40.3 % (ref 39.0–52.0)
Hemoglobin: 13 g/dL (ref 13.0–17.0)
Hemoglobin: 13.1 g/dL (ref 13.0–17.0)
MCH: 29.7 pg (ref 26.0–34.0)
MCH: 29.8 pg (ref 26.0–34.0)
MCHC: 32.3 g/dL (ref 30.0–36.0)
MCHC: 32.9 g/dL (ref 30.0–36.0)
MCV: 90.5 fL (ref 80.0–100.0)
MCV: 92 fL (ref 80.0–100.0)
Platelets: 186 10*3/uL (ref 150–400)
Platelets: 202 10*3/uL (ref 150–400)
RBC: 4.38 MIL/uL (ref 4.22–5.81)
RBC: 4.4 MIL/uL (ref 4.22–5.81)
RDW: 15.1 % (ref 11.5–15.5)
RDW: 15.2 % (ref 11.5–15.5)
WBC: 7.3 10*3/uL (ref 4.0–10.5)
WBC: 7.8 10*3/uL (ref 4.0–10.5)
nRBC: 0 % (ref 0.0–0.2)
nRBC: 0 % (ref 0.0–0.2)

## 2019-09-25 LAB — COMPREHENSIVE METABOLIC PANEL
ALT: 46 U/L — ABNORMAL HIGH (ref 0–44)
AST: 31 U/L (ref 15–41)
Albumin: 3.8 g/dL (ref 3.5–5.0)
Alkaline Phosphatase: 71 U/L (ref 38–126)
Anion gap: 17 — ABNORMAL HIGH (ref 5–15)
BUN: 29 mg/dL — ABNORMAL HIGH (ref 8–23)
CO2: 19 mmol/L — ABNORMAL LOW (ref 22–32)
Calcium: 9.3 mg/dL (ref 8.9–10.3)
Chloride: 101 mmol/L (ref 98–111)
Creatinine, Ser: 1.83 mg/dL — ABNORMAL HIGH (ref 0.61–1.24)
GFR calc Af Amer: 43 mL/min — ABNORMAL LOW (ref >60)
GFR calc non Af Amer: 37 mL/min — ABNORMAL LOW (ref >60)
Glucose, Bld: 509 mg/dL (ref 70–99)
Potassium: 3.5 mmol/L (ref 3.5–5.1)
Sodium: 137 mmol/L (ref 135–145)
Total Bilirubin: 0.6 mg/dL (ref 0.3–1.2)
Total Protein: 6.8 g/dL (ref 6.5–8.1)

## 2019-09-25 LAB — CBG MONITORING, ED
Glucose-Capillary: 594 mg/dL (ref 70–99)
Glucose-Capillary: >600 mg/dL (ref 70–99)
Glucose-Capillary: 556 mg/dL (ref 70–99)

## 2019-09-25 LAB — SARS CORONAVIRUS 2 (TAT 6-24 HRS): SARS Coronavirus 2: NEGATIVE

## 2019-09-25 LAB — GLUCOSE, CAPILLARY
Glucose-Capillary: 471 mg/dL — ABNORMAL HIGH (ref 70–99)
Glucose-Capillary: 521 mg/dL (ref 70–99)

## 2019-09-25 LAB — TROPONIN I (HIGH SENSITIVITY): Troponin I (High Sensitivity): 14 ng/L (ref <18)

## 2019-09-25 LAB — CREATININE, SERUM
Creatinine, Ser: 1.53 mg/dL — ABNORMAL HIGH (ref 0.61–1.24)
GFR calc Af Amer: 54 mL/min — ABNORMAL LOW (ref >60)
GFR calc non Af Amer: 46 mL/min — ABNORMAL LOW (ref >60)

## 2019-09-25 LAB — BRAIN NATRIURETIC PEPTIDE: B Natriuretic Peptide: 37.9 pg/mL (ref 0.0–100.0)

## 2019-09-25 MED ORDER — NON FORMULARY: 25.0000 [IU] | Freq: Once | Status: DC

## 2019-09-25 MED ORDER — INSULIN REGULAR HUMAN (CONC) 500 UNIT/ML ~~LOC~~ SOPN
25.0000 [IU] | PEN_INJECTOR | Freq: Once | SUBCUTANEOUS | Status: AC
  Administered 2019-09-25: 25 [IU] via SUBCUTANEOUS
  Filled 2019-09-25: qty 3

## 2019-09-25 MED ORDER — INSULIN GLARGINE 100 UNIT/ML ~~LOC~~ SOLN
10.0000 [IU] | Freq: Every day | SUBCUTANEOUS | Status: DC
  Administered 2019-09-26 (×2): 10 [IU] via SUBCUTANEOUS
  Filled 2019-09-25 (×3): qty 0.1

## 2019-09-25 MED ORDER — ROSUVASTATIN CALCIUM 20 MG PO TABS
20.0000 mg | ORAL_TABLET | Freq: Every evening | ORAL | Status: DC
  Administered 2019-09-25 – 2019-09-26 (×2): 20 mg via ORAL
  Filled 2019-09-25 (×3): qty 1

## 2019-09-25 MED ORDER — INSULIN REGULAR HUMAN (CONC) 500 UNIT/ML ~~LOC~~ SOPN
20.0000 [IU] | PEN_INJECTOR | Freq: Every day | SUBCUTANEOUS | Status: DC
  Administered 2019-09-26: 20 [IU] via SUBCUTANEOUS
  Filled 2019-09-25: qty 3

## 2019-09-25 MED ORDER — INSULIN ASPART 100 UNIT/ML ~~LOC~~ SOLN
30.0000 [IU] | Freq: Once | SUBCUTANEOUS | Status: AC
  Administered 2019-09-25: 30 [IU] via SUBCUTANEOUS

## 2019-09-25 MED ORDER — INSULIN REGULAR HUMAN (CONC) 500 UNIT/ML ~~LOC~~ SOPN: 10.0000 [IU] | PEN_INJECTOR | SUBCUTANEOUS | Status: DC

## 2019-09-25 MED ORDER — FUROSEMIDE 40 MG PO TABS
40.0000 mg | ORAL_TABLET | Freq: Two times a day (BID) | ORAL | Status: DC
  Administered 2019-09-25 – 2019-09-27 (×5): 40 mg via ORAL
  Filled 2019-09-25 (×4): qty 1
  Filled 2019-09-25: qty 2

## 2019-09-25 MED ORDER — PREGABALIN 75 MG PO CAPS
75.0000 mg | ORAL_CAPSULE | Freq: Every morning | ORAL | Status: DC
  Administered 2019-09-25 – 2019-09-27 (×3): 75 mg via ORAL
  Filled 2019-09-25 (×2): qty 1
  Filled 2019-09-25: qty 3

## 2019-09-25 MED ORDER — INSULIN REGULAR HUMAN (CONC) 500 UNIT/ML ~~LOC~~ SOPN
25.0000 [IU] | PEN_INJECTOR | Freq: Every day | SUBCUTANEOUS | Status: DC
  Administered 2019-09-25 – 2019-09-26 (×2): 25 [IU] via SUBCUTANEOUS
  Filled 2019-09-25: qty 3

## 2019-09-25 MED ORDER — CLOPIDOGREL BISULFATE 75 MG PO TABS
75.0000 mg | ORAL_TABLET | Freq: Every day | ORAL | Status: DC
  Administered 2019-09-25 – 2019-09-26 (×2): 75 mg via ORAL
  Filled 2019-09-25 (×2): qty 1

## 2019-09-25 MED ORDER — UMECLIDINIUM-VILANTEROL 62.5-25 MCG/INH IN AEPB
1.0000 | INHALATION_SPRAY | Freq: Every day | RESPIRATORY_TRACT | Status: DC
  Filled 2019-09-25 (×2): qty 14

## 2019-09-25 MED ORDER — IPRATROPIUM BROMIDE 0.02 % IN SOLN
0.5000 mg | Freq: Two times a day (BID) | RESPIRATORY_TRACT | Status: DC
  Administered 2019-09-25 – 2019-09-26 (×2): 0.5 mg via RESPIRATORY_TRACT
  Filled 2019-09-25 (×2): qty 2.5

## 2019-09-25 MED ORDER — FLUTICASONE PROPIONATE 50 MCG/ACT NA SUSP
2.0000 | Freq: Two times a day (BID) | NASAL | Status: DC
  Administered 2019-09-25 – 2019-09-27 (×5): 2 via NASAL
  Filled 2019-09-25: qty 16

## 2019-09-25 MED ORDER — INSULIN REGULAR HUMAN (CONC) 500 UNIT/ML ~~LOC~~ SOPN
10.0000 [IU] | PEN_INJECTOR | Freq: Once | SUBCUTANEOUS | Status: AC
  Administered 2019-09-25: 10 [IU] via SUBCUTANEOUS

## 2019-09-25 MED ORDER — TRAMADOL HCL 50 MG PO TABS
50.0000 mg | ORAL_TABLET | Freq: Once | ORAL | Status: AC
  Administered 2019-09-26: 12:00:00 50 mg via ORAL
  Filled 2019-09-25 (×2): qty 1

## 2019-09-25 MED ORDER — INSULIN REGULAR HUMAN (CONC) 500 UNIT/ML ~~LOC~~ SOPN
15.0000 [IU] | PEN_INJECTOR | Freq: Every day | SUBCUTANEOUS | Status: DC
  Filled 2019-09-25: qty 3

## 2019-09-25 MED ORDER — INSULIN ASPART 100 UNIT/ML ~~LOC~~ SOLN
2.0000 [IU] | Freq: Three times a day (TID) | SUBCUTANEOUS | Status: DC
  Administered 2019-09-26 (×2): 2 [IU] via SUBCUTANEOUS

## 2019-09-25 MED ORDER — AMLODIPINE BESYLATE 2.5 MG PO TABS
2.5000 mg | ORAL_TABLET | Freq: Every day | ORAL | Status: DC
  Administered 2019-09-25 – 2019-09-26 (×2): 2.5 mg via ORAL
  Filled 2019-09-25 (×2): qty 1

## 2019-09-25 MED ORDER — PREGABALIN 75 MG PO CAPS
150.0000 mg | ORAL_CAPSULE | Freq: Every day | ORAL | Status: DC
  Administered 2019-09-25 – 2019-09-26 (×2): 150 mg via ORAL
  Filled 2019-09-25 (×2): qty 2

## 2019-09-25 MED ORDER — INSULIN ASPART 100 UNIT/ML ~~LOC~~ SOLN
0.0000 [IU] | Freq: Three times a day (TID) | SUBCUTANEOUS | Status: DC
  Administered 2019-09-26: 7 [IU] via SUBCUTANEOUS
  Administered 2019-09-26: 20 [IU] via SUBCUTANEOUS
  Administered 2019-09-26: 9 [IU] via SUBCUTANEOUS
  Administered 2019-09-27: 20 [IU] via SUBCUTANEOUS
  Administered 2019-09-27: 06:00:00 7 [IU] via SUBCUTANEOUS

## 2019-09-25 MED ORDER — UMECLIDINIUM-VILANTEROL 62.5-25 MCG/INH IN AEPB: 1.0000 | INHALATION_SPRAY | RESPIRATORY_TRACT | Status: DC

## 2019-09-25 MED ORDER — ASPIRIN EC 81 MG PO TBEC
81.0000 mg | DELAYED_RELEASE_TABLET | Freq: Every day | ORAL | Status: DC
  Administered 2019-09-25 – 2019-09-26 (×2): 81 mg via ORAL
  Filled 2019-09-25 (×2): qty 1

## 2019-09-25 MED ORDER — METOPROLOL SUCCINATE ER 100 MG PO TB24
100.0000 mg | ORAL_TABLET | Freq: Every day | ORAL | Status: DC
  Administered 2019-09-25 – 2019-09-26 (×2): 100 mg via ORAL
  Filled 2019-09-25 (×2): qty 1

## 2019-09-25 MED ORDER — INSULIN REGULAR HUMAN (CONC) 500 UNIT/ML ~~LOC~~ SOPN
25.0000 [IU] | PEN_INJECTOR | Freq: Every day | SUBCUTANEOUS | Status: DC
  Administered 2019-09-25: 25 [IU] via SUBCUTANEOUS
  Filled 2019-09-25: qty 3

## 2019-09-25 MED ORDER — QUETIAPINE FUMARATE 300 MG PO TABS
300.0000 mg | ORAL_TABLET | Freq: Every day | ORAL | Status: DC
  Administered 2019-09-25 – 2019-09-26 (×2): 300 mg via ORAL
  Filled 2019-09-25 (×3): qty 1

## 2019-09-25 MED ORDER — VITAMIN D (ERGOCALCIFEROL) 1.25 MG (50000 UNIT) PO CAPS: 50000.0000 [IU] | ORAL_CAPSULE | ORAL | Status: DC

## 2019-09-25 MED ORDER — ALPRAZOLAM 0.5 MG PO TABS
0.5000 mg | ORAL_TABLET | Freq: Two times a day (BID) | ORAL | Status: DC | PRN
  Administered 2019-09-25 – 2019-09-26 (×2): 0.5 mg via ORAL
  Filled 2019-09-25 (×2): qty 1

## 2019-09-25 MED ORDER — INSULIN REGULAR HUMAN (CONC) 500 UNIT/ML ~~LOC~~ SOPN
10.0000 [IU] | PEN_INJECTOR | Freq: Every day | SUBCUTANEOUS | Status: DC
  Filled 2019-09-25: qty 3

## 2019-09-25 MED ORDER — SODIUM CHLORIDE 0.9 % IV SOLN
INTRAVENOUS | Status: DC | PRN
  Administered 2019-09-25: 250 mL via INTRAVENOUS

## 2019-09-25 MED ORDER — OMEGA-3-ACID ETHYL ESTERS 1 G PO CAPS
1.0000 g | ORAL_CAPSULE | Freq: Two times a day (BID) | ORAL | Status: DC
  Administered 2019-09-25 – 2019-09-27 (×5): 1 g via ORAL
  Filled 2019-09-25 (×6): qty 1

## 2019-09-25 MED ORDER — PREDNISONE 20 MG PO TABS
20.0000 mg | ORAL_TABLET | Freq: Every day | ORAL | Status: DC
  Administered 2019-09-26 – 2019-09-27 (×2): 20 mg via ORAL
  Filled 2019-09-25 (×2): qty 1

## 2019-09-25 MED ORDER — ICOSAPENT ETHYL 1 G PO CAPS: 2.0000 g | ORAL_CAPSULE | Freq: Two times a day (BID) | ORAL | Status: DC

## 2019-09-25 MED ORDER — INSULIN ASPART 100 UNIT/ML ~~LOC~~ SOLN: 10.0000 [IU] | Freq: Once | SUBCUTANEOUS | Status: DC

## 2019-09-25 MED ORDER — ALBUTEROL SULFATE (2.5 MG/3ML) 0.083% IN NEBU
2.5000 mg | INHALATION_SOLUTION | Freq: Two times a day (BID) | RESPIRATORY_TRACT | Status: DC
  Administered 2019-09-25 – 2019-09-26 (×3): 2.5 mg via RESPIRATORY_TRACT
  Filled 2019-09-25 (×3): qty 3

## 2019-09-25 NOTE — Progress Notes (Signed)
CBG>600 mg/dL per RN.  Recommend EndoTool/IV insulin.   Thanks  Adah Perl, RN, BC-ADM Inpatient Diabetes Coordinator Pager [phone] (8a-5p)
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
## 2019-09-25 NOTE — ED Notes (Signed)
Tele   Breakfast ordered  

## 2019-09-25 NOTE — ED Notes (Signed)
Diet was ordered for Lunch. 

## 2019-09-25 NOTE — ED Notes (Signed)
This RN notified Dr. Verlon Au of pt's glucose level 594; awaiting insulin coverage order.

## 2019-09-25 NOTE — ED Notes (Signed)
Patient CBG was reading Critical High.

## 2019-09-25 NOTE — Progress Notes (Signed)
Inpatient Diabetes Program Recommendations  AACE/ADA: New Consensus Statement on Inpatient Glycemic Control (2015)  Target Ranges:  Prepandial:   less than 140 mg/dL      Peak postprandial:   less than 180 mg/dL (1-2 hours)      Critically ill patients:  140 - 180 mg/dL   Lab Results  Component Value Date   GLUCAP 594 (HH) 09/25/2019   HGBA1C 9.3 (H) 09/01/2019    Review of Glycemic Control Results for BARKLEY, YANAK (MRN [id number]) as of 09/25/2019 13:12  Ref. Range 09/25/2019 12:19  Glucose-Capillary Latest Ref Range: 70 - 99 mg/dL 594 (HH)   Diabetes history: DM 2 Outpatient Diabetes medications:  U500 10 units breakfast, 15 units lunch and 15 units supper (per pharmacy review confirmed with patient) Current orders for Inpatient glycemic control:  U500 10-15 units tid with meals Novolog 2 units tid with meals Novolog senstive tid with meals  Inpatient Diabetes Program Recommendations:     Note patient was here in the hospital 11/21-11/26.  U500 dose was increased to 25 units tid with meals and patient still having elevated blood sugars priviously.    -Please increase U500 to 25 units tid with meals.  Also d/c meal coverage 2 units and increase Novolog correction to resistant q 4 hours.   Thanks,  Adah Perl, RN, BC-ADM Inpatient Diabetes Coordinator Pager [phone] (8a-5p)

## 2019-09-25 NOTE — ED Notes (Signed)
Diet ordered 

## 2019-09-25 NOTE — ED Notes (Signed)
Patient CBG was 594.

## 2019-09-25 NOTE — Progress Notes (Signed)
Hospitalist progress note + chart summary  Reginald Duncan [id number] DOB: Dec 25, 1950 DOA: 09/24/2019  PCP: [name], PA   Narrative:  68 year old black male recent COPD exacerbation admitted11/21 through 11/26 returns with COPD  Data Reviewed:  BUN/creatinine from baseline 30/1.5, troponins 49 range WBC 7.3 hemoglobin 13.1 platelet 186 CXR no active disease Assessment & Plan: Acute respiratory failure-Combo COPD vs AECHF diastolic-Weight up to Q000111Q, left with weight 130 Kg-looks dry-some cough but no wheeze and is significantly better he states than prior--d/c ceftriaxone, Cont Azithro BNP added on for today--Solumedrol 80 q6--> prednisone 20 mg 3/d--d/c iv saline-cont Mucinex, albuterol, Atrovent--resumed Anoro as well Patient not taking lasix as supposed to 40 iv od--resumed at lasix 40 bid IV MI s/p LAD stent and cardiac cath showing Multivessel disease 09/01/19--only med management -resume ASA Plavix amlodipine 2,5 Toprol Xl 100--Lasix as below--Needs OP F/u Dr. Saunders Revel  DM uncontrolled A1c 9.3 recentyl -Resumed U500 insulin 10 am, 15 noon and 15 pm---will add sensitive SSI as well CKD iiia-trends in the am with labs--OP follow up Dr. Lorrene Reid Prior CVA-continue meds as above and secondary prevention Chr LBP and Chr Hernia's--poor candidcacy abd surgery-might benefit for consideration ESI injections OP Depression/vertigo etc stable--continue meds    CHART REVIEW . 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 Readmit 11/21--11/26 with decopmpnesated CHF with 15 punds diuresed off and weight was 130 kg on d/c  . COPD on home oxygen . CKD 3 days--follow Dr. Lorrene Reid . CVA and vertigo   . OSA not on CPAP  . lower back pain status post lumbar epidural injections CVA and vertebral    Subjective: Awake alert feels much better since Rx overnight Tells me has mould at home and thinks that's what caused this  Consultants:    none Procedures:   n Antimicrobials:   Azithro    Objective: Vitals:   09/25/19 0400 09/25/19 0415 09/25/19 0445 09/25/19 0545  BP: (!) 144/83 (!) 162/79 (!) 143/94 139/83  Pulse: 100 (!) 103 (!) 101 (!) 102  Resp: (!) 23 18 19 18   SpO2: 91% 95% 95% 94%    Intake/Output Summary (Last 24 hours) at 09/25/2019 0753 Last data filed at 09/25/2019 0414 Gross per 24 hour  Intake 98.21 ml  Output -  Net 98.21 ml   There were no vitals filed for this visit.  Examination: Awake coherent in nad no jvd No wheeze slight rales no rhonchi abd soft but hernias present LE's look mildy swollen--excoriations present but improved from when I last saw him Neuro intact  Scheduled Meds: . albuterol  2.5 mg Nebulization BID  . amLODipine  2.5 mg Oral QHS  . aspirin EC  81 mg Oral QHS  . clopidogrel  75 mg Oral QHS  . enoxaparin (LOVENOX) injection  40 mg Subcutaneous Daily  . fluticasone  2 spray Each Nare TID  . furosemide  40 mg Oral BID  . guaiFENesin  600 mg Oral BID  . icosapent Ethyl  2 g Oral BID  . insulin aspart  0-9 Units Subcutaneous TID WC  . insulin aspart  2 Units Subcutaneous TID WC  . insulin regular human CONCENTRATED  10-15 Units Subcutaneous See admin instructions  . ipratropium  0.5 mg Nebulization BID  . metoprolol succinate  100 mg Oral QHS  . [START ON 09/26/2019] predniSONE  20 mg Oral QAC breakfast  . pregabalin  75-150 mg Oral See admin instructions  .  QUEtiapine  300 mg Oral QHS  . rosuvastatin  20 mg Oral QPM  . umeclidinium-vilanterol  1 puff Inhalation Q4H  . [START ON 09/29/2019] Vitamin D (Ergocalciferol)  50,000 Units Oral Q Mon   Continuous Infusions: . azithromycin Stopped (09/25/19 0149)     LOS: 1 day   Time spent: [place], MD [facility]  09/25/2019, 7:53 AM

## 2019-09-25 NOTE — Progress Notes (Signed)
RT called for pt in respiratory distress. During assessment pt noted to be tachypnic and tachycardic on RA. Pt walking through the hall and asked to come back in room to be assessed. Clear BBS throughout, no wheezes heard during auscultation. HR 121 RR 30 and SATs 94% on RA. Pt is complaining of a tightness in his chest that he says isn't his heart. I notified the RN of the possibility of needing an ECG to rule out abnormalities. Pt seems to be anxious at this time.

## 2019-09-25 NOTE — Progress Notes (Signed)
Pharmacy Home Medication Reconciliation Communication High Risk Medication: Humulin U-500 Insulin  Home dose of Humulin U-500 insulin was reordered for this patient. The dose was verified with the patient as listed below:  Type of syringe used at home:  U500 syringe Number or line on syringe to which patient draws up insulin: Patient confirms he draws up 10 units in AM, 15 units in afternoon, and 15 units in PM  Other comments pertinent to patient home dosing: Patient said that current dosing is not enough and PCP has been adjusting doses; however, he has missed recent appointments since he has been in and out of hospital.  Antonietta Jewel, PharmD, [place] Clinical Pharmacist  Phone: [phone]  Please check AMION for all [place] phone numbers After 10:00 PM, call [place] [phone] 09/25/2019  12:26 PM

## 2019-09-25 NOTE — Progress Notes (Addendum)
Pt CBG 521.  Regular insulin 25 units given at 1950.  Notified [place], NP.  Will continue to closely monitor CBG  TRH stated to recheck.  Repeat cbg 500.  TRH notified.     Orders placed for 10units insulin and 10 units lantus.

## 2019-09-25 NOTE — ED Notes (Signed)
Pt meds tubed to 3E.

## 2019-09-26 LAB — CBC WITH DIFFERENTIAL/PLATELET
Abs Immature Granulocytes: 0.12 10*3/uL — ABNORMAL HIGH (ref 0.00–0.07)
Basophils Absolute: 0 10*3/uL (ref 0.0–0.1)
Basophils Relative: 0 %
Eosinophils Absolute: 0 10*3/uL (ref 0.0–0.5)
Eosinophils Relative: 0 %
HCT: 37.4 % — ABNORMAL LOW (ref 39.0–52.0)
Hemoglobin: 12.3 g/dL — ABNORMAL LOW (ref 13.0–17.0)
Immature Granulocytes: 1 %
Lymphocytes Relative: 9 %
Lymphs Abs: 1.1 10*3/uL (ref 0.7–4.0)
MCH: 29.4 pg (ref 26.0–34.0)
MCHC: 32.9 g/dL (ref 30.0–36.0)
MCV: 89.5 fL (ref 80.0–100.0)
Monocytes Absolute: 1.1 10*3/uL — ABNORMAL HIGH (ref 0.1–1.0)
Monocytes Relative: 9 %
Neutro Abs: 10 10*3/uL — ABNORMAL HIGH (ref 1.7–7.7)
Neutrophils Relative %: 81 %
Platelets: 191 10*3/uL (ref 150–400)
RBC: 4.18 MIL/uL — ABNORMAL LOW (ref 4.22–5.81)
RDW: 15.5 % (ref 11.5–15.5)
WBC: 12.3 10*3/uL — ABNORMAL HIGH (ref 4.0–10.5)
nRBC: 0 % (ref 0.0–0.2)

## 2019-09-26 LAB — GLUCOSE, CAPILLARY
Glucose-Capillary: 500 mg/dL — ABNORMAL HIGH (ref 70–99)
Glucose-Capillary: 350 mg/dL — ABNORMAL HIGH (ref 70–99)
Glucose-Capillary: 305 mg/dL — ABNORMAL HIGH (ref 70–99)
Glucose-Capillary: 382 mg/dL — ABNORMAL HIGH (ref 70–99)
Glucose-Capillary: 404 mg/dL — ABNORMAL HIGH (ref 70–99)
Glucose-Capillary: 330 mg/dL — ABNORMAL HIGH (ref 70–99)
Glucose-Capillary: 290 mg/dL — ABNORMAL HIGH (ref 70–99)

## 2019-09-26 LAB — COMPREHENSIVE METABOLIC PANEL
ALT: 38 U/L (ref 0–44)
AST: 24 U/L (ref 15–41)
Albumin: 3.6 g/dL (ref 3.5–5.0)
Alkaline Phosphatase: 62 U/L (ref 38–126)
Anion gap: 13 (ref 5–15)
BUN: 32 mg/dL — ABNORMAL HIGH (ref 8–23)
CO2: 24 mmol/L (ref 22–32)
Calcium: 9.3 mg/dL (ref 8.9–10.3)
Chloride: 101 mmol/L (ref 98–111)
Creatinine, Ser: 1.56 mg/dL — ABNORMAL HIGH (ref 0.61–1.24)
GFR calc Af Amer: 52 mL/min — ABNORMAL LOW (ref >60)
GFR calc non Af Amer: 45 mL/min — ABNORMAL LOW (ref >60)
Glucose, Bld: 355 mg/dL — ABNORMAL HIGH (ref 70–99)
Potassium: 3.6 mmol/L (ref 3.5–5.1)
Sodium: 138 mmol/L (ref 135–145)
Total Bilirubin: 0.6 mg/dL (ref 0.3–1.2)
Total Protein: 6.4 g/dL — ABNORMAL LOW (ref 6.5–8.1)

## 2019-09-26 MED ORDER — IPRATROPIUM-ALBUTEROL 0.5-2.5 (3) MG/3ML IN SOLN
RESPIRATORY_TRACT | Status: AC
  Administered 2019-09-26: 3 mL
  Filled 2019-09-26: qty 3

## 2019-09-26 MED ORDER — AZITHROMYCIN 500 MG PO TABS
500.0000 mg | ORAL_TABLET | Freq: Every day | ORAL | Status: DC
  Administered 2019-09-26: 21:00:00 500 mg via ORAL
  Filled 2019-09-26: qty 1

## 2019-09-26 MED ORDER — INSULIN REGULAR HUMAN (CONC) 500 UNIT/ML ~~LOC~~ SOPN
30.0000 [IU] | PEN_INJECTOR | Freq: Three times a day (TID) | SUBCUTANEOUS | Status: DC
  Administered 2019-09-26 – 2019-09-27 (×3): 30 [IU] via SUBCUTANEOUS
  Filled 2019-09-26: qty 3

## 2019-09-26 MED ORDER — IPRATROPIUM-ALBUTEROL 0.5-2.5 (3) MG/3ML IN SOLN
3.0000 mL | Freq: Two times a day (BID) | RESPIRATORY_TRACT | Status: DC
  Administered 2019-09-27: 3 mL via RESPIRATORY_TRACT
  Filled 2019-09-26: qty 3

## 2019-09-26 NOTE — Progress Notes (Addendum)
Patient CBG 350 at 0533 but was 305 at [phone].  Pt arousable and can stand but drowsy.  States he does not feel light headed or currently feel as if his sugar is low.  RN notified TRH since SSI scheduled this AM.    0646-TRH Blount, NP stated to continue with current insulin order.

## 2019-09-26 NOTE — Progress Notes (Signed)
Inpatient Diabetes Program Recommendations  AACE/ADA: New Consensus Statement on Inpatient Glycemic Control (2015)  Target Ranges:  Prepandial:   less than 140 mg/dL      Peak postprandial:   less than 180 mg/dL (1-2 hours)      Critically ill patients:  140 - 180 mg/dL   Lab Results  Component Value Date   GLUCAP 305 (H) 09/26/2019   HGBA1C 9.3 (H) 09/01/2019    Review of Glycemic Control Results for Reginald Duncan, Reginald Duncan (MRN [id number]) as of 09/26/2019 10:30  Ref. Range 09/25/2019 18:35 09/25/2019 21:05 09/25/2019 21:57 09/26/2019 05:33 09/26/2019 06:38  Glucose-Capillary Latest Ref Range: 70 - 99 mg/dL 471 (H) 521 (HH) 500 (H) 350 (H) 305 (H)  Outpatient Diabetes medications:  U500 10 units breakfast, 15 units lunch and 15 units supper (per pharmacy review confirmed with patient) Current orders for Inpatient glycemic control:  U500 20 units with breakfast, U500 25 units with lunch, U500 25 units with supper Novolog 2 units tid with meals Novolog senstive tid with meals Inpatient Diabetes Program Recommendations:    Please d/c Novolog 2 units tid and Novolog sensitive.    Consider increasing U500 to 30 units tid with meals.  Add Novolog resistant correction tid with meals and HS.   Thanks  Adah Perl, RN, BC-ADM Inpatient Diabetes Coordinator Pager [phone] (8a-5p)

## 2019-09-26 NOTE — Progress Notes (Signed)
@  1630: BG 404. Called MD for sliding scale insulin. MD gave verbal order 20 units Novolog, and recheck BG after an hour. Gave 20 units Novolog and 30 units Humulin R.  Rechecked BG at 1815 showed 330.

## 2019-09-26 NOTE — Progress Notes (Addendum)
Hospitalist progress note + chart summary  Reginald Duncan [id number] DOB: [birth year] DOA: 09/24/2019  PCP: [name], PA   Narrative:  11 wM recent COPD exacerbation admitted11/21 through 11/26 returns with COPD  Data Reviewed:  BUN/creatinine from baseline 30/1.5--32/1.5,  WBC 7.3-->12/3 hemoglobin 13.1-->12.3 Assessment & Plan: Acute respiratory failure-Combo COPD vs AECHF diastolic-Weight up to Q000111Q, left with weight 130 Kg-overall slightly improved--Cont Azithro-potentially able to go home a.m. BNP added on for today--Solumedrol 80 q6--> prednisone 20 mg 3/d--d/c iv saline-cont Mucinex, albuterol, Atrovent--resumed Anoro as well Patient not taking lasix as supposed to 40 iv od-->t lasix 40 bid MI s/p LAD stent and cardiac cath showing Multivessel disease 09/01/19--only med management -resume ASA Plavix amlodipine 2,5 Toprol Xl 100,Needs OP F/u Dr. Saunders Revel  DM uncontrolled A1c 9.3 --- sugars were in the high 500s but was not in DKA  -increasing U500 to 30 3x daily and monitor trends and adjust carefully CKD iiia-trends in the am with labs--OP follow up Dr. Lorrene Reid Prior CVA-continue meds as above and secondary prevention Chr LBP and Chr Hernia's--poor candidcacy abd surgery-might benefit for consideration ESI injections OP Depression/vertigo etc stable--continue meds    CHART REVIEW . 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 Readmit 11/21--11/26 with decopmpnesated CHF with 15 punds diuresed off and weight was 130 kg on d/c  . COPD on home oxygen . CKD 3 days--follow Dr. Lorrene Reid . CVA and vertigo   . OSA not on CPAP  . lower back pain status post lumbar epidural injections CVA and vertebral    Subjective: Anxious yesterday and somewhat this morning-tells me has mold in his home and is working with his landlord to get it sorted out-not using oxygen at this time He is aware that he is stabilizing from my  perspective although he still has a wheeze but that I plan on probably sending him home for sugars are better controlled in the morning  Consultants:   none Procedures:   n Antimicrobials:   Azithro    Objective: Vitals:   09/26/19 0535 09/26/19 0728 09/26/19 0753 09/26/19 1201  BP:  (!) 163/102  (!) 156/95  Pulse:  94  97  Resp:  20  (!) 22  Temp:  97.6 F (36.4 C)  97.8 F (36.6 C)  TempSrc:  Oral  Oral  SpO2:  100% 98% 98%  Weight: 130 kg     Height:        Intake/Output Summary (Last 24 hours) at 09/26/2019 1451 Last data filed at 09/26/2019 1310 Gross per 24 hour  Intake 1414.73 ml  Output 3850 ml  Net -2435.27 ml   Filed Weights   09/25/19 0911 09/25/19 1804 09/26/19 0535  Weight: 134.4 kg 134 kg 130 kg    Examination: Awake coherent in nad no jvd Mild wheeze bilaterally Lower extremities mildly swollen Abdomen soft tenderness S1-S2 no murmur rub or gallop Neurologically intact no focal deficit   Scheduled Meds: . albuterol  2.5 mg Nebulization BID  . amLODipine  2.5 mg Oral QHS  . aspirin EC  81 mg Oral QHS  . clopidogrel  75 mg Oral QHS  . enoxaparin (LOVENOX) injection  40 mg Subcutaneous Daily  . fluticasone  2 spray Each Nare BID  . furosemide  40 mg Oral BID  . guaiFENesin  600 mg Oral BID  . insulin aspart  0-9 Units Subcutaneous TID WC  . insulin glargine  10  Units Subcutaneous QHS  . insulin regular human CONCENTRATED  30 Units Subcutaneous TID WC  . ipratropium  0.5 mg Nebulization BID  . metoprolol succinate  100 mg Oral QHS  . omega-3 acid ethyl esters  1 g Oral BID  . predniSONE  20 mg Oral QAC breakfast  . pregabalin  150 mg Oral QHS  . pregabalin  75 mg Oral q morning - 10a  . QUEtiapine  300 mg Oral QHS  . rosuvastatin  20 mg Oral QPM  . umeclidinium-vilanterol  1 puff Inhalation Daily  . [START ON 09/29/2019] Vitamin D (Ergocalciferol)  50,000 Units Oral Q Mon   Continuous Infusions: . sodium chloride 250 mL (09/25/19 2345)   . azithromycin 500 mg (09/25/19 2346)     LOS: 2 days   Time spent: [place], MD [facility]  09/26/2019, 2:51 PM

## 2019-09-27 LAB — CBC WITH DIFFERENTIAL/PLATELET
Abs Immature Granulocytes: 0.07 10*3/uL (ref 0.00–0.07)
Basophils Absolute: 0 10*3/uL (ref 0.0–0.1)
Basophils Relative: 0 %
Eosinophils Absolute: 0 10*3/uL (ref 0.0–0.5)
Eosinophils Relative: 1 %
HCT: 38.2 % — ABNORMAL LOW (ref 39.0–52.0)
Hemoglobin: 12.3 g/dL — ABNORMAL LOW (ref 13.0–17.0)
Immature Granulocytes: 1 %
Lymphocytes Relative: 18 %
Lymphs Abs: 1.6 10*3/uL (ref 0.7–4.0)
MCH: 29.5 pg (ref 26.0–34.0)
MCHC: 32.2 g/dL (ref 30.0–36.0)
MCV: 91.6 fL (ref 80.0–100.0)
Monocytes Absolute: 0.6 10*3/uL (ref 0.1–1.0)
Monocytes Relative: 7 %
Neutro Abs: 6.5 10*3/uL (ref 1.7–7.7)
Neutrophils Relative %: 73 %
Platelets: 171 10*3/uL (ref 150–400)
RBC: 4.17 MIL/uL — ABNORMAL LOW (ref 4.22–5.81)
RDW: 15.4 % (ref 11.5–15.5)
WBC: 8.8 10*3/uL (ref 4.0–10.5)
nRBC: 0 % (ref 0.0–0.2)

## 2019-09-27 LAB — BASIC METABOLIC PANEL
Anion gap: 15 (ref 5–15)
BUN: 33 mg/dL — ABNORMAL HIGH (ref 8–23)
CO2: 27 mmol/L (ref 22–32)
Calcium: 9 mg/dL (ref 8.9–10.3)
Chloride: 99 mmol/L (ref 98–111)
Creatinine, Ser: 1.27 mg/dL — ABNORMAL HIGH (ref 0.61–1.24)
GFR calc Af Amer: >60 mL/min (ref >60)
GFR calc non Af Amer: 58 mL/min — ABNORMAL LOW (ref >60)
Glucose, Bld: 284 mg/dL — ABNORMAL HIGH (ref 70–99)
Potassium: 3.4 mmol/L — ABNORMAL LOW (ref 3.5–5.1)
Sodium: 141 mmol/L (ref 135–145)

## 2019-09-27 LAB — GLUCOSE, CAPILLARY
Glucose-Capillary: 305 mg/dL — ABNORMAL HIGH (ref 70–99)
Glucose-Capillary: 437 mg/dL — ABNORMAL HIGH (ref 70–99)

## 2019-09-27 MED ORDER — PREDNISONE 20 MG PO TABS: 20.0000 mg | ORAL_TABLET | Freq: Every day | ORAL | 0 refills | Status: DC

## 2019-09-27 MED ORDER — ALPRAZOLAM 0.5 MG PO TABS: 0.5000 mg | ORAL_TABLET | Freq: Two times a day (BID) | ORAL | 0 refills | Status: DC | PRN

## 2019-09-27 MED ORDER — GUAIFENESIN ER 600 MG PO TB12: 600.0000 mg | ORAL_TABLET | Freq: Two times a day (BID) | ORAL | 0 refills | Status: DC

## 2019-09-27 NOTE — Progress Notes (Signed)
@  1130: BG 437. Notified MD, Received order to give 20 units NOvolog, and recheck an hour

## 2019-09-27 NOTE — Progress Notes (Signed)
Chaplain was able to get a short visit in with [place] prior to discharge. Reginald Duncan was tearful and remorseful that "his life is almost over and he has not done anything with it." Reginald Duncan did have a positive rise of energy when conversation shifted to his brother's dog. Reginald Duncan states that he is not currently able to have a pet of his own. Chaplain suggests pet therapy at a providers office once or twice a week to help with depressive episodes if social work or MD can assist in making this happen. Chaplain had prayer with Reginald Duncan and it was time for him to depart, as his brother had arrived to take him home.  Chaplain remains available as needs arise.   Chaplain Resident, Evelene Croon, M Div.

## 2019-09-27 NOTE — TOC Transition Note (Signed)
Transition of Care [facility]) - CM/SW Discharge Note   Patient Details  Name: Reginald Duncan MRN: [id number] Date of Birth: 12-04-1950  [facility]) CM/SW Contact:  Carles Collet, RN Phone Number: 09/27/2019, 10:13 AM   Clinical Narrative:   Spoke w patient over the phone. He states he has a PCP appointment on Tuesday. He states that he has bed bugs and his landlord is getting his apartment treated. He states that he has an active consult w [facility], but they are not coming back until after the bed bugs are removed.  No DME needs or other CM needs identified. Patient has transportation.     Final next level of care: Home/Self Care Barriers to Discharge: No Barriers Identified   Patient Goals and CMS Choice        Discharge Placement                       Discharge Plan and Services                                     Social Determinants of Health (SDOH) Interventions     Readmission Risk Interventions Readmission Risk Prevention Plan 09/27/2019  Transportation Screening Complete  PCP or Specialist Appt within 3-5 Days Complete  HRI or [place] Complete  Social Work Consult for [place] Planning/Counseling Complete  Palliative Care Screening Not Applicable  Medication Review [employer] photographer) Complete  Some recent data might be hidden
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
## 2019-09-29 NOTE — Consult Note (Signed)
   [facility] CM Inpatient Consult   09/29/2019  Reginald Duncan [birth year] [id number]    Patient was reviewed for 7 day and 30 day readmission with 27% high risk score for unplanned readmissions under his [facility] plan.   Patient had been active with [facility] care management services in the past.    Our community based plan of care has focused on disease management and community resource support.  Chart review reveals from MD history and physical on 09/24/19 as follows: [name] is a 68 y.o. male with medical history significant of advanced COPD with recurrent hospitalization last was on 09/13/2019, chronic kidney disease stage III, anxiety with claustrophobia, chronic back pain, depression, GERD, recurrent CVA, coronary artery disease, diabetes, hypertension and hyperlipidemia, who presents to the ER with progressive shortness of breath and cough which has failed outpatient treatment. He was recently admitted with same and treated.     Patient readmitted to the hospital with acute exacerbation of his COPD.    Inpatient DM coordinator following patient on this admission.    Patient had recently been active with [facility] program.  This writer called and spoke with Jenny Reichmann who confirms that the patient is still active and followed by [facility].  Patient will be followed by [facility] post discharge. No other [facility] Care Management follow up needed at this time.   For questions, please call:  Edwena Felty A. Nakiea Metzner, BSN, RN-BC [facility] Liaison Cell: [phone]

## 2019-09-30 DIAGNOSIS — J449 Chronic obstructive pulmonary disease, unspecified: Secondary | ICD-10-CM | POA: Diagnosis not present

## 2019-09-30 DIAGNOSIS — E1142 Type 2 diabetes mellitus with diabetic polyneuropathy: Secondary | ICD-10-CM | POA: Diagnosis not present

## 2019-09-30 DIAGNOSIS — J9610 Chronic respiratory failure, unspecified whether with hypoxia or hypercapnia: Secondary | ICD-10-CM | POA: Diagnosis not present

## 2019-09-30 DIAGNOSIS — I509 Heart failure, unspecified: Secondary | ICD-10-CM | POA: Diagnosis not present

## 2019-09-30 DIAGNOSIS — E1165 Type 2 diabetes mellitus with hyperglycemia: Secondary | ICD-10-CM | POA: Diagnosis not present

## 2019-09-30 DIAGNOSIS — E1149 Type 2 diabetes mellitus with other diabetic neurological complication: Secondary | ICD-10-CM | POA: Diagnosis not present

## 2019-09-30 DIAGNOSIS — F332 Major depressive disorder, recurrent severe without psychotic features: Secondary | ICD-10-CM | POA: Diagnosis not present

## 2019-10-01 ENCOUNTER — Telehealth (HOSPITAL_COMMUNITY): Payer: Self-pay | Admitting: Licensed Clinical Social Worker

## 2019-10-01 NOTE — Telephone Encounter (Signed)
CSW received call two days ago from patient who was very upset that his PCP was no longer going to be willing to see him- patient believes this is due to him not coming to appts but states he was in and out of the hospital for much of the last several weeks and this prohibited him from going- states he kept his PCP office well informed of this however.    CSW called pt PCP office to inquire and advocate on patients behalf.  PCP office reports that pt has been noncompliant for a long time with his insulin and with follow up with other offices so that was the reason for his dismissal- reports that they will continue to follow pt for 30 days but he needs to find a new PCP after that.  CSW reported to patient who expressed understanding- pt agreeable to [facility] referral for care management.  CSW called [facility] to place referral for assistance with finding new PCP and diabetes medication management but was informed they are not able to work with pt as he is already active with [facility] through the palliative program with [facility].  CSW called [facility] and updated them regarding current pt concerns.  CSW signing off at this time.  Reginald Ny, LCSW Clinical Social Worker [facility] Desk#: [phone] Cell#: [phone]

## 2019-10-03 ENCOUNTER — Other Ambulatory Visit: Payer: Self-pay

## 2019-10-03 NOTE — Patient Outreach (Signed)
[place] [facility]) Care Management  10/03/2019  Reginald Duncan 1950/10/26 [id number]    EMMI-General Discharge RED ON EMMI ALERT Day # 4 Date: 10/02/2019 Red Alert Reason: "Lost interest in things? Yes  Sad/hopeless/anxious/empty? Yes  Other questions/problems? Yes"   Outreach attempt #1 to patient. Spoke with patient who was very difficult to understand over the phone due to speech being very mumbled. He voices that he is doing fairly well. Reviewed and addressed red alerts with patient. He reports that he has felt a little down due to being sick so much lately but denies any depression sxs. He goes for PCP follow up appt on Monday and advised patient to discuss this with MD. Patient is active with Care connections program as well. Advised patient to discuss his feelings nad concerns with his medical team. He denies any other RN CM needs or concerns at this time. Patient has completed automated post discharge EMMI- General calls. Patient verbalizes no further RN CM needs or concerns at this time.     Plan: RN CM will close case at this time.   Enzo Montgomery, RN,BSN,CCM [place] Management Telephonic Care Management Coordinator Direct Phone: [phone] Toll Free: [phone] Fax: [phone]

## 2019-10-06 DIAGNOSIS — Z79899 Other long term (current) drug therapy: Secondary | ICD-10-CM | POA: Diagnosis not present

## 2019-10-06 DIAGNOSIS — G894 Chronic pain syndrome: Secondary | ICD-10-CM | POA: Diagnosis not present

## 2019-10-06 DIAGNOSIS — I509 Heart failure, unspecified: Secondary | ICD-10-CM | POA: Diagnosis not present

## 2019-10-06 DIAGNOSIS — M47816 Spondylosis without myelopathy or radiculopathy, lumbar region: Secondary | ICD-10-CM | POA: Diagnosis not present

## 2019-10-07 ENCOUNTER — Other Ambulatory Visit: Payer: Self-pay | Admitting: Internal Medicine

## 2019-10-07 DIAGNOSIS — E782 Mixed hyperlipidemia: Secondary | ICD-10-CM | POA: Diagnosis not present

## 2019-10-07 DIAGNOSIS — E1165 Type 2 diabetes mellitus with hyperglycemia: Secondary | ICD-10-CM | POA: Diagnosis not present

## 2019-10-07 DIAGNOSIS — I509 Heart failure, unspecified: Secondary | ICD-10-CM | POA: Diagnosis not present

## 2019-10-07 DIAGNOSIS — E1140 Type 2 diabetes mellitus with diabetic neuropathy, unspecified: Secondary | ICD-10-CM | POA: Diagnosis not present

## 2019-10-08 DIAGNOSIS — R0601 Orthopnea: Secondary | ICD-10-CM | POA: Diagnosis not present

## 2019-10-08 DIAGNOSIS — J80 Acute respiratory distress syndrome: Secondary | ICD-10-CM | POA: Diagnosis not present

## 2019-10-08 DIAGNOSIS — I10 Essential (primary) hypertension: Secondary | ICD-10-CM | POA: Diagnosis not present

## 2019-10-08 DIAGNOSIS — R0600 Dyspnea, unspecified: Secondary | ICD-10-CM | POA: Diagnosis not present

## 2019-10-08 DIAGNOSIS — R609 Edema, unspecified: Secondary | ICD-10-CM | POA: Diagnosis not present

## 2019-10-08 DIAGNOSIS — J441 Chronic obstructive pulmonary disease with (acute) exacerbation: Secondary | ICD-10-CM | POA: Diagnosis not present

## 2019-10-08 DIAGNOSIS — R000 Tachycardia, unspecified: Secondary | ICD-10-CM | POA: Diagnosis not present

## 2019-10-08 DIAGNOSIS — R0602 Shortness of breath: Secondary | ICD-10-CM | POA: Diagnosis not present

## 2019-10-09 ENCOUNTER — Encounter (HOSPITAL_COMMUNITY): Payer: Self-pay | Admitting: Internal Medicine

## 2019-10-09 ENCOUNTER — Emergency Department (HOSPITAL_COMMUNITY): Payer: Medicare HMO

## 2019-10-09 ENCOUNTER — Inpatient Hospital Stay (HOSPITAL_COMMUNITY)
Admission: EM | Admit: 2019-10-09 | Discharge: 2019-10-17 | DRG: 190 | Disposition: A | Discharge status: Home Health | Payer: Medicare HMO | Attending: Osteopathic Medicine | Admitting: Osteopathic Medicine

## 2019-10-09 ENCOUNTER — Other Ambulatory Visit: Payer: Self-pay

## 2019-10-09 DIAGNOSIS — E1165 Type 2 diabetes mellitus with hyperglycemia: Secondary | ICD-10-CM | POA: Diagnosis not present

## 2019-10-09 DIAGNOSIS — J441 Chronic obstructive pulmonary disease with (acute) exacerbation: Principal | ICD-10-CM | POA: Diagnosis present

## 2019-10-09 DIAGNOSIS — R778 Other specified abnormalities of plasma proteins: Secondary | ICD-10-CM

## 2019-10-09 DIAGNOSIS — I248 Other forms of acute ischemic heart disease: Secondary | ICD-10-CM | POA: Diagnosis present

## 2019-10-09 DIAGNOSIS — Z20828 Contact with and (suspected) exposure to other viral communicable diseases: Secondary | ICD-10-CM | POA: Diagnosis present

## 2019-10-09 DIAGNOSIS — I5033 Acute on chronic diastolic (congestive) heart failure: Secondary | ICD-10-CM | POA: Diagnosis present

## 2019-10-09 DIAGNOSIS — F329 Major depressive disorder, single episode, unspecified: Secondary | ICD-10-CM | POA: Diagnosis present

## 2019-10-09 DIAGNOSIS — R0902 Hypoxemia: Secondary | ICD-10-CM | POA: Diagnosis not present

## 2019-10-09 DIAGNOSIS — F419 Anxiety disorder, unspecified: Secondary | ICD-10-CM | POA: Diagnosis not present

## 2019-10-09 DIAGNOSIS — Z7902 Long term (current) use of antithrombotics/antiplatelets: Secondary | ICD-10-CM

## 2019-10-09 DIAGNOSIS — E1151 Type 2 diabetes mellitus with diabetic peripheral angiopathy without gangrene: Secondary | ICD-10-CM | POA: Diagnosis present

## 2019-10-09 DIAGNOSIS — Z515 Encounter for palliative care: Secondary | ICD-10-CM

## 2019-10-09 DIAGNOSIS — R279 Unspecified lack of coordination: Secondary | ICD-10-CM | POA: Diagnosis not present

## 2019-10-09 DIAGNOSIS — I130 Hypertensive heart and chronic kidney disease with heart failure and stage 1 through stage 4 chronic kidney disease, or unspecified chronic kidney disease: Secondary | ICD-10-CM | POA: Diagnosis present

## 2019-10-09 DIAGNOSIS — K219 Gastro-esophageal reflux disease without esophagitis: Secondary | ICD-10-CM | POA: Diagnosis present

## 2019-10-09 DIAGNOSIS — I959 Hypotension, unspecified: Secondary | ICD-10-CM | POA: Diagnosis not present

## 2019-10-09 DIAGNOSIS — F0390 Unspecified dementia without behavioral disturbance: Secondary | ICD-10-CM | POA: Diagnosis present

## 2019-10-09 DIAGNOSIS — Z9641 Presence of insulin pump (external) (internal): Secondary | ICD-10-CM | POA: Diagnosis present

## 2019-10-09 DIAGNOSIS — Z955 Presence of coronary angioplasty implant and graft: Secondary | ICD-10-CM

## 2019-10-09 DIAGNOSIS — I5030 Unspecified diastolic (congestive) heart failure: Secondary | ICD-10-CM

## 2019-10-09 DIAGNOSIS — I69334 Monoplegia of upper limb following cerebral infarction affecting left non-dominant side: Secondary | ICD-10-CM | POA: Diagnosis not present

## 2019-10-09 DIAGNOSIS — N179 Acute kidney failure, unspecified: Secondary | ICD-10-CM | POA: Diagnosis present

## 2019-10-09 DIAGNOSIS — F410 Panic disorder [episodic paroxysmal anxiety] without agoraphobia: Secondary | ICD-10-CM | POA: Diagnosis present

## 2019-10-09 DIAGNOSIS — I252 Old myocardial infarction: Secondary | ICD-10-CM | POA: Diagnosis not present

## 2019-10-09 DIAGNOSIS — N183 Chronic kidney disease, stage 3 unspecified: Secondary | ICD-10-CM | POA: Diagnosis present

## 2019-10-09 DIAGNOSIS — Z888 Allergy status to other drugs, medicaments and biological substances status: Secondary | ICD-10-CM

## 2019-10-09 DIAGNOSIS — G4733 Obstructive sleep apnea (adult) (pediatric): Secondary | ICD-10-CM | POA: Diagnosis present

## 2019-10-09 DIAGNOSIS — J449 Chronic obstructive pulmonary disease, unspecified: Secondary | ICD-10-CM | POA: Diagnosis not present

## 2019-10-09 DIAGNOSIS — E785 Hyperlipidemia, unspecified: Secondary | ICD-10-CM | POA: Diagnosis present

## 2019-10-09 DIAGNOSIS — E1110 Type 2 diabetes mellitus with ketoacidosis without coma: Secondary | ICD-10-CM | POA: Diagnosis present

## 2019-10-09 DIAGNOSIS — E1140 Type 2 diabetes mellitus with diabetic neuropathy, unspecified: Secondary | ICD-10-CM | POA: Diagnosis present

## 2019-10-09 DIAGNOSIS — R0689 Other abnormalities of breathing: Secondary | ICD-10-CM | POA: Diagnosis not present

## 2019-10-09 DIAGNOSIS — R0602 Shortness of breath: Secondary | ICD-10-CM | POA: Diagnosis not present

## 2019-10-09 DIAGNOSIS — R739 Hyperglycemia, unspecified: Secondary | ICD-10-CM

## 2019-10-09 DIAGNOSIS — F40240 Claustrophobia: Secondary | ICD-10-CM | POA: Diagnosis present

## 2019-10-09 DIAGNOSIS — F411 Generalized anxiety disorder: Secondary | ICD-10-CM | POA: Diagnosis present

## 2019-10-09 DIAGNOSIS — Z743 Need for continuous supervision: Secondary | ICD-10-CM | POA: Diagnosis not present

## 2019-10-09 DIAGNOSIS — Z7982 Long term (current) use of aspirin: Secondary | ICD-10-CM

## 2019-10-09 DIAGNOSIS — J80 Acute respiratory distress syndrome: Secondary | ICD-10-CM | POA: Diagnosis not present

## 2019-10-09 DIAGNOSIS — E1122 Type 2 diabetes mellitus with diabetic chronic kidney disease: Secondary | ICD-10-CM | POA: Diagnosis present

## 2019-10-09 DIAGNOSIS — N189 Chronic kidney disease, unspecified: Secondary | ICD-10-CM | POA: Diagnosis not present

## 2019-10-09 DIAGNOSIS — Z6841 Body Mass Index (BMI) 40.0 and over, adult: Secondary | ICD-10-CM | POA: Diagnosis not present

## 2019-10-09 DIAGNOSIS — Z794 Long term (current) use of insulin: Secondary | ICD-10-CM

## 2019-10-09 DIAGNOSIS — E0810 Diabetes mellitus due to underlying condition with ketoacidosis without coma: Secondary | ICD-10-CM | POA: Diagnosis not present

## 2019-10-09 DIAGNOSIS — I2510 Atherosclerotic heart disease of native coronary artery without angina pectoris: Secondary | ICD-10-CM | POA: Diagnosis present

## 2019-10-09 DIAGNOSIS — E119 Type 2 diabetes mellitus without complications: Secondary | ICD-10-CM

## 2019-10-09 DIAGNOSIS — Z560 Unemployment, unspecified: Secondary | ICD-10-CM

## 2019-10-09 DIAGNOSIS — Z79899 Other long term (current) drug therapy: Secondary | ICD-10-CM

## 2019-10-09 DIAGNOSIS — G8929 Other chronic pain: Secondary | ICD-10-CM | POA: Diagnosis present

## 2019-10-09 DIAGNOSIS — R7989 Other specified abnormal findings of blood chemistry: Secondary | ICD-10-CM | POA: Diagnosis not present

## 2019-10-09 DIAGNOSIS — Z7952 Long term (current) use of systemic steroids: Secondary | ICD-10-CM

## 2019-10-09 DIAGNOSIS — J9611 Chronic respiratory failure with hypoxia: Secondary | ICD-10-CM | POA: Diagnosis present

## 2019-10-09 DIAGNOSIS — Z8249 Family history of ischemic heart disease and other diseases of the circulatory system: Secondary | ICD-10-CM

## 2019-10-09 DIAGNOSIS — Z9981 Dependence on supplemental oxygen: Secondary | ICD-10-CM

## 2019-10-09 DIAGNOSIS — R0600 Dyspnea, unspecified: Secondary | ICD-10-CM | POA: Diagnosis not present

## 2019-10-09 DIAGNOSIS — R0601 Orthopnea: Secondary | ICD-10-CM | POA: Diagnosis not present

## 2019-10-09 DIAGNOSIS — D631 Anemia in chronic kidney disease: Secondary | ICD-10-CM | POA: Insufficient documentation

## 2019-10-09 LAB — CBC
HCT: 40.9 % (ref 39.0–52.0)
Hemoglobin: 13.4 g/dL (ref 13.0–17.0)
MCH: 30.3 pg (ref 26.0–34.0)
MCHC: 32.8 g/dL (ref 30.0–36.0)
MCV: 92.5 fL (ref 80.0–100.0)
Platelets: 192 10*3/uL (ref 150–400)
RBC: 4.42 MIL/uL (ref 4.22–5.81)
RDW: 15.1 % (ref 11.5–15.5)
WBC: 11.5 10*3/uL — ABNORMAL HIGH (ref 4.0–10.5)
nRBC: 0 % (ref 0.0–0.2)

## 2019-10-09 LAB — URINALYSIS, ROUTINE W REFLEX MICROSCOPIC
Bacteria, UA: NONE SEEN
Bilirubin Urine: NEGATIVE
Glucose, UA: >=500 mg/dL — AB
Hgb urine dipstick: NEGATIVE
Ketones, ur: NEGATIVE mg/dL
Leukocytes,Ua: NEGATIVE
Nitrite: NEGATIVE
Protein, ur: 30 mg/dL — AB
Specific Gravity, Urine: 1.017 (ref 1.005–1.030)
pH: 5 (ref 5.0–8.0)

## 2019-10-09 LAB — BASIC METABOLIC PANEL
Anion gap: 19 — ABNORMAL HIGH (ref 5–15)
Anion gap: 14 (ref 5–15)
Anion gap: 16 — ABNORMAL HIGH (ref 5–15)
BUN: 30 mg/dL — ABNORMAL HIGH (ref 8–23)
BUN: 32 mg/dL — ABNORMAL HIGH (ref 8–23)
BUN: 34 mg/dL — ABNORMAL HIGH (ref 8–23)
CO2: 21 mmol/L — ABNORMAL LOW (ref 22–32)
CO2: 22 mmol/L (ref 22–32)
CO2: 20 mmol/L — ABNORMAL LOW (ref 22–32)
Calcium: 9.5 mg/dL (ref 8.9–10.3)
Calcium: 9.4 mg/dL (ref 8.9–10.3)
Calcium: 9 mg/dL (ref 8.9–10.3)
Chloride: 98 mmol/L (ref 98–111)
Chloride: 101 mmol/L (ref 98–111)
Chloride: 101 mmol/L (ref 98–111)
Creatinine, Ser: 1.95 mg/dL — ABNORMAL HIGH (ref 0.61–1.24)
Creatinine, Ser: 1.59 mg/dL — ABNORMAL HIGH (ref 0.61–1.24)
Creatinine, Ser: 1.8 mg/dL — ABNORMAL HIGH (ref 0.61–1.24)
GFR calc Af Amer: 40 mL/min — ABNORMAL LOW (ref >60)
GFR calc Af Amer: 51 mL/min — ABNORMAL LOW (ref >60)
GFR calc Af Amer: 44 mL/min — ABNORMAL LOW (ref >60)
GFR calc non Af Amer: 35 mL/min — ABNORMAL LOW (ref >60)
GFR calc non Af Amer: 44 mL/min — ABNORMAL LOW (ref >60)
GFR calc non Af Amer: 38 mL/min — ABNORMAL LOW (ref >60)
Glucose, Bld: 473 mg/dL — ABNORMAL HIGH (ref 70–99)
Glucose, Bld: 456 mg/dL — ABNORMAL HIGH (ref 70–99)
Glucose, Bld: 420 mg/dL — ABNORMAL HIGH (ref 70–99)
Potassium: 4.2 mmol/L (ref 3.5–5.1)
Potassium: 4.9 mmol/L (ref 3.5–5.1)
Potassium: 4.4 mmol/L (ref 3.5–5.1)
Sodium: 138 mmol/L (ref 135–145)
Sodium: 137 mmol/L (ref 135–145)
Sodium: 137 mmol/L (ref 135–145)

## 2019-10-09 LAB — BRAIN NATRIURETIC PEPTIDE: B Natriuretic Peptide: 58.2 pg/mL (ref 0.0–100.0)

## 2019-10-09 LAB — GLUCOSE, CAPILLARY
Glucose-Capillary: 377 mg/dL — ABNORMAL HIGH (ref 70–99)
Glucose-Capillary: 418 mg/dL — ABNORMAL HIGH (ref 70–99)
Glucose-Capillary: 358 mg/dL — ABNORMAL HIGH (ref 70–99)

## 2019-10-09 LAB — POC SARS CORONAVIRUS 2 AG -  ED: SARS Coronavirus 2 Ag: NEGATIVE

## 2019-10-09 LAB — RESPIRATORY PANEL BY RT PCR (FLU A&B, COVID)
Influenza A by PCR: NEGATIVE
Influenza B by PCR: NEGATIVE
SARS Coronavirus 2 by RT PCR: NEGATIVE

## 2019-10-09 LAB — CBG MONITORING, ED
Glucose-Capillary: 424 mg/dL — ABNORMAL HIGH (ref 70–99)
Glucose-Capillary: 457 mg/dL — ABNORMAL HIGH (ref 70–99)
Glucose-Capillary: 460 mg/dL — ABNORMAL HIGH (ref 70–99)

## 2019-10-09 LAB — TROPONIN I (HIGH SENSITIVITY)
Troponin I (High Sensitivity): 22 ng/L — ABNORMAL HIGH (ref <18)
Troponin I (High Sensitivity): 86 ng/L — ABNORMAL HIGH (ref <18)

## 2019-10-09 LAB — BETA-HYDROXYBUTYRIC ACID: Beta-Hydroxybutyric Acid: 0.37 mmol/L — ABNORMAL HIGH (ref 0.05–0.27)

## 2019-10-09 MED ORDER — HYDROCODONE-ACETAMINOPHEN 7.5-325 MG PO TABS
1.0000 | ORAL_TABLET | Freq: Three times a day (TID) | ORAL | Status: DC | PRN
  Administered 2019-10-10 – 2019-10-17 (×7): 1 via ORAL
  Filled 2019-10-09 (×7): qty 1

## 2019-10-09 MED ORDER — ONDANSETRON HCL 4 MG PO TABS: 4.0000 mg | ORAL_TABLET | Freq: Four times a day (QID) | ORAL | Status: DC | PRN

## 2019-10-09 MED ORDER — SODIUM CHLORIDE 0.9% FLUSH
3.0000 mL | Freq: Two times a day (BID) | INTRAVENOUS | Status: DC
  Administered 2019-10-10 – 2019-10-17 (×14): 3 mL via INTRAVENOUS

## 2019-10-09 MED ORDER — IPRATROPIUM BROMIDE 0.02 % IN SOLN
0.5000 mg | Freq: Three times a day (TID) | RESPIRATORY_TRACT | Status: DC
  Administered 2019-10-10: 0.5 mg via RESPIRATORY_TRACT
  Filled 2019-10-09: qty 2.5

## 2019-10-09 MED ORDER — ALBUTEROL SULFATE (2.5 MG/3ML) 0.083% IN NEBU
2.5000 mg | INHALATION_SOLUTION | Freq: Three times a day (TID) | RESPIRATORY_TRACT | Status: DC
  Administered 2019-10-10: 2.5 mg via RESPIRATORY_TRACT
  Filled 2019-10-09: qty 3

## 2019-10-09 MED ORDER — DOCUSATE SODIUM 100 MG PO CAPS
100.0000 mg | ORAL_CAPSULE | Freq: Two times a day (BID) | ORAL | Status: DC
  Administered 2019-10-10 – 2019-10-17 (×9): 100 mg via ORAL
  Filled 2019-10-09 (×13): qty 1

## 2019-10-09 MED ORDER — QUETIAPINE FUMARATE 300 MG PO TABS
300.0000 mg | ORAL_TABLET | Freq: Every day | ORAL | Status: DC
  Administered 2019-10-10 – 2019-10-16 (×8): 300 mg via ORAL
  Filled 2019-10-09 (×10): qty 1

## 2019-10-09 MED ORDER — SODIUM CHLORIDE 0.9% FLUSH: 3.0000 mL | INTRAVENOUS | Status: DC | PRN

## 2019-10-09 MED ORDER — ALBUTEROL SULFATE (2.5 MG/3ML) 0.083% IN NEBU
2.5000 mg | INHALATION_SOLUTION | Freq: Four times a day (QID) | RESPIRATORY_TRACT | Status: DC
  Filled 2019-10-09: qty 3

## 2019-10-09 MED ORDER — AZITHROMYCIN 250 MG PO TABS
500.0000 mg | ORAL_TABLET | Freq: Every day | ORAL | Status: AC
  Administered 2019-10-09 – 2019-10-13 (×5): 500 mg via ORAL
  Filled 2019-10-09 (×5): qty 2

## 2019-10-09 MED ORDER — INSULIN REGULAR(HUMAN) IN NACL 100-0.9 UT/100ML-% IV SOLN
INTRAVENOUS | Status: DC
  Administered 2019-10-09: 28 [IU]/h via INTRAVENOUS
  Administered 2019-10-09: 17 [IU]/h via INTRAVENOUS
  Administered 2019-10-10: 23 [IU]/h via INTRAVENOUS
  Administered 2019-10-10: 25 [IU]/h via INTRAVENOUS
  Filled 2019-10-09 (×6): qty 100

## 2019-10-09 MED ORDER — ACETAMINOPHEN 650 MG RE SUPP: 650.0000 mg | Freq: Four times a day (QID) | RECTAL | Status: DC | PRN

## 2019-10-09 MED ORDER — CARVEDILOL 25 MG PO TABS
25.0000 mg | ORAL_TABLET | Freq: Two times a day (BID) | ORAL | Status: DC
  Administered 2019-10-10 – 2019-10-17 (×15): 25 mg via ORAL
  Filled 2019-10-09 (×4): qty 1
  Filled 2019-10-09: qty 2
  Filled 2019-10-09 (×11): qty 1

## 2019-10-09 MED ORDER — MAGNESIUM SULFATE 2 GM/50ML IV SOLN
2.0000 g | Freq: Once | INTRAVENOUS | Status: AC
  Administered 2019-10-09: 2 g via INTRAVENOUS
  Filled 2019-10-09: qty 50

## 2019-10-09 MED ORDER — AMLODIPINE BESYLATE 5 MG PO TABS
2.5000 mg | ORAL_TABLET | Freq: Every day | ORAL | Status: DC
  Administered 2019-10-09 – 2019-10-16 (×8): 2.5 mg via ORAL
  Filled 2019-10-09 (×8): qty 1

## 2019-10-09 MED ORDER — GUAIFENESIN ER 600 MG PO TB12
600.0000 mg | ORAL_TABLET | Freq: Two times a day (BID) | ORAL | Status: DC
  Administered 2019-10-09 – 2019-10-17 (×16): 600 mg via ORAL
  Filled 2019-10-09 (×16): qty 1

## 2019-10-09 MED ORDER — IPRATROPIUM-ALBUTEROL 0.5-2.5 (3) MG/3ML IN SOLN
3.0000 mL | Freq: Once | RESPIRATORY_TRACT | Status: AC
  Administered 2019-10-09: 3 mL via RESPIRATORY_TRACT
  Filled 2019-10-09: qty 3

## 2019-10-09 MED ORDER — FUROSEMIDE 10 MG/ML IJ SOLN
40.0000 mg | Freq: Once | INTRAMUSCULAR | Status: AC
  Administered 2019-10-09: 19:00:00 40 mg via INTRAVENOUS
  Filled 2019-10-09: qty 4

## 2019-10-09 MED ORDER — ASPIRIN EC 81 MG PO TBEC
81.0000 mg | DELAYED_RELEASE_TABLET | Freq: Every day | ORAL | Status: DC
  Administered 2019-10-09 – 2019-10-16 (×8): 81 mg via ORAL
  Filled 2019-10-09 (×8): qty 1

## 2019-10-09 MED ORDER — ALBUTEROL SULFATE HFA 108 (90 BASE) MCG/ACT IN AERS
2.0000 | INHALATION_SPRAY | Freq: Once | RESPIRATORY_TRACT | Status: AC
  Administered 2019-10-09: 2 via RESPIRATORY_TRACT
  Filled 2019-10-09: qty 6.7

## 2019-10-09 MED ORDER — DEXTROSE 50 % IV SOLN: 0.0000 mL | INTRAVENOUS | Status: DC | PRN

## 2019-10-09 MED ORDER — DEXTROSE-NACL 5-0.45 % IV SOLN: INTRAVENOUS | Status: DC

## 2019-10-09 MED ORDER — BUDESONIDE 0.25 MG/2ML IN SUSP
0.2500 mg | Freq: Two times a day (BID) | RESPIRATORY_TRACT | Status: DC
  Administered 2019-10-09 – 2019-10-17 (×17): 0.25 mg via RESPIRATORY_TRACT
  Filled 2019-10-09 (×19): qty 2

## 2019-10-09 MED ORDER — CARVEDILOL 25 MG PO TABS: 75.0000 mg | ORAL_TABLET | Freq: Two times a day (BID) | ORAL | Status: DC

## 2019-10-09 MED ORDER — SODIUM CHLORIDE 0.9 % IV SOLN: 250.0000 mL | INTRAVENOUS | Status: DC | PRN

## 2019-10-09 MED ORDER — METHYLPREDNISOLONE SODIUM SUCC 125 MG IJ SOLR
125.0000 mg | Freq: Once | INTRAMUSCULAR | Status: AC
  Administered 2019-10-09: 15:00:00 125 mg via INTRAVENOUS
  Filled 2019-10-09: qty 2

## 2019-10-09 MED ORDER — VITAMIN D (ERGOCALCIFEROL) 1.25 MG (50000 UNIT) PO CAPS
50000.0000 [IU] | ORAL_CAPSULE | ORAL | Status: DC
  Administered 2019-10-13: 50000 [IU] via ORAL
  Filled 2019-10-09: qty 1

## 2019-10-09 MED ORDER — ALBUTEROL SULFATE HFA 108 (90 BASE) MCG/ACT IN AERS: 1.0000 | INHALATION_SPRAY | Freq: Once | RESPIRATORY_TRACT | Status: DC

## 2019-10-09 MED ORDER — ALPRAZOLAM 0.5 MG PO TABS
0.5000 mg | ORAL_TABLET | Freq: Two times a day (BID) | ORAL | Status: DC | PRN
  Administered 2019-10-09 – 2019-10-10 (×3): 0.5 mg via ORAL
  Filled 2019-10-09 (×3): qty 1

## 2019-10-09 MED ORDER — HEPARIN SODIUM (PORCINE) 5000 UNIT/ML IJ SOLN
5000.0000 [IU] | Freq: Three times a day (TID) | INTRAMUSCULAR | Status: DC
  Administered 2019-10-09 – 2019-10-17 (×23): 5000 [IU] via SUBCUTANEOUS
  Filled 2019-10-09 (×24): qty 1

## 2019-10-09 MED ORDER — INSULIN ASPART 100 UNIT/ML ~~LOC~~ SOLN
10.0000 [IU] | Freq: Once | SUBCUTANEOUS | Status: AC
  Administered 2019-10-09: 18:00:00 10 [IU] via INTRAVENOUS

## 2019-10-09 MED ORDER — CLOPIDOGREL BISULFATE 75 MG PO TABS
75.0000 mg | ORAL_TABLET | Freq: Every day | ORAL | Status: DC
  Administered 2019-10-09 – 2019-10-16 (×8): 75 mg via ORAL
  Filled 2019-10-09 (×8): qty 1

## 2019-10-09 MED ORDER — ONDANSETRON HCL 4 MG/2ML IJ SOLN: 4.0000 mg | Freq: Four times a day (QID) | INTRAMUSCULAR | Status: DC | PRN

## 2019-10-09 MED ORDER — METOPROLOL TARTRATE 50 MG PO TABS
50.0000 mg | ORAL_TABLET | Freq: Once | ORAL | Status: AC
  Administered 2019-10-09: 50 mg via ORAL
  Filled 2019-10-09: qty 1

## 2019-10-09 MED ORDER — IPRATROPIUM BROMIDE 0.02 % IN SOLN
0.5000 mg | Freq: Four times a day (QID) | RESPIRATORY_TRACT | Status: DC
  Administered 2019-10-09: 0.5 mg via RESPIRATORY_TRACT
  Filled 2019-10-09: qty 2.5

## 2019-10-09 MED ORDER — METHYLPREDNISOLONE SODIUM SUCC 40 MG IJ SOLR
40.0000 mg | Freq: Two times a day (BID) | INTRAMUSCULAR | Status: DC
  Administered 2019-10-10: 40 mg via INTRAVENOUS
  Filled 2019-10-09: qty 1

## 2019-10-09 MED ORDER — NITROGLYCERIN 0.4 MG SL SUBL: 0.4000 mg | SUBLINGUAL_TABLET | SUBLINGUAL | Status: DC | PRN

## 2019-10-09 MED ORDER — ROSUVASTATIN CALCIUM 20 MG PO TABS
20.0000 mg | ORAL_TABLET | Freq: Every day | ORAL | Status: DC
  Administered 2019-10-09 – 2019-10-17 (×9): 20 mg via ORAL
  Filled 2019-10-09 (×9): qty 1

## 2019-10-09 MED ORDER — SODIUM CHLORIDE 0.9 % IV SOLN: INTRAVENOUS | Status: DC

## 2019-10-09 MED ORDER — BISACODYL 5 MG PO TBEC: 5.0000 mg | DELAYED_RELEASE_TABLET | Freq: Every day | ORAL | Status: DC | PRN

## 2019-10-09 MED ORDER — ACETAMINOPHEN 325 MG PO TABS: 650.0000 mg | ORAL_TABLET | Freq: Four times a day (QID) | ORAL | Status: DC | PRN

## 2019-10-09 NOTE — ED Notes (Signed)
Report given to [facility], RN on 2W

## 2019-10-09 NOTE — ED Provider Notes (Signed)
Pt signed out from [facility] pending symptomatic improvement and 2nd troponin.  The 2nd troponin has increased to 86.  He is still sob and is wheezing.  Duoneb ordered along with mag.  His poc covid neg.  2hr pcr covid ordered.  Pt d/w Dr. Tyrell Antonio (triad) for admission.  She asked me to call cards.  I did and they will see him.  Reginald Duncan was evaluated in Emergency Department on 10/09/2019 for the symptoms described in the history of present illness. He was evaluated in the context of the global COVID-19 pandemic, which necessitated consideration that the patient might be at risk for infection with the SARS-CoV-2 virus that causes COVID-19. Institutional protocols and algorithms that pertain to the evaluation of patients at risk for COVID-19 are in a state of rapid change based on information released by regulatory bodies including the CDC and federal and state organizations. These policies and algorithms were followed during the patient's care in the ED.   Isla Pence, MD 10/09/19 [phone]

## 2019-10-09 NOTE — ED Notes (Addendum)
Walking Sa02 92% at lowest on RA
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
## 2019-10-09 NOTE — Progress Notes (Addendum)
Pt arrived on unit, resting comfortably with fluids at the bedside according to ordered diet. Pt has belongings bag at the bedside, which contains cash. Pt was encouraged to lock it up with security, but repeatedly denied - he does not want to let it go for fear it will get lost. Pt reports that he has $650 in cash in a yellow envelope but did not let this RN see it to count. On call Bodenheimer paged - pt does not want to be on fluids for fear of edema (pt has bilateral lower extremity pitting +2 edema) and reports that he has gained many pounds rapidly and recently. He also wanted to restart his home metoprolol 50mg .   Pt has dyspnea on exertion and willl desat into the high 80's - 2L o2 via nasal cannula was attempted to be placed on the patient but pt refused. The nasal cannula was left at the bedside with encouragement to use when needed. When resting, pt o2 sats are in the low 90's.

## 2019-10-09 NOTE — ED Triage Notes (Signed)
To ED via [facility] from urgent care in [place]- with c/o shortness of breath x 2 months-- pt is able to talk in complete sentences-- was seen at [place] emergency dept last night for same- received duonebs with relief-- states had a covid test but does not know results
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
## 2019-10-10 ENCOUNTER — Encounter (HOSPITAL_COMMUNITY): Payer: Self-pay | Admitting: Internal Medicine

## 2019-10-10 DIAGNOSIS — F419 Anxiety disorder, unspecified: Secondary | ICD-10-CM | POA: Diagnosis not present

## 2019-10-10 DIAGNOSIS — I248 Other forms of acute ischemic heart disease: Secondary | ICD-10-CM | POA: Diagnosis present

## 2019-10-10 DIAGNOSIS — J9611 Chronic respiratory failure with hypoxia: Secondary | ICD-10-CM | POA: Diagnosis present

## 2019-10-10 DIAGNOSIS — F40240 Claustrophobia: Secondary | ICD-10-CM | POA: Diagnosis present

## 2019-10-10 DIAGNOSIS — N179 Acute kidney failure, unspecified: Secondary | ICD-10-CM | POA: Diagnosis present

## 2019-10-10 DIAGNOSIS — F0390 Unspecified dementia without behavioral disturbance: Secondary | ICD-10-CM | POA: Diagnosis present

## 2019-10-10 DIAGNOSIS — F329 Major depressive disorder, single episode, unspecified: Secondary | ICD-10-CM | POA: Diagnosis present

## 2019-10-10 DIAGNOSIS — E1122 Type 2 diabetes mellitus with diabetic chronic kidney disease: Secondary | ICD-10-CM | POA: Diagnosis present

## 2019-10-10 DIAGNOSIS — N183 Chronic kidney disease, stage 3 unspecified: Secondary | ICD-10-CM | POA: Diagnosis present

## 2019-10-10 DIAGNOSIS — I252 Old myocardial infarction: Secondary | ICD-10-CM | POA: Diagnosis not present

## 2019-10-10 DIAGNOSIS — Z20828 Contact with and (suspected) exposure to other viral communicable diseases: Secondary | ICD-10-CM | POA: Diagnosis present

## 2019-10-10 DIAGNOSIS — I5030 Unspecified diastolic (congestive) heart failure: Secondary | ICD-10-CM | POA: Diagnosis not present

## 2019-10-10 DIAGNOSIS — R778 Other specified abnormalities of plasma proteins: Secondary | ICD-10-CM

## 2019-10-10 DIAGNOSIS — G8929 Other chronic pain: Secondary | ICD-10-CM | POA: Diagnosis present

## 2019-10-10 DIAGNOSIS — F411 Generalized anxiety disorder: Secondary | ICD-10-CM | POA: Diagnosis present

## 2019-10-10 DIAGNOSIS — E1110 Type 2 diabetes mellitus with ketoacidosis without coma: Secondary | ICD-10-CM | POA: Diagnosis present

## 2019-10-10 DIAGNOSIS — E1151 Type 2 diabetes mellitus with diabetic peripheral angiopathy without gangrene: Secondary | ICD-10-CM | POA: Diagnosis present

## 2019-10-10 DIAGNOSIS — G4733 Obstructive sleep apnea (adult) (pediatric): Secondary | ICD-10-CM | POA: Diagnosis present

## 2019-10-10 DIAGNOSIS — I2510 Atherosclerotic heart disease of native coronary artery without angina pectoris: Secondary | ICD-10-CM | POA: Diagnosis present

## 2019-10-10 DIAGNOSIS — Z6841 Body Mass Index (BMI) 40.0 and over, adult: Secondary | ICD-10-CM | POA: Diagnosis not present

## 2019-10-10 DIAGNOSIS — F410 Panic disorder [episodic paroxysmal anxiety] without agoraphobia: Secondary | ICD-10-CM | POA: Diagnosis present

## 2019-10-10 DIAGNOSIS — I69334 Monoplegia of upper limb following cerebral infarction affecting left non-dominant side: Secondary | ICD-10-CM | POA: Diagnosis not present

## 2019-10-10 DIAGNOSIS — Z515 Encounter for palliative care: Secondary | ICD-10-CM | POA: Diagnosis not present

## 2019-10-10 DIAGNOSIS — J441 Chronic obstructive pulmonary disease with (acute) exacerbation: Principal | ICD-10-CM

## 2019-10-10 DIAGNOSIS — I5033 Acute on chronic diastolic (congestive) heart failure: Secondary | ICD-10-CM | POA: Diagnosis present

## 2019-10-10 DIAGNOSIS — I130 Hypertensive heart and chronic kidney disease with heart failure and stage 1 through stage 4 chronic kidney disease, or unspecified chronic kidney disease: Secondary | ICD-10-CM | POA: Diagnosis present

## 2019-10-10 DIAGNOSIS — E785 Hyperlipidemia, unspecified: Secondary | ICD-10-CM | POA: Diagnosis present

## 2019-10-10 DIAGNOSIS — E0810 Diabetes mellitus due to underlying condition with ketoacidosis without coma: Secondary | ICD-10-CM | POA: Diagnosis not present

## 2019-10-10 LAB — GLUCOSE, CAPILLARY
Glucose-Capillary: 285 mg/dL — ABNORMAL HIGH (ref 70–99)
Glucose-Capillary: 261 mg/dL — ABNORMAL HIGH (ref 70–99)
Glucose-Capillary: 236 mg/dL — ABNORMAL HIGH (ref 70–99)
Glucose-Capillary: 215 mg/dL — ABNORMAL HIGH (ref 70–99)
Glucose-Capillary: 188 mg/dL — ABNORMAL HIGH (ref 70–99)
Glucose-Capillary: 165 mg/dL — ABNORMAL HIGH (ref 70–99)
Glucose-Capillary: 160 mg/dL — ABNORMAL HIGH (ref 70–99)
Glucose-Capillary: 208 mg/dL — ABNORMAL HIGH (ref 70–99)
Glucose-Capillary: 269 mg/dL — ABNORMAL HIGH (ref 70–99)
Glucose-Capillary: 452 mg/dL — ABNORMAL HIGH (ref 70–99)
Glucose-Capillary: 327 mg/dL — ABNORMAL HIGH (ref 70–99)
Glucose-Capillary: 335 mg/dL — ABNORMAL HIGH (ref 70–99)

## 2019-10-10 LAB — CBC
HCT: 35.4 % — ABNORMAL LOW (ref 39.0–52.0)
Hemoglobin: 11.6 g/dL — ABNORMAL LOW (ref 13.0–17.0)
MCH: 29.8 pg (ref 26.0–34.0)
MCHC: 32.8 g/dL (ref 30.0–36.0)
MCV: 91 fL (ref 80.0–100.0)
Platelets: 171 10*3/uL (ref 150–400)
RBC: 3.89 MIL/uL — ABNORMAL LOW (ref 4.22–5.81)
RDW: 15.6 % — ABNORMAL HIGH (ref 11.5–15.5)
WBC: 10 10*3/uL (ref 4.0–10.5)
nRBC: 0 % (ref 0.0–0.2)

## 2019-10-10 LAB — BASIC METABOLIC PANEL
Anion gap: 13 (ref 5–15)
Anion gap: 15 (ref 5–15)
Anion gap: 15 (ref 5–15)
BUN: 33 mg/dL — ABNORMAL HIGH (ref 8–23)
BUN: 33 mg/dL — ABNORMAL HIGH (ref 8–23)
BUN: 35 mg/dL — ABNORMAL HIGH (ref 8–23)
CO2: 23 mmol/L (ref 22–32)
CO2: 24 mmol/L (ref 22–32)
CO2: 24 mmol/L (ref 22–32)
Calcium: 9.3 mg/dL (ref 8.9–10.3)
Calcium: 9.2 mg/dL (ref 8.9–10.3)
Calcium: 9.2 mg/dL (ref 8.9–10.3)
Chloride: 102 mmol/L (ref 98–111)
Chloride: 102 mmol/L (ref 98–111)
Chloride: 100 mmol/L (ref 98–111)
Creatinine, Ser: 1.72 mg/dL — ABNORMAL HIGH (ref 0.61–1.24)
Creatinine, Ser: 1.57 mg/dL — ABNORMAL HIGH (ref 0.61–1.24)
Creatinine, Ser: 1.62 mg/dL — ABNORMAL HIGH (ref 0.61–1.24)
GFR calc Af Amer: 47 mL/min — ABNORMAL LOW (ref >60)
GFR calc Af Amer: 52 mL/min — ABNORMAL LOW (ref >60)
GFR calc Af Amer: 50 mL/min — ABNORMAL LOW (ref >60)
GFR calc non Af Amer: 40 mL/min — ABNORMAL LOW (ref >60)
GFR calc non Af Amer: 45 mL/min — ABNORMAL LOW (ref >60)
GFR calc non Af Amer: 43 mL/min — ABNORMAL LOW (ref >60)
Glucose, Bld: 278 mg/dL — ABNORMAL HIGH (ref 70–99)
Glucose, Bld: 240 mg/dL — ABNORMAL HIGH (ref 70–99)
Glucose, Bld: 412 mg/dL — ABNORMAL HIGH (ref 70–99)
Potassium: 3.6 mmol/L (ref 3.5–5.1)
Potassium: 3.8 mmol/L (ref 3.5–5.1)
Potassium: 4.2 mmol/L (ref 3.5–5.1)
Sodium: 138 mmol/L (ref 135–145)
Sodium: 141 mmol/L (ref 135–145)
Sodium: 139 mmol/L (ref 135–145)

## 2019-10-10 LAB — COMPREHENSIVE METABOLIC PANEL
ALT: 42 U/L (ref 0–44)
AST: 25 U/L (ref 15–41)
Albumin: 3.3 g/dL — ABNORMAL LOW (ref 3.5–5.0)
Alkaline Phosphatase: 50 U/L (ref 38–126)
Anion gap: 14 (ref 5–15)
BUN: 35 mg/dL — ABNORMAL HIGH (ref 8–23)
CO2: 24 mmol/L (ref 22–32)
Calcium: 9.5 mg/dL (ref 8.9–10.3)
Chloride: 103 mmol/L (ref 98–111)
Creatinine, Ser: 1.69 mg/dL — ABNORMAL HIGH (ref 0.61–1.24)
GFR calc Af Amer: 48 mL/min — ABNORMAL LOW (ref >60)
GFR calc non Af Amer: 41 mL/min — ABNORMAL LOW (ref >60)
Glucose, Bld: 203 mg/dL — ABNORMAL HIGH (ref 70–99)
Potassium: 3.5 mmol/L (ref 3.5–5.1)
Sodium: 141 mmol/L (ref 135–145)
Total Bilirubin: 0.6 mg/dL (ref 0.3–1.2)
Total Protein: 5.9 g/dL — ABNORMAL LOW (ref 6.5–8.1)

## 2019-10-10 LAB — BETA-HYDROXYBUTYRIC ACID
Beta-Hydroxybutyric Acid: 0.15 mmol/L (ref 0.05–0.27)
Beta-Hydroxybutyric Acid: 0.17 mmol/L (ref 0.05–0.27)

## 2019-10-10 LAB — MRSA PCR SCREENING: MRSA by PCR: NEGATIVE

## 2019-10-10 MED ORDER — IPRATROPIUM BROMIDE 0.02 % IN SOLN
0.5000 mg | Freq: Two times a day (BID) | RESPIRATORY_TRACT | Status: DC
  Administered 2019-10-10 – 2019-10-11 (×2): 0.5 mg via RESPIRATORY_TRACT
  Filled 2019-10-10 (×2): qty 2.5

## 2019-10-10 MED ORDER — FUROSEMIDE 10 MG/ML IJ SOLN
40.0000 mg | Freq: Two times a day (BID) | INTRAMUSCULAR | Status: DC
  Administered 2019-10-10 – 2019-10-14 (×9): 40 mg via INTRAVENOUS
  Filled 2019-10-10 (×10): qty 4

## 2019-10-10 MED ORDER — INSULIN ASPART 100 UNIT/ML ~~LOC~~ SOLN
0.0000 [IU] | Freq: Three times a day (TID) | SUBCUTANEOUS | Status: DC
  Administered 2019-10-10: 11 [IU] via SUBCUTANEOUS
  Administered 2019-10-10: 20 [IU] via SUBCUTANEOUS
  Administered 2019-10-11: 15 [IU] via SUBCUTANEOUS
  Administered 2019-10-11: 20 [IU] via SUBCUTANEOUS
  Administered 2019-10-11: 25 [IU] via SUBCUTANEOUS
  Administered 2019-10-12 (×2): 20 [IU] via SUBCUTANEOUS
  Administered 2019-10-13 (×2): 4 [IU] via SUBCUTANEOUS
  Administered 2019-10-14: 3 [IU] via SUBCUTANEOUS
  Administered 2019-10-14: 15 [IU] via SUBCUTANEOUS
  Administered 2019-10-14: 20 [IU] via SUBCUTANEOUS
  Administered 2019-10-15: 4 [IU] via SUBCUTANEOUS
  Administered 2019-10-15: 3 [IU] via SUBCUTANEOUS
  Administered 2019-10-16: 25 [IU] via SUBCUTANEOUS
  Administered 2019-10-16: 11 [IU] via SUBCUTANEOUS
  Administered 2019-10-16: 15 [IU] via SUBCUTANEOUS
  Administered 2019-10-17: 11 [IU] via SUBCUTANEOUS

## 2019-10-10 MED ORDER — INSULIN REGULAR HUMAN (CONC) 500 UNIT/ML ~~LOC~~ SOPN
50.0000 [IU] | PEN_INJECTOR | Freq: Three times a day (TID) | SUBCUTANEOUS | Status: DC
  Administered 2019-10-10 (×2): 50 [IU] via SUBCUTANEOUS
  Filled 2019-10-10: qty 3

## 2019-10-10 MED ORDER — INSULIN REGULAR HUMAN (CONC) 500 UNIT/ML ~~LOC~~ SOPN: 30.0000 [IU] | PEN_INJECTOR | Freq: Three times a day (TID) | SUBCUTANEOUS | Status: DC

## 2019-10-10 MED ORDER — INSULIN DETEMIR 100 UNIT/ML ~~LOC~~ SOLN
20.0000 [IU] | Freq: Every day | SUBCUTANEOUS | Status: DC
  Filled 2019-10-10: qty 0.2

## 2019-10-10 MED ORDER — METHYLPREDNISOLONE SODIUM SUCC 125 MG IJ SOLR
60.0000 mg | Freq: Three times a day (TID) | INTRAMUSCULAR | Status: DC
  Administered 2019-10-10 – 2019-10-11 (×3): 60 mg via INTRAVENOUS
  Filled 2019-10-10 (×3): qty 2

## 2019-10-10 MED ORDER — FLUTICASONE PROPIONATE 50 MCG/ACT NA SUSP
1.0000 | Freq: Every day | NASAL | Status: DC
  Administered 2019-10-10 – 2019-10-17 (×7): 1 via NASAL
  Filled 2019-10-10: qty 16

## 2019-10-10 MED ORDER — ALBUTEROL SULFATE (2.5 MG/3ML) 0.083% IN NEBU
2.5000 mg | INHALATION_SOLUTION | Freq: Two times a day (BID) | RESPIRATORY_TRACT | Status: DC
  Administered 2019-10-10 – 2019-10-11 (×2): 2.5 mg via RESPIRATORY_TRACT
  Filled 2019-10-10 (×2): qty 3

## 2019-10-10 MED ORDER — GUAIFENESIN-CODEINE 100-10 MG/5ML PO SOLN
5.0000 mL | Freq: Four times a day (QID) | ORAL | Status: DC | PRN
  Administered 2019-10-10 – 2019-10-11 (×2): 5 mL via ORAL
  Filled 2019-10-10 (×3): qty 5

## 2019-10-10 MED ORDER — INSULIN ASPART 100 UNIT/ML ~~LOC~~ SOLN
0.0000 [IU] | Freq: Every day | SUBCUTANEOUS | Status: DC
  Administered 2019-10-10: 4 [IU] via SUBCUTANEOUS
  Administered 2019-10-12: 3 [IU] via SUBCUTANEOUS
  Administered 2019-10-14 – 2019-10-15 (×2): 2 [IU] via SUBCUTANEOUS

## 2019-10-10 NOTE — Progress Notes (Addendum)
Inpatient Diabetes Program Recommendations  AACE/ADA: New Consensus Statement on Inpatient Glycemic Control (2015)  Target Ranges:  Prepandial:   less than 140 mg/dL      Peak postprandial:   less than 180 mg/dL (1-2 hours)      Critically ill patients:  140 - 180 mg/dL   Lab Results  Component Value Date   GLUCAP 165 (H) 10/10/2019   HGBA1C 9.3 (H) 09/01/2019    Review of Glycemic Control Results for Reginald Duncan, Reginald Duncan (MRN [id number]) as of 10/10/2019 09:38  Ref. Range 10/10/2019 03:28 10/10/2019 05:13 10/10/2019 06:18 10/10/2019 07:54  Glucose-Capillary Latest Ref Range: 70 - 99 mg/dL 236 (H) 215 (H) 188 (H) 165 (H)   Diabetes history: DM 2 Outpatient Diabetes medications:  U500 insulin- 15 units tid with meals (per patient he was taking double this and still having high blood sugars) Current orders for Inpatient glycemic control:  Transitioning off insulin drip-->Levemir 20 units daily/Novolog resistant tid with meals and HS  Inpatient Diabetes Program Recommendations:    Please consider d/c of Levemir and add back patient's U500 insulin.  Consider adding U500 30 units tid with meals.    Talked with patient by phone regarding home glycemic control.  He states that blood sugars have not been controlled at home and that he usually takes twice what he is supposed too with the U500.  He has started seeing Endocrinologist, Dr. Georgeanna Harrison in [facility] as well.  He states that he is concerned about his kidneys and his blood sugars running so high.  We discussed the need for monitoring and possible adjustments of insulin.  Will continue to follow.   Thanks  Reginald Perl, RN, BC-ADM Inpatient Diabetes Coordinator Pager [phone] (8a-5p)   10:45- Spoke with Pharmacists, [name].  He spoke to patient and confirmed that patient is actually using a Regular BD syringe (orange top) and drawing up to 10-15 units for his U500 dose.  This would mean he is actually getting 100-150 units tid with meals.   Agree with starting U500 50 units tid with meals (1/2 of home dose) and titrate up based on patient needs.

## 2019-10-10 NOTE — Progress Notes (Signed)
Went to confirm home dose of insulin since patient uses concentrated product  From patient:  He uses a vial of U-500 insulin and draws up his dose using a regular insulin syringe for U-100. He draws to 15 units, which would result in a U-500 dose of 75 units TID.  Recently he has been giving himself more than that at times because he disagrees with titration plan of his MD.  Discussed with Dr Starla Link and Diabetes Coordinator to ensure everyone was on same page.  Also was asked to investigated duplicate beta blockers on patient's med rec  Per patient he is on Toprol XL but was recently ordered carvedilol but he does not know why. He has yet to pick up at pharmacy. Spoke with pharmacy who says this was prescribed by Dr Alanson Aly who comes up as an endocrinologist.   This was relayed to Dr Starla Link, but likely needs cardiology input. He has consulted them but a rounding team has not been assigned.  Barth Kirks, PharmD, BCPS, BCCCP Clinical Pharmacist [phone]  Please check AMION for all [place] numbers  10/10/2019 11:09 AM

## 2019-10-10 NOTE — Plan of Care (Signed)
Nutrition Education Note  RD consulted for nutrition education regarding new onset CHF.  Spoke with pt at bedside. Pt reports that he is still having difficulty breathing at this time. Pt reports that his appetite at this time is good but that "I'm not getting enough food." Noted pt completed 100% of breakfast tray and 100% of a Glucerna Shake.  Pt reports having a decreased appetite over the last year. Pt states that he typically eats 1 meal daily that includes a sandwich with [place] and a slice of cheese. Pt may snack on an oatmeal cookie and may have a beverage from a convenience store. Pt reports that he rarely sits or lays down due to SOB. Pt believes the his LE swelling is due to the fact that he is always standing up. Pt states that he stopped buying and using salt years ago.  RD provided "Low Sodium Nutrition Therapy" handout from the Academy of [employer] Dietetics. Reviewed patient's dietary recall. Provided examples on ways to decrease sodium intake in diet. Discouraged intake of processed foods and use of salt shaker. Encouraged fresh fruits and vegetables as well as whole grain sources of carbohydrates to maximize fiber intake.   RD discussed why it is important for patient to adhere to diet recommendations, and emphasized the role of fluids, foods to avoid, and importance of weighing self daily. Teach back method used.  Expect fair compliance.  Body mass index is 40.6 kg/m. Pt meets criteria for obesity class III based on current BMI.  Current diet order is Heart Healthy/Carb Modified, patient is consuming approximately 100% of meals at this time. Labs and medications reviewed. No further nutrition interventions warranted at this time. RD contact information provided. If additional nutrition issues arise, please re-consult RD.    Gaynell Face, MS, RD, LDN Inpatient Clinical Dietitian Pager: [phone] Weekend/After Hours: [phone]

## 2019-10-10 NOTE — Progress Notes (Addendum)
Pt sleeps with mouth open sitting up; hx of sleep apnea - will desat into low 80's but then come back to mid 90's on room air. Nasal cannula available for patient, but pt refuses - he is sleeping sitting up with temperature cold - reports that he is comfortable. Pt refuses cpap hs d/t claustrophobia. Frequent POCT CBG checks; pt on blood thinners from home, very hard to stop bleeding from fingers after CBG checks. IV insulin drip continues to be titrated.  Pt has consistently refused all additional IV fluids w/ his insulin gtt.

## 2019-10-10 NOTE — Progress Notes (Signed)
Pt explains that at home he has "three different insulins" and that it is difficult to manage them - "I don't have the brains for it". He says that it is hard to open his fridge and figure out what is going on. He is hoping to have a regiment that is easy to manage when he leaves the hospital. He says that after his strokes, it has left him forgetful and confused.

## 2019-10-10 NOTE — Progress Notes (Signed)
   10/10/19 1700  Clinical Encounter Type  Visited With Patient;Health care provider  Visit Type Initial;Social support  Referral From Patient  Spiritual Encounters  Spiritual Needs Grief support;Emotional;Brochure  Stress Factors  Patient Stress Factors Major life changes;Loss of control;Health changes   Delivered AD pkt to pt, let him know to ask to speak to chaplain again if has questions or if here next week and ready to sign with notary.  Pt is struggling with fears surrounding death, esp concerned about possible nature of death due to one of his diseases.  May benefit from add'l support and opportunities to talk about his fears and concerns.  Temple Pacini [place], [phone]

## 2019-10-10 NOTE — Progress Notes (Addendum)
Patient ID: Reginald Duncan, male   DOB: October 11, 1951, 68 y.o.   MRN: [id number]  PROGRESS NOTE    Reginald Duncan  [id number] DOB: [birth year] DOA: 10/09/2019 PCP: [name], PA   Brief Narrative:  68 year old male with history of CAD status post LAD stent in 2014, CVA unspecified, COPD with recent recurrent hospitalization with last discharge on 09/27/2019, chronic hypoxic respiratory failure on supplemental oxygen only intermittently, chronic kidney disease stage III, diabetes mellitus type 2, hypertension, OSA, anxiety with claustrophobia, chronic back pain, depression, GERD, hypertension, hyperlipidemia and acute coronary syndrome in November 2020 with cardiac cath showing multiple vessel coronary artery disease with recommendations to treat medically as per cardiology presented with worsening shortness of breath and almost 20 pound weight gain.  Chest x-ray showed mild lingular atelectasis.  COVID-19 testing was negative.  He was admitted with COPD exacerbation.  Assessment & Plan:  COPD exacerbation Chronic hypoxic respiratory failure requiring supplemental oxygen at home only intermittently -Does not feel well.  Will increase Solu-Medrol to 60 mg IV every 8 hours for today.  Continue nebs and Pulmicort. -Continue oral Zithromax. -Respiratory PCR and COVID-19 testing negative -Chest x-ray was negative for acute abnormality. -If respiratory status does not improve, will consider CT of the chest and/or pulmonary evaluation -Incentive spirometry.  Acute on chronic diastolic heart failure -Presented with orthopnea and 20 pound weight gain and lower extremity edema -Patient received a dose of Lasix on admission.  Renal function stable.  Will start Lasix 40 mg IV every 12 hours. -Strict input and output.  Daily weights.  Fluid restriction.  Continue Coreg  Early DKA/diabetes mellitus type 2 uncontrolled with hyperglycemia -Presented with anion gap of 19 with hyperglycemia.  Was started on  insulin drip.  Anion gap is closed.  We will switch to home regimen of insulin.  Diabetes coordinator consult.  Carb modified diet. -CBGs with SSI  Acute kidney injury on chronic renal disease stage III -Creatinine 1.2-1.5 at baseline per records -Presented with creatinine of 1.9.  Creatinine 1.69 today.  Monitor.  Lasix plan as above  Morbid obesity -Outpatient follow-up  Obstructive sleep apnea -Continue CPAP at night  History of coronary disease Mildly elevated troponin -Patient had recent cardiac catheterization in November 2020 which showed multivessel disease and cardiology had recommended medical management.  Continue Coreg and statin. -Cardiology has been consulted.  Follow recommendations.  Generalized deconditioning -We will need PT eval once respiratory status is stable -We will also request palliative care evaluation for goals of care discussion given multiple recent hospitalizations and overall guarded to poor prognosis   DVT prophylaxis: Heparin Code Status: Full Family Communication: Spoke to patient at bedside Disposition Plan: Depends on clinical outcome  Consultants: Called cardiology  Procedures: None Antimicrobials:  Anti-infectives (From admission, onward)   Start     Dose/Rate Route Frequency Ordered Stop   10/09/19 1900  azithromycin (ZITHROMAX) tablet 500 mg     500 mg Oral Daily 10/09/19 1849 10/14/19 0959       Subjective: Patient seen and examined at bedside.  No overnight fever, vomiting reported.  Still feels very short of breath.  Objective: Vitals:   10/10/19 0500 10/10/19 0727 10/10/19 0730 10/10/19 0755  BP:    132/81  Pulse:    (!) 105  Resp:    20  Temp:    97.7 F (36.5 C)  TempSrc:    Oral  SpO2:  93% 92% 95%  Weight: (!) 139.6 kg  Height:        Intake/Output Summary (Last 24 hours) at 10/10/2019 1109 Last data filed at 10/10/2019 0801 Gross per 24 hour  Intake 1088.72 ml  Output 1175 ml  Net -86.28 ml   Filed  Weights   10/09/19 1130 10/10/19 0500  Weight: (!) 142 kg (!) 139.6 kg    Examination:  General exam: Appears calm and comfortable  Respiratory system: Bilateral decreased breath sounds at bases Cardiovascular system: S1 & S2 heard, Rate controlled Gastrointestinal system: Abdomen is nondistended, soft and nontender. Normal bowel sounds heard. Extremities: No cyanosis, clubbing, edema  Central nervous system: Alert and oriented. No focal neurological deficits. Moving extremities Skin: No rashes, lesions or ulcers Psychiatry: Judgement and insight appear normal. Mood & affect appropriate.     Data Reviewed: I have personally reviewed following labs and imaging studies  CBC: Recent Labs  Lab 10/09/19 1142 10/10/19 0533  WBC 11.5* 10.0  HGB 13.4 11.6*  HCT 40.9 35.4*  MCV 92.5 91.0  PLT 192 [id number]   Basic Metabolic Panel: Recent Labs  Lab 10/09/19 1830 10/09/19 2201 10/10/19 0159 10/10/19 0533 10/10/19 0959  NA 137 137 138 141 141  K 4.9 4.4 3.6 3.5 3.8  CL 101 101 102 103 102  CO2 22 20* 23 24 24   GLUCOSE 456* 420* 278* 203* 240*  BUN 32* 34* 33* 35* 33*  CREATININE 1.59* 1.80* 1.72* 1.69* 1.57*  CALCIUM 9.4 9.0 9.3 9.5 9.2   GFR: Estimated Creatinine Clearance: 67 mL/min (A) (by C-G formula based on SCr of 1.57 mg/dL (H)). Liver Function Tests: Recent Labs  Lab 10/10/19 0533  AST 25  ALT 42  ALKPHOS 50  BILITOT 0.6  PROT 5.9*  ALBUMIN 3.3*   No results for input(s): LIPASE, AMYLASE in the last 168 hours. No results for input(s): AMMONIA in the last 168 hours. Coagulation Profile: No results for input(s): INR, PROTIME in the last 168 hours. Cardiac Enzymes: No results for input(s): CKTOTAL, CKMB, CKMBINDEX, TROPONINI in the last 168 hours. BNP (last 3 results) No results for input(s): PROBNP in the last 8760 hours. HbA1C: No results for input(s): HGBA1C in the last 72 hours. CBG: Recent Labs  Lab 10/10/19 0202 10/10/19 0328 10/10/19 0513  10/10/19 0618 10/10/19 0754  GLUCAP 261* 236* 215* 188* 165*   Lipid Profile: No results for input(s): CHOL, HDL, LDLCALC, TRIG, CHOLHDL, LDLDIRECT in the last 72 hours. Thyroid Function Tests: No results for input(s): TSH, T4TOTAL, FREET4, T3FREE, THYROIDAB in the last 72 hours. Anemia Panel: No results for input(s): VITAMINB12, FOLATE, FERRITIN, TIBC, IRON, RETICCTPCT in the last 72 hours. Sepsis Labs: No results for input(s): PROCALCITON, LATICACIDVEN in the last 168 hours.  Recent Results (from the past 240 hour(s))  Respiratory Panel by RT PCR (Flu A&B, Covid) - Nasopharyngeal Swab     Status: None   Collection Time: 10/09/19  6:43 PM   Specimen: Nasopharyngeal Swab  Result Value Ref Range Status   SARS Coronavirus 2 by RT PCR NEGATIVE NEGATIVE Final    Comment: (NOTE) SARS-CoV-2 target nucleic acids are NOT DETECTED. The SARS-CoV-2 RNA is generally detectable in upper respiratoy specimens during the acute phase of infection. The lowest concentration of SARS-CoV-2 viral copies this assay can detect is 131 copies/mL. A negative result does not preclude SARS-Cov-2 infection and should not be used as the sole basis for treatment or other patient management decisions. A negative result may occur with  improper specimen collection/handling, submission of specimen other than nasopharyngeal  swab, presence of viral mutation(s) within the areas targeted by this assay, and inadequate number of viral copies (<131 copies/mL). A negative result must be combined with clinical observations, patient history, and epidemiological information. The expected result is Negative. Fact Sheet for Patients:  PinkCheek.be Fact Sheet for Healthcare Providers:  GravelBags.it This test is not yet ap proved or cleared by the [place] FDA and  has been authorized for detection and/or diagnosis of SARS-CoV-2 by FDA under an Emergency Use  Authorization (EUA). This EUA will remain  in effect (meaning this test can be used) for the duration of the COVID-19 declaration under Section 564(b)(1) of the Act, 21 U.S.C. section 360bbb-3(b)(1), unless the authorization is terminated or revoked sooner.    Influenza A by PCR NEGATIVE NEGATIVE Final   Influenza B by PCR NEGATIVE NEGATIVE Final    Comment: (NOTE) The Xpert Xpress SARS-CoV-2/FLU/RSV assay is intended as an aid in  the diagnosis of influenza from Nasopharyngeal swab specimens and  should not be used as a sole basis for treatment. Nasal washings and  aspirates are unacceptable for Xpert Xpress SARS-CoV-2/FLU/RSV  testing. Fact Sheet for Patients: PinkCheek.be Fact Sheet for Healthcare Providers: GravelBags.it This test is not yet approved or cleared by the [place] FDA and  has been authorized for detection and/or diagnosis of SARS-CoV-2 by  FDA under an Emergency Use Authorization (EUA). This EUA will remain  in effect (meaning this test can be used) for the duration of the  Covid-19 declaration under Section 564(b)(1) of the Act, 21  U.S.C. section 360bbb-3(b)(1), unless the authorization is  terminated or revoked. Performed at [facility], [place] [address], [place], [place] [address]   MRSA PCR Screening     Status: None   Collection Time: 10/09/19  9:30 PM   Specimen: Nasopharyngeal  Result Value Ref Range Status   MRSA by PCR NEGATIVE NEGATIVE Final    Comment:        The GeneXpert MRSA Assay (FDA approved for NASAL specimens only), is one component of a comprehensive MRSA colonization surveillance program. It is not intended to diagnose MRSA infection nor to guide or monitor treatment for MRSA infections. Performed at [facility], [place] [address], [place],  [address]          Radiology Studies: DG Chest 2 View  Result Date: 10/09/2019 CLINICAL DATA:  Shortness  of breath. EXAM: CHEST - 2 VIEW COMPARISON:  Chest x-ray 09/24/2019. FINDINGS: Mediastinum and hilar structures normal. Heart size normal. Mild lingular subsegmental atelectasis. No pleural effusion or pneumothorax. Degenerative change thoracic spine. IMPRESSION: Mild lingular subsegmental atelectasis, otherwise negative exam. Electronically Signed   By: [place]   On: 10/09/2019 11:56        Scheduled Meds: . albuterol  2.5 mg Nebulization BID  . amLODipine  2.5 mg Oral QHS  . aspirin EC  81 mg Oral QHS  . azithromycin  500 mg Oral Daily  . budesonide (PULMICORT) nebulizer solution  0.25 mg Nebulization BID  . carvedilol  25 mg Oral BID WC  . clopidogrel  75 mg Oral QHS  . docusate sodium  100 mg Oral BID  . fluticasone  1 spray Each Nare Daily  . guaiFENesin  600 mg Oral BID  . heparin  5,000 Units Subcutaneous Q8H  . insulin aspart  0-20 Units Subcutaneous TID WC  . insulin aspart  0-5 Units Subcutaneous QHS  . insulin regular human CONCENTRATED  50 Units Subcutaneous TID  WC  . ipratropium  0.5 mg Nebulization BID  . methylPREDNISolone (SOLU-MEDROL) injection  40 mg Intravenous Q12H  . QUEtiapine  300 mg Oral QHS  . rosuvastatin  20 mg Oral Daily  . sodium chloride flush  3 mL Intravenous Q12H  . [START ON 10/13/2019] Vitamin D (Ergocalciferol)  50,000 Units Oral Q Mon   Continuous Infusions: . sodium chloride            Aline August, MD [facility] 10/10/2019, 11:09 AM
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
## 2019-10-11 DIAGNOSIS — N179 Acute kidney failure, unspecified: Secondary | ICD-10-CM

## 2019-10-11 DIAGNOSIS — N183 Chronic kidney disease, stage 3 unspecified: Secondary | ICD-10-CM

## 2019-10-11 DIAGNOSIS — I5033 Acute on chronic diastolic (congestive) heart failure: Secondary | ICD-10-CM

## 2019-10-11 LAB — BASIC METABOLIC PANEL
Anion gap: 13 (ref 5–15)
BUN: 42 mg/dL — ABNORMAL HIGH (ref 8–23)
CO2: 26 mmol/L (ref 22–32)
Calcium: 9.1 mg/dL (ref 8.9–10.3)
Chloride: 99 mmol/L (ref 98–111)
Creatinine, Ser: 1.42 mg/dL — ABNORMAL HIGH (ref 0.61–1.24)
GFR calc Af Amer: 59 mL/min — ABNORMAL LOW (ref >60)
GFR calc non Af Amer: 51 mL/min — ABNORMAL LOW (ref >60)
Glucose, Bld: 337 mg/dL — ABNORMAL HIGH (ref 70–99)
Potassium: 4.1 mmol/L (ref 3.5–5.1)
Sodium: 138 mmol/L (ref 135–145)

## 2019-10-11 LAB — GLUCOSE, CAPILLARY
Glucose-Capillary: 365 mg/dL — ABNORMAL HIGH (ref 70–99)
Glucose-Capillary: 386 mg/dL — ABNORMAL HIGH (ref 70–99)
Glucose-Capillary: 308 mg/dL — ABNORMAL HIGH (ref 70–99)
Glucose-Capillary: 182 mg/dL — ABNORMAL HIGH (ref 70–99)

## 2019-10-11 LAB — HEMOGLOBIN A1C
Hgb A1c MFr Bld: 10 % — ABNORMAL HIGH (ref 4.8–5.6)
Mean Plasma Glucose: 240.3 mg/dL

## 2019-10-11 MED ORDER — INSULIN REGULAR HUMAN (CONC) 500 UNIT/ML ~~LOC~~ SOPN
60.0000 [IU] | PEN_INJECTOR | Freq: Three times a day (TID) | SUBCUTANEOUS | Status: DC
  Administered 2019-10-11 (×2): 60 [IU] via SUBCUTANEOUS

## 2019-10-11 MED ORDER — ALBUTEROL SULFATE (2.5 MG/3ML) 0.083% IN NEBU: 2.5000 mg | INHALATION_SOLUTION | RESPIRATORY_TRACT | Status: DC | PRN

## 2019-10-11 MED ORDER — ALPRAZOLAM 0.5 MG PO TABS
0.5000 mg | ORAL_TABLET | Freq: Three times a day (TID) | ORAL | Status: AC | PRN
  Administered 2019-10-11: 0.5 mg via ORAL
  Filled 2019-10-11: qty 1

## 2019-10-11 MED ORDER — METHYLPREDNISOLONE SODIUM SUCC 40 MG IJ SOLR
40.0000 mg | Freq: Three times a day (TID) | INTRAMUSCULAR | Status: DC
  Administered 2019-10-11 – 2019-10-12 (×3): 40 mg via INTRAVENOUS
  Filled 2019-10-11 (×3): qty 1

## 2019-10-11 MED ORDER — IPRATROPIUM-ALBUTEROL 0.5-2.5 (3) MG/3ML IN SOLN
3.0000 mL | RESPIRATORY_TRACT | Status: DC
  Administered 2019-10-11 – 2019-10-12 (×9): 3 mL via RESPIRATORY_TRACT
  Filled 2019-10-11 (×9): qty 3

## 2019-10-11 NOTE — Progress Notes (Addendum)
Inpatient Diabetes Program Recommendations  AACE/ADA: New Consensus Statement on Inpatient Glycemic Control (2015)  Target Ranges:  Prepandial:   less than 140 mg/dL      Peak postprandial:   less than 180 mg/dL (1-2 hours)      Critically ill patients:  140 - 180 mg/dL   Results for Reginald Duncan, Reginald Duncan (MRN [id number]) as of 10/11/2019 12:07  Ref. Range 10/10/2019 08:58 10/10/2019 10:08 10/10/2019 11:47 10/10/2019 16:55 10/10/2019 21:07  Glucose-Capillary Latest Ref Range: 70 - 99 mg/dL 160 (H) 208 (H) 269 (H)  11 units NOVOLOG +  50 units U500 Insulin 452 (H)  20 units NOVOLOG +  50 units U500 Insulin  327 (H)  4 units NOVOLOG    Results for Reginald Duncan, Reginald Duncan (MRN [id number]) as of 10/11/2019 12:07  Ref. Range 10/11/2019 08:11  Glucose-Capillary Latest Ref Range: 70 - 99 mg/dL 365 (H)  20 units NOVOLOG      Home DM Meds: U500 insulin- 15 units tid with meals (per patient he was taking double this and still having high blood sugars)      Per pharmacy note: draws up his dose using a regular insulin syringe for U-100. He draws to 15 units, which would result in a U-500 dose of 75 units TID  Current Orders: U500 Insulin- 60 units TID with meals      Novolog Resistant Correction Scale/ SSI (0-20 units) TID AC + HS      Getting Solumedrol 40 mg Q8 hours.    MD- Note Concentrated U500 Insulin increased this AM, however, per MAR, RN did NOT give any U500 insulin this AM.  CBG may be elevated at 12pm due to lack of U500 Insulin this AM with Breakfast.     --Will follow patient during hospitalization--  Wyn Quaker RN, MSN, CDE Diabetes Coordinator Inpatient Glycemic Control Team Team Pager: [phone] (8a-5p)

## 2019-10-11 NOTE — Progress Notes (Signed)
Patient ID: Reginald Duncan, male   DOB: May 07, 1951, 68 y.o.   MRN: [id number]  PROGRESS NOTE    Reginald Duncan  [id number] DOB: [birth year] DOA: 10/09/2019 PCP: [name], PA   Brief Narrative:  68 year old male with history of CAD status post LAD stent in 2014, CVA unspecified, COPD with recent recurrent hospitalization with last discharge on 09/27/2019, chronic hypoxic respiratory failure on supplemental oxygen only intermittently, chronic kidney disease stage III, diabetes mellitus type 2, hypertension, OSA, anxiety with claustrophobia, chronic back pain, depression, GERD, hypertension, hyperlipidemia and acute coronary syndrome in November 2020 with cardiac cath showing multiple vessel coronary artery disease with recommendations to treat medically as per cardiology presented with worsening shortness of breath and almost 20 pound weight gain.  Chest x-ray showed mild lingular atelectasis.  COVID-19 testing was negative.  He was admitted with COPD exacerbation.  Assessment & Plan:  COPD exacerbation Chronic hypoxic respiratory failure requiring supplemental oxygen at home only intermittently -Decrease Solu-Medrol to 40 mg IV every 8 hours for today.  Continue nebs and Pulmicort. -Continue oral Zithromax. -Respiratory PCR and COVID-19 testing negative -Chest x-ray was negative for acute abnormality. -Currently on room air but still coughing intensely intermittently.  Use cough syrup with codeine for cough.  If respiratory status does not improve, will consider CT of the chest and/or pulmonary evaluation -Incentive spirometry.  Acute on chronic diastolic heart failure -Presented with orthopnea and 20 pound weight gain and lower extremity edema -Patient received a dose of Lasix on admission.  Renal function stable.  Continue Lasix 40 mg IV every 12 hours. -Strict input and output.  Daily weights.  Fluid restriction.  Continue Coreg -Cardiology has evaluated patient and signed off.   Outpatient follow-up with cardiology  Early DKA/diabetes mellitus type 2 uncontrolled with hyperglycemia -Presented with anion gap of 19 with hyperglycemia.  Was started on insulin drip.  Subsequently switched to U500 insulin after anion gap closed.  Blood sugars still high.  Increase current insulin to 60 units 3 times daily with meals along with CBGs with SSI.  Diabetes coordinator following.  Carb modified diet. -A1c is 10   Acute kidney injury on chronic renal disease stage III -Creatinine 1.2-1.5 at baseline per records -Presented with creatinine of 1.9.  Creatinine 1.42 today.  Monitor.  Lasix plan as above  Morbid obesity -Outpatient follow-up  Obstructive sleep apnea -Continue CPAP at night  History of coronary disease Mildly elevated troponin -Patient had recent cardiac catheterization in November 2020 which showed multivessel disease and cardiology had recommended medical management.  Continue Coreg and statin. -Cardiology has signed off.  Generalized deconditioning -PT eval. -palliative care evaluation is pending for goals of care discussion given multiple recent hospitalizations and overall guarded to poor prognosis   DVT prophylaxis: Heparin  code Status: Full Family Communication: Spoke to patient at bedside Disposition Plan: Home in 2 to 4 days if clinically improves  Consultants: Cardiology  Procedures: None  Antimicrobials: Zithromax   Subjective: Patient seen and examined at bedside.  He does not feel well, feels anxious.  Still short of breath with even minimal exertion.  Still coughing a lot.  No overnight fever or vomiting.  Objective: Vitals:   10/10/19 1949 10/10/19 2025 10/10/19 2342 10/11/19 0300  BP:  138/75 132/71 133/74  Pulse:  (!) 102 99 95  Resp:  20 19 12   Temp:  97.8 F (36.6 C) 97.8 F (36.6 C) 97.8 F (36.6 C)  TempSrc:  Oral Oral Oral  SpO2: 92% 94% 96% 96%  Weight:      Height:        Intake/Output Summary (Last 24 hours)  at 10/11/2019 0746 Last data filed at 10/11/2019 0600 Gross per 24 hour  Intake 1757.32 ml  Output 3470 ml  Net -1712.68 ml   Filed Weights   10/09/19 1130 10/10/19 0500  Weight: (!) 142 kg (!) 139.6 kg    Examination:  General exam: Appears in mild distress secondary to cough. Respiratory system: Bilateral decreased breath sounds at bases with basilar crackles Cardiovascular system: Rate controlled, S1-S2 heard Gastrointestinal system: Abdomen is morbidly obese, nondistended, soft and nontender. Normal bowel sounds heard. Extremities: No cyanosis, clubbing; bilateral lower extremity edema present Central nervous system: Alert and oriented. No focal neurological deficits. Moving extremities Skin: No rashes, lesions or ulcers Psychiatry: Looks very anxious    Data Reviewed: I have personally reviewed following labs and imaging studies  CBC: Recent Labs  Lab 10/09/19 1142 10/10/19 0533  WBC 11.5* 10.0  HGB 13.4 11.6*  HCT 40.9 35.4*  MCV 92.5 91.0  PLT 192 [id number]   Basic Metabolic Panel: Recent Labs  Lab 10/10/19 0159 10/10/19 0533 10/10/19 0959 10/10/19 1445 10/11/19 0253  NA 138 141 141 139 138  K 3.6 3.5 3.8 4.2 4.1  CL 102 103 102 100 99  CO2 23 24 24 24 26   GLUCOSE 278* 203* 240* 412* 337*  BUN 33* 35* 33* 35* 42*  CREATININE 1.72* 1.69* 1.57* 1.62* 1.42*  CALCIUM 9.3 9.5 9.2 9.2 9.1   GFR: Estimated Creatinine Clearance: 74.1 mL/min (A) (by C-G formula based on SCr of 1.42 mg/dL (H)). Liver Function Tests: Recent Labs  Lab 10/10/19 0533  AST 25  ALT 42  ALKPHOS 50  BILITOT 0.6  PROT 5.9*  ALBUMIN 3.3*   No results for input(s): LIPASE, AMYLASE in the last 168 hours. No results for input(s): AMMONIA in the last 168 hours. Coagulation Profile: No results for input(s): INR, PROTIME in the last 168 hours. Cardiac Enzymes: No results for input(s): CKTOTAL, CKMB, CKMBINDEX, TROPONINI in the last 168 hours. BNP (last 3 results) No results for  input(s): PROBNP in the last 8760 hours. HbA1C: Recent Labs    10/11/19 0253  HGBA1C 10.0*   CBG: Recent Labs  Lab 10/10/19 1008 10/10/19 1147 10/10/19 1655 10/10/19 2107 10/10/19 2157  GLUCAP 208* 269* 452* 327* 335*   Lipid Profile: No results for input(s): CHOL, HDL, LDLCALC, TRIG, CHOLHDL, LDLDIRECT in the last 72 hours. Thyroid Function Tests: No results for input(s): TSH, T4TOTAL, FREET4, T3FREE, THYROIDAB in the last 72 hours. Anemia Panel: No results for input(s): VITAMINB12, FOLATE, FERRITIN, TIBC, IRON, RETICCTPCT in the last 72 hours. Sepsis Labs: No results for input(s): PROCALCITON, LATICACIDVEN in the last 168 hours.  Recent Results (from the past 240 hour(s))  Respiratory Panel by RT PCR (Flu A&B, Covid) - Nasopharyngeal Swab     Status: None   Collection Time: 10/09/19  6:43 PM   Specimen: Nasopharyngeal Swab  Result Value Ref Range Status   SARS Coronavirus 2 by RT PCR NEGATIVE NEGATIVE Final    Comment: (NOTE) SARS-CoV-2 target nucleic acids are NOT DETECTED. The SARS-CoV-2 RNA is generally detectable in upper respiratoy specimens during the acute phase of infection. The lowest concentration of SARS-CoV-2 viral copies this assay can detect is 131 copies/mL. A negative result does not preclude SARS-Cov-2 infection and should not be used as the sole basis for treatment or other patient management decisions. A  negative result may occur with  improper specimen collection/handling, submission of specimen other than nasopharyngeal swab, presence of viral mutation(s) within the areas targeted by this assay, and inadequate number of viral copies (<131 copies/mL). A negative result must be combined with clinical observations, patient history, and epidemiological information. The expected result is Negative. Fact Sheet for Patients:  PinkCheek.be Fact Sheet for Healthcare Providers:  GravelBags.it This  test is not yet ap proved or cleared by the [place] FDA and  has been authorized for detection and/or diagnosis of SARS-CoV-2 by FDA under an Emergency Use Authorization (EUA). This EUA will remain  in effect (meaning this test can be used) for the duration of the COVID-19 declaration under Section 564(b)(1) of the Act, 21 U.S.C. section 360bbb-3(b)(1), unless the authorization is terminated or revoked sooner.    Influenza A by PCR NEGATIVE NEGATIVE Final   Influenza B by PCR NEGATIVE NEGATIVE Final    Comment: (NOTE) The Xpert Xpress SARS-CoV-2/FLU/RSV assay is intended as an aid in  the diagnosis of influenza from Nasopharyngeal swab specimens and  should not be used as a sole basis for treatment. Nasal washings and  aspirates are unacceptable for Xpert Xpress SARS-CoV-2/FLU/RSV  testing. Fact Sheet for Patients: PinkCheek.be Fact Sheet for Healthcare Providers: GravelBags.it This test is not yet approved or cleared by the [place] FDA and  has been authorized for detection and/or diagnosis of SARS-CoV-2 by  FDA under an Emergency Use Authorization (EUA). This EUA will remain  in effect (meaning this test can be used) for the duration of the  Covid-19 declaration under Section 564(b)(1) of the Act, 21  U.S.C. section 360bbb-3(b)(1), unless the authorization is  terminated or revoked. Performed at [facility], [place] [address] [place], [place] [address]   MRSA PCR Screening     Status: None   Collection Time: 10/09/19  9:30 PM   Specimen: Nasopharyngeal  Result Value Ref Range Status   MRSA by PCR NEGATIVE NEGATIVE Final    Comment:        The GeneXpert MRSA Assay (FDA approved for NASAL specimens only), is one component of a comprehensive MRSA colonization surveillance program. It is not intended to diagnose MRSA infection nor to guide or monitor treatment for MRSA infections. Performed at [facility], [place] [address] [place], [place] [address]          Radiology Studies: DG Chest 2 View  Result Date: 10/09/2019 CLINICAL DATA:  Shortness of breath. EXAM: CHEST - 2 VIEW COMPARISON:  Chest x-ray 09/24/2019. FINDINGS: Mediastinum and hilar structures normal. Heart size normal. Mild lingular subsegmental atelectasis. No pleural effusion or pneumothorax. Degenerative change thoracic spine. IMPRESSION: Mild lingular subsegmental atelectasis, otherwise negative exam. Electronically Signed   By: [place]   On: 10/09/2019 11:56        Scheduled Meds: . albuterol  2.5 mg Nebulization BID  . amLODipine  2.5 mg Oral QHS  . aspirin EC  81 mg Oral QHS  . azithromycin  500 mg Oral Daily  . budesonide (PULMICORT) nebulizer solution  0.25 mg Nebulization BID  . carvedilol  25 mg Oral BID WC  . clopidogrel  75 mg Oral QHS  . docusate sodium  100 mg Oral BID  . fluticasone  1 spray Each Nare Daily  . furosemide  40 mg Intravenous Q12H  . guaiFENesin  600 mg Oral BID  . heparin  5,000 Units Subcutaneous Q8H  . insulin aspart  0-20 Units  Subcutaneous TID WC  . insulin aspart  0-5 Units Subcutaneous QHS  . insulin regular human CONCENTRATED  50 Units Subcutaneous TID WC  . ipratropium  0.5 mg Nebulization BID  . methylPREDNISolone (SOLU-MEDROL) injection  60 mg Intravenous Q8H  . QUEtiapine  300 mg Oral QHS  . rosuvastatin  20 mg Oral Daily  . sodium chloride flush  3 mL Intravenous Q12H  . [START ON 10/13/2019] Vitamin D (Ergocalciferol)  50,000 Units Oral Q Mon   Continuous Infusions: . sodium chloride            Aline August, MD [facility] 10/11/2019, 7:46 AM

## 2019-10-12 DIAGNOSIS — I5030 Unspecified diastolic (congestive) heart failure: Secondary | ICD-10-CM

## 2019-10-12 DIAGNOSIS — J9611 Chronic respiratory failure with hypoxia: Secondary | ICD-10-CM

## 2019-10-12 DIAGNOSIS — Z515 Encounter for palliative care: Secondary | ICD-10-CM

## 2019-10-12 DIAGNOSIS — N189 Chronic kidney disease, unspecified: Secondary | ICD-10-CM

## 2019-10-12 LAB — BASIC METABOLIC PANEL
Anion gap: 12 (ref 5–15)
BUN: 44 mg/dL — ABNORMAL HIGH (ref 8–23)
CO2: 29 mmol/L (ref 22–32)
Calcium: 9 mg/dL (ref 8.9–10.3)
Chloride: 102 mmol/L (ref 98–111)
Creatinine, Ser: 1.27 mg/dL — ABNORMAL HIGH (ref 0.61–1.24)
GFR calc Af Amer: >60 mL/min (ref >60)
GFR calc non Af Amer: 58 mL/min — ABNORMAL LOW (ref >60)
Glucose, Bld: 104 mg/dL — ABNORMAL HIGH (ref 70–99)
Potassium: 3.2 mmol/L — ABNORMAL LOW (ref 3.5–5.1)
Sodium: 143 mmol/L (ref 135–145)

## 2019-10-12 LAB — GLUCOSE, CAPILLARY
Glucose-Capillary: 157 mg/dL — ABNORMAL HIGH (ref 70–99)
Glucose-Capillary: 387 mg/dL — ABNORMAL HIGH (ref 70–99)
Glucose-Capillary: 392 mg/dL — ABNORMAL HIGH (ref 70–99)
Glucose-Capillary: 270 mg/dL — ABNORMAL HIGH (ref 70–99)

## 2019-10-12 LAB — MAGNESIUM: Magnesium: 2 mg/dL (ref 1.7–2.4)

## 2019-10-12 MED ORDER — IPRATROPIUM-ALBUTEROL 0.5-2.5 (3) MG/3ML IN SOLN
3.0000 mL | Freq: Three times a day (TID) | RESPIRATORY_TRACT | Status: DC
  Administered 2019-10-13 – 2019-10-17 (×14): 3 mL via RESPIRATORY_TRACT
  Filled 2019-10-12 (×13): qty 3

## 2019-10-12 MED ORDER — POTASSIUM CHLORIDE CRYS ER 20 MEQ PO TBCR
40.0000 meq | EXTENDED_RELEASE_TABLET | ORAL | Status: AC
  Administered 2019-10-12 (×2): 40 meq via ORAL
  Filled 2019-10-12 (×2): qty 2

## 2019-10-12 MED ORDER — INSULIN REGULAR HUMAN (CONC) 500 UNIT/ML ~~LOC~~ SOPN
70.0000 [IU] | PEN_INJECTOR | Freq: Three times a day (TID) | SUBCUTANEOUS | Status: DC
  Administered 2019-10-12 – 2019-10-13 (×3): 70 [IU] via SUBCUTANEOUS
  Filled 2019-10-12: qty 3

## 2019-10-12 MED ORDER — METHYLPREDNISOLONE SODIUM SUCC 40 MG IJ SOLR
40.0000 mg | Freq: Two times a day (BID) | INTRAMUSCULAR | Status: DC
  Administered 2019-10-12 – 2019-10-14 (×4): 40 mg via INTRAVENOUS
  Filled 2019-10-12 (×4): qty 1

## 2019-10-12 MED ORDER — MORPHINE SULFATE (CONCENTRATE) 10 MG/0.5ML PO SOLN
5.0000 mg | ORAL | Status: DC | PRN
  Administered 2019-10-13: 5 mg via ORAL
  Filled 2019-10-12: qty 0.5

## 2019-10-12 MED ORDER — CLONAZEPAM 0.5 MG PO TABS
0.5000 mg | ORAL_TABLET | Freq: Every day | ORAL | Status: DC
  Administered 2019-10-12: 0.5 mg via ORAL
  Filled 2019-10-12: qty 1

## 2019-10-12 MED ORDER — GUAIFENESIN-CODEINE 100-10 MG/5ML PO SOLN
10.0000 mL | Freq: Four times a day (QID) | ORAL | Status: DC | PRN
  Administered 2019-10-12: 10 mL via ORAL
  Filled 2019-10-12: qty 10

## 2019-10-12 MED ORDER — CLONAZEPAM 0.5 MG PO TABS: 0.5000 mg | ORAL_TABLET | Freq: Two times a day (BID) | ORAL | Status: DC

## 2019-10-12 MED ORDER — SENNOSIDES-DOCUSATE SODIUM 8.6-50 MG PO TABS: 1.0000 | ORAL_TABLET | Freq: Every evening | ORAL | Status: DC | PRN

## 2019-10-12 NOTE — Plan of Care (Signed)
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
## 2019-10-12 NOTE — Progress Notes (Signed)
Patient ID: RAJVEER HANLIN, male   DOB: [birth year], 68 y.o.   MRN: [id number]  PROGRESS NOTE    DRU OPPEGARD  [id number] DOB: 07/09/1951 DOA: 10/09/2019 PCP: [name], PA   Brief Narrative:  68 year old male with history of CAD status post LAD stent in 2014, CVA unspecified, COPD with recent recurrent hospitalization with last discharge on 09/27/2019, chronic hypoxic respiratory failure on supplemental oxygen only intermittently, chronic kidney disease stage III, diabetes mellitus type 2, hypertension, OSA, anxiety with claustrophobia, chronic back pain, depression, GERD, hypertension, hyperlipidemia and acute coronary syndrome in November 2020 with cardiac cath showing multiple vessel coronary artery disease with recommendations to treat medically as per cardiology presented with worsening shortness of breath and almost 20 pound weight gain.  Chest x-ray showed mild lingular atelectasis.  COVID-19 testing was negative.  He was admitted with COPD exacerbation.  Assessment & Plan:  COPD exacerbation Chronic hypoxic respiratory failure requiring supplemental oxygen at home only intermittently -Decrease Solu-Medrol to 40 mg IV every 12 hours for today.  Continue nebs and Pulmicort. -Continue oral Zithromax. -Respiratory PCR and COVID-19 testing negative -Chest x-ray was negative for acute abnormality. -Currently 2 L nasal cannula but still coughing intensely intermittently.  Use cough syrup with codeine for cough.  If respiratory status does not improve, will consider CT of the chest and/or pulmonary evaluation -Incentive spirometry.  Acute on chronic diastolic heart failure -Presented with orthopnea and 20 pound weight gain and lower extremity edema -Patient received a dose of Lasix on admission.  Renal function stable.  Continue Lasix 40 mg IV every 12 hours. -Strict input and output.  Daily weights.  Fluid restriction.  Continue Coreg -Cardiology has evaluated patient and signed off.   Outpatient follow-up with cardiology  Early DKA/diabetes mellitus type 2 uncontrolled with hyperglycemia -Presented with anion gap of 19 with hyperglycemia.  Was started on insulin drip.  Subsequently switched to U500 insulin after anion gap closed.  Blood sugars still high.  Increase current insulin to 70 units 3 times daily with meals along with CBGs with SSI.  Diabetes coordinator following.  Carb modified diet. -A1c is 10   Acute kidney injury on chronic renal disease stage III -Creatinine 1.2-1.5 at baseline per records -Presented with creatinine of 1.9.  Creatinine 1.27 today.  Monitor.  Lasix plan as above  Morbid obesity -Outpatient follow-up  Obstructive sleep apnea -Continue CPAP at night  History of coronary disease Mildly elevated troponin -Patient had recent cardiac catheterization in November 2020 which showed multivessel disease and cardiology had recommended medical management.  Continue Coreg and statin. -Cardiology has signed off.  Generalized deconditioning -PT eval. -palliative care evaluation is pending for goals of care discussion given multiple recent hospitalizations and overall guarded to poor prognosis   DVT prophylaxis: Heparin  code Status: Full Family Communication: Spoke to patient at bedside Disposition Plan: Home in 2 to 4 days if clinically improves  Consultants: Cardiology  Procedures: None  Antimicrobials: Zithromax   Subjective: Patient seen and examined at bedside.  Still complains of intermittent excessive coughing with shortness of breath with minimal exertion.  No overnight fever or vomiting. Objective: Vitals:   10/11/19 1945 10/11/19 2256 10/12/19 0005 10/12/19 0305  BP: (!) 139/97 (!) 131/93  128/84  Pulse: 99 97 90   Resp: (!) 22 (!) 24 (!) 22 16  Temp: 98.5 F (36.9 C) 98.2 F (36.8 C)  97.8 F (36.6 C)  TempSrc: Oral Oral  Oral  SpO2: 97% 95% 94%  91%  Weight:      Height:        Intake/Output Summary (Last 24  hours) at 10/12/2019 0734 Last data filed at 10/12/2019 0255 Gross per 24 hour  Intake 620 ml  Output 4800 ml  Net -4180 ml   Filed Weights   10/09/19 1130 10/10/19 0500  Weight: (!) 142 kg (!) 139.6 kg    Examination:  General exam: No acute distress. Respiratory system: Bilateral decreased breath sounds at bases with bibasilar crackles.  No wheezing.  Intermittent tachypnea  cardiovascular system: S1-S2 heard, rate controlled Gastrointestinal system: Abdomen is morbidly obese, nondistended, soft and nontender. Normal bowel sounds heard. Extremities: No cyanosis; bilateral lower extremity edema present Central nervous system: Awake and alert. No focal neurological deficits. Moving extremities Skin: No rashes, lesions or ulcers Psychiatry: Anxious looking    Data Reviewed: I have personally reviewed following labs and imaging studies  CBC: Recent Labs  Lab 10/09/19 1142 10/10/19 0533  WBC 11.5* 10.0  HGB 13.4 11.6*  HCT 40.9 35.4*  MCV 92.5 91.0  PLT 192 [id number]   Basic Metabolic Panel: Recent Labs  Lab 10/10/19 0533 10/10/19 0959 10/10/19 1445 10/11/19 0253 10/12/19 0432  NA 141 141 139 138 143  K 3.5 3.8 4.2 4.1 3.2*  CL 103 102 100 99 102  CO2 24 24 24 26 29   GLUCOSE 203* 240* 412* 337* 104*  BUN 35* 33* 35* 42* 44*  CREATININE 1.69* 1.57* 1.62* 1.42* 1.27*  CALCIUM 9.5 9.2 9.2 9.1 9.0  MG  --   --   --   --  2.0   GFR: Estimated Creatinine Clearance: 82.9 mL/min (A) (by C-G formula based on SCr of 1.27 mg/dL (H)). Liver Function Tests: Recent Labs  Lab 10/10/19 0533  AST 25  ALT 42  ALKPHOS 50  BILITOT 0.6  PROT 5.9*  ALBUMIN 3.3*   No results for input(s): LIPASE, AMYLASE in the last 168 hours. No results for input(s): AMMONIA in the last 168 hours. Coagulation Profile: No results for input(s): INR, PROTIME in the last 168 hours. Cardiac Enzymes: No results for input(s): CKTOTAL, CKMB, CKMBINDEX, TROPONINI in the last 168 hours. BNP (last 3  results) No results for input(s): PROBNP in the last 8760 hours. HbA1C: Recent Labs    10/11/19 0253  HGBA1C 10.0*   CBG: Recent Labs  Lab 10/10/19 2157 10/11/19 0811 10/11/19 1241 10/11/19 1630 10/11/19 2057  GLUCAP 335* 365* 386* 308* 182*   Lipid Profile: No results for input(s): CHOL, HDL, LDLCALC, TRIG, CHOLHDL, LDLDIRECT in the last 72 hours. Thyroid Function Tests: No results for input(s): TSH, T4TOTAL, FREET4, T3FREE, THYROIDAB in the last 72 hours. Anemia Panel: No results for input(s): VITAMINB12, FOLATE, FERRITIN, TIBC, IRON, RETICCTPCT in the last 72 hours. Sepsis Labs: No results for input(s): PROCALCITON, LATICACIDVEN in the last 168 hours.  Recent Results (from the past 240 hour(s))  Respiratory Panel by RT PCR (Flu A&B, Covid) - Nasopharyngeal Swab     Status: None   Collection Time: 10/09/19  6:43 PM   Specimen: Nasopharyngeal Swab  Result Value Ref Range Status   SARS Coronavirus 2 by RT PCR NEGATIVE NEGATIVE Final    Comment: (NOTE) SARS-CoV-2 target nucleic acids are NOT DETECTED. The SARS-CoV-2 RNA is generally detectable in upper respiratoy specimens during the acute phase of infection. The lowest concentration of SARS-CoV-2 viral copies this assay can detect is 131 copies/mL. A negative result does not preclude SARS-Cov-2 infection and should not be used  as the sole basis for treatment or other patient management decisions. A negative result may occur with  improper specimen collection/handling, submission of specimen other than nasopharyngeal swab, presence of viral mutation(s) within the areas targeted by this assay, and inadequate number of viral copies (<131 copies/mL). A negative result must be combined with clinical observations, patient history, and epidemiological information. The expected result is Negative. Fact Sheet for Patients:  PinkCheek.be Fact Sheet for Healthcare Providers:    GravelBags.it This test is not yet ap proved or cleared by the [place] FDA and  has been authorized for detection and/or diagnosis of SARS-CoV-2 by FDA under an Emergency Use Authorization (EUA). This EUA will remain  in effect (meaning this test can be used) for the duration of the COVID-19 declaration under Section 564(b)(1) of the Act, 21 U.S.C. section 360bbb-3(b)(1), unless the authorization is terminated or revoked sooner.    Influenza A by PCR NEGATIVE NEGATIVE Final   Influenza B by PCR NEGATIVE NEGATIVE Final    Comment: (NOTE) The Xpert Xpress SARS-CoV-2/FLU/RSV assay is intended as an aid in  the diagnosis of influenza from Nasopharyngeal swab specimens and  should not be used as a sole basis for treatment. Nasal washings and  aspirates are unacceptable for Xpert Xpress SARS-CoV-2/FLU/RSV  testing. Fact Sheet for Patients: PinkCheek.be Fact Sheet for Healthcare Providers: GravelBags.it This test is not yet approved or cleared by the [place] FDA and  has been authorized for detection and/or diagnosis of SARS-CoV-2 by  FDA under an Emergency Use Authorization (EUA). This EUA will remain  in effect (meaning this test can be used) for the duration of the  Covid-19 declaration under Section 564(b)(1) of the Act, 21  U.S.C. section 360bbb-3(b)(1), unless the authorization is  terminated or revoked. Performed at [facility], [place] [address], [place], [place] [address]   MRSA PCR Screening     Status: None   Collection Time: 10/09/19  9:30 PM   Specimen: Nasopharyngeal  Result Value Ref Range Status   MRSA by PCR NEGATIVE NEGATIVE Final    Comment:        The GeneXpert MRSA Assay (FDA approved for NASAL specimens only), is one component of a comprehensive MRSA colonization surveillance program. It is not intended to diagnose MRSA infection nor to guide or monitor  treatment for MRSA infections. Performed at [facility], [place] [address], [place], [place] [address]          Radiology Studies: No results found.      Scheduled Meds: . amLODipine  2.5 mg Oral QHS  . aspirin EC  81 mg Oral QHS  . azithromycin  500 mg Oral Daily  . budesonide (PULMICORT) nebulizer solution  0.25 mg Nebulization BID  . carvedilol  25 mg Oral BID WC  . clopidogrel  75 mg Oral QHS  . docusate sodium  100 mg Oral BID  . fluticasone  1 spray Each Nare Daily  . furosemide  40 mg Intravenous Q12H  . guaiFENesin  600 mg Oral BID  . heparin  5,000 Units Subcutaneous Q8H  . insulin aspart  0-20 Units Subcutaneous TID WC  . insulin aspart  0-5 Units Subcutaneous QHS  . insulin regular human CONCENTRATED  60 Units Subcutaneous TID WC  . ipratropium-albuterol  3 mL Nebulization Q4H  . methylPREDNISolone (SOLU-MEDROL) injection  40 mg Intravenous Q8H  . QUEtiapine  300 mg Oral QHS  . rosuvastatin  20 mg Oral Daily  . sodium  chloride flush  3 mL Intravenous Q12H  . [START ON 10/13/2019] Vitamin D (Ergocalciferol)  50,000 Units Oral Q Mon   Continuous Infusions: . sodium chloride            Aline August, MD [facility] 10/12/2019, 7:34 AM

## 2019-10-13 DIAGNOSIS — F419 Anxiety disorder, unspecified: Secondary | ICD-10-CM

## 2019-10-13 DIAGNOSIS — E0810 Diabetes mellitus due to underlying condition with ketoacidosis without coma: Secondary | ICD-10-CM

## 2019-10-13 LAB — BASIC METABOLIC PANEL
Anion gap: 12 (ref 5–15)
BUN: 45 mg/dL — ABNORMAL HIGH (ref 8–23)
CO2: 32 mmol/L (ref 22–32)
Calcium: 9.1 mg/dL (ref 8.9–10.3)
Chloride: 99 mmol/L (ref 98–111)
Creatinine, Ser: 1.45 mg/dL — ABNORMAL HIGH (ref 0.61–1.24)
GFR calc Af Amer: 57 mL/min — ABNORMAL LOW (ref >60)
GFR calc non Af Amer: 49 mL/min — ABNORMAL LOW (ref >60)
Glucose, Bld: 65 mg/dL — ABNORMAL LOW (ref 70–99)
Potassium: 3.6 mmol/L (ref 3.5–5.1)
Sodium: 143 mmol/L (ref 135–145)

## 2019-10-13 LAB — GLUCOSE, CAPILLARY
Glucose-Capillary: 119 mg/dL — ABNORMAL HIGH (ref 70–99)
Glucose-Capillary: 181 mg/dL — ABNORMAL HIGH (ref 70–99)
Glucose-Capillary: 183 mg/dL — ABNORMAL HIGH (ref 70–99)
Glucose-Capillary: 88 mg/dL (ref 70–99)

## 2019-10-13 MED ORDER — ALPRAZOLAM 0.5 MG PO TABS
0.5000 mg | ORAL_TABLET | Freq: Three times a day (TID) | ORAL | Status: DC | PRN
  Administered 2019-10-13 – 2019-10-16 (×7): 0.5 mg via ORAL
  Filled 2019-10-13 (×7): qty 1

## 2019-10-13 MED ORDER — BISACODYL 5 MG PO TBEC: 10.0000 mg | DELAYED_RELEASE_TABLET | Freq: Every day | ORAL | Status: DC | PRN

## 2019-10-13 MED ORDER — POLYETHYLENE GLYCOL 3350 17 G PO PACK: 17.0000 g | PACK | Freq: Every day | ORAL | Status: DC | PRN

## 2019-10-13 MED ORDER — SENNOSIDES-DOCUSATE SODIUM 8.6-50 MG PO TABS
1.0000 | ORAL_TABLET | Freq: Two times a day (BID) | ORAL | Status: DC
  Administered 2019-10-13 – 2019-10-17 (×5): 1 via ORAL
  Filled 2019-10-13 (×7): qty 1

## 2019-10-13 MED ORDER — INSULIN REGULAR HUMAN (CONC) 500 UNIT/ML ~~LOC~~ SOPN
75.0000 [IU] | PEN_INJECTOR | Freq: Three times a day (TID) | SUBCUTANEOUS | Status: DC
  Administered 2019-10-13 (×2): 75 [IU] via SUBCUTANEOUS
  Filled 2019-10-13: qty 3

## 2019-10-13 NOTE — Progress Notes (Signed)
Chaplain visited the patient to follow-up on their request for an AD.  The patient stated they wanted to updated their DNR information on their living will.  The patient stated they wanted to remove the DNR on their current living will.  The chaplain answered questions and insured the patient had the living will paperwork.  The chaplain is available to follow-up if needed.  Brion Aliment Chaplain Resident For questions concerning this note please contact me by pager [phone]

## 2019-10-13 NOTE — Progress Notes (Signed)
Patient ID: YONIC GUYMON, male   DOB: 07-Jul-1951, 68 y.o.   MRN: [id number]  PROGRESS NOTE    Reginald Duncan  [id number] DOB: [birth year] DOA: 10/09/2019 PCP: [name], PA   Brief Narrative:  68 year old male with history of CAD status post LAD stent in 2014, CVA unspecified, COPD with recent recurrent hospitalization with last discharge on 09/27/2019, chronic hypoxic respiratory failure on supplemental oxygen only intermittently, chronic kidney disease stage III, diabetes mellitus type 2, hypertension, OSA, anxiety with claustrophobia, chronic back pain, depression, GERD, hypertension, hyperlipidemia and acute coronary syndrome in November 2020 with cardiac cath showing multiple vessel coronary artery disease with recommendations to treat medically as per cardiology presented with worsening shortness of breath and almost 20 pound weight gain.  Chest x-ray showed mild lingular atelectasis.  COVID-19 testing was negative.  He was admitted with COPD exacerbation.  Assessment & Plan:  COPD exacerbation Chronic hypoxic respiratory failure requiring supplemental oxygen at home only intermittently -Continue current dose of Solu-Medrol and probably switch to prednisone tomorrow.  Continue nebs and Pulmicort. -Continue oral Zithromax. -Respiratory PCR and COVID-19 testing negative -Chest x-ray was negative for acute abnormality. -Currently on room air but still has significant cough.  Use cough syrup with codeine for cough.  If respiratory status does not improve, will consider CT of the chest and/or pulmonary evaluation -Incentive spirometry.  Acute on chronic diastolic heart failure -Presented with orthopnea and 20 pound weight gain and lower extremity edema -Patient received a dose of Lasix on admission.  Renal function stable.  Continue Lasix 40 mg IV every 12 hours. -Strict input and output.  Daily weights.  Fluid restriction.  Negative balance of 7804.3 cc since admission.  Continue  Coreg -Cardiology has evaluated patient and signed off.  Outpatient follow-up with cardiology  Early DKA/diabetes mellitus type 2 uncontrolled with hyperglycemia -Presented with anion gap of 19 with hyperglycemia.  Was started on insulin drip.  Subsequently switched to U500 insulin after anion gap closed.  Blood sugars still high.  Increase current insulin to 75 units 3 times daily with meals along with CBGs with SSI.  Diabetes coordinator following.  Carb modified diet. -A1c is 10   Acute kidney injury on chronic renal disease stage III -Creatinine 1.2-1.5 at baseline per records -Presented with creatinine of 1.9.  Creatinine 1.45 today.  Monitor.  Lasix plan as above  Morbid obesity -Outpatient follow-up  Obstructive sleep apnea -Patient cannot tolerate CPAP because of claustrophobia.  History of coronary disease Mildly elevated troponin -Patient had recent cardiac catheterization in November 2020 which showed multivessel disease and cardiology had recommended medical management.  Continue Coreg and statin. -Cardiology has signed off.  Generalized deconditioning -PT eval. -palliative care evaluation appreciated.  Patient has been changed to partial code for now.  DVT prophylaxis: Heparin  code Status: Partial  family Communication: Spoke to patient at bedside Disposition Plan: Home in 1-3 days if clinically improves  Consultants: Cardiology  Procedures: None  Antimicrobials: Zithromax   Subjective: Patient seen and examined at bedside.  Still feels short of breath with even minimal exertion with excessive coughing.  No overnight fever, nausea or vomiting.  Does not feel ready to go home yet.  Intermittently feels anxious. Objective: Vitals:   10/12/19 1948 10/12/19 2309 10/13/19 0431 10/13/19 0432  BP:  (!) 121/95 130/86   Pulse:      Resp:      Temp:  (!) 97.4 F (36.3 C) 98.1 F (36.7 C)   TempSrc:  Oral Oral   SpO2: 98%     Weight:    136 kg  Height:         Intake/Output Summary (Last 24 hours) at 10/13/2019 0719 Last data filed at 10/13/2019 0604 Gross per 24 hour  Intake 393 ml  Output 2200 ml  Net -1807 ml   Filed Weights   10/09/19 1130 10/10/19 0500 10/13/19 0432  Weight: (!) 142 kg (!) 139.6 kg 136 kg    Examination:  General exam: No distress. Respiratory system: Bilateral decreased breath sounds at bases with basilar crackles.   Cardiovascular system: Rate controlled, S1-S2 heard Gastrointestinal system: Abdomen is morbidly obese, nondistended, soft and nontender. Normal bowel sounds heard. Extremities: Bilateral lower extremity edema present.  No cyanosis  Central nervous system: Awake and alert. No focal neurological deficits. Moving extremities Skin: No rashes or ulcers Psychiatry: Looks anxious    Data Reviewed: I have personally reviewed following labs and imaging studies  CBC: Recent Labs  Lab 10/09/19 1142 10/10/19 0533  WBC 11.5* 10.0  HGB 13.4 11.6*  HCT 40.9 35.4*  MCV 92.5 91.0  PLT 192 [id number]   Basic Metabolic Panel: Recent Labs  Lab 10/10/19 0959 10/10/19 1445 10/11/19 0253 10/12/19 0432 10/13/19 0259  NA 141 139 138 143 143  K 3.8 4.2 4.1 3.2* 3.6  CL 102 100 99 102 99  CO2 24 24 26 29  32  GLUCOSE 240* 412* 337* 104* 65*  BUN 33* 35* 42* 44* 45*  CREATININE 1.57* 1.62* 1.42* 1.27* 1.45*  CALCIUM 9.2 9.2 9.1 9.0 9.1  MG  --   --   --  2.0  --    GFR: Estimated Creatinine Clearance: 71.5 mL/min (A) (by C-G formula based on SCr of 1.45 mg/dL (H)). Liver Function Tests: Recent Labs  Lab 10/10/19 0533  AST 25  ALT 42  ALKPHOS 50  BILITOT 0.6  PROT 5.9*  ALBUMIN 3.3*   No results for input(s): LIPASE, AMYLASE in the last 168 hours. No results for input(s): AMMONIA in the last 168 hours. Coagulation Profile: No results for input(s): INR, PROTIME in the last 168 hours. Cardiac Enzymes: No results for input(s): CKTOTAL, CKMB, CKMBINDEX, TROPONINI in the last 168 hours. BNP (last  3 results) No results for input(s): PROBNP in the last 8760 hours. HbA1C: Recent Labs    10/11/19 0253  HGBA1C 10.0*   CBG: Recent Labs  Lab 10/11/19 2057 10/12/19 0814 10/12/19 1349 10/12/19 1717 10/12/19 2049  GLUCAP 182* 157* 387* 392* 270*   Lipid Profile: No results for input(s): CHOL, HDL, LDLCALC, TRIG, CHOLHDL, LDLDIRECT in the last 72 hours. Thyroid Function Tests: No results for input(s): TSH, T4TOTAL, FREET4, T3FREE, THYROIDAB in the last 72 hours. Anemia Panel: No results for input(s): VITAMINB12, FOLATE, FERRITIN, TIBC, IRON, RETICCTPCT in the last 72 hours. Sepsis Labs: No results for input(s): PROCALCITON, LATICACIDVEN in the last 168 hours.  Recent Results (from the past 240 hour(s))  Respiratory Panel by RT PCR (Flu A&B, Covid) - Nasopharyngeal Swab     Status: None   Collection Time: 10/09/19  6:43 PM   Specimen: Nasopharyngeal Swab  Result Value Ref Range Status   SARS Coronavirus 2 by RT PCR NEGATIVE NEGATIVE Final    Comment: (NOTE) SARS-CoV-2 target nucleic acids are NOT DETECTED. The SARS-CoV-2 RNA is generally detectable in upper respiratoy specimens during the acute phase of infection. The lowest concentration of SARS-CoV-2 viral copies this assay can detect is 131 copies/mL. A negative result does not  preclude SARS-Cov-2 infection and should not be used as the sole basis for treatment or other patient management decisions. A negative result may occur with  improper specimen collection/handling, submission of specimen other than nasopharyngeal swab, presence of viral mutation(s) within the areas targeted by this assay, and inadequate number of viral copies (<131 copies/mL). A negative result must be combined with clinical observations, patient history, and epidemiological information. The expected result is Negative. Fact Sheet for Patients:  PinkCheek.be Fact Sheet for Healthcare Providers:   GravelBags.it This test is not yet ap proved or cleared by the [place] FDA and  has been authorized for detection and/or diagnosis of SARS-CoV-2 by FDA under an Emergency Use Authorization (EUA). This EUA will remain  in effect (meaning this test can be used) for the duration of the COVID-19 declaration under Section 564(b)(1) of the Act, 21 U.S.C. section 360bbb-3(b)(1), unless the authorization is terminated or revoked sooner.    Influenza A by PCR NEGATIVE NEGATIVE Final   Influenza B by PCR NEGATIVE NEGATIVE Final    Comment: (NOTE) The Xpert Xpress SARS-CoV-2/FLU/RSV assay is intended as an aid in  the diagnosis of influenza from Nasopharyngeal swab specimens and  should not be used as a sole basis for treatment. Nasal washings and  aspirates are unacceptable for Xpert Xpress SARS-CoV-2/FLU/RSV  testing. Fact Sheet for Patients: PinkCheek.be Fact Sheet for Healthcare Providers: GravelBags.it This test is not yet approved or cleared by the [place] FDA and  has been authorized for detection and/or diagnosis of SARS-CoV-2 by  FDA under an Emergency Use Authorization (EUA). This EUA will remain  in effect (meaning this test can be used) for the duration of the  Covid-19 declaration under Section 564(b)(1) of the Act, 21  U.S.C. section 360bbb-3(b)(1), unless the authorization is  terminated or revoked. Performed at [facility], [place] [address] [place], [place] [address]   MRSA PCR Screening     Status: None   Collection Time: 10/09/19  9:30 PM   Specimen: Nasopharyngeal  Result Value Ref Range Status   MRSA by PCR NEGATIVE NEGATIVE Final    Comment:        The GeneXpert MRSA Assay (FDA approved for NASAL specimens only), is one component of a comprehensive MRSA colonization surveillance program. It is not intended to diagnose MRSA infection nor to guide or monitor  treatment for MRSA infections. Performed at [facility], [place] [address] [place], [place] [address]          Radiology Studies: No results found.      Scheduled Meds: . amLODipine  2.5 mg Oral QHS  . aspirin EC  81 mg Oral QHS  . azithromycin  500 mg Oral Daily  . budesonide (PULMICORT) nebulizer solution  0.25 mg Nebulization BID  . carvedilol  25 mg Oral BID WC  . clonazePAM  0.5 mg Oral QHS  . clopidogrel  75 mg Oral QHS  . docusate sodium  100 mg Oral BID  . fluticasone  1 spray Each Nare Daily  . furosemide  40 mg Intravenous Q12H  . guaiFENesin  600 mg Oral BID  . heparin  5,000 Units Subcutaneous Q8H  . insulin aspart  0-20 Units Subcutaneous TID WC  . insulin aspart  0-5 Units Subcutaneous QHS  . insulin regular human CONCENTRATED  70 Units Subcutaneous TID WC  . ipratropium-albuterol  3 mL Nebulization TID  . methylPREDNISolone (SOLU-MEDROL) injection  40 mg Intravenous Q12H  . QUEtiapine  300 mg Oral QHS  . rosuvastatin  20 mg Oral Daily  . sodium chloride flush  3 mL Intravenous Q12H  . Vitamin D (Ergocalciferol)  50,000 Units Oral Q Mon   Continuous Infusions: . sodium chloride            Aline August, MD [facility] 10/13/2019, 7:19 AM

## 2019-10-13 NOTE — Care Management Important Message (Signed)
Important Message  Patient Details  Name: Reginald Duncan MRN: [id number] Date of Birth: [birth year]   Medicare Important Message Given:  Yes     Orbie Pyo 10/13/2019, 12:48 PM

## 2019-10-13 NOTE — Consult Note (Signed)
   [facility] CM Inpatient Consult   10/13/2019  ZACKERY CAO 04/02/1951 [id number]    Call received from Inpatient transition of care (TOC) CM for referral received regarding this patient.   Patient checked for 36% extreme high risk score for unplanned readmission with 4 hospitalizations and a 30 day readmission in the past 6 months for this [facility] Medicare patient.  Brief chart review shows that patient was recently outreached by [facility] RN CM (care management coordinator) for care coordination and has confirmed being active with [facility].  TOC CM made aware that patient is currently active with an external care management and suggested to follow-up with [facility] of current health care issues.   For questions and additional information, please call:  Jameal Razzano A. Chancy Smigiel, BSN, RN-BC [facility] Liaison Cell: [phone]

## 2019-10-14 LAB — MAGNESIUM: Magnesium: 2.2 mg/dL (ref 1.7–2.4)

## 2019-10-14 LAB — BASIC METABOLIC PANEL
Anion gap: 13 (ref 5–15)
BUN: 45 mg/dL — ABNORMAL HIGH (ref 8–23)
CO2: 30 mmol/L (ref 22–32)
Calcium: 8.9 mg/dL (ref 8.9–10.3)
Chloride: 99 mmol/L (ref 98–111)
Creatinine, Ser: 1.45 mg/dL — ABNORMAL HIGH (ref 0.61–1.24)
GFR calc Af Amer: 57 mL/min — ABNORMAL LOW (ref >60)
GFR calc non Af Amer: 49 mL/min — ABNORMAL LOW (ref >60)
Glucose, Bld: 136 mg/dL — ABNORMAL HIGH (ref 70–99)
Potassium: 3.5 mmol/L (ref 3.5–5.1)
Sodium: 142 mmol/L (ref 135–145)

## 2019-10-14 LAB — GLUCOSE, CAPILLARY
Glucose-Capillary: 141 mg/dL — ABNORMAL HIGH (ref 70–99)
Glucose-Capillary: 384 mg/dL — ABNORMAL HIGH (ref 70–99)
Glucose-Capillary: 347 mg/dL — ABNORMAL HIGH (ref 70–99)
Glucose-Capillary: 248 mg/dL — ABNORMAL HIGH (ref 70–99)

## 2019-10-14 MED ORDER — METHYLPREDNISOLONE SODIUM SUCC 40 MG IJ SOLR
40.0000 mg | INTRAMUSCULAR | Status: DC
  Administered 2019-10-15: 40 mg via INTRAVENOUS
  Filled 2019-10-14: qty 1

## 2019-10-14 MED ORDER — INSULIN REGULAR HUMAN (CONC) 500 UNIT/ML ~~LOC~~ SOPN
60.0000 [IU] | PEN_INJECTOR | Freq: Three times a day (TID) | SUBCUTANEOUS | Status: DC
  Administered 2019-10-14 – 2019-10-17 (×9): 60 [IU] via SUBCUTANEOUS
  Filled 2019-10-14 (×2): qty 3

## 2019-10-14 MED ORDER — SODIUM CHLORIDE 3 % IN NEBU
4.0000 mL | INHALATION_SOLUTION | RESPIRATORY_TRACT | Status: AC | PRN
  Administered 2019-10-14: 4 mL via RESPIRATORY_TRACT
  Filled 2019-10-14 (×2): qty 4

## 2019-10-14 NOTE — Progress Notes (Signed)
Palliative care progress note  Reason for consult: Goals of care and symptom management in light of advanced COPD and CAD  I met today with Reginald Duncan.  He reports having "rough" night after trial of klonopin rather than xanax.  States that it was changed back to xanax this AM by Dr. Starla Link.  I had a long conversation with Reginald Duncan about his anxiety, shortness of breath, panic attacks, social situation, and how these all interact and feed off of each other.  He is intermittently tearful and states multiple times he "didn't used to be this way."  We discussed how he feels that this has worsened (particularly with severity and frequency of panic attacks) in the last 3 months, but is also a problem he has been dealing with in some ways for most of his adult life.  We talked about medications in anxiety and panic attacks, and he reports that Xanax has been what brings him the most relief.  Discussed with him my recommendation that he be started on more than just benzos for anxiety, and we went through the names of multiple SSRIs which he reports trialing in the past with either unsatisfactory results or unwanted side effects.  - Much of the visit today was therapeutic listening and discussion of non-medication ways to deal with his anxiety. - Overall, I recommended to Reginald Duncan that he find follow-up for his panic attacks once he is discharged from the hospital.  He also would benefit from outpatient social services follow-up for his social situation.  I believe that his dyspnea would be more controlled if his anxiety is more controlled which is also dependent on his poor social situation.  Recommend establishing with psychiatric services to help determine best medication regimen as he has intolerance or no effect of every medication (SSRIs) I suggested. - Shortness of breath- he reports no effect from low dose morphine.  Could continue trial here in the hospital, but will need to monitor usage closely as  kidney function is impaired and may cause concern if this is a medication he is using round the clock (this is not the case at this time).  If this becomes the case, could consider continuing opioids but transition to either oxycodone or dilaudid.  Total time: 50 minutes Greater than 50%  of this time was spent counseling and coordinating care related to the above assessment and plan.  Micheline Rough, MD [place] [facility] [phone]

## 2019-10-14 NOTE — TOC Progression Note (Signed)
Transition of Care [facility]) - Progression Note    Patient Details  Name: Reginald Duncan MRN: [id number] Date of Birth: December 05, 1950  [facility]) CM/SW Contact  Sharin Mons, RN Phone Number: 10/14/2019, 2:05 PM  Clinical Narrative:    Readmitted with COPD exacerbation.From home alone states. States PTA cared for self. Recently discharge from [facility], Nov.3rd.  Pt is enrolled/active with [facility]( out patient palliative care). Per [facility] they have had problems going out to see pt. States pt makes it very difficult and will not allow them to come out to see him.  Pt states PCP listed in epic, Verlin Grills, will no longer provide services to him 2/2 missed appointments, 30 day notice given. NCM has scheduled hospital f/u appointment/ establish primary care with [facility], noted on AVS and made [facility] aware.  Pt with high readmission score. Pt could benefit from home health services. Pt agreeable to home health service. States he can't receive services until after 12/30. States the building he lives in is being exterminated and no one from the outside will be allowed in. Choice offered to pt for home health services. Pt without preference. Referral made with [facility] and accepted, SOC to begin after 12/30. MD aware F2F needed for HHRN,SW and NA.  Pt states brother to provide transportation to home when discharged.   Expected Discharge Plan: [place] Barriers to Discharge: Continued Medical Work up  Expected Discharge Plan and Services Expected Discharge Plan: [place]           Date [place]: 10/15/19 Time [place]: [id number] Representative spoke with at [place]: [place] @ Home    Social Determinants of Health ([place]) Interventions    Readmission Risk Interventions Readmission Risk Prevention Plan 09/27/2019  Transportation Screening Complete   PCP or Specialist Appt within 3-5 Days Complete  HRI or [place] Complete  Social Work Consult for [place] Planning/Counseling Complete  Palliative Care Screening Not Applicable  Medication Review Press photographer) Complete  Some recent data might be hidden

## 2019-10-14 NOTE — Progress Notes (Signed)
Patient ID: Reginald Duncan, male   DOB: [birth year], 68 y.o.   MRN: [id number]  PROGRESS NOTE    Reginald Duncan  [id number] DOB: Apr 13, 1951 DOA: 10/09/2019 PCP: [name], PA   Brief Narrative:  68 year old male with history of CAD status post LAD stent in 2014, CVA unspecified, COPD with recent recurrent hospitalization with last discharge on 09/27/2019, chronic hypoxic respiratory failure on supplemental oxygen only intermittently, chronic kidney disease stage III, diabetes mellitus type 2, hypertension, OSA, anxiety with claustrophobia, chronic back pain, depression, GERD, hypertension, hyperlipidemia and acute coronary syndrome in November 2020 with cardiac cath showing multiple vessel coronary artery disease with recommendations to treat medically as per cardiology presented with worsening shortness of breath and almost 20 pound weight gain.  Chest x-ray showed mild lingular atelectasis.  COVID-19 testing was negative.  He was admitted with COPD exacerbation.  Assessment & Plan:  COPD exacerbation Chronic hypoxic respiratory failure requiring supplemental oxygen at home only intermittently -Decrease telemetry to 40 mg IV daily and probably switch to prednisone tomorrow.  Continue nebs and Pulmicort. -Respiratory PCR and COVID-19 testing negative -Chest x-ray was negative for acute abnormality. -Currently on room air but still has significant cough.  Use cough syrup with codeine for cough.  If respiratory status does not improve, will consider CT of the chest and/or pulmonary evaluation -Incentive spirometry.  Acute on chronic diastolic heart failure -Presented with orthopnea and 20 pound weight gain and lower extremity edema -Continue Lasix 40 mg IV every 12 hours for 1 more day and probably switch to oral Lasix tomorrow. -Strict input and output.  Daily weights.  Fluid restriction.  Negative balance of 9249.3 cc since admission.  Continue Coreg -Cardiology has evaluated patient and  signed off.  Outpatient follow-up with cardiology  Early DKA/diabetes mellitus type 2 uncontrolled with hyperglycemia -Presented with anion gap of 19 with hyperglycemia.  Was started on insulin drip.  Subsequently switched to U500 insulin after anion gap closed.   -Blood sugars have much improved and now intermittently on the lower side.  Decrease current insulin to 60 units 3 times daily with meals along with CBGs with SSI.  Diabetes coordinator following.  Carb modified diet. -A1c is 10   Acute kidney injury on chronic renal disease stage III -Creatinine 1.2-1.5 at baseline per records -Presented with creatinine of 1.9.  Creatinine 1.45 today.  Monitor.  Lasix plan as above  Morbid obesity -Outpatient follow-up  Obstructive sleep apnea -Patient cannot tolerate CPAP because of claustrophobia.  Generalized anxiety/panic attacks/probable depression -Patient is very anxious and could not tolerate the switch to Klonopin.  Switched back to Xanax.  He will need outpatient evaluation by psychiatry and appropriate antidepressant since patient states that he cannot tolerate some of them.  His respiratory symptoms are also triggered by his episodes of panic attacks.  History of coronary disease Mildly elevated troponin -Patient had recent cardiac catheterization in November 2020 which showed multivessel disease and cardiology had recommended medical management.  Continue Coreg and statin. -Cardiology has signed off.  Generalized deconditioning -PT eval. -palliative care evaluation appreciated.  Patient has been changed to partial code for now.  DVT prophylaxis: Heparin  code Status: Partial  family Communication: Spoke to patient at bedside Disposition Plan: Home in 1-2 days if clinically improves  Consultants: Cardiology  Procedures: None  Antimicrobials: Zithromax   Subjective: Patient seen and examined at bedside.  Still complains of intermittent anxiety and panic attacks.  Still  intermittently very short of breath  with cough.  No overnight fever or vomiting. Objective: Vitals:   10/14/19 0600 10/14/19 0800 10/14/19 0811 10/14/19 0832  BP: 127/88  (!) 119/106   Pulse: 82 88 78 83  Resp: 16 14 16 17   Temp:   98.1 F (36.7 C)   TempSrc:   Oral   SpO2: 97% 96% 94% 95%  Weight:      Height:        Intake/Output Summary (Last 24 hours) at 10/14/2019 0853 Last data filed at 10/14/2019 0548 Gross per 24 hour  Intake 240 ml  Output 1925 ml  Net -1685 ml   Filed Weights   10/10/19 0500 10/13/19 0432 10/14/19 0319  Weight: (!) 139.6 kg 136 kg (!) 137.4 kg    Examination:  General exam: No acute distress.  Looks chronically ill. Respiratory system: Bilateral decreased breath sounds at bases with bibasilar crackles.  No wheezing  cardiovascular system: S1-S2 heard, rate controlled Gastrointestinal system: Abdomen is morbidly obese, nondistended, nontender.  Bowel sounds heard.   Extremities: Lower extremity edema present.  No cyanosis  Central nervous system: Awake and answers questions.  No focal neurological deficits. Moving extremities Skin: No ulcers or rashes. Psychiatry: Extremely anxious looking    Data Reviewed: I have personally reviewed following labs and imaging studies  CBC: Recent Labs  Lab 10/09/19 1142 10/10/19 0533  WBC 11.5* 10.0  HGB 13.4 11.6*  HCT 40.9 35.4*  MCV 92.5 91.0  PLT 192 [id number]   Basic Metabolic Panel: Recent Labs  Lab 10/10/19 1445 10/11/19 0253 10/12/19 0432 10/13/19 0259 10/14/19 0211  NA 139 138 143 143 142  K 4.2 4.1 3.2* 3.6 3.5  CL 100 99 102 99 99  CO2 24 26 29  32 30  GLUCOSE 412* 337* 104* 65* 136*  BUN 35* 42* 44* 45* 45*  CREATININE 1.62* 1.42* 1.27* 1.45* 1.45*  CALCIUM 9.2 9.1 9.0 9.1 8.9  MG  --   --  2.0  --  2.2   GFR: Estimated Creatinine Clearance: 72 mL/min (A) (by C-G formula based on SCr of 1.45 mg/dL (H)). Liver Function Tests: Recent Labs  Lab 10/10/19 0533  AST 25  ALT 42    ALKPHOS 50  BILITOT 0.6  PROT 5.9*  ALBUMIN 3.3*   No results for input(s): LIPASE, AMYLASE in the last 168 hours. No results for input(s): AMMONIA in the last 168 hours. Coagulation Profile: No results for input(s): INR, PROTIME in the last 168 hours. Cardiac Enzymes: No results for input(s): CKTOTAL, CKMB, CKMBINDEX, TROPONINI in the last 168 hours. BNP (last 3 results) No results for input(s): PROBNP in the last 8760 hours. HbA1C: No results for input(s): HGBA1C in the last 72 hours. CBG: Recent Labs  Lab 10/13/19 0743 10/13/19 1201 10/13/19 1607 10/13/19 2101 10/14/19 0812  GLUCAP 119* 181* 183* 88 141*   Lipid Profile: No results for input(s): CHOL, HDL, LDLCALC, TRIG, CHOLHDL, LDLDIRECT in the last 72 hours. Thyroid Function Tests: No results for input(s): TSH, T4TOTAL, FREET4, T3FREE, THYROIDAB in the last 72 hours. Anemia Panel: No results for input(s): VITAMINB12, FOLATE, FERRITIN, TIBC, IRON, RETICCTPCT in the last 72 hours. Sepsis Labs: No results for input(s): PROCALCITON, LATICACIDVEN in the last 168 hours.  Recent Results (from the past 240 hour(s))  Respiratory Panel by RT PCR (Flu A&B, Covid) - Nasopharyngeal Swab     Status: None   Collection Time: 10/09/19  6:43 PM   Specimen: Nasopharyngeal Swab  Result Value Ref Range Status   SARS  Coronavirus 2 by RT PCR NEGATIVE NEGATIVE Final    Comment: (NOTE) SARS-CoV-2 target nucleic acids are NOT DETECTED. The SARS-CoV-2 RNA is generally detectable in upper respiratoy specimens during the acute phase of infection. The lowest concentration of SARS-CoV-2 viral copies this assay can detect is 131 copies/mL. A negative result does not preclude SARS-Cov-2 infection and should not be used as the sole basis for treatment or other patient management decisions. A negative result may occur with  improper specimen collection/handling, submission of specimen other than nasopharyngeal swab, presence of viral  mutation(s) within the areas targeted by this assay, and inadequate number of viral copies (<131 copies/mL). A negative result must be combined with clinical observations, patient history, and epidemiological information. The expected result is Negative. Fact Sheet for Patients:  PinkCheek.be Fact Sheet for Healthcare Providers:  GravelBags.it This test is not yet ap proved or cleared by the [place] FDA and  has been authorized for detection and/or diagnosis of SARS-CoV-2 by FDA under an Emergency Use Authorization (EUA). This EUA will remain  in effect (meaning this test can be used) for the duration of the COVID-19 declaration under Section 564(b)(1) of the Act, 21 U.S.C. section 360bbb-3(b)(1), unless the authorization is terminated or revoked sooner.    Influenza A by PCR NEGATIVE NEGATIVE Final   Influenza B by PCR NEGATIVE NEGATIVE Final    Comment: (NOTE) The Xpert Xpress SARS-CoV-2/FLU/RSV assay is intended as an aid in  the diagnosis of influenza from Nasopharyngeal swab specimens and  should not be used as a sole basis for treatment. Nasal washings and  aspirates are unacceptable for Xpert Xpress SARS-CoV-2/FLU/RSV  testing. Fact Sheet for Patients: PinkCheek.be Fact Sheet for Healthcare Providers: GravelBags.it This test is not yet approved or cleared by the [place] FDA and  has been authorized for detection and/or diagnosis of SARS-CoV-2 by  FDA under an Emergency Use Authorization (EUA). This EUA will remain  in effect (meaning this test can be used) for the duration of the  Covid-19 declaration under Section 564(b)(1) of the Act, 21  U.S.C. section 360bbb-3(b)(1), unless the authorization is  terminated or revoked. Performed at [facility], [place] [address], [place], [place] [address]   MRSA PCR Screening     Status: None   Collection  Time: 10/09/19  9:30 PM   Specimen: Nasopharyngeal  Result Value Ref Range Status   MRSA by PCR NEGATIVE NEGATIVE Final    Comment:        The GeneXpert MRSA Assay (FDA approved for NASAL specimens only), is one component of a comprehensive MRSA colonization surveillance program. It is not intended to diagnose MRSA infection nor to guide or monitor treatment for MRSA infections. Performed at [facility], [place] [address], [place], [place] [address]          Radiology Studies: No results found.      Scheduled Meds: . amLODipine  2.5 mg Oral QHS  . aspirin EC  81 mg Oral QHS  . budesonide (PULMICORT) nebulizer solution  0.25 mg Nebulization BID  . carvedilol  25 mg Oral BID WC  . clopidogrel  75 mg Oral QHS  . docusate sodium  100 mg Oral BID  . fluticasone  1 spray Each Nare Daily  . furosemide  40 mg Intravenous Q12H  . guaiFENesin  600 mg Oral BID  . heparin  5,000 Units Subcutaneous Q8H  . insulin aspart  0-20 Units Subcutaneous TID WC  . insulin aspart  0-5 Units Subcutaneous QHS  . insulin regular human CONCENTRATED  75 Units Subcutaneous TID WC  . ipratropium-albuterol  3 mL Nebulization TID  . methylPREDNISolone (SOLU-MEDROL) injection  40 mg Intravenous Q12H  . QUEtiapine  300 mg Oral QHS  . rosuvastatin  20 mg Oral Daily  . senna-docusate  1 tablet Oral BID  . sodium chloride flush  3 mL Intravenous Q12H  . Vitamin D (Ergocalciferol)  50,000 Units Oral Q Mon   Continuous Infusions: . sodium chloride            Aline August, MD [facility] 10/14/2019, 8:53 AM

## 2019-10-14 NOTE — Progress Notes (Signed)
Pt told RN that he has a grocery bag containing money laying on his bed.  Pt stated there was "six thousand dollars" in the bag.  RN advised pt to let staff put his money in a safe during his stay. Pt refused stating he did not want [place] to have to count his money and put it in a safe.  Pt educated about importance of safe for his belongings. Pt continued to refuse.

## 2019-10-15 LAB — BASIC METABOLIC PANEL
Anion gap: 11 (ref 5–15)
BUN: 51 mg/dL — ABNORMAL HIGH (ref 8–23)
CO2: 32 mmol/L (ref 22–32)
Calcium: 9 mg/dL (ref 8.9–10.3)
Chloride: 96 mmol/L — ABNORMAL LOW (ref 98–111)
Creatinine, Ser: 1.53 mg/dL — ABNORMAL HIGH (ref 0.61–1.24)
GFR calc Af Amer: 54 mL/min — ABNORMAL LOW (ref >60)
GFR calc non Af Amer: 46 mL/min — ABNORMAL LOW (ref >60)
Glucose, Bld: 128 mg/dL — ABNORMAL HIGH (ref 70–99)
Potassium: 3.9 mmol/L (ref 3.5–5.1)
Sodium: 139 mmol/L (ref 135–145)

## 2019-10-15 LAB — GLUCOSE, CAPILLARY
Glucose-Capillary: 154 mg/dL — ABNORMAL HIGH (ref 70–99)
Glucose-Capillary: 133 mg/dL — ABNORMAL HIGH (ref 70–99)
Glucose-Capillary: 42 mg/dL — CL (ref 70–99)
Glucose-Capillary: 92 mg/dL (ref 70–99)
Glucose-Capillary: 228 mg/dL — ABNORMAL HIGH (ref 70–99)

## 2019-10-15 LAB — MAGNESIUM: Magnesium: 2.2 mg/dL (ref 1.7–2.4)

## 2019-10-15 MED ORDER — METHYLPREDNISOLONE SODIUM SUCC 40 MG IJ SOLR
40.0000 mg | INTRAMUSCULAR | Status: AC
  Administered 2019-10-16: 40 mg via INTRAVENOUS
  Filled 2019-10-15: qty 1

## 2019-10-15 MED ORDER — FUROSEMIDE 10 MG/ML IJ SOLN
40.0000 mg | Freq: Every day | INTRAMUSCULAR | Status: AC
  Administered 2019-10-16: 40 mg via INTRAVENOUS
  Filled 2019-10-15: qty 4

## 2019-10-15 NOTE — Progress Notes (Signed)
[facility] PROGRESS NOTE  Reginald Duncan [id number] DOB: August 26, 1951 DOA: 10/09/2019 PCP: [name], PA  Brief summary  68 year old male with history of CAD status post LAD stent in 2014, CVA unspecified, COPD with recent recurrent hospitalization with last discharge on 09/27/2019, chronic hypoxic respiratory failure on supplemental oxygen only intermittently, chronic kidney disease stage III, diabetes mellitus type 2, hypertension, OSA, anxiety with claustrophobia, chronic back pain, depression, GERD, hypertension, hyperlipidemia and acute coronary syndrome in November 2020 with cardiac cath showing multiple vessel coronary artery disease with recommendations to treat medically as per cardiology presented with worsening shortness of breath and almost 20 pound weight gain.  Chest x-ray showed mild lingular atelectasis.  COVID-19 testing was negative.  He was admitted with COPD exacerbation  Assessment/Plan:  COPD exacerbation. Chronic hypoxic respiratory failure requiring supplemental oxygen at home only intermittently -Respiratory PCR and COVID-19 testing negative -Chest x-ray was negative for acute abnormality. -on solumedrol 40 mg IV daily. Transition to short term PO prednisone on 12-24. Continue nebs and Pulmicort.  Acute on chronic diastolic heart failure -Presented with orthopnea and 20 pound weight gain and lower extremity edema -Continue Lasix 40 mg IV every 12 hours for 1 more day and probably switch to oral Lasix on 12/24. -Strict input and output.  Daily weights.  Fluid restriction.  Negative balance of 9249.3 cc since admission.  Continue Coreg -Cardiology has evaluated patient and signed off.  Outpatient follow-up with cardiology  Early DKA/diabetes mellitus type 2 uncontrolled with hyperglycemia -Presented with anion gap of 19 with hyperglycemia.  Was started on insulin drip.  Subsequently switched to U500 insulin after anion gap closed.   -currently insulin to 60  units 3 times daily with meals along with CBGs with SSI.  Diabetes coordinator following.  Carb modified diet. -A1c is 10  Acute kidney injury on chronic renal disease stage III -Creatinine 1.2-1.5 at baseline per records -Presented with creatinine of 1.9.  Creatinine 1.45 today.  Monitor.  Lasix plan as above  Morbid obesity -Outpatient follow-up  Obstructive sleep apnea -Patient cannot tolerate CPAP because of claustrophobia.  Generalized anxiety/panic attacks/probable depression -Patient is very anxious and could not tolerate the switch to Klonopin.  Switched back to Xanax.  He will need outpatient evaluation by psychiatry and appropriate antidepressant since patient states that he cannot tolerate some of them.  His respiratory symptoms are also triggered by his episodes of panic attacks.  History of coronary disease. Mildly elevated troponin -Patient had recent cardiac catheterization in November 2020 which showed multivessel disease and cardiology had recommended medical management.  Continue Coreg and statin. -Cardiology has signed off.  Generalized deconditioning -obtain PT eval. -palliative care evaluation appreciated.  Patient has been changed to partial code for now.  Code Status: full Family Communication: d/w patient, RN (indicate person spoken with, relationship, and if by phone, the number) Disposition Plan: home in 24-48 hrs    Consultants:  Cardiology   Procedures:  none  Antibiotics: Anti-infectives (From admission, onward)   Start     Dose/Rate Route Frequency Ordered Stop   10/09/19 1900  azithromycin (ZITHROMAX) tablet 500 mg     500 mg Oral Daily 10/09/19 1849 10/13/19 0804        (indicate start date, and stop date if known)  HPI/Subjective: Mild wheezing. + anxiety. No acute chest pains.   Objective: Vitals:   10/15/19 0747 10/15/19 0839  BP:  130/82  Pulse: 76 76  Resp:  16  Temp:  97.6 F (  36.4 C)  SpO2: 96% 95%     Intake/Output Summary (Last 24 hours) at 10/15/2019 1111 Last data filed at 10/15/2019 0243 Gross per 24 hour  Intake 240 ml  Output 3600 ml  Net -3360 ml   Filed Weights   10/13/19 0432 10/14/19 0319 10/15/19 0555  Weight: 136 kg (!) 137.4 kg 129.5 kg    Exam:   General:  No distress   Cardiovascular: s1,s2 rrr  Respiratory: few wheezing   Abdomen: soft, nt   Musculoskeletal: no leg edema    Data Reviewed: Basic Metabolic Panel: Recent Labs  Lab 10/11/19 0253 10/12/19 0432 10/13/19 0259 10/14/19 0211 10/15/19 0222  NA 138 143 143 142 139  K 4.1 3.2* 3.6 3.5 3.9  CL 99 102 99 99 96*  CO2 26 29 32 30 32  GLUCOSE 337* 104* 65* 136* 128*  BUN 42* 44* 45* 45* 51*  CREATININE 1.42* 1.27* 1.45* 1.45* 1.53*  CALCIUM 9.1 9.0 9.1 8.9 9.0  MG  --  2.0  --  2.2 2.2   Liver Function Tests: Recent Labs  Lab 10/10/19 0533  AST 25  ALT 42  ALKPHOS 50  BILITOT 0.6  PROT 5.9*  ALBUMIN 3.3*   No results for input(s): LIPASE, AMYLASE in the last 168 hours. No results for input(s): AMMONIA in the last 168 hours. CBC: Recent Labs  Lab 10/09/19 1142 10/10/19 0533  WBC 11.5* 10.0  HGB 13.4 11.6*  HCT 40.9 35.4*  MCV 92.5 91.0  PLT 192 171   Cardiac Enzymes: No results for input(s): CKTOTAL, CKMB, CKMBINDEX, TROPONINI in the last 168 hours. BNP (last 3 results) Recent Labs    09/24/19 2159 09/25/19 0440 10/09/19 1142  BNP 26.2 37.9 58.2    ProBNP (last 3 results) No results for input(s): PROBNP in the last 8760 hours.  CBG: Recent Labs  Lab 10/14/19 0812 10/14/19 1154 10/14/19 1649 10/14/19 2138 10/15/19 0831  GLUCAP 141* 384* 347* 248* 154*    Recent Results (from the past 240 hour(s))  Respiratory Panel by RT PCR (Flu A&B, Covid) - Nasopharyngeal Swab     Status: None   Collection Time: 10/09/19  6:43 PM   Specimen: Nasopharyngeal Swab  Result Value Ref Range Status   SARS Coronavirus 2 by RT PCR NEGATIVE NEGATIVE Final    Comment:  (NOTE) SARS-CoV-2 target nucleic acids are NOT DETECTED. The SARS-CoV-2 RNA is generally detectable in upper respiratoy specimens during the acute phase of infection. The lowest concentration of SARS-CoV-2 viral copies this assay can detect is 131 copies/mL. A negative result does not preclude SARS-Cov-2 infection and should not be used as the sole basis for treatment or other patient management decisions. A negative result may occur with  improper specimen collection/handling, submission of specimen other than nasopharyngeal swab, presence of viral mutation(s) within the areas targeted by this assay, and inadequate number of viral copies (<131 copies/mL). A negative result must be combined with clinical observations, patient history, and epidemiological information. The expected result is Negative. Fact Sheet for Patients:  PinkCheek.be Fact Sheet for Healthcare Providers:  GravelBags.it This test is not yet ap proved or cleared by the [place] FDA and  has been authorized for detection and/or diagnosis of SARS-CoV-2 by FDA under an Emergency Use Authorization (EUA). This EUA will remain  in effect (meaning this test can be used) for the duration of the COVID-19 declaration under Section 564(b)(1) of the Act, 21 U.S.C. section 360bbb-3(b)(1), unless the authorization is terminated or  revoked sooner.    Influenza A by PCR NEGATIVE NEGATIVE Final   Influenza B by PCR NEGATIVE NEGATIVE Final    Comment: (NOTE) The Xpert Xpress SARS-CoV-2/FLU/RSV assay is intended as an aid in  the diagnosis of influenza from Nasopharyngeal swab specimens and  should not be used as a sole basis for treatment. Nasal washings and  aspirates are unacceptable for Xpert Xpress SARS-CoV-2/FLU/RSV  testing. Fact Sheet for Patients: PinkCheek.be Fact Sheet for Healthcare  Providers: GravelBags.it This test is not yet approved or cleared by the [place] FDA and  has been authorized for detection and/or diagnosis of SARS-CoV-2 by  FDA under an Emergency Use Authorization (EUA). This EUA will remain  in effect (meaning this test can be used) for the duration of the  Covid-19 declaration under Section 564(b)(1) of the Act, 21  U.S.C. section 360bbb-3(b)(1), unless the authorization is  terminated or revoked. Performed at [facility], [place] [address], [place], [place] [address]   MRSA PCR Screening     Status: None   Collection Time: 10/09/19  9:30 PM   Specimen: Nasopharyngeal  Result Value Ref Range Status   MRSA by PCR NEGATIVE NEGATIVE Final    Comment:        The GeneXpert MRSA Assay (FDA approved for NASAL specimens only), is one component of a comprehensive MRSA colonization surveillance program. It is not intended to diagnose MRSA infection nor to guide or monitor treatment for MRSA infections. Performed at [facility], [place] [address], [place], [place] [address]      Studies: No results found.  Scheduled Meds: . amLODipine  2.5 mg Oral QHS  . aspirin EC  81 mg Oral QHS  . budesonide (PULMICORT) nebulizer solution  0.25 mg Nebulization BID  . carvedilol  25 mg Oral BID WC  . clopidogrel  75 mg Oral QHS  . docusate sodium  100 mg Oral BID  . fluticasone  1 spray Each Nare Daily  . furosemide  40 mg Intravenous Q12H  . guaiFENesin  600 mg Oral BID  . heparin  5,000 Units Subcutaneous Q8H  . insulin aspart  0-20 Units Subcutaneous TID WC  . insulin aspart  0-5 Units Subcutaneous QHS  . insulin regular human CONCENTRATED  60 Units Subcutaneous TID WC  . ipratropium-albuterol  3 mL Nebulization TID  . methylPREDNISolone (SOLU-MEDROL) injection  40 mg Intravenous Q24H  . QUEtiapine  300 mg Oral QHS  . rosuvastatin  20 mg Oral Daily  . senna-docusate  1 tablet Oral BID  . sodium chloride  flush  3 mL Intravenous Q12H  . Vitamin D (Ergocalciferol)  50,000 Units Oral Q Mon   Continuous Infusions: . sodium chloride      Active Problems:   COPD exacerbation (HCC)   DKA (diabetic ketoacidoses) ([place])   Heart failure with preserved ejection fraction [facility])   Palliative care encounter    Time spent: >25 minutes 3    Kinnie Feil  [facility] Pager [phone]. If 7PM-7AM, please contact night-coverage at [email or web address], password [facility] 10/15/2019, 11:11 AM  LOS: 5 days

## 2019-10-15 NOTE — Plan of Care (Signed)

## 2019-10-16 LAB — GLUCOSE, CAPILLARY
Glucose-Capillary: 315 mg/dL — ABNORMAL HIGH (ref 70–99)
Glucose-Capillary: 471 mg/dL — ABNORMAL HIGH (ref 70–99)
Glucose-Capillary: 426 mg/dL — ABNORMAL HIGH (ref 70–99)
Glucose-Capillary: 292 mg/dL — ABNORMAL HIGH (ref 70–99)
Glucose-Capillary: 169 mg/dL — ABNORMAL HIGH (ref 70–99)

## 2019-10-16 LAB — MAGNESIUM: Magnesium: 2.2 mg/dL (ref 1.7–2.4)

## 2019-10-16 MED ORDER — PREDNISONE 20 MG PO TABS
50.0000 mg | ORAL_TABLET | Freq: Every day | ORAL | Status: DC
  Administered 2019-10-17: 50 mg via ORAL
  Filled 2019-10-16: qty 2

## 2019-10-16 MED ORDER — ALPRAZOLAM 0.5 MG PO TABS
0.5000 mg | ORAL_TABLET | Freq: Three times a day (TID) | ORAL | Status: DC
  Administered 2019-10-16 – 2019-10-17 (×4): 0.5 mg via ORAL
  Filled 2019-10-16 (×4): qty 1

## 2019-10-16 MED ORDER — FUROSEMIDE 20 MG PO TABS
20.0000 mg | ORAL_TABLET | Freq: Two times a day (BID) | ORAL | Status: DC
  Administered 2019-10-16 – 2019-10-17 (×2): 20 mg via ORAL
  Filled 2019-10-16 (×2): qty 1

## 2019-10-16 NOTE — Progress Notes (Signed)
[facility] PROGRESS NOTE  Reginald Duncan [id number] DOB: 06/26/1951 DOA: 10/09/2019 PCP: [name], PA  Brief summary  68 year old male with history of CAD status post LAD stent in 2014, CVA unspecified, COPD with recent recurrent hospitalization with last discharge on 09/27/2019, chronic hypoxic respiratory failure on supplemental oxygen only intermittently, chronic kidney disease stage III, diabetes mellitus type 2, hypertension, OSA, anxiety with claustrophobia, chronic back pain, depression, GERD, hypertension, hyperlipidemia and acute coronary syndrome in November 2020 with cardiac cath showing multiple vessel coronary artery disease with recommendations to treat medically as per cardiology presented with worsening shortness of breath and almost 20 pound weight gain.  Chest x-ray showed mild lingular atelectasis.  COVID-19 testing was negative.  He was admitted with COPD exacerbation  10/16/19 today: pt reports still feeling SOB and incredibly anxious, he does have some insight that the SOB and anxiety can cause a feedback loop wherein both conditions will exacerbate the other. He questions why antibiotics don't seem to "fix it" and he was educated on NH of COPD. Discussed breathing techniques to ease anxiety. On physical exam, he is wheezing and coarse breath sounds.   Assessment/Plan:  COPD exacerbation. Chronic hypoxic respiratory failure requiring supplemental oxygen at home only intermittently -Respiratory PCR and COVID-19 testing negative -Chest x-ray was negative for acute abnormality. -on solumedrol 40 mg IV daily. Solumedrol given this AM, orders are in to transition to PO prednisone and plan to taper on discharge. Continue nebs and Pulmicort. -Strong consideration for pulmonary consult outpatient, may be candidate for chronic steroids? Anxiety/SOB are closely tied as well, pt has been on multiple psychiatric medications in the past, I've placed him on TID Xanax here today    Acute on chronic diastolic heart failure -Presented with orthopnea and 20 pound weight gain and lower extremity edema -Continue Lasix 40 mg IV every 12 hours for 1 more day and probably switch to oral Lasix on 12/24. -Strict input and output.  Daily weights.  Fluid restriction.  Negative balance of 9249.3 cc since admission.  Continue Coreg -Cardiology has evaluated patient and signed off.  Outpatient follow-up with cardiology  Early DKA/diabetes mellitus type 2 uncontrolled with hyperglycemia -Presented with anion gap of 19 with hyperglycemia.  Was started on insulin drip.  Subsequently switched to U500 insulin after anion gap closed.   -currently insulin to 60 units 3 times daily with meals along with CBGs with SSI.  Diabetes coordinator following.  Carb modified diet. -A1c is 10  Acute kidney injury on chronic renal disease stage III -Creatinine 1.2-1.5 at baseline per records -Presented with creatinine of 1.9.  Creatinine 1.45 today.  Monitor.  Lasix plan as above  Morbid obesity -Outpatient follow-up  Obstructive sleep apnea -Patient cannot tolerate CPAP because of claustrophobia.  Generalized anxiety/panic attacks/probable depression -Anxiety/SOB are closely tied as well, pt has been on multiple psychiatric medications in the past, I've placed him on TID Xanax here today on schedule rather than as PRN   History of coronary disease. Mildly elevated troponin -Patient had recent cardiac catheterization in November 2020 which showed multivessel disease and cardiology had recommended medical management.  Continue Coreg and statin. -Cardiology has signed off.  Generalized deconditioning -obtain PT eval. -palliative care evaluation appreciated.  Patient has been changed to partial code for now.  Code Status: full Family Communication: d/w patient, RN (indicate person spoken with, relationship, and if by phone, the number) Disposition Plan: home once ambulating and ok on  PO meds. Hopefully tomorrow 10/17/19  Consultants:  Cardiology   Procedures:  none  Antibiotics: Anti-infectives (From admission, onward)   Start     Dose/Rate Route Frequency Ordered Stop   10/09/19 1900  azithromycin (ZITHROMAX) tablet 500 mg     500 mg Oral Daily 10/09/19 1849 10/13/19 0804       (indicate start date, and stop date if known)  HPI/Subjective: Mild wheezing. + anxiety. No acute chest pains.   Objective: Vitals:   10/16/19 0824 10/16/19 1052  BP: 129/87   Pulse: 75   Resp: 16   Temp: 97.6 F (36.4 C)   SpO2: 95% 99%    Intake/Output Summary (Last 24 hours) at 10/16/2019 1110 Last data filed at 10/16/2019 0245 Gross per 24 hour  Intake 717 ml  Output 700 ml  Net 17 ml   Filed Weights   10/14/19 0319 10/15/19 0555 10/16/19 0500  Weight: (!) 137.4 kg 129.5 kg 135.6 kg    Exam:   General:  No distress   Cardiovascular: s1,s2 rrr  Respiratory: few wheezing   Abdomen: soft, nt   Musculoskeletal: no leg edema    Data Reviewed: Basic Metabolic Panel: Recent Labs  Lab 10/11/19 0253 10/12/19 0432 10/13/19 0259 10/14/19 0211 10/15/19 0222 10/16/19 0323  NA 138 143 143 142 139  --   K 4.1 3.2* 3.6 3.5 3.9  --   CL 99 102 99 99 96*  --   CO2 26 29 32 30 32  --   GLUCOSE 337* 104* 65* 136* 128*  --   BUN 42* 44* 45* 45* 51*  --   CREATININE 1.42* 1.27* 1.45* 1.45* 1.53*  --   CALCIUM 9.1 9.0 9.1 8.9 9.0  --   MG  --  2.0  --  2.2 2.2 2.2   Liver Function Tests: Recent Labs  Lab 10/10/19 0533  AST 25  ALT 42  ALKPHOS 50  BILITOT 0.6  PROT 5.9*  ALBUMIN 3.3*   No results for input(s): LIPASE, AMYLASE in the last 168 hours. No results for input(s): AMMONIA in the last 168 hours. CBC: Recent Labs  Lab 10/09/19 1142 10/10/19 0533  WBC 11.5* 10.0  HGB 13.4 11.6*  HCT 40.9 35.4*  MCV 92.5 91.0  PLT 192 171   Cardiac Enzymes: No results for input(s): CKTOTAL, CKMB, CKMBINDEX, TROPONINI in the last 168 hours. BNP  (last 3 results) Recent Labs    09/24/19 2159 09/25/19 0440 10/09/19 1142  BNP 26.2 37.9 58.2    ProBNP (last 3 results) No results for input(s): PROBNP in the last 8760 hours.  CBG: Recent Labs  Lab 10/15/19 1704 10/15/19 1706 10/15/19 1801 10/15/19 2100 10/16/19 0820  GLUCAP 37* 42* 92 228* 315*    Recent Results (from the past 240 hour(s))  Respiratory Panel by RT PCR (Flu A&B, Covid) - Nasopharyngeal Swab     Status: None   Collection Time: 10/09/19  6:43 PM   Specimen: Nasopharyngeal Swab  Result Value Ref Range Status   SARS Coronavirus 2 by RT PCR NEGATIVE NEGATIVE Final    Comment: (NOTE) SARS-CoV-2 target nucleic acids are NOT DETECTED. The SARS-CoV-2 RNA is generally detectable in upper respiratoy specimens during the acute phase of infection. The lowest concentration of SARS-CoV-2 viral copies this assay can detect is 131 copies/mL. A negative result does not preclude SARS-Cov-2 infection and should not be used as the sole basis for treatment or other patient management decisions. A negative result may occur with  improper specimen collection/handling, submission of  specimen other than nasopharyngeal swab, presence of viral mutation(s) within the areas targeted by this assay, and inadequate number of viral copies (<131 copies/mL). A negative result must be combined with clinical observations, patient history, and epidemiological information. The expected result is Negative. Fact Sheet for Patients:  PinkCheek.be Fact Sheet for Healthcare Providers:  GravelBags.it This test is not yet ap proved or cleared by the [place] FDA and  has been authorized for detection and/or diagnosis of SARS-CoV-2 by FDA under an Emergency Use Authorization (EUA). This EUA will remain  in effect (meaning this test can be used) for the duration of the COVID-19 declaration under Section 564(b)(1) of the Act, 21  U.S.C. section 360bbb-3(b)(1), unless the authorization is terminated or revoked sooner.    Influenza A by PCR NEGATIVE NEGATIVE Final   Influenza B by PCR NEGATIVE NEGATIVE Final    Comment: (NOTE) The Xpert Xpress SARS-CoV-2/FLU/RSV assay is intended as an aid in  the diagnosis of influenza from Nasopharyngeal swab specimens and  should not be used as a sole basis for treatment. Nasal washings and  aspirates are unacceptable for Xpert Xpress SARS-CoV-2/FLU/RSV  testing. Fact Sheet for Patients: PinkCheek.be Fact Sheet for Healthcare Providers: GravelBags.it This test is not yet approved or cleared by the [place] FDA and  has been authorized for detection and/or diagnosis of SARS-CoV-2 by  FDA under an Emergency Use Authorization (EUA). This EUA will remain  in effect (meaning this test can be used) for the duration of the  Covid-19 declaration under Section 564(b)(1) of the Act, 21  U.S.C. section 360bbb-3(b)(1), unless the authorization is  terminated or revoked. Performed at [facility], [place] [address] [place], [place] [address]   MRSA PCR Screening     Status: None   Collection Time: 10/09/19  9:30 PM   Specimen: Nasopharyngeal  Result Value Ref Range Status   MRSA by PCR NEGATIVE NEGATIVE Final    Comment:        The GeneXpert MRSA Assay (FDA approved for NASAL specimens only), is one component of a comprehensive MRSA colonization surveillance program. It is not intended to diagnose MRSA infection nor to guide or monitor treatment for MRSA infections. Performed at [facility], [place] [address], [place], [place] [address]      Studies: No results found.  Scheduled Meds: . amLODipine  2.5 mg Oral QHS  . aspirin EC  81 mg Oral QHS  . budesonide (PULMICORT) nebulizer solution  0.25 mg Nebulization BID  . carvedilol  25 mg Oral BID WC  . clopidogrel  75 mg Oral QHS  . docusate sodium   100 mg Oral BID  . fluticasone  1 spray Each Nare Daily  . guaiFENesin  600 mg Oral BID  . heparin  5,000 Units Subcutaneous Q8H  . insulin aspart  0-20 Units Subcutaneous TID WC  . insulin aspart  0-5 Units Subcutaneous QHS  . insulin regular human CONCENTRATED  60 Units Subcutaneous TID WC  . ipratropium-albuterol  3 mL Nebulization TID  . QUEtiapine  300 mg Oral QHS  . rosuvastatin  20 mg Oral Daily  . senna-docusate  1 tablet Oral BID  . sodium chloride flush  3 mL Intravenous Q12H  . Vitamin D (Ergocalciferol)  50,000 Units Oral Q Mon   Continuous Infusions: . sodium chloride      Active Problems:   Diabetes (HCC)   COPD exacerbation (HCC)   DKA (diabetic ketoacidoses) (HCC)   Heart failure  with preserved ejection fraction [facility])   Palliative care encounter    Time spent: >25 minutes 3    Emeterio Reeve  [facility]  10/16/2019, 11:10 AM  LOS: 6 days

## 2019-10-16 NOTE — Plan of Care (Signed)

## 2019-10-16 NOTE — Evaluation (Signed)
Physical Therapy Evaluation Patient Details Name: Reginald Duncan MRN: [id number] DOB: 10/19/1951 Today's Date: 10/16/2019   History of Present Illness  Reginald Duncan is a 68 y.o. male past medical history significant for CAD status post LAD stent 2014, CVA, COPD, stage III CKD, diabetes, hypertension, OSA, history of ACS November 2020, cath show multiple vessel coronary artery disease, decision was to treated medically.  Recurrent admission for COPD exacerbation, recently discharged from the hospital 09/27/2019 for COPD exacerbation.  Clinical Impression  Patient presents with mild dependencies in gait and transfers.  Overall, functioning at supervision to modified independent level.  Mild balance deficits noted.  Patient appears to be limited more by his fears/sadness than physically.  Patient will benefit from HHPT at discharge to address balance and increase independence and safety with mobility.      Follow Up Recommendations Home health PT    Equipment Recommendations  None recommended by PT    Recommendations for Other Services       Precautions / Restrictions Precautions Precautions: None      Mobility  Bed Mobility Overal bed mobility: (up in chair upon arrival to room)                Transfers Overall transfer level: Modified independent Equipment used: Quad cane Transfers: Sit to/from Stand Sit to Stand: Modified independent (Device/Increase time)            Ambulation/Gait Ambulation/Gait assistance: Min guard Gait Distance (Feet): 70 Feet Assistive device: Quad cane Gait Pattern/deviations: Step-through pattern;Decreased stride length;Trunk flexed Gait velocity: Decreased   General Gait Details: slow, steady gait, tolerated mild perturbations during gait with no loss of balance.  Stairs            Wheelchair Mobility    Modified Rankin (Stroke Patients Only)       Balance Overall balance assessment: Mild deficits observed, not formally  tested Sitting-balance support: No upper extremity supported;Feet supported Sitting balance-Leahy Scale: Good     Standing balance support: Bilateral upper extremity supported Standing balance-Leahy Scale: Fair                               Pertinent Vitals/Pain Pain Assessment: No/denies pain    Home Living Family/patient expects to be discharged to:: Private residence Living Arrangements: Alone Available Help at Discharge: Friend(s);Available PRN/intermittently Type of Home: Apartment Home Access: Elevator     Home Layout: One level Home Equipment: Cane - quad Additional Comments: small based quad cane. Pt reports friend cannot come to home, only available over phone    Prior Function Level of Independence: Independent with assistive device(s)               Hand Dominance        Extremity/Trunk Assessment   Upper Extremity Assessment Upper Extremity Assessment: Generalized weakness    Lower Extremity Assessment Lower Extremity Assessment: Generalized weakness    Cervical / Trunk Assessment Cervical / Trunk Assessment: Kyphotic  Communication   Communication: No difficulties  Cognition Arousal/Alertness: Awake/alert Behavior During Therapy: WFL for tasks assessed/performed Overall Cognitive Status: Within Functional Limits for tasks assessed                                        General Comments      Exercises     Assessment/Plan  PT Assessment Patient needs continued PT services  PT Problem List Decreased strength;Decreased activity tolerance;Decreased balance;Decreased mobility;Cardiopulmonary status limiting activity       PT Treatment Interventions DME instruction;Gait training;Functional mobility training;Therapeutic activities;Therapeutic exercise;Balance training;Patient/family education    PT Goals (Current goals can be found in the Care Plan section)  Acute Rehab PT Goals Patient Stated Goal: I want to  go home, but I am scared to go home alone PT Goal Formulation: With patient Time For Goal Achievement: 10/23/19 Potential to Achieve Goals: Good    Frequency Min 3X/week   Barriers to discharge Decreased caregiver support patient with severe claustrophobia; patient also appears sad about current situation, loss of loved ones recently    Co-evaluation               AM-PAC PT "6 Clicks" Mobility  Outcome Measure Help needed turning from your back to your side while in a flat bed without using bedrails?: A Little Help needed moving from lying on your back to sitting on the side of a flat bed without using bedrails?: A Little Help needed moving to and from a bed to a chair (including a wheelchair)?: A Little Help needed standing up from a chair using your arms (e.g., wheelchair or bedside chair)?: A Little Help needed to walk in hospital room?: A Little Help needed climbing 3-5 steps with a railing? : A Little 6 Click Score: 18    End of Session Equipment Utilized During Treatment: Gait belt Activity Tolerance: Patient tolerated treatment well;Patient limited by fatigue Patient left: in chair;with call bell/phone within reach   PT Visit Diagnosis: Unsteadiness on feet (R26.81);Muscle weakness (generalized) (M62.81);History of falling (Z91.81)    Time: [id number] PT Time Calculation (min) (ACUTE ONLY): 20 min   Charges:   PT Evaluation $PT Eval Moderate Complexity: 1 Mod          10/16/2019 Margie, PT Acute Rehabilitation Services Pager:  [phone] Office:  [phone]    Shanna Cisco 10/16/2019, 11:01 AM

## 2019-10-17 LAB — GLUCOSE, CAPILLARY
Glucose-Capillary: 97 mg/dL (ref 70–99)
Glucose-Capillary: 262 mg/dL — ABNORMAL HIGH (ref 70–99)

## 2019-10-17 LAB — MAGNESIUM: Magnesium: 1.9 mg/dL (ref 1.7–2.4)

## 2019-10-17 MED ORDER — ALPRAZOLAM 0.5 MG PO TABS: 0.5000 mg | ORAL_TABLET | Freq: Three times a day (TID) | ORAL | 0 refills | Status: DC | PRN

## 2019-10-17 MED ORDER — PREDNISONE 10 MG PO TABS: ORAL_TABLET | ORAL | 0 refills | Status: DC

## 2019-10-17 NOTE — Progress Notes (Signed)
Physical Therapy Treatment Patient Details Name: Reginald Duncan MRN: [id number] DOB: [birth year] Today's Date: 10/17/2019    History of Present Illness Reginald DEVINCENT is a 68 y.o. male past medical history significant for CAD status post LAD stent 2014, CVA, COPD, stage III CKD, diabetes, hypertension, OSA, history of ACS November 2020, cath show multiple vessel coronary artery disease, decision was to treated medically.  Recurrent admission for COPD exacerbation, recently discharged from the hospital 09/27/2019 for COPD exacerbation.    PT Comments    Patient seen for mobility progression. Pt tolerated gait distance of 90 ft total with standing rest breaks due to feeling SOB and claustrophobic. Cues for breathing technique throughout session. Pt agreeable that use of RW upon d/c will be safest option given increased stability and efficiency with mobility. Pt reports having RW at home that is in good condition. Current plan remains appropriate.     Follow Up Recommendations  Home health PT     Equipment Recommendations  None recommended by PT    Recommendations for Other Services       Precautions / Restrictions Precautions Precautions: None Precaution Comments: knees giving way (per pt, did not happen during session), painful with ambulation Restrictions Weight Bearing Restrictions: No    Mobility  Bed Mobility               General bed mobility comments: pt OOB in chair upon arrival   Transfers Overall transfer level: Modified independent   Transfers: Sit to/from Stand           General transfer comment: pt needs single UE support upon standing   Ambulation/Gait Ambulation/Gait assistance: Min guard;Supervision Gait Distance (Feet): (~90 ft total ) Assistive device: Quad cane;Rolling walker (2 wheeled) Gait Pattern/deviations: Step-through pattern;Decreased stride length;Trunk flexed;Wide base of support Gait velocity: Decreased   General Gait Details: pt  with decreased cadence and lateral sway using cane; LOB when turning to speak to someone in the hallway; pt is much more steady with use of RW and task appears less effortful for pt when using bilat UE support; standing rest breaks required due to SOB and feeling claustrophobic with mask on    Stairs             Wheelchair Mobility    Modified Rankin (Stroke Patients Only)       Balance Overall balance assessment: Mild deficits observed, not formally tested Sitting-balance support: No upper extremity supported;Feet supported Sitting balance-Leahy Scale: Good     Standing balance support: Bilateral upper extremity supported Standing balance-Leahy Scale: Fair                              Cognition Arousal/Alertness: Awake/alert Behavior During Therapy: Anxious Overall Cognitive Status: Within Functional Limits for tasks assessed                                 General Comments: pt reports getting claustrophobic and anxious with mobility due to feeling SOB       Exercises      General Comments General comments (skin integrity, edema, etc.): SpO2 98% on RA while ambulating       Pertinent Vitals/Pain Pain Assessment: Faces Faces Pain Scale: Hurts little more Pain Location: back, knees Pain Descriptors / Indicators: Grimacing;Guarding;Sore Pain Intervention(s): Limited activity within patient's tolerance;Monitored during session;Repositioned    Home Living  Prior Function            PT Goals (current goals can now be found in the care plan section) Acute Rehab PT Goals Patient Stated Goal: I want to go home, but I am scared to go home alone Progress towards PT goals: Progressing toward goals    Frequency    Min 3X/week      PT Plan Current plan remains appropriate    Co-evaluation              AM-PAC PT "6 Clicks" Mobility   Outcome Measure  Help needed turning from your back to your  side while in a flat bed without using bedrails?: A Little Help needed moving from lying on your back to sitting on the side of a flat bed without using bedrails?: A Little Help needed moving to and from a bed to a chair (including a wheelchair)?: A Little Help needed standing up from a chair using your arms (e.g., wheelchair or bedside chair)?: A Little Help needed to walk in hospital room?: A Little Help needed climbing 3-5 steps with a railing? : A Little 6 Click Score: 18    End of Session Equipment Utilized During Treatment: Gait belt Activity Tolerance: Other (comment)(limited by feeling SOB) Patient left: in chair;with call bell/phone within reach;with nursing/sitter in room Nurse Communication: Mobility status PT Visit Diagnosis: Unsteadiness on feet (R26.81);Muscle weakness (generalized) (M62.81);History of falling (Z91.81) Pain - Right/Left: (back) Pain - part of body: (back)     Time: [id number] PT Time Calculation (min) (ACUTE ONLY): 23 min  Charges:  $Gait Training: 23-37 mins                     Earney Navy, PTA Acute Rehabilitation Services Pager: [phone] Office: [phone]     Darliss Cheney 10/17/2019, 9:56 AM

## 2019-10-17 NOTE — TOC Transition Note (Addendum)
Transition of Care [facility]) - CM/SW Discharge Note   Patient Details  Name: Reginald Duncan MRN: [id number] Date of Birth: Dec 31, 1950  [facility] CM/SW Contact:  Benard Halsted, LCSW Phone Number: 10/17/2019, 9:53 AM   Clinical Narrative:    CSW notified [facility] of patient's discharge today. Home Health orders placed. No other needs identified at this time.   CSW consulted for request to see what pharmacy can fill meds today. [facility] and [facility] [place], [place] [address].   Final next level of care: Home w Home Health Services Barriers to Discharge: No Barriers Identified   Patient Goals and CMS Choice   CMS Medicare.gov Compare Post Acute Care list provided to:: Patient    Discharge Placement                       Discharge Plan and Services                              Date [place]: 10/15/19 Time [place]: [id number] Representative spoke with at [place]: [place] ([place]) Interventions     Readmission Risk Interventions Readmission Risk Prevention Plan 09/27/2019  Transportation Screening Complete  PCP or Specialist Appt within 3-5 Days Complete  HRI or [place] Complete  Social Work Consult for [place] Planning/Counseling Complete  Palliative Care Screening Not Applicable  Medication Review Press photographer) Complete  Some recent data might be hidden

## 2019-10-17 NOTE — Discharge Summary (Signed)
Physician Discharge Summary  Reginald Duncan [id number] DOB: June 03, 1951 DOA: 10/09/2019  PCP: [name], PA  Admit date: 10/09/2019 Discharge date: 10/17/2019 LOS 7 days  Time spent: 45 minutes  Recommendations for Outpatient Follow-up:  1. Follow with PCP for COPD reassessment in 1-2 weeks  2. Discuss anxiety w/ PCP. Ideally would like to avoid chronic benzodiazepine given risks or respiratory depression, however his anxiety significantly worsens his SOB and the benzodiazepines help these symptoms/QOL. May benefit from behavioral health consultation CBT to cope with SOB/anxiety worsening one another, psychiatry consultation re medications as patient has tried and failed multiple maintenance Rx  3. Home health  4. Pt has f/u in place w/ cardiology, consider Lasix as outpatient   Discharge Diagnoses:  Active Problems:   Diabetes ([place])   COPD exacerbation ([place])   DKA (diabetic ketoacidoses) ([place])   Heart failure with preserved ejection fraction [facility])   Palliative care encounter   Discharge Condition: stable, good  Diet recommendation: diabetic, heart healthy   Filed Weights   10/14/19 0319 10/15/19 0555 10/16/19 0500  Weight: (!) 137.4 kg 129.5 kg 135.6 kg    History of present illness:  Pt reports SOB and anxiety today but no worsening. See hospital course.   Hospital Course:  68 year old male with history of CAD status post LAD stent in 2014, CVA unspecified, COPD with recent recurrent hospitalization with last discharge on 09/27/2019, chronic hypoxic respiratory failure on supplemental oxygen only intermittently, chronic kidney disease stage III, diabetes mellitus type 2, hypertension, OSA, anxiety with claustrophobia, chronic back pain, depression, GERD, hypertension, hyperlipidemia and acute coronary syndrome in November 2020 with cardiac cath showing multiple vessel coronary artery disease with recommendations to treat medically as per cardiology presented with  worsening shortness of breath and almost 20 pound weight gain. Chest x-ray showed mild lingular atelectasis. COVID-19 testing was negative. He was admitted with COPD exacerbation  COPD Improved over course of admission, though complaints of SOB and cough remain. Patient is extremely anxious. He wants to stay in the hospital but he is refusing placement in SNF/rehab when it was made clear he was not in need of intensive hospital care   Glc under poor control A1C 10.0, occasional hyperglycemia once DKA resolved   Hx DCHF no clinical exacerbation, cardiology signed off. Recommended COreg, I stopped home metoprolol.    Procedures:  See imaging below  Consultations:  Cardiology   Discharge Exam: Vitals:   10/17/19 0804 10/17/19 0820  BP: 125/76   Pulse: 72 72  Resp:  18  Temp: 98.1 F (36.7 C)   SpO2: 95% 98%    General: anxious, NAD Cardiovascular: S1S2 RRR Respiratory: coarse breath sounds diffuse bilaterally, minimal wheeze diffuse bilaterally, patient phonation audible   Discharge Instructions    Allergies as of 10/17/2019      Reactions   Mold Extract [trichophyton] Shortness Of Breath, Other (See Comments)   Headaches and respiratory symptoms      Medication List    STOP taking these medications   metoprolol succinate 100 MG 24 hr tablet Commonly known as: TOPROL-XL     TAKE these medications   albuterol 108 (90 Base) MCG/ACT inhaler Commonly known as: VENTOLIN HFA Inhale 2 puffs into the lungs every 6 (six) hours as needed for wheezing or shortness of breath. What changed: when to take this   ALPRAZolam 0.5 MG tablet Commonly known as: XANAX Take 1 tablet (0.5 mg total) by mouth 3 (three) times daily as needed for anxiety. Discuss  with PCP regarding refills / increased dose What changed:   when to take this  additional instructions   amLODipine 2.5 MG tablet Commonly known as: NORVASC Take 2.5 mg by mouth at bedtime.   aspirin 81 MG  tablet Take 81 mg by mouth at bedtime.   carvedilol 25 MG tablet Commonly known as: COREG Take 75 mg by mouth 2 (two) times daily.   clopidogrel 75 MG tablet Commonly known as: PLAVIX TAKE 1 TABLET BY MOUTH EVERY DAY WITH BREAKFAST What changed:   how much to take  how to take this  when to take this  additional instructions   famotidine 20 MG tablet Commonly known as: PEPCID Take 20 mg by mouth at bedtime.   fluticasone 50 MCG/ACT nasal spray Commonly known as: FLONASE Place 2 sprays into both nostrils 3 (three) times daily.   furosemide 40 MG tablet Commonly known as: LASIX Take 1 tablet (40 mg total) by mouth 2 (two) times daily. What changed: when to take this   guaiFENesin 600 MG 12 hr tablet Commonly known as: MUCINEX Take 1 tablet (600 mg total) by mouth 2 (two) times daily.   HYDROcodone-acetaminophen 7.5-325 MG tablet Commonly known as: NORCO Take 1 tablet by mouth 3 (three) times daily.   insulin regular human CONCENTRATED 500 UNIT/ML kwikpen Commonly known as: HUMULIN R Inject 15 Units into the skin daily with lunch. What changed:   how much to take  when to take this  additional instructions   nitroGLYCERIN 0.4 MG SL tablet Commonly known as: NITROSTAT Place 1 tablet (0.4 mg total) under the tongue every 5 (five) minutes as needed. Chest pain   OVER THE COUNTER MEDICATION Apply 1 application topically daily as needed. Medication:Clear Barrier Moisture Ointment. Apply to groin area as needed for dryness. Per patient   predniSONE 10 MG tablet Commonly known as: DELTASONE Take 5 tablets (50 mg total) by mouth daily before breakfast for 2 days, THEN 4 tablets (40 mg total) daily before breakfast for 2 days, THEN 3 tablets (30 mg total) daily before breakfast for 2 days, THEN 2 tablets (20 mg total) daily before breakfast for 2 days, THEN 1 tablet (10 mg total) daily before breakfast for 2 days, THEN 0.5 tablets (5 mg total) daily before breakfast  for 2 days. Start taking on: October 17, 2019 What changed:   medication strength  See the new instructions.   pregabalin 75 MG capsule Commonly known as: LYRICA Take 75-150 mg by mouth See admin instructions. Take 75 mg by mouth in the morning and 150 mg at bedtime   QUEtiapine 300 MG tablet Commonly known as: SEROQUEL Take 300 mg by mouth at bedtime.   rosuvastatin 20 MG tablet Commonly known as: CRESTOR TAKE 1 TABLET DAILY   umeclidinium-vilanterol 62.5-25 MCG/INH Aepb Commonly known as: ANORO ELLIPTA Inhale 1 puff into the lungs daily. What changed: when to take this   Vascepa 1 g capsule Generic drug: icosapent Ethyl Take 2 g by mouth 2 (two) times daily.   Vitamin D (Ergocalciferol) 1.25 MG (50000 UT) Caps capsule Commonly known as: DRISDOL Take 50,000 Units by mouth every Monday.      Allergies  Allergen Reactions  . Mold Extract [Trichophyton] Shortness Of Breath and Other (See Comments)    Headaches and respiratory symptoms   Follow-up Information    [facility] Physician. Go on 10/20/2019.   Why: 1:20 pm with  Carlyle Lipa NP Contact information: [place]  [facility].   Specialty: [place] Why: home health services arranged Contact information: [place] [address] [phone]             The results of significant diagnostics from this hospitalization (including imaging, microbiology, ancillary and laboratory) are listed below for reference.    Significant Diagnostic Studies: DG Chest 2 View  Result Date: 10/09/2019 CLINICAL DATA:  Shortness of breath. EXAM: CHEST - 2 VIEW COMPARISON:  Chest x-ray 09/24/2019. FINDINGS: Mediastinum and hilar structures normal. Heart size normal. Mild lingular subsegmental atelectasis. No pleural effusion or pneumothorax. Degenerative change thoracic spine. IMPRESSION: Mild lingular subsegmental atelectasis, otherwise  negative exam. Electronically Signed   By: Marcello Moores  Register   On: 10/09/2019 11:56   DG Chest Port 1 View  Result Date: 09/24/2019 CLINICAL DATA:  Shortness of breath EXAM: PORTABLE CHEST 1 VIEW COMPARISON:  09/15/2019 FINDINGS: The heart size and mediastinal contours are within normal limits. Both lungs are clear. Aortic atherosclerosis. The visualized skeletal structures are unremarkable. IMPRESSION: No active disease. Electronically Signed   By: Donavan Foil M.D.   On: 09/24/2019 22:17    Microbiology: Recent Results (from the past 240 hour(s))  Respiratory Panel by RT PCR (Flu A&B, Covid) - Nasopharyngeal Swab     Status: None   Collection Time: 10/09/19  6:43 PM   Specimen: Nasopharyngeal Swab  Result Value Ref Range Status   SARS Coronavirus 2 by RT PCR NEGATIVE NEGATIVE Final    Comment: (NOTE) SARS-CoV-2 target nucleic acids are NOT DETECTED. The SARS-CoV-2 RNA is generally detectable in upper respiratoy specimens during the acute phase of infection. The lowest concentration of SARS-CoV-2 viral copies this assay can detect is 131 copies/mL. A negative result does not preclude SARS-Cov-2 infection and should not be used as the sole basis for treatment or other patient management decisions. A negative result may occur with  improper specimen collection/handling, submission of specimen other than nasopharyngeal swab, presence of viral mutation(s) within the areas targeted by this assay, and inadequate number of viral copies (<131 copies/mL). A negative result must be combined with clinical observations, patient history, and epidemiological information. The expected result is Negative. Fact Sheet for Patients:  PinkCheek.be Fact Sheet for Healthcare Providers:  GravelBags.it This test is not yet ap proved or cleared by the [place] FDA and  has been authorized for detection and/or diagnosis of SARS-CoV-2 by FDA under  an Emergency Use Authorization (EUA). This EUA will remain  in effect (meaning this test can be used) for the duration of the COVID-19 declaration under Section 564(b)(1) of the Act, 21 U.S.C. section 360bbb-3(b)(1), unless the authorization is terminated or revoked sooner.    Influenza A by PCR NEGATIVE NEGATIVE Final   Influenza B by PCR NEGATIVE NEGATIVE Final    Comment: (NOTE) The Xpert Xpress SARS-CoV-2/FLU/RSV assay is intended as an aid in  the diagnosis of influenza from Nasopharyngeal swab specimens and  should not be used as a sole basis for treatment. Nasal washings and  aspirates are unacceptable for Xpert Xpress SARS-CoV-2/FLU/RSV  testing. Fact Sheet for Patients: PinkCheek.be Fact Sheet for Healthcare Providers: GravelBags.it This test is not yet approved or cleared by the [place] FDA and  has been authorized for detection and/or diagnosis of SARS-CoV-2 by  FDA under an Emergency Use Authorization (EUA). This EUA will remain  in effect (meaning this test can be used) for the duration of the  Covid-19 declaration under Section  564(b)(1) of the Act, 21  U.S.C. section 360bbb-3(b)(1), unless the authorization is  terminated or revoked. Performed at [facility], [place] [address], [place], [place] [address]   MRSA PCR Screening     Status: None   Collection Time: 10/09/19  9:30 PM   Specimen: Nasopharyngeal  Result Value Ref Range Status   MRSA by PCR NEGATIVE NEGATIVE Final    Comment:        The GeneXpert MRSA Assay (FDA approved for NASAL specimens only), is one component of a comprehensive MRSA colonization surveillance program. It is not intended to diagnose MRSA infection nor to guide or monitor treatment for MRSA infections. Performed at [facility], [place] [address], [place], [place] [address]      Labs: Basic Metabolic Panel: Recent Labs  Lab 10/11/19 0253 10/12/19 [id number]  10/13/19 0259 10/14/19 0211 10/15/19 0222 10/16/19 0323 10/17/19 0208  NA 138 143 143 142 139  --   --   K 4.1 3.2* 3.6 3.5 3.9  --   --   CL 99 102 99 99 96*  --   --   CO2 26 29 32 30 32  --   --   GLUCOSE 337* 104* 65* 136* 128*  --   --   BUN 42* 44* 45* 45* 51*  --   --   CREATININE 1.42* 1.27* 1.45* 1.45* 1.53*  --   --   CALCIUM 9.1 9.0 9.1 8.9 9.0  --   --   MG  --  2.0  --  2.2 2.2 2.2 1.9   Liver Function Tests: No results for input(s): AST, ALT, ALKPHOS, BILITOT, PROT, ALBUMIN in the last 168 hours. No results for input(s): LIPASE, AMYLASE in the last 168 hours. No results for input(s): AMMONIA in the last 168 hours. CBC: No results for input(s): WBC, NEUTROABS, HGB, HCT, MCV, PLT in the last 168 hours. Cardiac Enzymes: No results for input(s): CKTOTAL, CKMB, CKMBINDEX, TROPONINI in the last 168 hours. BNP: BNP (last 3 results) Recent Labs    09/24/19 2159 09/25/19 0440 10/09/19 1142  BNP 26.2 37.9 58.2    ProBNP (last 3 results) No results for input(s): PROBNP in the last 8760 hours.  CBG: Recent Labs  Lab 10/16/19 1217 10/16/19 1321 10/16/19 1620 10/16/19 2103 10/17/19 0802  GLUCAP 471* 426* 292* 169* 97       Signed:  Emeterio Reeve MD.  [facility] 10/17/2019, 11:20 AM

## 2019-10-20 DIAGNOSIS — G4700 Insomnia, unspecified: Secondary | ICD-10-CM | POA: Diagnosis not present

## 2019-10-20 DIAGNOSIS — J449 Chronic obstructive pulmonary disease, unspecified: Secondary | ICD-10-CM | POA: Diagnosis not present

## 2019-10-20 DIAGNOSIS — G894 Chronic pain syndrome: Secondary | ICD-10-CM | POA: Diagnosis not present

## 2019-10-20 DIAGNOSIS — I5030 Unspecified diastolic (congestive) heart failure: Secondary | ICD-10-CM | POA: Diagnosis not present

## 2019-10-20 DIAGNOSIS — E1165 Type 2 diabetes mellitus with hyperglycemia: Secondary | ICD-10-CM | POA: Diagnosis not present

## 2019-10-20 DIAGNOSIS — N183 Chronic kidney disease, stage 3 unspecified: Secondary | ICD-10-CM | POA: Diagnosis not present

## 2019-10-20 DIAGNOSIS — E559 Vitamin D deficiency, unspecified: Secondary | ICD-10-CM | POA: Diagnosis not present

## 2019-10-20 DIAGNOSIS — E1140 Type 2 diabetes mellitus with diabetic neuropathy, unspecified: Secondary | ICD-10-CM | POA: Diagnosis not present

## 2019-10-20 DIAGNOSIS — I2510 Atherosclerotic heart disease of native coronary artery without angina pectoris: Secondary | ICD-10-CM | POA: Diagnosis not present

## 2019-10-20 LAB — GLUCOSE, CAPILLARY: Glucose-Capillary: 37 mg/dL — CL (ref 70–99)

## 2019-10-23 ENCOUNTER — Other Ambulatory Visit: Payer: Self-pay

## 2019-10-23 NOTE — Patient Outreach (Signed)
[facility]) Care Management  10/23/2019  ANES FROSS 1951-10-03 [id number]    EMMI-General Discharge RED ON EMMI ALERT Day #4 Date: 10/22/2019 Red Alert Reason: "Lost interest in things? Yes Sad/hopeless/anxious/empty? Yes  Other questions/problems? Yes"   Outreach attempt # 1 to patient.Spoke with patient who reported that he is doing fairly okay. He voices that he has a lot going on at present. He states that today his apartment is being "fumigated to get rid of bed bugs" and he must be out of the apartment for at least six hours. He states that they just arrived to his apartment and he is currently sitting outside in his car. He reports some SOB after having walked from apartment to parking lot to get to car. RN CM assessed if patient had taken any of his resp meds and he reported he had just woke up and then had to leave. He states that all his meds are in the apartment. RN CM instructed patient to do some deep breathing and to sit in car of a few minutes to rest.Then advised him to go back to apartment to get meds so he can have them with him and take them. He voiced understanding. He states that he was going to go over a friend's house hopefully while he is awaiting for apartment to be fumigated. Reviewed and addressed red alerts. Patient reports that he is just "frustrated and tired" with all that he has going on. He denies an suicidal/homicidal ideation. Patient has standing history of depression/anxiety. He reports that he went to see new PCP on Monday. He discussed with MD that he needs new neb machine and MD was to order it. He voices that the called office yesterday and left message for nurse and is awaiting return call back as he has not received it yet. Patient also active with [facility] and [facility]. RN CM dicussed with patient the need to stay connected with these agencies as they are available to assist him when his medical issues as well as  explained the difference in services. RN CM provided patient with contact info to store in his cell phone for both agencies. He states that he thinks both agencies have contacted him and scheduled visit but he is unable to recall at the moment. Advised him to contact both agencies today to follow up. He denies any further RN CM needs or concerns at this time.     Plan: RN CM will close case as patient enrolled in external program.  Enzo Montgomery, RN,BSN,CCM [facility] [facility] Telephonic Care Management Coordinator Direct Phone: [phone] Toll Free: [phone] Fax: [phone]

## 2019-10-25 DIAGNOSIS — I5033 Acute on chronic diastolic (congestive) heart failure: Secondary | ICD-10-CM | POA: Diagnosis not present

## 2019-10-25 DIAGNOSIS — I472 Ventricular tachycardia: Secondary | ICD-10-CM | POA: Diagnosis not present

## 2019-10-25 DIAGNOSIS — D631 Anemia in chronic kidney disease: Secondary | ICD-10-CM | POA: Diagnosis not present

## 2019-10-25 DIAGNOSIS — J9611 Chronic respiratory failure with hypoxia: Secondary | ICD-10-CM | POA: Diagnosis not present

## 2019-10-25 DIAGNOSIS — F419 Anxiety disorder, unspecified: Secondary | ICD-10-CM | POA: Diagnosis not present

## 2019-10-25 DIAGNOSIS — R000 Tachycardia, unspecified: Secondary | ICD-10-CM | POA: Diagnosis not present

## 2019-10-25 DIAGNOSIS — E1122 Type 2 diabetes mellitus with diabetic chronic kidney disease: Secondary | ICD-10-CM | POA: Diagnosis not present

## 2019-10-25 DIAGNOSIS — I2510 Atherosclerotic heart disease of native coronary artery without angina pectoris: Secondary | ICD-10-CM | POA: Diagnosis not present

## 2019-10-25 DIAGNOSIS — J441 Chronic obstructive pulmonary disease with (acute) exacerbation: Secondary | ICD-10-CM | POA: Diagnosis not present

## 2019-10-25 DIAGNOSIS — E1165 Type 2 diabetes mellitus with hyperglycemia: Secondary | ICD-10-CM | POA: Diagnosis not present

## 2019-10-25 DIAGNOSIS — Z5321 Procedure and treatment not carried out due to patient leaving prior to being seen by health care provider: Secondary | ICD-10-CM | POA: Diagnosis not present

## 2019-10-25 DIAGNOSIS — R0602 Shortness of breath: Secondary | ICD-10-CM | POA: Diagnosis not present

## 2019-10-25 DIAGNOSIS — I10 Essential (primary) hypertension: Secondary | ICD-10-CM | POA: Diagnosis not present

## 2019-10-25 DIAGNOSIS — R457 State of emotional shock and stress, unspecified: Secondary | ICD-10-CM | POA: Diagnosis not present

## 2019-10-25 DIAGNOSIS — I130 Hypertensive heart and chronic kidney disease with heart failure and stage 1 through stage 4 chronic kidney disease, or unspecified chronic kidney disease: Secondary | ICD-10-CM | POA: Diagnosis not present

## 2019-10-25 DIAGNOSIS — N183 Chronic kidney disease, stage 3 unspecified: Secondary | ICD-10-CM | POA: Diagnosis not present

## 2019-10-27 DIAGNOSIS — F411 Generalized anxiety disorder: Secondary | ICD-10-CM | POA: Diagnosis not present

## 2019-10-27 DIAGNOSIS — J301 Allergic rhinitis due to pollen: Secondary | ICD-10-CM | POA: Diagnosis not present

## 2019-10-27 DIAGNOSIS — J45998 Other asthma: Secondary | ICD-10-CM | POA: Diagnosis not present

## 2019-10-27 DIAGNOSIS — J449 Chronic obstructive pulmonary disease, unspecified: Secondary | ICD-10-CM | POA: Diagnosis not present

## 2019-10-27 DIAGNOSIS — J9611 Chronic respiratory failure with hypoxia: Secondary | ICD-10-CM | POA: Diagnosis not present

## 2019-10-27 DIAGNOSIS — R5383 Other fatigue: Secondary | ICD-10-CM | POA: Diagnosis not present

## 2019-10-27 DIAGNOSIS — E662 Morbid (severe) obesity with alveolar hypoventilation: Secondary | ICD-10-CM | POA: Diagnosis not present

## 2019-10-27 DIAGNOSIS — G4733 Obstructive sleep apnea (adult) (pediatric): Secondary | ICD-10-CM | POA: Diagnosis not present

## 2019-10-27 DIAGNOSIS — J441 Chronic obstructive pulmonary disease with (acute) exacerbation: Secondary | ICD-10-CM | POA: Diagnosis not present
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
## 2019-10-28 ENCOUNTER — Telehealth: Payer: Self-pay | Admitting: Internal Medicine

## 2019-10-28 ENCOUNTER — Other Ambulatory Visit: Payer: Self-pay

## 2019-10-28 ENCOUNTER — Telehealth: Payer: Self-pay | Admitting: Physician Assistant

## 2019-10-28 ENCOUNTER — Encounter: Payer: Self-pay | Admitting: Physician Assistant

## 2019-10-28 ENCOUNTER — Telehealth (INDEPENDENT_AMBULATORY_CARE_PROVIDER_SITE_OTHER): Payer: Medicare HMO | Admitting: Physician Assistant

## 2019-10-28 VITALS — Ht 73.0 in | Wt 298.0 lb

## 2019-10-28 DIAGNOSIS — N183 Chronic kidney disease, stage 3 unspecified: Secondary | ICD-10-CM | POA: Diagnosis not present

## 2019-10-28 DIAGNOSIS — E782 Mixed hyperlipidemia: Secondary | ICD-10-CM

## 2019-10-28 DIAGNOSIS — E1122 Type 2 diabetes mellitus with diabetic chronic kidney disease: Secondary | ICD-10-CM | POA: Diagnosis not present

## 2019-10-28 DIAGNOSIS — J449 Chronic obstructive pulmonary disease, unspecified: Secondary | ICD-10-CM | POA: Diagnosis not present

## 2019-10-28 DIAGNOSIS — R0600 Dyspnea, unspecified: Secondary | ICD-10-CM

## 2019-10-28 DIAGNOSIS — I509 Heart failure, unspecified: Secondary | ICD-10-CM

## 2019-10-28 DIAGNOSIS — I2510 Atherosclerotic heart disease of native coronary artery without angina pectoris: Secondary | ICD-10-CM

## 2019-10-28 DIAGNOSIS — Z7189 Other specified counseling: Secondary | ICD-10-CM

## 2019-10-28 DIAGNOSIS — Z955 Presence of coronary angioplasty implant and graft: Secondary | ICD-10-CM | POA: Diagnosis not present

## 2019-10-28 DIAGNOSIS — I130 Hypertensive heart and chronic kidney disease with heart failure and stage 1 through stage 4 chronic kidney disease, or unspecified chronic kidney disease: Secondary | ICD-10-CM

## 2019-10-28 NOTE — Patient Instructions (Signed)
Medication Instructions:  Your physician recommends that you continue on your current medications as directed. Please refer to the Current Medication list given to you today.  *If you need a refill on your cardiac medications before your next appointment, please call your pharmacy*  Lab Work: None ordered  If you have labs (blood work) drawn today and your tests are completely normal, you will receive your results only by: [name] MyChart Message (if you have MyChart) OR . A paper copy in the mail If you have any lab test that is abnormal or we need to change your treatment, we will call you to review the results.  Testing/Procedures: None ordered  Follow-Up: At [facility], you and your health needs are our priority.  As part of our continuing mission to provide you with exceptional heart care, we have created designated Provider Care Teams.  These Care Teams include your primary Cardiologist (physician) and Advanced Practice Providers (APPs -  Physician Assistants and Nurse Practitioners) who all work together to provide you with the care you need, when you need it.  Your next appointment:   Tomorrow, Wednesday, 10/29/2019 PLEASE ARRIVE AT 11:15.  The format for your next appointment:   In person  Provider:   Robbie Lis, PA-C

## 2019-10-28 NOTE — Telephone Encounter (Signed)
Addend to previous phone note.  I did advise pt to contact his Pulmonary Dr. Re: COPD and he advised me that he saw him yesterday and then he had to call 911 last night to due to difficulty breathing and they advised him that he couldh't go to the ED due to them sending pt's to other hospitals and that it was a 20 hour wait.

## 2019-10-28 NOTE — Telephone Encounter (Signed)
Patient is requesting assistance into the building once he arrives for his appt with Vin Bhagat at 3:00. States he will call this number when he arrives.

## 2019-10-28 NOTE — Telephone Encounter (Signed)
Returned call to pt.  He is having a COPD exacerbation and he don't think he can walk from parking lot to building and is requesting assistance.   He doesn't have anyone that can accompany him.  I advised pt to call when he gets here and we will see if someone is available.   Pt very appreciative for the call back.

## 2019-10-28 NOTE — Telephone Encounter (Signed)
We have changed your appointment to virtual due to having trouble with his COPD.  YOUR CARDIOLOGY TEAM HAS ARRANGED FOR AN E-VISIT FOR YOUR APPOINTMENT - PLEASE REVIEW IMPORTANT INFORMATION BELOW SEVERAL DAYS PRIOR TO YOUR APPOINTMENT  Due to the recent COVID-19 pandemic, we are transitioning in-person office visits to tele-medicine visits in an effort to decrease unnecessary exposure to our patients, their families, and staff. These visits are billed to your insurance just like a normal visit is. We also encourage you to sign up for MyChart if you have not already done so. You will need a smartphone if possible. For patients that do not have this, we can still complete the visit using a regular telephone but do prefer a smartphone to enable video when possible. You may have a family member that lives with you that can help. If possible, we also ask that you have a blood pressure cuff and scale at home to measure your blood pressure, heart rate and weight prior to your scheduled appointment. Patients with clinical needs that need an in-person evaluation and testing will still be able to come to the office if absolutely necessary. If you have any questions, feel free to call our office.     YOUR PROVIDER WILL BE USING THE FOLLOWING PLATFORM TO COMPLETE YOUR VISIT: Staff: Please delete this text and fill in MyChart/Doximity/Doxy.Me   . IF USING DOXIMITY or DOXY.ME - The staff will give you instructions on receiving your link to join the meeting the day of your visit.     CONSENT FOR TELE-HEALTH VISIT - PLEASE REVIEW  I hereby voluntarily request, consent and authorize [facility] and its employed or contracted physicians, physician assistants, nurse practitioners or other licensed health care professionals (the Practitioner), to provide me with telemedicine health care services (the "Services") as deemed necessary by the treating Practitioner. I acknowledge and consent to receive the Services by  the Practitioner via telemedicine. I understand that the telemedicine visit will involve communicating with the Practitioner through live audiovisual communication technology and the disclosure of certain medical information by electronic transmission. I acknowledge that I have been given the opportunity to request an in-person assessment or other available alternative prior to the telemedicine visit and am voluntarily participating in the telemedicine visit.  I understand that I have the right to withhold or withdraw my consent to the use of telemedicine in the course of my care at any time, without affecting my right to future care or treatment, and that the Practitioner or I may terminate the telemedicine visit at any time. I understand that I have the right to inspect all information obtained and/or recorded in the course of the telemedicine visit and may receive copies of available information for a reasonable fee.  I understand that some of the potential risks of receiving the Services via telemedicine include:  [name] Delay or interruption in medical evaluation due to technological equipment failure or disruption; . Information transmitted may not be sufficient (e.g. poor resolution of images) to allow for appropriate medical decision making by the Practitioner; and/or  . In rare instances, security protocols could fail, causing a breach of personal health information.  Furthermore, I acknowledge that it is my responsibility to provide information about my medical history, conditions and care that is complete and accurate to the best of my ability. I acknowledge that Practitioner's advice, recommendations, and/or decision may be based on factors not within their control, such as incomplete or inaccurate data provided by me or distortions  of diagnostic images or specimens that may result from electronic transmissions. I understand that the practice of medicine is not an exact science and that Practitioner  makes no warranties or guarantees regarding treatment outcomes. I acknowledge that I will receive a copy of this consent concurrently upon execution via email to the email address I last provided but may also request a printed copy by calling the office of [place].    I understand that my insurance will be billed for this visit.   I have read or had this consent read to me. . I understand the contents of this consent, which adequately explains the benefits and risks of the Services being provided via telemedicine.  . I have been provided ample opportunity to ask questions regarding this consent and the Services and have had my questions answered to my satisfaction. . I give my informed consent for the services to be provided through the use of telemedicine in my medical care  By participating in this telemedicine visit I agree to the above.

## 2019-10-29 ENCOUNTER — Encounter (HOSPITAL_COMMUNITY): Payer: Self-pay

## 2019-10-29 ENCOUNTER — Other Ambulatory Visit: Payer: Self-pay

## 2019-10-29 ENCOUNTER — Ambulatory Visit: Payer: Medicare HMO | Admitting: Physician Assistant

## 2019-10-29 ENCOUNTER — Emergency Department (HOSPITAL_COMMUNITY): Payer: Medicare HMO

## 2019-10-29 ENCOUNTER — Inpatient Hospital Stay (HOSPITAL_COMMUNITY)
Admission: EM | Admit: 2019-10-29 | Discharge: 2019-11-10 | DRG: 871 | Disposition: A | Discharge status: Skilled Nursing Facility | Payer: Medicare HMO | Attending: Internal Medicine | Admitting: Internal Medicine

## 2019-10-29 ENCOUNTER — Telehealth: Payer: Self-pay | Admitting: Physician Assistant

## 2019-10-29 DIAGNOSIS — R531 Weakness: Secondary | ICD-10-CM | POA: Diagnosis not present

## 2019-10-29 DIAGNOSIS — A4189 Other specified sepsis: Secondary | ICD-10-CM | POA: Diagnosis not present

## 2019-10-29 DIAGNOSIS — E876 Hypokalemia: Secondary | ICD-10-CM | POA: Diagnosis present

## 2019-10-29 DIAGNOSIS — F329 Major depressive disorder, single episode, unspecified: Secondary | ICD-10-CM | POA: Diagnosis present

## 2019-10-29 DIAGNOSIS — R0600 Dyspnea, unspecified: Secondary | ICD-10-CM

## 2019-10-29 DIAGNOSIS — I129 Hypertensive chronic kidney disease with stage 1 through stage 4 chronic kidney disease, or unspecified chronic kidney disease: Secondary | ICD-10-CM | POA: Diagnosis present

## 2019-10-29 DIAGNOSIS — E1142 Type 2 diabetes mellitus with diabetic polyneuropathy: Secondary | ICD-10-CM | POA: Diagnosis present

## 2019-10-29 DIAGNOSIS — Z7902 Long term (current) use of antithrombotics/antiplatelets: Secondary | ICD-10-CM

## 2019-10-29 DIAGNOSIS — U071 COVID-19: Secondary | ICD-10-CM | POA: Diagnosis not present

## 2019-10-29 DIAGNOSIS — R0902 Hypoxemia: Secondary | ICD-10-CM | POA: Diagnosis not present

## 2019-10-29 DIAGNOSIS — Z7401 Bed confinement status: Secondary | ICD-10-CM | POA: Diagnosis not present

## 2019-10-29 DIAGNOSIS — E669 Obesity, unspecified: Secondary | ICD-10-CM | POA: Diagnosis present

## 2019-10-29 DIAGNOSIS — J9611 Chronic respiratory failure with hypoxia: Secondary | ICD-10-CM | POA: Diagnosis not present

## 2019-10-29 DIAGNOSIS — I5033 Acute on chronic diastolic (congestive) heart failure: Secondary | ICD-10-CM | POA: Diagnosis not present

## 2019-10-29 DIAGNOSIS — N1831 Chronic kidney disease, stage 3a: Secondary | ICD-10-CM | POA: Diagnosis not present

## 2019-10-29 DIAGNOSIS — I472 Ventricular tachycardia: Secondary | ICD-10-CM | POA: Diagnosis not present

## 2019-10-29 DIAGNOSIS — J449 Chronic obstructive pulmonary disease, unspecified: Secondary | ICD-10-CM | POA: Diagnosis not present

## 2019-10-29 DIAGNOSIS — I69334 Monoplegia of upper limb following cerebral infarction affecting left non-dominant side: Secondary | ICD-10-CM

## 2019-10-29 DIAGNOSIS — E785 Hyperlipidemia, unspecified: Secondary | ICD-10-CM | POA: Diagnosis present

## 2019-10-29 DIAGNOSIS — K219 Gastro-esophageal reflux disease without esophagitis: Secondary | ICD-10-CM | POA: Diagnosis present

## 2019-10-29 DIAGNOSIS — Z823 Family history of stroke: Secondary | ICD-10-CM

## 2019-10-29 DIAGNOSIS — G8929 Other chronic pain: Secondary | ICD-10-CM | POA: Diagnosis not present

## 2019-10-29 DIAGNOSIS — E1151 Type 2 diabetes mellitus with diabetic peripheral angiopathy without gangrene: Secondary | ICD-10-CM | POA: Diagnosis present

## 2019-10-29 DIAGNOSIS — J069 Acute upper respiratory infection, unspecified: Secondary | ICD-10-CM | POA: Diagnosis not present

## 2019-10-29 DIAGNOSIS — R064 Hyperventilation: Secondary | ICD-10-CM | POA: Diagnosis not present

## 2019-10-29 DIAGNOSIS — E118 Type 2 diabetes mellitus with unspecified complications: Secondary | ICD-10-CM

## 2019-10-29 DIAGNOSIS — E119 Type 2 diabetes mellitus without complications: Secondary | ICD-10-CM | POA: Diagnosis not present

## 2019-10-29 DIAGNOSIS — Z8051 Family history of malignant neoplasm of kidney: Secondary | ICD-10-CM

## 2019-10-29 DIAGNOSIS — F0390 Unspecified dementia without behavioral disturbance: Secondary | ICD-10-CM | POA: Diagnosis present

## 2019-10-29 DIAGNOSIS — J441 Chronic obstructive pulmonary disease with (acute) exacerbation: Secondary | ICD-10-CM | POA: Diagnosis not present

## 2019-10-29 DIAGNOSIS — F418 Other specified anxiety disorders: Secondary | ICD-10-CM | POA: Diagnosis not present

## 2019-10-29 DIAGNOSIS — I2510 Atherosclerotic heart disease of native coronary artery without angina pectoris: Secondary | ICD-10-CM | POA: Diagnosis not present

## 2019-10-29 DIAGNOSIS — G4733 Obstructive sleep apnea (adult) (pediatric): Secondary | ICD-10-CM | POA: Diagnosis not present

## 2019-10-29 DIAGNOSIS — D696 Thrombocytopenia, unspecified: Secondary | ICD-10-CM | POA: Diagnosis present

## 2019-10-29 DIAGNOSIS — Z794 Long term (current) use of insulin: Secondary | ICD-10-CM

## 2019-10-29 DIAGNOSIS — E162 Hypoglycemia, unspecified: Secondary | ICD-10-CM

## 2019-10-29 DIAGNOSIS — J181 Lobar pneumonia, unspecified organism: Secondary | ICD-10-CM | POA: Diagnosis not present

## 2019-10-29 DIAGNOSIS — Z955 Presence of coronary angioplasty implant and graft: Secondary | ICD-10-CM

## 2019-10-29 DIAGNOSIS — A419 Sepsis, unspecified organism: Secondary | ICD-10-CM | POA: Diagnosis not present

## 2019-10-29 DIAGNOSIS — Z713 Dietary counseling and surveillance: Secondary | ICD-10-CM

## 2019-10-29 DIAGNOSIS — J9601 Acute respiratory failure with hypoxia: Secondary | ICD-10-CM | POA: Diagnosis present

## 2019-10-29 DIAGNOSIS — F40240 Claustrophobia: Secondary | ICD-10-CM | POA: Diagnosis present

## 2019-10-29 DIAGNOSIS — I454 Nonspecific intraventricular block: Secondary | ICD-10-CM | POA: Diagnosis not present

## 2019-10-29 DIAGNOSIS — R0602 Shortness of breath: Secondary | ICD-10-CM | POA: Diagnosis not present

## 2019-10-29 DIAGNOSIS — I639 Cerebral infarction, unspecified: Secondary | ICD-10-CM | POA: Diagnosis not present

## 2019-10-29 DIAGNOSIS — Z9641 Presence of insulin pump (external) (internal): Secondary | ICD-10-CM | POA: Diagnosis present

## 2019-10-29 DIAGNOSIS — N179 Acute kidney failure, unspecified: Secondary | ICD-10-CM | POA: Diagnosis present

## 2019-10-29 DIAGNOSIS — F419 Anxiety disorder, unspecified: Secondary | ICD-10-CM | POA: Diagnosis not present

## 2019-10-29 DIAGNOSIS — K429 Umbilical hernia without obstruction or gangrene: Secondary | ICD-10-CM | POA: Diagnosis present

## 2019-10-29 DIAGNOSIS — Z79899 Other long term (current) drug therapy: Secondary | ICD-10-CM

## 2019-10-29 DIAGNOSIS — I4510 Unspecified right bundle-branch block: Secondary | ICD-10-CM | POA: Diagnosis not present

## 2019-10-29 DIAGNOSIS — J9620 Acute and chronic respiratory failure, unspecified whether with hypoxia or hypercapnia: Secondary | ICD-10-CM | POA: Diagnosis present

## 2019-10-29 DIAGNOSIS — E8770 Fluid overload, unspecified: Secondary | ICD-10-CM | POA: Diagnosis not present

## 2019-10-29 DIAGNOSIS — D631 Anemia in chronic kidney disease: Secondary | ICD-10-CM | POA: Diagnosis not present

## 2019-10-29 DIAGNOSIS — I252 Old myocardial infarction: Secondary | ICD-10-CM | POA: Diagnosis not present

## 2019-10-29 DIAGNOSIS — M2550 Pain in unspecified joint: Secondary | ICD-10-CM | POA: Diagnosis not present

## 2019-10-29 DIAGNOSIS — R040 Epistaxis: Secondary | ICD-10-CM | POA: Diagnosis not present

## 2019-10-29 DIAGNOSIS — E1165 Type 2 diabetes mellitus with hyperglycemia: Secondary | ICD-10-CM | POA: Diagnosis not present

## 2019-10-29 DIAGNOSIS — E1122 Type 2 diabetes mellitus with diabetic chronic kidney disease: Secondary | ICD-10-CM | POA: Diagnosis not present

## 2019-10-29 DIAGNOSIS — Z7982 Long term (current) use of aspirin: Secondary | ICD-10-CM

## 2019-10-29 DIAGNOSIS — T380X5A Adverse effect of glucocorticoids and synthetic analogues, initial encounter: Secondary | ICD-10-CM | POA: Diagnosis not present

## 2019-10-29 DIAGNOSIS — E11649 Type 2 diabetes mellitus with hypoglycemia without coma: Secondary | ICD-10-CM | POA: Diagnosis not present

## 2019-10-29 DIAGNOSIS — Z8249 Family history of ischemic heart disease and other diseases of the circulatory system: Secondary | ICD-10-CM

## 2019-10-29 DIAGNOSIS — E161 Other hypoglycemia: Secondary | ICD-10-CM | POA: Diagnosis not present

## 2019-10-29 DIAGNOSIS — R5383 Other fatigue: Secondary | ICD-10-CM | POA: Diagnosis not present

## 2019-10-29 DIAGNOSIS — Z888 Allergy status to other drugs, medicaments and biological substances status: Secondary | ICD-10-CM

## 2019-10-29 DIAGNOSIS — J440 Chronic obstructive pulmonary disease with acute lower respiratory infection: Secondary | ICD-10-CM | POA: Diagnosis not present

## 2019-10-29 DIAGNOSIS — I130 Hypertensive heart and chronic kidney disease with heart failure and stage 1 through stage 4 chronic kidney disease, or unspecified chronic kidney disease: Secondary | ICD-10-CM | POA: Diagnosis not present

## 2019-10-29 DIAGNOSIS — Z6839 Body mass index (BMI) 39.0-39.9, adult: Secondary | ICD-10-CM

## 2019-10-29 DIAGNOSIS — I739 Peripheral vascular disease, unspecified: Secondary | ICD-10-CM | POA: Diagnosis not present

## 2019-10-29 DIAGNOSIS — I119 Hypertensive heart disease without heart failure: Secondary | ICD-10-CM | POA: Diagnosis not present

## 2019-10-29 DIAGNOSIS — R079 Chest pain, unspecified: Secondary | ICD-10-CM | POA: Diagnosis not present

## 2019-10-29 DIAGNOSIS — N183 Chronic kidney disease, stage 3 unspecified: Secondary | ICD-10-CM | POA: Diagnosis not present

## 2019-10-29 DIAGNOSIS — E1140 Type 2 diabetes mellitus with diabetic neuropathy, unspecified: Secondary | ICD-10-CM | POA: Diagnosis not present

## 2019-10-29 DIAGNOSIS — J1282 Pneumonia due to coronavirus disease 2019: Secondary | ICD-10-CM | POA: Diagnosis present

## 2019-10-29 DIAGNOSIS — J9621 Acute and chronic respiratory failure with hypoxia: Secondary | ICD-10-CM | POA: Diagnosis not present

## 2019-10-29 DIAGNOSIS — R000 Tachycardia, unspecified: Secondary | ICD-10-CM

## 2019-10-29 DIAGNOSIS — E8779 Other fluid overload: Secondary | ICD-10-CM | POA: Diagnosis not present

## 2019-10-29 DIAGNOSIS — I10 Essential (primary) hypertension: Secondary | ICD-10-CM | POA: Diagnosis not present

## 2019-10-29 LAB — COMPREHENSIVE METABOLIC PANEL
ALT: 46 U/L — ABNORMAL HIGH (ref 0–44)
AST: 34 U/L (ref 15–41)
Albumin: 2.5 g/dL — ABNORMAL LOW (ref 3.5–5.0)
Alkaline Phosphatase: 47 U/L (ref 38–126)
Anion gap: 17 — ABNORMAL HIGH (ref 5–15)
BUN: 43 mg/dL — ABNORMAL HIGH (ref 8–23)
CO2: 27 mmol/L (ref 22–32)
Calcium: 9.2 mg/dL (ref 8.9–10.3)
Chloride: 94 mmol/L — ABNORMAL LOW (ref 98–111)
Creatinine, Ser: 1.53 mg/dL — ABNORMAL HIGH (ref 0.61–1.24)
GFR calc Af Amer: 54 mL/min — ABNORMAL LOW (ref >60)
GFR calc non Af Amer: 46 mL/min — ABNORMAL LOW (ref >60)
Glucose, Bld: 167 mg/dL — ABNORMAL HIGH (ref 70–99)
Potassium: 3.6 mmol/L (ref 3.5–5.1)
Sodium: 138 mmol/L (ref 135–145)
Total Bilirubin: 1 mg/dL (ref 0.3–1.2)
Total Protein: 6.4 g/dL — ABNORMAL LOW (ref 6.5–8.1)

## 2019-10-29 LAB — CBC WITH DIFFERENTIAL/PLATELET
Abs Immature Granulocytes: 0.18 10*3/uL — ABNORMAL HIGH (ref 0.00–0.07)
Basophils Absolute: 0 10*3/uL (ref 0.0–0.1)
Basophils Relative: 0 %
Eosinophils Absolute: 0 10*3/uL (ref 0.0–0.5)
Eosinophils Relative: 0 %
HCT: 42.4 % (ref 39.0–52.0)
Hemoglobin: 13.9 g/dL (ref 13.0–17.0)
Immature Granulocytes: 2 %
Lymphocytes Relative: 9 %
Lymphs Abs: 0.8 10*3/uL (ref 0.7–4.0)
MCH: 30.1 pg (ref 26.0–34.0)
MCHC: 32.8 g/dL (ref 30.0–36.0)
MCV: 91.8 fL (ref 80.0–100.0)
Monocytes Absolute: 0.4 10*3/uL (ref 0.1–1.0)
Monocytes Relative: 5 %
Neutro Abs: 7.8 10*3/uL — ABNORMAL HIGH (ref 1.7–7.7)
Neutrophils Relative %: 84 %
Platelets: 136 10*3/uL — ABNORMAL LOW (ref 150–400)
RBC: 4.62 MIL/uL (ref 4.22–5.81)
RDW: 16.9 % — ABNORMAL HIGH (ref 11.5–15.5)
WBC: 9.2 10*3/uL (ref 4.0–10.5)
nRBC: 0 % (ref 0.0–0.2)

## 2019-10-29 LAB — CBG MONITORING, ED
Glucose-Capillary: 15 mg/dL — CL (ref 70–99)
Glucose-Capillary: 40 mg/dL — CL (ref 70–99)
Glucose-Capillary: 49 mg/dL — ABNORMAL LOW (ref 70–99)
Glucose-Capillary: 67 mg/dL — ABNORMAL LOW (ref 70–99)
Glucose-Capillary: 73 mg/dL (ref 70–99)
Glucose-Capillary: 85 mg/dL (ref 70–99)
Glucose-Capillary: 95 mg/dL (ref 70–99)

## 2019-10-29 LAB — POC SARS CORONAVIRUS 2 AG -  ED: SARS Coronavirus 2 Ag: POSITIVE — AB

## 2019-10-29 MED ORDER — DEXTROSE 50 % IV SOLN
INTRAVENOUS | Status: AC
  Filled 2019-10-29: qty 50

## 2019-10-29 MED ORDER — DEXTROSE 50 % IV SOLN
50.0000 mL | Freq: Once | INTRAVENOUS | Status: AC
  Administered 2019-10-29: 50 mL via INTRAVENOUS

## 2019-10-29 MED ORDER — ALBUTEROL SULFATE HFA 108 (90 BASE) MCG/ACT IN AERS
2.0000 | INHALATION_SPRAY | RESPIRATORY_TRACT | Status: DC
  Administered 2019-10-29 – 2019-11-02 (×24): 2 via RESPIRATORY_TRACT
  Filled 2019-10-29 (×4): qty 6.7

## 2019-10-29 MED ORDER — DEXAMETHASONE SODIUM PHOSPHATE 10 MG/ML IJ SOLN
8.0000 mg | Freq: Once | INTRAMUSCULAR | Status: AC
  Administered 2019-10-29: 8 mg via INTRAVENOUS
  Filled 2019-10-29: qty 1

## 2019-10-29 MED ORDER — ACETAMINOPHEN 500 MG PO TABS
1000.0000 mg | ORAL_TABLET | Freq: Once | ORAL | Status: AC
  Administered 2019-10-29: 1000 mg via ORAL
  Filled 2019-10-29: qty 2

## 2019-10-29 MED ORDER — SODIUM CHLORIDE 0.9 % IV SOLN
100.0000 mg | Freq: Every day | INTRAVENOUS | Status: DC
  Filled 2019-10-29: qty 20

## 2019-10-29 MED ORDER — SODIUM CHLORIDE 0.9 % IV SOLN
200.0000 mg | Freq: Once | INTRAVENOUS | Status: DC
  Filled 2019-10-29: qty 40

## 2019-10-29 MED ORDER — SODIUM CHLORIDE 0.9 % IV BOLUS
500.0000 mL | Freq: Once | INTRAVENOUS | Status: AC
  Administered 2019-10-29: 500 mL via INTRAVENOUS

## 2019-10-29 MED ORDER — DEXAMETHASONE SODIUM PHOSPHATE 10 MG/ML IJ SOLN
10.0000 mg | Freq: Once | INTRAMUSCULAR | Status: AC
  Administered 2019-10-29: 10 mg via INTRAVENOUS
  Filled 2019-10-29: qty 1

## 2019-10-29 MED ORDER — DEXTROSE-NACL 5-0.45 % IV SOLN
INTRAVENOUS | Status: DC
  Administered 2019-10-29: 1 mL via INTRAVENOUS

## 2019-10-29 NOTE — Telephone Encounter (Signed)
I spoke to Milton from [facility] who called because the patient's HR is still elevated and patient remains SOB.  The patient had a Virtual visit with Vin on 1/5 and Vin recommended OV on 1/6 @ 11:30.    The patient called morning of 1/6 and cancelled.  The patient was then rescheduled for a Virtual visit with Scott on 1/8.  I cancelled that knowing Vin wanted an in office visit, so I was able to schedule the patient on 1/12 with Vin.   I informed Claiborne Billings that if symptoms worsen, the patient needs to go to the ED.  She verbalized understanding and informed patient of appt.

## 2019-10-29 NOTE — Telephone Encounter (Signed)
STAT if HR is under 50 or over 120 (normal HR is 60-100 beats per minute)  1) What is your heart rate? Between 120 and 130  2) Do you have a log of your heart rate readings (document readings)?  no  3) Do you have any other symptoms?  Oxygen level the highest is 91, short of breath, respiration is 20

## 2019-10-29 NOTE — ED Notes (Signed)
CBG Results of 49 reported to [place], RN.
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
## 2019-10-29 NOTE — ED Triage Notes (Signed)
Pt BIB [facility] with COPD exacerbation. Pt O2 93% on 4 L [place]. Pt reporting generalized weakness, dizziness, unable to eat/drink for past few days. CBG w/ EMS was 63, pt given 250 mL D50, CBG incr to 107. Hx of chronic back pain, COPD.

## 2019-10-29 NOTE — ED Notes (Signed)
Pt's CBG result was 40. Informed Dorian Pod - RN, [place] - RN.

## 2019-10-29 NOTE — ED Notes (Signed)
CBG Results of 95 reported to [place], RN.

## 2019-10-29 NOTE — ED Notes (Signed)
Pt given apple juice and "ER Happy Meal", per Dr. Lacinda Axon.

## 2019-10-29 NOTE — ED Notes (Signed)
This Rn walked by room and found pt slumped over in the bed, minimally responsive to pain, EDP made aware CBG 15. 1 amp of D50 verbally ordered and given
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
## 2019-10-29 NOTE — ED Notes (Signed)
Pt given graham crackers and apple juice, repeat CBG 85

## 2019-10-30 DIAGNOSIS — J9621 Acute and chronic respiratory failure with hypoxia: Secondary | ICD-10-CM

## 2019-10-30 DIAGNOSIS — F419 Anxiety disorder, unspecified: Secondary | ICD-10-CM

## 2019-10-30 DIAGNOSIS — I10 Essential (primary) hypertension: Secondary | ICD-10-CM

## 2019-10-30 LAB — CREATININE, SERUM
Creatinine, Ser: 1.33 mg/dL — ABNORMAL HIGH (ref 0.61–1.24)
GFR calc Af Amer: >60 mL/min (ref >60)
GFR calc non Af Amer: 55 mL/min — ABNORMAL LOW (ref >60)

## 2019-10-30 LAB — CBG MONITORING, ED
Glucose-Capillary: 55 mg/dL — ABNORMAL LOW (ref 70–99)
Glucose-Capillary: 125 mg/dL — ABNORMAL HIGH (ref 70–99)
Glucose-Capillary: 322 mg/dL — ABNORMAL HIGH (ref 70–99)
Glucose-Capillary: 431 mg/dL — ABNORMAL HIGH (ref 70–99)
Glucose-Capillary: 433 mg/dL — ABNORMAL HIGH (ref 70–99)
Glucose-Capillary: 370 mg/dL — ABNORMAL HIGH (ref 70–99)

## 2019-10-30 LAB — CBC WITH DIFFERENTIAL/PLATELET
Abs Immature Granulocytes: 0.07 10*3/uL (ref 0.00–0.07)
Basophils Absolute: 0 10*3/uL (ref 0.0–0.1)
Basophils Relative: 0 %
Eosinophils Absolute: 0 10*3/uL (ref 0.0–0.5)
Eosinophils Relative: 0 %
HCT: 37.3 % — ABNORMAL LOW (ref 39.0–52.0)
Hemoglobin: 12 g/dL — ABNORMAL LOW (ref 13.0–17.0)
Immature Granulocytes: 1 %
Lymphocytes Relative: 7 %
Lymphs Abs: 0.4 10*3/uL — ABNORMAL LOW (ref 0.7–4.0)
MCH: 30.1 pg (ref 26.0–34.0)
MCHC: 32.2 g/dL (ref 30.0–36.0)
MCV: 93.5 fL (ref 80.0–100.0)
Monocytes Absolute: 0.1 10*3/uL (ref 0.1–1.0)
Monocytes Relative: 3 %
Neutro Abs: 4.5 10*3/uL (ref 1.7–7.7)
Neutrophils Relative %: 89 %
Platelets: 104 10*3/uL — ABNORMAL LOW (ref 150–400)
RBC: 3.99 MIL/uL — ABNORMAL LOW (ref 4.22–5.81)
RDW: 16.7 % — ABNORMAL HIGH (ref 11.5–15.5)
WBC: 5.1 10*3/uL (ref 4.0–10.5)
nRBC: 0 % (ref 0.0–0.2)

## 2019-10-30 LAB — CBC
HCT: 39.6 % (ref 39.0–52.0)
Hemoglobin: 13.1 g/dL (ref 13.0–17.0)
MCH: 30.7 pg (ref 26.0–34.0)
MCHC: 33.1 g/dL (ref 30.0–36.0)
MCV: 92.7 fL (ref 80.0–100.0)
Platelets: 126 10*3/uL — ABNORMAL LOW (ref 150–400)
RBC: 4.27 MIL/uL (ref 4.22–5.81)
RDW: 16.7 % — ABNORMAL HIGH (ref 11.5–15.5)
WBC: 6.6 10*3/uL (ref 4.0–10.5)
nRBC: 0 % (ref 0.0–0.2)

## 2019-10-30 LAB — C-REACTIVE PROTEIN: CRP: 23 mg/dL — ABNORMAL HIGH (ref <1.0)

## 2019-10-30 LAB — BRAIN NATRIURETIC PEPTIDE: B Natriuretic Peptide: 46.1 pg/mL (ref 0.0–100.0)

## 2019-10-30 LAB — D-DIMER, QUANTITATIVE: D-Dimer, Quant: 0.33 ug/mL-FEU (ref 0.00–0.50)

## 2019-10-30 LAB — PHOSPHORUS: Phosphorus: 4 mg/dL (ref 2.5–4.6)

## 2019-10-30 LAB — ABO/RH: ABO/RH(D): O POS

## 2019-10-30 LAB — FERRITIN: Ferritin: 353 ng/mL — ABNORMAL HIGH (ref 24–336)

## 2019-10-30 LAB — MAGNESIUM: Magnesium: 1.9 mg/dL (ref 1.7–2.4)

## 2019-10-30 MED ORDER — ROSUVASTATIN CALCIUM 20 MG PO TABS
20.0000 mg | ORAL_TABLET | Freq: Every day | ORAL | Status: DC
  Administered 2019-10-30 – 2019-11-10 (×12): 20 mg via ORAL
  Filled 2019-10-30 (×3): qty 1
  Filled 2019-10-30: qty 4
  Filled 2019-10-30 (×8): qty 1

## 2019-10-30 MED ORDER — AMLODIPINE BESYLATE 2.5 MG PO TABS
2.5000 mg | ORAL_TABLET | Freq: Every day | ORAL | Status: DC
  Administered 2019-10-30 – 2019-11-10 (×13): 2.5 mg via ORAL
  Filled 2019-10-30 (×13): qty 1

## 2019-10-30 MED ORDER — TOCILIZUMAB 400 MG/20ML IV SOLN
8.0000 mg/kg | Freq: Once | INTRAVENOUS | Status: AC
  Administered 2019-10-30: 640 mg via INTRAVENOUS
  Filled 2019-10-30: qty 32

## 2019-10-30 MED ORDER — CLOPIDOGREL BISULFATE 75 MG PO TABS
75.0000 mg | ORAL_TABLET | Freq: Every day | ORAL | Status: DC
  Administered 2019-10-30 – 2019-11-10 (×12): 75 mg via ORAL
  Filled 2019-10-30 (×12): qty 1

## 2019-10-30 MED ORDER — ICOSAPENT ETHYL 1 G PO CAPS
2.0000 g | ORAL_CAPSULE | Freq: Three times a day (TID) | ORAL | Status: DC
  Administered 2019-10-30 – 2019-11-06 (×21): 2 g via ORAL
  Filled 2019-10-30 (×28): qty 2

## 2019-10-30 MED ORDER — SODIUM CHLORIDE 0.9% FLUSH
3.0000 mL | Freq: Two times a day (BID) | INTRAVENOUS | Status: DC
  Administered 2019-10-30 – 2019-11-08 (×17): 3 mL via INTRAVENOUS
  Administered 2019-11-08: 9 mL via INTRAVENOUS
  Administered 2019-11-09 – 2019-11-10 (×4): 3 mL via INTRAVENOUS

## 2019-10-30 MED ORDER — ZINC SULFATE 220 (50 ZN) MG PO CAPS
220.0000 mg | ORAL_CAPSULE | Freq: Every day | ORAL | Status: DC
  Administered 2019-10-30 – 2019-11-10 (×12): 220 mg via ORAL
  Filled 2019-10-30 (×13): qty 1

## 2019-10-30 MED ORDER — SODIUM CHLORIDE 0.9 % IV SOLN: 100.0000 mg | Freq: Every day | INTRAVENOUS | Status: DC

## 2019-10-30 MED ORDER — SODIUM CHLORIDE 0.9 % IV SOLN
200.0000 mg | Freq: Once | INTRAVENOUS | Status: DC
  Administered 2019-10-30: 200 mg via INTRAVENOUS

## 2019-10-30 MED ORDER — SODIUM CHLORIDE 0.9 % IV SOLN: 250.0000 mL | INTRAVENOUS | Status: DC | PRN

## 2019-10-30 MED ORDER — LORAZEPAM 2 MG/ML IJ SOLN
1.0000 mg | Freq: Two times a day (BID) | INTRAMUSCULAR | Status: DC | PRN
  Administered 2019-10-30 – 2019-10-31 (×2): 1 mg via INTRAVENOUS
  Filled 2019-10-30 (×2): qty 1

## 2019-10-30 MED ORDER — VITAMIN D (ERGOCALCIFEROL) 1.25 MG (50000 UNIT) PO CAPS
50000.0000 [IU] | ORAL_CAPSULE | ORAL | Status: DC
  Administered 2019-11-03 – 2019-11-10 (×2): 50000 [IU] via ORAL
  Filled 2019-10-30 (×3): qty 1

## 2019-10-30 MED ORDER — SODIUM CHLORIDE 0.9% FLUSH: 3.0000 mL | INTRAVENOUS | Status: DC | PRN

## 2019-10-30 MED ORDER — ENOXAPARIN SODIUM 40 MG/0.4ML ~~LOC~~ SOLN
40.0000 mg | SUBCUTANEOUS | Status: DC
  Administered 2019-10-30 – 2019-10-31 (×2): 40 mg via SUBCUTANEOUS
  Filled 2019-10-30 (×2): qty 0.4

## 2019-10-30 MED ORDER — PREGABALIN 25 MG PO CAPS: 75.0000 mg | ORAL_CAPSULE | ORAL | Status: DC

## 2019-10-30 MED ORDER — INSULIN DETEMIR 100 UNIT/ML ~~LOC~~ SOLN
0.1500 [IU]/kg | Freq: Two times a day (BID) | SUBCUTANEOUS | Status: DC
  Administered 2019-10-30 – 2019-10-31 (×3): 20 [IU] via SUBCUTANEOUS
  Filled 2019-10-30 (×4): qty 0.2

## 2019-10-30 MED ORDER — FAMOTIDINE 20 MG PO TABS
20.0000 mg | ORAL_TABLET | Freq: Every day | ORAL | Status: DC
  Administered 2019-10-30 – 2019-11-10 (×13): 20 mg via ORAL
  Filled 2019-10-30 (×13): qty 1

## 2019-10-30 MED ORDER — CARVEDILOL 25 MG PO TABS
50.0000 mg | ORAL_TABLET | Freq: Two times a day (BID) | ORAL | Status: DC
  Administered 2019-10-30 – 2019-11-10 (×25): 50 mg via ORAL
  Filled 2019-10-30 (×11): qty 2
  Filled 2019-10-30: qty 4
  Filled 2019-10-30 (×2): qty 2
  Filled 2019-10-30: qty 4
  Filled 2019-10-30 (×2): qty 2
  Filled 2019-10-30: qty 4
  Filled 2019-10-30 (×7): qty 2

## 2019-10-30 MED ORDER — ADULT MULTIVITAMIN W/MINERALS CH
1.0000 | ORAL_TABLET | Freq: Every day | ORAL | Status: DC
  Administered 2019-10-30 – 2019-11-10 (×12): 1 via ORAL
  Filled 2019-10-30 (×12): qty 1

## 2019-10-30 MED ORDER — SODIUM CHLORIDE 0.9 % IV SOLN
500.0000 mg | INTRAVENOUS | Status: DC
  Administered 2019-10-30 – 2019-10-31 (×2): 500 mg via INTRAVENOUS
  Filled 2019-10-30 (×3): qty 500

## 2019-10-30 MED ORDER — ASCORBIC ACID 500 MG PO TABS
500.0000 mg | ORAL_TABLET | Freq: Every day | ORAL | Status: DC
  Administered 2019-10-30 – 2019-11-10 (×12): 500 mg via ORAL
  Filled 2019-10-30 (×12): qty 1

## 2019-10-30 MED ORDER — ALBUTEROL SULFATE HFA 108 (90 BASE) MCG/ACT IN AERS
2.0000 | INHALATION_SPRAY | Freq: Four times a day (QID) | RESPIRATORY_TRACT | Status: DC | PRN
  Administered 2019-10-30 – 2019-11-07 (×7): 2 via RESPIRATORY_TRACT
  Filled 2019-10-30: qty 6.7

## 2019-10-30 MED ORDER — QUETIAPINE FUMARATE 300 MG PO TABS
300.0000 mg | ORAL_TABLET | Freq: Every day | ORAL | Status: DC
  Administered 2019-10-30 – 2019-10-31 (×2): 300 mg via ORAL
  Filled 2019-10-30: qty 1
  Filled 2019-10-30 (×2): qty 2

## 2019-10-30 MED ORDER — ALPRAZOLAM 0.25 MG PO TABS
0.5000 mg | ORAL_TABLET | Freq: Two times a day (BID) | ORAL | Status: DC | PRN
  Administered 2019-10-30: 0.5 mg via ORAL
  Filled 2019-10-30: qty 2

## 2019-10-30 MED ORDER — FOLIC ACID 5 MG/ML IJ SOLN
1.0000 mg | Freq: Every day | INTRAMUSCULAR | Status: DC
  Administered 2019-10-30 – 2019-10-31 (×2): 1 mg via INTRAVENOUS
  Filled 2019-10-30 (×2): qty 0.2

## 2019-10-30 MED ORDER — SODIUM CHLORIDE 0.9 % IV SOLN
100.0000 mg | INTRAVENOUS | Status: AC
  Administered 2019-10-31 – 2019-11-03 (×4): 100 mg via INTRAVENOUS
  Filled 2019-10-30 (×5): qty 20

## 2019-10-30 MED ORDER — HYDROCODONE-ACETAMINOPHEN 7.5-325 MG PO TABS
1.0000 | ORAL_TABLET | ORAL | Status: DC | PRN
  Administered 2019-10-30 – 2019-11-10 (×27): 1 via ORAL
  Filled 2019-10-30 (×27): qty 1

## 2019-10-30 MED ORDER — ASPIRIN 81 MG PO CHEW
81.0000 mg | CHEWABLE_TABLET | Freq: Every day | ORAL | Status: DC
  Administered 2019-10-30 – 2019-11-10 (×13): 81 mg via ORAL
  Filled 2019-10-30 (×13): qty 1

## 2019-10-30 MED ORDER — INSULIN ASPART 100 UNIT/ML ~~LOC~~ SOLN
0.0000 [IU] | SUBCUTANEOUS | Status: DC
  Administered 2019-10-30 (×3): 20 [IU] via SUBCUTANEOUS
  Administered 2019-10-31: 4 [IU] via SUBCUTANEOUS
  Administered 2019-10-31 (×2): 11 [IU] via SUBCUTANEOUS
  Administered 2019-10-31: 20 [IU] via SUBCUTANEOUS
  Administered 2019-10-31: 7 [IU] via SUBCUTANEOUS
  Administered 2019-10-31: 15 [IU] via SUBCUTANEOUS
  Administered 2019-11-01: 7 [IU] via SUBCUTANEOUS
  Administered 2019-11-01: 11 [IU] via SUBCUTANEOUS
  Administered 2019-11-01: 15 [IU] via SUBCUTANEOUS
  Administered 2019-11-01 (×2): 11 [IU] via SUBCUTANEOUS
  Administered 2019-11-01 – 2019-11-02 (×4): 7 [IU] via SUBCUTANEOUS
  Administered 2019-11-02 (×2): 11 [IU] via SUBCUTANEOUS
  Administered 2019-11-02: 15 [IU] via SUBCUTANEOUS
  Administered 2019-11-03: 14:00:00 20 [IU] via SUBCUTANEOUS
  Administered 2019-11-03 (×5): 7 [IU] via SUBCUTANEOUS
  Administered 2019-11-04: 15 [IU] via SUBCUTANEOUS
  Administered 2019-11-04: 7 [IU] via SUBCUTANEOUS
  Administered 2019-11-04: 3 [IU] via SUBCUTANEOUS
  Administered 2019-11-04: 7 [IU] via SUBCUTANEOUS
  Administered 2019-11-04 (×2): 4 [IU] via SUBCUTANEOUS
  Administered 2019-11-05: 11 [IU] via SUBCUTANEOUS
  Administered 2019-11-05: 15 [IU] via SUBCUTANEOUS
  Administered 2019-11-05: 4 [IU] via SUBCUTANEOUS
  Administered 2019-11-05: 11 [IU] via SUBCUTANEOUS
  Administered 2019-11-05 (×2): 4 [IU] via SUBCUTANEOUS
  Administered 2019-11-06: 11 [IU] via SUBCUTANEOUS
  Administered 2019-11-06 (×2): 7 [IU] via SUBCUTANEOUS
  Administered 2019-11-06 – 2019-11-07 (×4): 11 [IU] via SUBCUTANEOUS
  Administered 2019-11-07: 4 [IU] via SUBCUTANEOUS
  Administered 2019-11-07: 15 [IU] via SUBCUTANEOUS
  Administered 2019-11-07 (×2): 11 [IU] via SUBCUTANEOUS
  Administered 2019-11-07: 4 [IU] via SUBCUTANEOUS
  Administered 2019-11-08: 7 [IU] via SUBCUTANEOUS
  Administered 2019-11-08: 3 [IU] via SUBCUTANEOUS
  Administered 2019-11-08: 11 [IU] via SUBCUTANEOUS
  Administered 2019-11-08: 3 [IU] via SUBCUTANEOUS
  Administered 2019-11-08: 4 [IU] via SUBCUTANEOUS
  Administered 2019-11-09: 3 [IU] via SUBCUTANEOUS
  Administered 2019-11-09: 4 [IU] via SUBCUTANEOUS
  Administered 2019-11-09: 3 [IU] via SUBCUTANEOUS
  Administered 2019-11-10: 7 [IU] via SUBCUTANEOUS

## 2019-10-30 MED ORDER — FLUTICASONE PROPIONATE 50 MCG/ACT NA SUSP
2.0000 | Freq: Three times a day (TID) | NASAL | Status: DC
  Administered 2019-10-30 – 2019-11-09 (×18): 2 via NASAL
  Filled 2019-10-30 (×3): qty 16

## 2019-10-30 MED ORDER — DEXAMETHASONE SODIUM PHOSPHATE 10 MG/ML IJ SOLN
6.0000 mg | INTRAMUSCULAR | Status: AC
  Administered 2019-10-30 – 2019-11-08 (×10): 6 mg via INTRAVENOUS
  Filled 2019-10-30 (×10): qty 1

## 2019-10-30 NOTE — Progress Notes (Signed)
Inpatient Diabetes Program Recommendations  AACE/ADA: New Consensus Statement on Inpatient Glycemic Control   Target Ranges:  Prepandial:   less than 140 mg/dL      Peak postprandial:   less than 180 mg/dL (1-2 hours)      Critically ill patients:  140 - 180 mg/dL   Results for Reginald Duncan, Reginald Duncan (MRN [id number]) as of 10/30/2019 11:34  Ref. Range 10/29/2019 18:00 10/29/2019 18:15 10/29/2019 18:58 10/29/2019 19:42 10/29/2019 20:36 10/29/2019 21:20 10/29/2019 23:38 10/30/2019 01:30 10/30/2019 02:39 10/30/2019 07:26  Glucose-Capillary Latest Ref Range: 70 - 99 mg/dL 15 (LL) 85 40 (LL) 49 (L) 95 67 (L) 73 55 (L) 125 (H) 322 (H)   Review of Glycemic Control  Diabetes history: DM2 Outpatient Diabetes medications: Humulin R U500 30 units BID (uses a U100 insulin syringe, dose would be 150 units of U500) Current orders for Inpatient glycemic control: None; Decadron 6 mg Q24H  Inpatient Diabetes Program Recommendations:   Insulin-Basal: Noted patient initially hypoglycemic and glucose up to 322 mg/dl this morning and now up to 431 mg/dl. Please consider ordering Lantus 50 units Q24H starting now. If Lantus is given as recommended and glucose still significantly elevated, will need to increase Lantus further.  Insulin-Correction: Please consider ordering CBGs Q4H with Novolog 0-20 units Q4H.  NOTE: In reviewing chart, noted patient was recently inpatient 10/09/19 to 10/17/19. Inpatient Diabetes Coordinator talked with patient on 10/10/19 and per note, pharmacist reported that patient was taking Humulin R U500 with regular U100 insulin syringe and drawing up to 10-15 units.  Per current home medication list, patient reports taking Humulin R U500 15 units with lunch (which would actually be 75 units of U500 if patient is still drawing up in U100 insulin syringe).  Spoke with patient over the phone. Patient reports that he is taking Humulin R U500 at home using a U100 syringe. Patient reports he was given the U500 insulin syringe  but he felt he was not getting the insulin because when he used that syringe it did not seem to bring his glucose down. Patient states that he typically take U500 30 units twice a day. Patient reports that over the past few days he has not had an appetite and has not been able to eat but he has continued to take the U500 as glucose has been elevated. Patient reports that his glucose was 437 mg/dl yesterday at home and he took U500 30 units. Patient reports that he has not had any issues with severe hypoglycemia like this since April. Explained that hypoglycemia likely due to taking U500 and not eating. Patient reports that his glucose was just recently checked and it was 431 mg/dl and that he is currently eating lunch (collards, [place], and sweet potatoes).  Patient reports that his appetite is better now that he can breathe easier. Informed patient that it would be requested to order Lantus and Novolog correction if MD agreeable and that if he continues to improve and eat well, U500 may be ordered if hyperglycemia persist with Lantus and Novolog correction. Patient verbalized understanding of information and states that he does not have any questions at this time.   Thanks, Barnie Alderman, RN, MSN, CDE Diabetes Coordinator Inpatient Diabetes Program [phone] (Team Pager from 8am to 5pm)

## 2019-10-30 NOTE — ED Notes (Signed)
Pt ambulated to bathroom. Pt became SOB, O2 increased to 5L [place] for comfort. PT back in bed, will continue to monitor

## 2019-10-30 NOTE — ED Notes (Addendum)
Received verbal order from admitting MD Obgata to administer 8 units SQ novolog for CBG readings >300. novolog 8 units administered, verified with staff correct units.

## 2019-10-30 NOTE — Progress Notes (Signed)
PROGRESS NOTE    Reginald Duncan    Code Status: Full Code  [id number] DOB: [birth year] DOA: 10/29/2019  PCP: [name], PA    Hospital Summary  This is a 69 year old male, history of obesity, CAD, COPD, OSA, CKD 3, chronic pain, PVD, diabetes, hypertension, CVA, hyperlipidemia, anxiety who presented with worsening shortness of breath times several weeks and new onset chest pain with associated wheezing and fever found to be Covid positive, febrile and tachycardic in ED as well as hypoxic requiring nasal cannula O2 and started on Decadron and remdesivir.  CRP noted to be elevated and patient started on Actemra x1 after risk/benefit discussion with patient on 1/7.  Also started on Ativan 1 mg as needed twice daily for severe anxiety.  Patient was offered to be transferred to [facility] however stated he wished to stay at [facility] for further treatment  A & P   Active Problems:   Acute on chronic respiratory failure ([place])    1. Acute hypoxic respiratory failure secondary to COVID-19 a. In setting of COPD on room air at baseline, morbidly obese, OSA. b. Currently requiring 5 L nasal cannula c. CRP 23 d. Continue remdesivir x5 days e. Continue Decadron x10 days on insulin per Covid protocol f. Actemra x1 g. Continue to follow inflammatory markers h. Does not wish to be transferred to [facility] 2. COPD a. Possible mild exacerbation b. Continue IV steroids and inhalers 3. Morbid obesity a. Outpatient management 4. Severe anxiety a. Tearful during exam b. On Xanax outpatient c. Ativan 1 mg as needed twice daily, hold Xanax 5. Hypoglycemia a. Received D5 overnight, now hyperglycemic in setting of steroids 6. Diabetes with hyperglycemia a. HA1C 10.0 b. Insulin per Covid protocol c. Diabetic coordinator consulted 7. OSA a. CPAP nightly 8. Chest pain, unknown etiology suspect secondary to COVID-19.  Resolved a. Continue telemetry 9. Hypertension a. Continue amlodipine and  carvedilol 10. CAD a. Status post left heart cath 09/01/2019 b. Continue dual antiplatelets and statin    DVT prophylaxis: Lovenox Diet: Carb modified Family Communication: Patient updated at bedside Disposition Plan: Barrier to discharge is medical stability, continue discharge planning  Consultants  None  Procedures  None  Antibiotics   Anti-infectives (From admission, onward)   Start     Dose/Rate Route Frequency Ordered Stop   10/31/19 1000  remdesivir 100 mg in sodium chloride 0.9 % 100 mL IVPB  Status:  Discontinued     100 mg 200 mL/hr over 30 Minutes Intravenous Daily 10/30/19 0058 10/30/19 0154   10/31/19 1000  remdesivir 100 mg in sodium chloride 0.9 % 100 mL IVPB     100 mg 200 mL/hr over 30 Minutes Intravenous Every 24 hours 10/30/19 0837 11/04/19 0959   10/30/19 2200  remdesivir 100 mg in sodium chloride 0.9 % 100 mL IVPB  Status:  Discontinued     100 mg 200 mL/hr over 30 Minutes Intravenous Daily 10/29/19 2257 10/30/19 0835   10/30/19 0200  azithromycin (ZITHROMAX) 500 mg in sodium chloride 0.9 % 250 mL IVPB     500 mg 250 mL/hr over 60 Minutes Intravenous Every 24 hours 10/30/19 0058     10/30/19 0100  remdesivir 200 mg in sodium chloride 0.9% 250 mL IVPB  Status:  Discontinued     200 mg 580 mL/hr over 30 Minutes Intravenous Once 10/30/19 0058 10/30/19 0404   10/29/19 2300  remdesivir 200 mg in sodium chloride 0.9% 250 mL IVPB  Status:  Discontinued  200 mg 580 mL/hr over 30 Minutes Intravenous Once 10/29/19 2257 10/30/19 0835           Subjective   Seen and examined at bedside in the ED.  He was very tearful and anxious at being diagnosed with COVID-19 and is very worried about his prognosis.  I was able to reassure the patient.  States his chest pain has resolved and shortness of breath has improved.  Reports he does not use oxygen at home.  Has had shortness of breath for some time  Objective   Vitals:   10/30/19 1615 10/30/19 1630 10/30/19  1645 10/30/19 1700  BP: 134/87 131/88 (!) 131/91 (!) 141/89  Pulse: 80 76 75 78  Resp:      Temp:      TempSrc:      SpO2: 97% 98% 98% 97%    Intake/Output Summary (Last 24 hours) at 10/30/2019 1715 Last data filed at 10/30/2019 1305 Gross per 24 hour  Intake 100 ml  Output 1350 ml  Net -1250 ml   There were no vitals filed for this visit.  Examination:  Physical Exam Vitals and nursing note reviewed.  Constitutional:      Appearance: He is obese. He is not ill-appearing.  HENT:     Head: Normocephalic.  Cardiovascular:     Rate and Rhythm: Normal rate and regular rhythm.  Pulmonary:     Effort: Pulmonary effort is normal. No tachypnea.  Chest:     Chest wall: No tenderness.  Musculoskeletal:     Cervical back: Normal range of motion.     Right lower leg: No edema.     Left lower leg: No edema.  Neurological:     General: No focal deficit present.     Mental Status: He is alert.  Psychiatric:        Mood and Affect: Mood is anxious.     Data Reviewed: I have personally reviewed following labs and imaging studies  CBC: Recent Labs  Lab 10/29/19 1955 10/30/19 0059 10/30/19 0451  WBC 9.2 6.6 5.1  NEUTROABS 7.8*  --  4.5  HGB 13.9 13.1 12.0*  HCT 42.4 39.6 37.3*  MCV 91.8 92.7 93.5  PLT 136* 126* [id number]*   Basic Metabolic Panel: Recent Labs  Lab 10/29/19 1955 10/30/19 0059 10/30/19 0451  NA 138  --   --   K 3.6  --   --   CL 94*  --   --   CO2 27  --   --   GLUCOSE 167*  --   --   BUN 43*  --   --   CREATININE 1.53* 1.33*  --   CALCIUM 9.2  --   --   MG  --   --  1.9  PHOS  --   --  4.0   GFR: Estimated Creatinine Clearance: 77.8 mL/min (A) (by C-G formula based on SCr of 1.33 mg/dL (H)). Liver Function Tests: Recent Labs  Lab 10/29/19 1955  AST 34  ALT 46*  ALKPHOS 47  BILITOT 1.0  PROT 6.4*  ALBUMIN 2.5*   No results for input(s): LIPASE, AMYLASE in the last 168 hours. No results for input(s): AMMONIA in the last 168 hours. Coagulation  Profile: No results for input(s): INR, PROTIME in the last 168 hours. Cardiac Enzymes: No results for input(s): CKTOTAL, CKMB, CKMBINDEX, TROPONINI in the last 168 hours. BNP (last 3 results) No results for input(s): PROBNP in the last 8760 hours. HbA1C: No  results for input(s): HGBA1C in the last 72 hours. CBG: Recent Labs  Lab 10/30/19 0130 10/30/19 0239 10/30/19 0726 10/30/19 1304 10/30/19 1611  GLUCAP 55* 125* 322* 431* 433*   Lipid Profile: No results for input(s): CHOL, HDL, LDLCALC, TRIG, CHOLHDL, LDLDIRECT in the last 72 hours. Thyroid Function Tests: No results for input(s): TSH, T4TOTAL, FREET4, T3FREE, THYROIDAB in the last 72 hours. Anemia Panel: Recent Labs    10/30/19 0451  FERRITIN 353*   Sepsis Labs: No results for input(s): PROCALCITON, LATICACIDVEN in the last 168 hours.  No results found for this or any previous visit (from the past 240 hour(s)).       Radiology Studies: DG Chest Port 1 View  Result Date: 10/29/2019 CLINICAL DATA:  Shortness of breath and COPD EXAM: PORTABLE CHEST 1 VIEW COMPARISON:  10/09/2019 FINDINGS: Cardiac shadow is enlarged but accentuated by the frontal technique. The lungs are well aerated bilaterally. Bilateral atelectatic changes are noted left slightly greater than right when compared with the prior exam. No bony abnormality is noted. IMPRESSION: Bibasilar atelectasis left greater than right. Electronically Signed   By: Inez Catalina M.D.   On: 10/29/2019 18:25        Scheduled Meds: . albuterol  2 puff Inhalation Q4H  . amLODipine  2.5 mg Oral QHS  . vitamin C  500 mg Oral Daily  . aspirin  81 mg Oral QHS  . carvedilol  50 mg Oral BID  . clopidogrel  75 mg Oral Daily  . dexamethasone (DECADRON) injection  6 mg Intravenous Q24H  . enoxaparin (LOVENOX) injection  40 mg Subcutaneous Q24H  . famotidine  20 mg Oral QHS  . fluticasone  2 spray Each Nare TID  . folic acid  1 mg Intravenous Daily  . icosapent Ethyl  2 g  Oral TID  . insulin aspart  0-20 Units Subcutaneous Q4H  . insulin detemir  0.15 Units/kg Subcutaneous BID  . multivitamin with minerals  1 tablet Oral Daily  . pregabalin  75-150 mg Oral See admin instructions  . QUEtiapine  300 mg Oral QHS  . rosuvastatin  20 mg Oral Daily  . sodium chloride flush  3 mL Intravenous Q12H  . [START ON 11/03/2019] Vitamin D (Ergocalciferol)  50,000 Units Oral Q Mon  . zinc sulfate  220 mg Oral Daily   Continuous Infusions: . sodium chloride    . azithromycin Stopped (10/30/19 0537)  . [START ON 10/31/2019] remdesivir 100 mg in NS 100 mL       LOS: 1 day    Time spent: 33 minutes with over 50% of the time coordinating the patient's care    Harold Hedge, DO [facility] Pager [phone]  If 7PM-7AM, please contact night-coverage [email or web address] Password [facility] 10/30/2019, 5:15 PM

## 2019-10-30 NOTE — ED Notes (Signed)
Per SS insulin order, MD was paged for CBG 431. Verbal order to give 20 units novolog

## 2019-10-30 NOTE — ED Notes (Signed)
Patient awake and alert when this RN entered the room this AM, requesting food, states is very hungry. CBG checked - 322 this AM, D51/2NS paused and admitting team paged for further recommendations. His oxygen was removed from his face when I entered, he reports he feels like he is having trouble breathing, O2 89%, cannula replaced and O2 improved.

## 2019-10-31 ENCOUNTER — Telehealth: Payer: Medicare HMO | Admitting: Physician Assistant

## 2019-10-31 ENCOUNTER — Inpatient Hospital Stay (HOSPITAL_COMMUNITY): Payer: Medicare HMO

## 2019-10-31 DIAGNOSIS — F418 Other specified anxiety disorders: Secondary | ICD-10-CM

## 2019-10-31 DIAGNOSIS — R079 Chest pain, unspecified: Secondary | ICD-10-CM

## 2019-10-31 LAB — CBC WITH DIFFERENTIAL/PLATELET
Abs Immature Granulocytes: 0.06 10*3/uL (ref 0.00–0.07)
Basophils Absolute: 0 10*3/uL (ref 0.0–0.1)
Basophils Relative: 0 %
Eosinophils Absolute: 0 10*3/uL (ref 0.0–0.5)
Eosinophils Relative: 0 %
HCT: 34.5 % — ABNORMAL LOW (ref 39.0–52.0)
Hemoglobin: 11.6 g/dL — ABNORMAL LOW (ref 13.0–17.0)
Immature Granulocytes: 1 %
Lymphocytes Relative: 11 %
Lymphs Abs: 0.5 10*3/uL — ABNORMAL LOW (ref 0.7–4.0)
MCH: 30.5 pg (ref 26.0–34.0)
MCHC: 33.6 g/dL (ref 30.0–36.0)
MCV: 90.8 fL (ref 80.0–100.0)
Monocytes Absolute: 0.2 10*3/uL (ref 0.1–1.0)
Monocytes Relative: 4 %
Neutro Abs: 3.7 10*3/uL (ref 1.7–7.7)
Neutrophils Relative %: 84 %
Platelets: 111 10*3/uL — ABNORMAL LOW (ref 150–400)
RBC: 3.8 MIL/uL — ABNORMAL LOW (ref 4.22–5.81)
RDW: 16 % — ABNORMAL HIGH (ref 11.5–15.5)
WBC: 4.4 10*3/uL (ref 4.0–10.5)
nRBC: 0 % (ref 0.0–0.2)

## 2019-10-31 LAB — COMPREHENSIVE METABOLIC PANEL
ALT: 53 U/L — ABNORMAL HIGH (ref 0–44)
AST: 40 U/L (ref 15–41)
Albumin: 2.2 g/dL — ABNORMAL LOW (ref 3.5–5.0)
Alkaline Phosphatase: 47 U/L (ref 38–126)
Anion gap: 12 (ref 5–15)
BUN: 45 mg/dL — ABNORMAL HIGH (ref 8–23)
CO2: 26 mmol/L (ref 22–32)
Calcium: 8.7 mg/dL — ABNORMAL LOW (ref 8.9–10.3)
Chloride: 95 mmol/L — ABNORMAL LOW (ref 98–111)
Creatinine, Ser: 1.28 mg/dL — ABNORMAL HIGH (ref 0.61–1.24)
GFR calc Af Amer: >60 mL/min (ref >60)
GFR calc non Af Amer: 58 mL/min — ABNORMAL LOW (ref >60)
Glucose, Bld: 351 mg/dL — ABNORMAL HIGH (ref 70–99)
Potassium: 4.4 mmol/L (ref 3.5–5.1)
Sodium: 133 mmol/L — ABNORMAL LOW (ref 135–145)
Total Bilirubin: 0.5 mg/dL (ref 0.3–1.2)
Total Protein: 5.9 g/dL — ABNORMAL LOW (ref 6.5–8.1)

## 2019-10-31 LAB — CBG MONITORING, ED: Glucose-Capillary: 254 mg/dL — ABNORMAL HIGH (ref 70–99)

## 2019-10-31 LAB — PHOSPHORUS: Phosphorus: 3.9 mg/dL (ref 2.5–4.6)

## 2019-10-31 LAB — FERRITIN: Ferritin: 480 ng/mL — ABNORMAL HIGH (ref 24–336)

## 2019-10-31 LAB — MAGNESIUM: Magnesium: 2.1 mg/dL (ref 1.7–2.4)

## 2019-10-31 LAB — D-DIMER, QUANTITATIVE: D-Dimer, Quant: 0.32 ug/mL-FEU (ref 0.00–0.50)

## 2019-10-31 LAB — GLUCOSE, CAPILLARY
Glucose-Capillary: 204 mg/dL — ABNORMAL HIGH (ref 70–99)
Glucose-Capillary: 168 mg/dL — ABNORMAL HIGH (ref 70–99)
Glucose-Capillary: 350 mg/dL — ABNORMAL HIGH (ref 70–99)
Glucose-Capillary: 375 mg/dL — ABNORMAL HIGH (ref 70–99)
Glucose-Capillary: 327 mg/dL — ABNORMAL HIGH (ref 70–99)

## 2019-10-31 LAB — C-REACTIVE PROTEIN: CRP: 14.8 mg/dL — ABNORMAL HIGH (ref <1.0)

## 2019-10-31 MED ORDER — IOHEXOL 350 MG/ML SOLN
75.0000 mL | Freq: Once | INTRAVENOUS | Status: AC | PRN
  Administered 2019-10-31: 75 mL via INTRAVENOUS

## 2019-10-31 MED ORDER — INSULIN DETEMIR 100 UNIT/ML ~~LOC~~ SOLN
25.0000 [IU] | Freq: Two times a day (BID) | SUBCUTANEOUS | Status: DC
  Administered 2019-10-31 – 2019-11-01 (×2): 25 [IU] via SUBCUTANEOUS
  Filled 2019-10-31 (×3): qty 0.25

## 2019-10-31 MED ORDER — FOLIC ACID 1 MG PO TABS
1.0000 mg | ORAL_TABLET | Freq: Every day | ORAL | Status: DC
  Administered 2019-11-01 – 2019-11-10 (×10): 1 mg via ORAL
  Filled 2019-10-31 (×10): qty 1

## 2019-10-31 MED ORDER — PREGABALIN 75 MG PO CAPS
75.0000 mg | ORAL_CAPSULE | Freq: Every day | ORAL | Status: DC
  Administered 2019-10-31 – 2019-11-03 (×4): 75 mg via ORAL
  Filled 2019-10-31 (×4): qty 1

## 2019-10-31 MED ORDER — ENOXAPARIN SODIUM 80 MG/0.8ML ~~LOC~~ SOLN
0.5000 mg/kg | SUBCUTANEOUS | Status: DC
  Administered 2019-11-01 – 2019-11-10 (×10): 65 mg via SUBCUTANEOUS
  Filled 2019-10-31 (×10): qty 0.8

## 2019-10-31 MED ORDER — PREGABALIN 75 MG PO CAPS
150.0000 mg | ORAL_CAPSULE | Freq: Every day | ORAL | Status: DC
  Administered 2019-10-31: 150 mg via ORAL
  Filled 2019-10-31: qty 2

## 2019-10-31 MED ORDER — ALPRAZOLAM 0.5 MG PO TABS
0.5000 mg | ORAL_TABLET | Freq: Two times a day (BID) | ORAL | Status: DC | PRN
  Administered 2019-10-31 – 2019-11-10 (×16): 0.5 mg via ORAL
  Filled 2019-10-31 (×18): qty 1

## 2019-10-31 MED ORDER — INSULIN ASPART 100 UNIT/ML ~~LOC~~ SOLN
4.0000 [IU] | Freq: Three times a day (TID) | SUBCUTANEOUS | Status: DC
  Administered 2019-10-31 – 2019-11-01 (×3): 4 [IU] via SUBCUTANEOUS

## 2019-10-31 MED ORDER — GUAIFENESIN-DM 100-10 MG/5ML PO SYRP
5.0000 mL | ORAL_SOLUTION | ORAL | Status: DC | PRN
  Administered 2019-11-04 – 2019-11-09 (×5): 5 mL via ORAL
  Filled 2019-10-31 (×5): qty 5

## 2019-10-31 NOTE — Plan of Care (Signed)

## 2019-10-31 NOTE — Progress Notes (Signed)
Inpatient Diabetes Program Recommendations  AACE/ADA: New Consensus Statement on Inpatient Glycemic Control (2015)  Target Ranges:  Prepandial:   less than 140 mg/dL      Peak postprandial:   less than 180 mg/dL (1-2 hours)      Critically ill patients:  140 - 180 mg/dL   Lab Results  Component Value Date   GLUCAP 168 (H) 10/31/2019   HGBA1C 10.0 (H) 10/11/2019    Review of Glycemic Control Results for BABE, KOLENOVIC (MRN [id number]) as of 10/31/2019 09:56  Ref. Range 10/30/2019 16:11 10/30/2019 20:03 10/31/2019 00:19 10/31/2019 03:37 10/31/2019 07:49  Glucose-Capillary Latest Ref Range: 70 - 99 mg/dL 433 (H) 370 (H) 254 (H) 204 (H) 168 (H)   Diabetes history: DM2 Outpatient Diabetes medications: Humulin R U500 30 units BID (uses a U100 insulin syringe, dose would be 150 units of U500) Current orders for Inpatient glycemic control: None; Decadron 6 mg Q24H Decadron 8 mg, 10 mg , 6 mg  Inpatient Diabetes Program Recommendations:   Patient has received >80 units of short acting in last 18 hours, assuming related partly to Decadron.   Consider adding Novolog 4 units TID (assuming patient is consuming >50% of meals).  Increase Levemir to 25 units BID.  Thanks, Bronson Curb, MSN, RNC-OB Diabetes Coordinator [phone] (8a-5p)

## 2019-10-31 NOTE — Progress Notes (Addendum)
PROGRESS NOTE    Reginald Duncan    Code Status: Full Code  [id number] DOB: 08-Jan-1951 DOA: 10/29/2019  PCP: [name], PA    Hospital Summary  This is a 69 year old male, history of obesity, CAD, COPD, OSA, CKD 3a, chronic pain, PVD, diabetes, hypertension, CVA, hyperlipidemia, anxiety who presented with worsening shortness of breath times several weeks and new onset chest pain with associated wheezing and fever found to be Covid positive, febrile and tachycardic in ED as well as hypoxic requiring nasal cannula O2 and started on Decadron and remdesivir.  CRP noted to be elevated and patient started on Actemra x1 after risk/benefit discussion with patient on 1/7.  Also started on Ativan 1 mg as needed twice daily for severe anxiety.  Patient was offered to be transferred to [facility] however stated he wished to stay at [facility] for further treatment  A & P   Active Problems:   Acute on chronic respiratory failure ([place])    1. Acute hypoxic respiratory failure secondary to COVID-19, In setting of COPD, morbidly obese, OSA a. Still requiring 5 L nasal cannula b. CRP 23->14.8 post Actemra 1/7 x 1 c. Continue remdesivir x5 days d. Continue Decadron x10 days on insulin per Covid protocol e. Received 2 days azithromycin, can discontinue f. Continue to follow inflammatory markers g. Does not wish to be transferred to [facility] 2. COPD a. Possible mild exacerbation b. Continue IV steroids and inhalers, discontinue azithromycin 3. Morbid obesity a. Outpatient management 4. Severe anxiety over healthcare a. Change Ativan to twice daily as needed Xanax per patient request 5. Hypoglycemia a. Resolved with D5W, now hyperglycemic in setting of steroids 6. Diabetes with hyperglycemia a. HA1C 10.0 b. Diabetic coordinator consulted: Add NovoLog 4 units 3 times daily with meals and increase Levemir to 25 units twice daily 7. OSA a. CPAP nightly 8. CKD 3a stable 9. Chest pain: More likely MSK  vs anxiety, less likely PE a. Well Score: 0, low risk, PERC: cannot rule out PE, D Dimer negative, however increased O2 requirements and pain on inspiration b. CTA chest 10. Hypertension a. Continue amlodipine and carvedilol 11. CAD a. Status post left heart cath 09/01/2019 b. Continue dual antiplatelets and statin    DVT prophylaxis: Lovenox Diet: Carb modified Family Communication: discussed with brother over the phone Disposition Plan: Barrier to discharge is medical stability, continue discharge planning  Consultants  None  Procedures  None  Antibiotics   Anti-infectives (From admission, onward)   Start     Dose/Rate Route Frequency Ordered Stop   10/31/19 1000  remdesivir 100 mg in sodium chloride 0.9 % 100 mL IVPB  Status:  Discontinued     100 mg 200 mL/hr over 30 Minutes Intravenous Daily 10/30/19 0058 10/30/19 0154   10/31/19 1000  remdesivir 100 mg in sodium chloride 0.9 % 100 mL IVPB     100 mg 200 mL/hr over 30 Minutes Intravenous Every 24 hours 10/30/19 0837 11/04/19 0959   10/30/19 2200  remdesivir 100 mg in sodium chloride 0.9 % 100 mL IVPB  Status:  Discontinued     100 mg 200 mL/hr over 30 Minutes Intravenous Daily 10/29/19 2257 10/30/19 0835   10/30/19 0200  azithromycin (ZITHROMAX) 500 mg in sodium chloride 0.9 % 250 mL IVPB     500 mg 250 mL/hr over 60 Minutes Intravenous Every 24 hours 10/30/19 0058     10/30/19 0100  remdesivir 200 mg in sodium chloride 0.9% 250 mL IVPB  Status:  Discontinued     200 mg 580 mL/hr over 30 Minutes Intravenous Once 10/30/19 0058 10/30/19 0404   10/29/19 2300  remdesivir 200 mg in sodium chloride 0.9% 250 mL IVPB  Status:  Discontinued     200 mg 580 mL/hr over 30 Minutes Intravenous Once 10/29/19 2257 10/30/19 0835           Subjective   Seen and examined at bedside.  Patient had just received some pain medication.  Requesting to change Ativan to his home Xanax regimen as this controls his anxiety better, has  severe anxiety due to his health concerns.  Admits to persistent substernal chest pain with inspiration.    Objective   Vitals:   10/31/19 1150 10/31/19 1200 10/31/19 1400 10/31/19 1600  BP: 118/80 106/74 (!) 101/53 108/71  Pulse: 68 69 71 66  Resp: 13  19 16   Temp:   97.7 F (36.5 C)   TempSrc:   Oral   SpO2:      Weight:        Intake/Output Summary (Last 24 hours) at 10/31/2019 1640 Last data filed at 10/31/2019 1500 Gross per 24 hour  Intake --  Output 1400 ml  Net -1400 ml   Filed Weights   10/31/19 0258  Weight: 134.7 kg    Examination:  Physical Exam Vitals and nursing note reviewed.  Constitutional:      General: He is not in acute distress.    Appearance: He is ill-appearing.  HENT:     Head: Normocephalic.  Eyes:     Extraocular Movements: Extraocular movements intact.  Cardiovascular:     Rate and Rhythm: Normal rate.  Pulmonary:     Effort: Pulmonary effort is normal. No accessory muscle usage.     Comments: 5 L nasal cannula Chest:     Comments: Substernal/left-sided chest pain with deep inspiration Abdominal:     Palpations: Abdomen is soft.  Musculoskeletal:     Right lower leg: No edema.     Left lower leg: No edema.  Neurological:     Mental Status: He is alert and oriented to person, place, and time.  Psychiatric:        Mood and Affect: Mood is anxious.     Data Reviewed: I have personally reviewed following labs and imaging studies  CBC: Recent Labs  Lab 10/29/19 1955 10/30/19 0059 10/30/19 0451 10/31/19 0733  WBC 9.2 6.6 5.1 4.4  NEUTROABS 7.8*  --  4.5 3.7  HGB 13.9 13.1 12.0* 11.6*  HCT 42.4 39.6 37.3* 34.5*  MCV 91.8 92.7 93.5 90.8  PLT 136* 126* 104* [id number]*   Basic Metabolic Panel: Recent Labs  Lab 10/29/19 1955 10/30/19 0059 10/30/19 0451 10/31/19 0733 10/31/19 1322  NA 138  --   --   --  133*  K 3.6  --   --   --  4.4  CL 94*  --   --   --  95*  CO2 27  --   --   --  26  GLUCOSE 167*  --   --   --  351*  BUN  43*  --   --   --  45*  CREATININE 1.53* 1.33*  --   --  1.28*  CALCIUM 9.2  --   --   --  8.7*  MG  --   --  1.9 2.1  --   PHOS  --   --  4.0 3.9  --  GFR: Estimated Creatinine Clearance: 80.6 mL/min (A) (by C-G formula based on SCr of 1.28 mg/dL (H)). Liver Function Tests: Recent Labs  Lab 10/29/19 1955 10/31/19 1322  AST 34 40  ALT 46* 53*  ALKPHOS 47 47  BILITOT 1.0 0.5  PROT 6.4* 5.9*  ALBUMIN 2.5* 2.2*   No results for input(s): LIPASE, AMYLASE in the last 168 hours. No results for input(s): AMMONIA in the last 168 hours. Coagulation Profile: No results for input(s): INR, PROTIME in the last 168 hours. Cardiac Enzymes: No results for input(s): CKTOTAL, CKMB, CKMBINDEX, TROPONINI in the last 168 hours. BNP (last 3 results) No results for input(s): PROBNP in the last 8760 hours. HbA1C: No results for input(s): HGBA1C in the last 72 hours. CBG: Recent Labs  Lab 10/30/19 1611 10/30/19 2003 10/31/19 0019 10/31/19 0337 10/31/19 0749  GLUCAP 433* 370* 254* 204* 168*   Lipid Profile: No results for input(s): CHOL, HDL, LDLCALC, TRIG, CHOLHDL, LDLDIRECT in the last 72 hours. Thyroid Function Tests: No results for input(s): TSH, T4TOTAL, FREET4, T3FREE, THYROIDAB in the last 72 hours. Anemia Panel: Recent Labs    10/30/19 0451 10/31/19 0733  FERRITIN 353* 480*   Sepsis Labs: No results for input(s): PROCALCITON, LATICACIDVEN in the last 168 hours.  No results found for this or any previous visit (from the past 240 hour(s)).       Radiology Studies: DG Chest Port 1 View  Result Date: 10/29/2019 CLINICAL DATA:  Shortness of breath and COPD EXAM: PORTABLE CHEST 1 VIEW COMPARISON:  10/09/2019 FINDINGS: Cardiac shadow is enlarged but accentuated by the frontal technique. The lungs are well aerated bilaterally. Bilateral atelectatic changes are noted left slightly greater than right when compared with the prior exam. No bony abnormality is noted. IMPRESSION:  Bibasilar atelectasis left greater than right. Electronically Signed   By: Inez Catalina M.D.   On: 10/29/2019 18:25        Scheduled Meds: . albuterol  2 puff Inhalation Q4H  . amLODipine  2.5 mg Oral QHS  . vitamin C  500 mg Oral Daily  . aspirin  81 mg Oral QHS  . carvedilol  50 mg Oral BID  . clopidogrel  75 mg Oral Daily  . dexamethasone (DECADRON) injection  6 mg Intravenous Q24H  . [START ON 11/01/2019] enoxaparin (LOVENOX) injection  0.5 mg/kg Subcutaneous Q24H  . famotidine  20 mg Oral QHS  . fluticasone  2 spray Each Nare TID  . [START ON 0000000 folic acid  1 mg Oral Daily  . icosapent Ethyl  2 g Oral TID  . insulin aspart  0-20 Units Subcutaneous Q4H  . insulin detemir  0.15 Units/kg Subcutaneous BID  . multivitamin with minerals  1 tablet Oral Daily  . pregabalin  150 mg Oral QHS  . pregabalin  75 mg Oral Daily  . QUEtiapine  300 mg Oral QHS  . rosuvastatin  20 mg Oral Daily  . sodium chloride flush  3 mL Intravenous Q12H  . [START ON 11/03/2019] Vitamin D (Ergocalciferol)  50,000 Units Oral Q Mon  . zinc sulfate  220 mg Oral Daily   Continuous Infusions: . sodium chloride    . azithromycin 500 mg (10/31/19 0202)  . remdesivir 100 mg in NS 100 mL 100 mg (10/31/19 1121)     LOS: 2 days    Time spent: 30 minutes with over 50% of the time coordinating the patient's care    Harold Hedge, DO [facility] Pager [phone]  If 7PM-7AM, please contact night-coverage [email or web address] Password [facility] 10/31/2019, 4:40 PM

## 2019-10-31 NOTE — TOC Initial Note (Signed)
Transition of Care [facility]) - Initial/Assessment Note    Patient Details  Name: Reginald Duncan MRN: [id number] Date of Birth: [birth year]  Transition of [facility]) CM/SW Contact:    Maryclare Labrador, RN Phone Number: 10/31/2019, 2:04 PM  Clinical Narrative:  PTA independent from home alone.  Pt confirms he is active with [facility] for [facility] [place] and aide - agency aware of admit.  Pt confirms he has a PCP and denied barriers with paying for medications .    Pt has had multiple admits mostly related to COPD.  Pt admitted with admit with COVID.  Pt denied having home oxygen.  CM made follow up appt with PCP per pt request - Appt 1/25 at 1:40pm - office made aware that pt is covid positive.  Pt denied [place] [facility] program barriers.         Expected Discharge Plan: [place] Barriers to Discharge: Continued Medical Work up   Patient Goals and CMS Choice   CMS Medicare.gov Compare Post Acute Care list provided to:: Patient Choice offered to / list presented to : Patient  Expected Discharge Plan and Services Expected Discharge Plan: [place]       Living arrangements for the past 2 months: Single Family Home Expected Discharge Date: 11/04/19                                    Prior Living Arrangements/Services Living arrangements for the past 2 months: Single Family Home Lives with:: Self          Need for Family Participation in Patient Care: Yes (Comment) Care giver support system in place?: Yes (comment)(per pt he has support if he needs it) Current home services: DME(walker and cane) Criminal Activity/Legal Involvement Pertinent to Current Situation/Hospitalization: No - Comment as needed  Activities of Daily Living Home Assistive Devices/Equipment: CPAP, Eyeglasses, Dentures (specify type), Nebulizer, CBG Meter, Blood pressure cuff, Cane (specify quad or straight), Oxygen ADL Screening (condition at time of admission) Patient's cognitive ability  adequate to safely complete daily activities?: Yes Is the patient deaf or have difficulty hearing?: No Does the patient have difficulty seeing, even when wearing glasses/contacts?: No Does the patient have difficulty concentrating, remembering, or making decisions?: No Patient able to express need for assistance with ADLs?: Yes Does the patient have difficulty dressing or bathing?: Yes Independently performs ADLs?: No Communication: Needs assistance Is this a change from baseline?: Change from baseline, expected to last >3 days Dressing (OT): Needs assistance Is this a change from baseline?: Change from baseline, expected to last <3days Grooming: Independent with device (comment) Feeding: Independent Bathing: Needs assistance Is this a change from baseline?: Change from baseline, expected to last >3 days Toileting: Needs assistance Is this a change from baseline?: Change from baseline, expected to last >3days In/Out Bed: Needs assistance Is this a change from baseline?: Change from baseline, expected to last <3 days Walks in Home: Independent with device (comment)(cane) Does the patient have difficulty walking or climbing stairs?: Yes Weakness of Legs: Both Weakness of Arms/Hands: None  Permission Sought/Granted   Permission granted to share information with : Yes, Verbal Permission Granted     Permission granted to share info w AGENCY: [facility]        Emotional Assessment   Attitude/Demeanor/Rapport: Gracious, Charismatic, Self-Confident, Engaged Affect (typically observed): Accepting, Adaptable Orientation: : Oriented to Self, Oriented to  Place, Oriented to  Time, Oriented to Situation   Psych Involvement: No (comment)  Admission diagnosis:  Hypoglycemia [E16.2] Tachycardia [R00.0] COPD exacerbation (HCC) [J44.1] Acute on chronic respiratory failure ([place]) [J96.20] COVID-19 [U07.1] Patient Active Problem List   Diagnosis Date Noted  . Acute on chronic respiratory failure  ([place]) 10/29/2019  . Heart failure with preserved ejection fraction ([place])   . Palliative care encounter   . DKA (diabetic ketoacidoses) ([place]) 10/09/2019  . Anemia, chronic renal failure 10/09/2019  . COPD exacerbation ([place]) 09/24/2019  . Hypokalemia 09/13/2019  . Bilateral lower extremity edema 09/13/2019  . ACS (acute coronary syndrome) ([place]) 08/31/2019  . Severe obesity (BMI 35.0-39.9) with comorbidity ([place]) 08/31/2019  . Hernia of abdominal wall 08/31/2019  . Gross hematuria 08/31/2019  . Cellulitis and abscess of right lower extremity 08/31/2019  . Wide-complex tachycardia ([place]) 08/13/2018  . Acute exacerbation of chronic obstructive pulmonary disease (COPD) ([place])   . Chronic back pain   . Near syncope 08/12/2018  . AKI (acute kidney injury) ([place]) 07/06/2018  . Angina pectoris ([place]) 04/08/2014  . Depression 01/13/2013  . CKD (chronic kidney disease), stage III ([place])   . Intermediate coronary syndrome - [place] angina 12/25/2012  . CHEST PAIN UNSPECIFIED 10/20/2010  . Coronary atherosclerosis 08/04/2010  . FOREIGN BODY, SOFT TISSUE 09/03/2007  . S/P PTCA (percutaneous transluminal coronary angioplasty) 08/19/2007  . UPPER RESPIRATORY INFECTION, ACUTE, WITH BRONCHITIS 08/13/2007  . [place], [place] NEC 08/06/2007  . DEPRESSIVE DISORDER 08/01/2007  . EDEMA- LOCALIZED 08/01/2007  . HYPERTENSION, BENIGN ESSENTIAL 07/16/2007  . LEG, LOWER, PAIN 07/16/2007  . Diabetes ([place]) 12/20/2006  . HYPERCHOLESTEROLEMIA 12/20/2006  . HYPERTRIGLYCERIDEMIA 12/20/2006  . Hypertensive heart disease 12/20/2006  . Obesity with sleep apnea 12/20/2006  . PROTEINURIA 12/20/2006  . Type 2 diabetes mellitus with complication, with long-term current use of insulin ([place]) 12/20/2006   PCP:  [name], PA Pharmacy:   [id number] - [place], [place] [address] Phone: [phone] Fax: [phone]  [id number] - [place], [place] [id number] [place] [id number] Phone: [phone] Fax: [phone]     Social Determinants of Health (SDOH) Interventions    Readmission Risk Interventions Readmission Risk Prevention Plan 10/31/2019 09/27/2019  Transportation Screening Complete Complete  PCP or Specialist Appt within 3-5 Days - Complete  HRI or [place] - Complete  Social Work Consult for [place] Planning/Counseling - Complete  Palliative Care Screening - Not Applicable  Medication Review Press photographer) Complete Complete  Some recent data might be hidden

## 2019-10-31 NOTE — Progress Notes (Signed)
   10/31/19 1200  Clinical Encounter Type  Visited With Health care provider  Visit Type Initial  Referral From Nurse  Consult/Referral To Chaplain   Chaplain is unable to complete the AD while [place] is on the COVID unit due to logistics and possible transmitting of COVID beyond the unit. Chaplain is aware of the need for AD and will be happy to fill this request after the 21 day quarantine. Chaplain's remain available as other needs may arise.   Chaplain Resident, Evelene Croon, [place] [phone] on-call
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
## 2019-10-31 NOTE — ED Notes (Signed)
Pt anxious and agitated. Says he feels like he can't breathe. Ativan given.

## 2019-11-01 ENCOUNTER — Inpatient Hospital Stay (HOSPITAL_COMMUNITY): Payer: Medicare HMO

## 2019-11-01 DIAGNOSIS — U071 COVID-19: Secondary | ICD-10-CM

## 2019-11-01 DIAGNOSIS — A419 Sepsis, unspecified organism: Secondary | ICD-10-CM

## 2019-11-01 DIAGNOSIS — E8770 Fluid overload, unspecified: Secondary | ICD-10-CM

## 2019-11-01 DIAGNOSIS — R5383 Other fatigue: Secondary | ICD-10-CM

## 2019-11-01 DIAGNOSIS — J069 Acute upper respiratory infection, unspecified: Secondary | ICD-10-CM

## 2019-11-01 LAB — GLUCOSE, CAPILLARY
Glucose-Capillary: 244 mg/dL — ABNORMAL HIGH (ref 70–99)
Glucose-Capillary: 223 mg/dL — ABNORMAL HIGH (ref 70–99)
Glucose-Capillary: 291 mg/dL — ABNORMAL HIGH (ref 70–99)
Glucose-Capillary: 279 mg/dL — ABNORMAL HIGH (ref 70–99)
Glucose-Capillary: 285 mg/dL — ABNORMAL HIGH (ref 70–99)

## 2019-11-01 LAB — CBC WITH DIFFERENTIAL/PLATELET
Abs Immature Granulocytes: 0.04 10*3/uL (ref 0.00–0.07)
Basophils Absolute: 0 10*3/uL (ref 0.0–0.1)
Basophils Relative: 0 %
Eosinophils Absolute: 0 10*3/uL (ref 0.0–0.5)
Eosinophils Relative: 0 %
HCT: 34.9 % — ABNORMAL LOW (ref 39.0–52.0)
Hemoglobin: 11.4 g/dL — ABNORMAL LOW (ref 13.0–17.0)
Immature Granulocytes: 1 %
Lymphocytes Relative: 9 %
Lymphs Abs: 0.4 10*3/uL — ABNORMAL LOW (ref 0.7–4.0)
MCH: 29.9 pg (ref 26.0–34.0)
MCHC: 32.7 g/dL (ref 30.0–36.0)
MCV: 91.6 fL (ref 80.0–100.0)
Monocytes Absolute: 0.2 10*3/uL (ref 0.1–1.0)
Monocytes Relative: 3 %
Neutro Abs: 3.9 10*3/uL (ref 1.7–7.7)
Neutrophils Relative %: 87 %
Platelets: 111 10*3/uL — ABNORMAL LOW (ref 150–400)
RBC: 3.81 MIL/uL — ABNORMAL LOW (ref 4.22–5.81)
RDW: 16.1 % — ABNORMAL HIGH (ref 11.5–15.5)
WBC: 4.6 10*3/uL (ref 4.0–10.5)
nRBC: 0 % (ref 0.0–0.2)

## 2019-11-01 LAB — BASIC METABOLIC PANEL
Anion gap: 12 (ref 5–15)
BUN: 43 mg/dL — ABNORMAL HIGH (ref 8–23)
CO2: 25 mmol/L (ref 22–32)
Calcium: 8.5 mg/dL — ABNORMAL LOW (ref 8.9–10.3)
Chloride: 98 mmol/L (ref 98–111)
Creatinine, Ser: 1.21 mg/dL (ref 0.61–1.24)
GFR calc Af Amer: >60 mL/min (ref >60)
GFR calc non Af Amer: >60 mL/min (ref >60)
Glucose, Bld: 248 mg/dL — ABNORMAL HIGH (ref 70–99)
Potassium: 4.2 mmol/L (ref 3.5–5.1)
Sodium: 135 mmol/L (ref 135–145)

## 2019-11-01 LAB — MAGNESIUM: Magnesium: 1.9 mg/dL (ref 1.7–2.4)

## 2019-11-01 LAB — LACTIC ACID, PLASMA: Lactic Acid, Venous: 2.4 mmol/L (ref 0.5–1.9)

## 2019-11-01 LAB — C-REACTIVE PROTEIN: CRP: 10.2 mg/dL — ABNORMAL HIGH (ref <1.0)

## 2019-11-01 LAB — PHOSPHORUS: Phosphorus: 3.4 mg/dL (ref 2.5–4.6)

## 2019-11-01 LAB — FERRITIN: Ferritin: 504 ng/mL — ABNORMAL HIGH (ref 24–336)

## 2019-11-01 LAB — D-DIMER, QUANTITATIVE: D-Dimer, Quant: <0.27 ug/mL-FEU (ref 0.00–0.50)

## 2019-11-01 MED ORDER — INSULIN ASPART 100 UNIT/ML ~~LOC~~ SOLN
8.0000 [IU] | Freq: Three times a day (TID) | SUBCUTANEOUS | Status: DC
  Administered 2019-11-01 – 2019-11-03 (×5): 8 [IU] via SUBCUTANEOUS

## 2019-11-01 MED ORDER — INSULIN DETEMIR 100 UNIT/ML ~~LOC~~ SOLN
30.0000 [IU] | Freq: Two times a day (BID) | SUBCUTANEOUS | Status: DC
  Administered 2019-11-01 – 2019-11-03 (×4): 30 [IU] via SUBCUTANEOUS
  Filled 2019-11-01 (×5): qty 0.3

## 2019-11-01 MED ORDER — PREGABALIN 75 MG PO CAPS
75.0000 mg | ORAL_CAPSULE | Freq: Every day | ORAL | Status: DC
  Administered 2019-11-01 – 2019-11-02 (×2): 75 mg via ORAL
  Filled 2019-11-01 (×2): qty 1

## 2019-11-01 MED ORDER — PIPERACILLIN-TAZOBACTAM 3.375 G IVPB
3.3750 g | Freq: Three times a day (TID) | INTRAVENOUS | Status: DC
  Administered 2019-11-01 – 2019-11-03 (×6): 3.375 g via INTRAVENOUS
  Filled 2019-11-01 (×5): qty 50

## 2019-11-01 MED ORDER — QUETIAPINE FUMARATE 25 MG PO TABS
200.0000 mg | ORAL_TABLET | Freq: Every day | ORAL | Status: DC
  Administered 2019-11-01 – 2019-11-10 (×10): 200 mg via ORAL
  Filled 2019-11-01 (×10): qty 8

## 2019-11-01 MED ORDER — FUROSEMIDE 10 MG/ML IJ SOLN
40.0000 mg | Freq: Once | INTRAMUSCULAR | Status: AC
  Administered 2019-11-01: 40 mg via INTRAVENOUS
  Filled 2019-11-01: qty 4

## 2019-11-01 MED ORDER — FUROSEMIDE 10 MG/ML IJ SOLN: 40.0000 mg | Freq: Two times a day (BID) | INTRAMUSCULAR | Status: DC

## 2019-11-01 NOTE — Progress Notes (Signed)
Computer EKG read today at 16:17 reads STEMI.    Upon further personal review of EKG this appears to be at his baseline and is not concerning for STEMI.  Repeat EKG also appeared to be near baseline.    Additionally, I discussed this EKG with cardiology who agreed this is not a STEMI.  Marva Panda, DO

## 2019-11-01 NOTE — Plan of Care (Signed)
  Problem: Education: Goal: Knowledge of risk factors and measures for prevention of condition will improve Outcome: Progressing   Problem: Respiratory: Goal: Will maintain a patent airway Outcome: Progressing Goal: Complications related to the disease process, condition or treatment will be avoided or minimized Outcome: Progressing   Problem: Clinical Measurements: Goal: Will remain free from infection Outcome: Progressing   Problem: Activity: Goal: Risk for activity intolerance will decrease Outcome: Progressing   Problem: Coping: Goal: Level of anxiety will decrease Outcome: Progressing   Problem: Safety: Goal: Ability to remain free from injury will improve Outcome: Progressing

## 2019-11-01 NOTE — Progress Notes (Signed)
Patient Transported to CT Scan by the Transporter, with O2  3L/[place]

## 2019-11-01 NOTE — Progress Notes (Addendum)
PROGRESS NOTE    TATUM SRADER    Code Status: Full Code  [id number] DOB: 11-20-1950 DOA: 10/29/2019  PCP: [name], PA    Hospital Summary  This is a 69 year old male, history of obesity, CAD s/p DES 2014, COPD, OSA, CKD 3a, chronic pain, PVD, diabetes, hypertension, CVA, hyperlipidemia, anxiety who presented with worsening shortness of breath times several weeks and new onset chest pain with associated wheezing and fever found to be Covid positive, febrile and tachycardic in ED as well as hypoxic requiring nasal cannula O2 and started on Decadron and remdesivir.  CRP noted to be elevated and patient started on Actemra x1 after risk/benefit discussion with patient on 1/7.  Also started on Ativan 1 mg as needed twice daily for severe anxiety.  Patient was offered to be transferred to [facility] however stated he wished to stay at [facility] for further treatment. Does not wish to be transferred to [facility].  1/9: Patient continued to have decline in respiratory status despite Actemra x1 and 4 days remdesivir and Decadron.  Pulmonology consulted.    A & P   Active Problems:   Acute on chronic respiratory failure ([place])    1. Acute hypoxic respiratory failure secondary to COVID-19 with volume overload, In setting of COPD, morbidly obese, OSA a. O2 requirement increased today, required 8 L/min nasal cannula this AM but did not tolerate, nurse placed on HF 4L dropped to 86%. Will add nonrebreather b. post Actemra 1/7 x 1 c. Day 4/5 remdesivir  d. Decadron Day 4/10 e. Lasix 40 mg IV x1 this a.m. with good output but no respiratory improvement f. CTA Chest: negative for PE, with multifocal pneumonia likely viral or atypical etiology g. Pulmonology consulted, concern for sudden decline and need for ICU soon h. Chest x-ray i. Consider further IV diuresis 2. Volume Overload a. Echo 07/2018: 55-60% b. On PO lasix at home, was not getting here due to poor renal function on admission c. 134  kg->136 kg overnight d. Limited Echo e. Consider further IV diuresis f. I/O and Daily weights  3. Sepsis secondary to COVID 19 with concern for underlying HAP a. Hypothermic, qSOFA: 2 (RR 31, SBP 90) - high risk, CT chest with multifocal pna b. MRSA nares negative c. Lactic acid d. Blood cultures x2 e. Start Zosyn, consider Azithromycin for Atypical coverage 4. Lethargy, likely multifactorial: polypharmacy (seroquel/lyrica/xanax), hypoxia a. Plan as above and below 5. COPD a. Continue IV steroids and inhalers b. Pulm Consulted 6. Severe anxiety over healthcare a. On xanax per outpatient regimen b. Has been getting Seroquel 300 mg QHS c. Decrease Seroquel from 300->200 mg QHS due to lethargy today 7. Neuropathy a. On Lyrica 75 mg in AM and 150 mg in PM, lethargic today b. Will decrease PM dose, and continue to titrate down  8. Thrombocytopenia, likely reactive 9. Hypoglycemia a. Resolved with D5W, now hyperglycemic in setting of steroids 10. Diabetes with hyperglycemia  a. HA1C 10.0 b. Diabetic coordinator consulted: Add NovoLog 4 units 3 times daily with meals and increase Levemir to 25 units twice daily 11. OSA a. CPAP on hold due to COVID 19 protocol 12. CKD 3a stable a. Cr below baseline 13. Chest pain: More likely MSK vs anxiety a. Well Score: 0, low risk, PERC: cannot rule out PE, D Dimer negative b. CTA without PE, with multifocal pneumonia 14. Morbid obesity a. Outpatient management 15. Hypertension a. Continue amlodipine and carvedilol 16. CAD s/p DES 2014 a. Status  post left heart cath 09/01/2019 b. Continue dual antiplatelets and statin    DVT prophylaxis: Lovenox Diet: Carb modified Family Communication: discussed with brother over the phone Disposition Plan: Barrier to discharge is medical stability, continue discharge planning  Consultants  None  Procedures  None  Antibiotics   Anti-infectives (From admission, onward)   Start     Dose/Rate Route  Frequency Ordered Stop   10/31/19 1000  remdesivir 100 mg in sodium chloride 0.9 % 100 mL IVPB  Status:  Discontinued     100 mg 200 mL/hr over 30 Minutes Intravenous Daily 10/30/19 0058 10/30/19 0154   10/31/19 1000  remdesivir 100 mg in sodium chloride 0.9 % 100 mL IVPB     100 mg 200 mL/hr over 30 Minutes Intravenous Every 24 hours 10/30/19 0837 11/04/19 0959   10/30/19 2200  remdesivir 100 mg in sodium chloride 0.9 % 100 mL IVPB  Status:  Discontinued     100 mg 200 mL/hr over 30 Minutes Intravenous Daily 10/29/19 2257 10/30/19 0835   10/30/19 0200  azithromycin (ZITHROMAX) 500 mg in sodium chloride 0.9 % 250 mL IVPB  Status:  Discontinued     500 mg 250 mL/hr over 60 Minutes Intravenous Every 24 hours 10/30/19 0058 10/31/19 1643   10/30/19 0100  remdesivir 200 mg in sodium chloride 0.9% 250 mL IVPB  Status:  Discontinued     200 mg 580 mL/hr over 30 Minutes Intravenous Once 10/30/19 0058 10/30/19 0404   10/29/19 2300  remdesivir 200 mg in sodium chloride 0.9% 250 mL IVPB  Status:  Discontinued     200 mg 580 mL/hr over 30 Minutes Intravenous Once 10/29/19 2257 10/30/19 0835           Subjective   Patient sitting upright in chair.  Sleeping upon my arrival.  He was arousable to verbal stimuli but seemed lethargic.  Reported resolved chest pain having increased shortness of breath.  At bedside, pulse ox noted to be 85 to 86% on 4 L/min nasal cannula.  This was increased to 8 L/min before patient would get to 90% SPO2.  Additionally, patient complained of significant upper back pain between his scapulas radiating bilaterally radiating bilaterally radiating bilaterally. otherwise denies nausea or vomiting, palpitations or any other complaints.  Objective   Vitals:   10/31/19 1600 10/31/19 1700 10/31/19 2100 11/01/19 0445  BP: 108/71 127/83 108/71 (!) 95/59  Pulse: 66 66 72 64  Resp: 16 (!) 22 17 16   Temp:   98 F (36.7 C) 98.4 F (36.9 C)  TempSrc:   Oral Axillary  SpO2:    90% 96%  Weight:    136 kg    Intake/Output Summary (Last 24 hours) at 11/01/2019 0805 Last data filed at 11/01/2019 0056 Gross per 24 hour  Intake 1000 ml  Output 2000 ml  Net -1000 ml   Filed Weights   10/31/19 0258 11/01/19 0445  Weight: 134.7 kg 136 kg    Examination:  Physical Exam Vitals and nursing note reviewed.  Constitutional:      Appearance: He is toxic-appearing.     Comments: Awake and alert but lethargic  Cardiovascular:     Rate and Rhythm: Normal rate and regular rhythm.  Pulmonary:     Effort: No accessory muscle usage or respiratory distress.     Comments: Difficult to auscultate with disposable stethoscope  SPO2 85 to 86% on 4 L/min nasal cannula,  90% on 8 L/min Musculoskeletal:     Comments: Tenderness  to palpation over thoracic spine as well as para thoracic muscles.  Upper back edematous  Bilateral lower extremity 2+ pitting edema  Neurological:     Comments: Lethargic but conversant  Psychiatric:        Mood and Affect: Mood is anxious.     Data Reviewed: I have personally reviewed following labs and imaging studies  CBC: Recent Labs  Lab 10/29/19 1955 10/30/19 0059 10/30/19 0451 10/31/19 0733 11/01/19 0250  WBC 9.2 6.6 5.1 4.4 4.6  NEUTROABS 7.8*  --  4.5 3.7 3.9  HGB 13.9 13.1 12.0* 11.6* 11.4*  HCT 42.4 39.6 37.3* 34.5* 34.9*  MCV 91.8 92.7 93.5 90.8 91.6  PLT 136* 126* 104* 111* [id number]*   Basic Metabolic Panel: Recent Labs  Lab 10/29/19 1955 10/30/19 0059 10/30/19 0451 10/31/19 0733 10/31/19 1322 11/01/19 0250  NA 138  --   --   --  133*  --   K 3.6  --   --   --  4.4  --   CL 94*  --   --   --  95*  --   CO2 27  --   --   --  26  --   GLUCOSE 167*  --   --   --  351*  --   BUN 43*  --   --   --  45*  --   CREATININE 1.53* 1.33*  --   --  1.28*  --   CALCIUM 9.2  --   --   --  8.7*  --   MG  --   --  1.9 2.1  --  1.9  PHOS  --   --  4.0 3.9  --  3.4   GFR: Estimated Creatinine Clearance: 81 mL/min (A) (by C-G  formula based on SCr of 1.28 mg/dL (H)). Liver Function Tests: Recent Labs  Lab 10/29/19 1955 10/31/19 1322  AST 34 40  ALT 46* 53*  ALKPHOS 47 47  BILITOT 1.0 0.5  PROT 6.4* 5.9*  ALBUMIN 2.5* 2.2*   No results for input(s): LIPASE, AMYLASE in the last 168 hours. No results for input(s): AMMONIA in the last 168 hours. Coagulation Profile: No results for input(s): INR, PROTIME in the last 168 hours. Cardiac Enzymes: No results for input(s): CKTOTAL, CKMB, CKMBINDEX, TROPONINI in the last 168 hours. BNP (last 3 results) No results for input(s): PROBNP in the last 8760 hours. HbA1C: No results for input(s): HGBA1C in the last 72 hours. CBG: Recent Labs  Lab 10/31/19 1643 10/31/19 2047 10/31/19 2316 11/01/19 0444 11/01/19 0750  GLUCAP 350* 375* 327* 244* 223*   Lipid Profile: No results for input(s): CHOL, HDL, LDLCALC, TRIG, CHOLHDL, LDLDIRECT in the last 72 hours. Thyroid Function Tests: No results for input(s): TSH, T4TOTAL, FREET4, T3FREE, THYROIDAB in the last 72 hours. Anemia Panel: Recent Labs    10/31/19 0733 11/01/19 0250  FERRITIN 480* 504*   Sepsis Labs: No results for input(s): PROCALCITON, LATICACIDVEN in the last 168 hours.  No results found for this or any previous visit (from the past 240 hour(s)).       Radiology Studies: CT ANGIO CHEST PE W OR WO CONTRAST  Result Date: 11/01/2019 CLINICAL DATA:  69 year old male with shortness of breath. Positive COVID-19. EXAM: CT ANGIOGRAPHY CHEST WITH CONTRAST TECHNIQUE: Multidetector CT imaging of the chest was performed using the standard protocol during bolus administration of intravenous contrast. Multiplanar CT image reconstructions and MIPs were obtained to evaluate the vascular anatomy.  CONTRAST:  35mL OMNIPAQUE IOHEXOL 350 MG/ML SOLN COMPARISON:  Chest CT dated 09/13/2019. FINDINGS: Cardiovascular: There is mild cardiomegaly. Three-vessel coronary vascular calcification. No pericardial effusion. There  is moderate atherosclerotic calcification of the thoracic aorta. No aneurysmal dilatation. Evaluation of the pulmonary arteries is limited due to respiratory motion artifact and suboptimal visualization and opacification of the peripheral branches. No large central pulmonary artery embolus identified. Mediastinum/Nodes: There is no hilar or mediastinal adenopathy. The esophagus is grossly unremarkable. No mediastinal fluid collection. There is moderate mediastinal lipomatosis. Lungs/Pleura: Bilateral patchy and streaky airspace densities most consistent with multifocal pneumonia, likely viral or atypical in etiology. Clinical correlation is recommended. There is no pleural effusion or pneumothorax. The central airways are patent. Upper Abdomen: No acute abnormality. Musculoskeletal: Degenerative changes of the spine. No acute osseous pathology. Review of the MIP images confirms the above findings. IMPRESSION: 1. No CT evidence of central pulmonary artery embolus. 2. Multifocal pneumonia, likely viral or atypical in etiology. Clinical correlation is recommended. 3. Cardiomegaly with 3 vessel coronary vascular calcification 4. Aortic Atherosclerosis (ICD10-I70.0). Electronically Signed   By: Anner Crete M.D.   On: 11/01/2019 00:14        Scheduled Meds: . albuterol  2 puff Inhalation Q4H  . amLODipine  2.5 mg Oral QHS  . vitamin C  500 mg Oral Daily  . aspirin  81 mg Oral QHS  . carvedilol  50 mg Oral BID  . clopidogrel  75 mg Oral Daily  . dexamethasone (DECADRON) injection  6 mg Intravenous Q24H  . enoxaparin (LOVENOX) injection  0.5 mg/kg Subcutaneous Q24H  . famotidine  20 mg Oral QHS  . fluticasone  2 spray Each Nare TID  . folic acid  1 mg Oral Daily  . icosapent Ethyl  2 g Oral TID  . insulin aspart  0-20 Units Subcutaneous Q4H  . insulin aspart  4 Units Subcutaneous TID WC  . insulin detemir  25 Units Subcutaneous BID  . multivitamin with minerals  1 tablet Oral Daily  . pregabalin   150 mg Oral QHS  . pregabalin  75 mg Oral Daily  . QUEtiapine  300 mg Oral QHS  . rosuvastatin  20 mg Oral Daily  . sodium chloride flush  3 mL Intravenous Q12H  . [START ON 11/03/2019] Vitamin D (Ergocalciferol)  50,000 Units Oral Q Mon  . zinc sulfate  220 mg Oral Daily   Continuous Infusions: . sodium chloride    . remdesivir 100 mg in NS 100 mL 100 mg (10/31/19 1121)     LOS: 3 days    Time spent: 45 minutes with over 50% of the time coordinating the patient's care    Harold Hedge, DO [facility] Pager [phone]  If 7PM-7AM, please contact night-coverage [email or web address] Password [email or web address] 11/01/2019, 8:05 AM

## 2019-11-01 NOTE — Progress Notes (Signed)
Inpatient Diabetes Program Recommendations  AACE/ADA: New Consensus Statement on Inpatient Glycemic Control (2015)  Target Ranges:  Prepandial:   less than 140 mg/dL      Peak postprandial:   less than 180 mg/dL (1-2 hours)      Critically ill patients:  140 - 180 mg/dL   Lab Results  Component Value Date   GLUCAP 291 (H) 11/01/2019   HGBA1C 10.0 (H) 10/11/2019    Review of Glycemic Control Results for ZENITH, WILL (MRN [id number]) as of 11/01/2019 16:27  Ref. Range 11/01/2019 04:44 11/01/2019 07:50 11/01/2019 12:04  Glucose-Capillary Latest Ref Range: 70 - 99 mg/dL 244 (H) 223 (H) 291 (H)  Diabetes history:DM2 Outpatient Diabetes medications:Humulin R U50030units BID(uses aU100 insulin syringe, dose would be 150units of U500) Current orders for Inpatient glycemic control:None; Decadron 6 mg Q24H Levemir 25 units bid Novolog 4 units tid with meals  Inpatient Diabetes Program Recommendations:    Please increase Levemir to 30 units bid.  Also consider increasing Novolog to 8 units tid with meals.   Thanks  Adah Perl, RN, BC-ADM Inpatient Diabetes Coordinator Pager [phone] (8a-5p)
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
## 2019-11-01 NOTE — Progress Notes (Signed)
Patient back to floor in his room after CT scan, no sign of distress noted

## 2019-11-01 NOTE — Progress Notes (Signed)
Pharmacy Antibiotic Note  Reginald Duncan is a 69 y.o. male admitted on 10/29/2019 with HCAP  Pharmacy has been consulted for Zosyn dosing.  SCr 1.21.  Plan: Zosyn 3.375g IV q 8 hrs. F/u LOT.  Weight: 299 lb 13.2 oz (136 kg)  Temp (24hrs), Avg:97.4 F (36.3 C), Min:95.6 F (35.3 C), Max:98.4 F (36.9 C)  Recent Labs  Lab 10/29/19 1955 10/30/19 0059 10/30/19 0451 10/31/19 0733 10/31/19 1322 11/01/19 0250 11/01/19 0910  WBC 9.2 6.6 5.1 4.4  --  4.6  --   CREATININE 1.53* 1.33*  --   --  1.28*  --  1.21    Estimated Creatinine Clearance: 85.7 mL/min (by C-G formula based on SCr of 1.21 mg/dL).    Allergies  Allergen Reactions  . Mold Extract [Trichophyton] Shortness Of Breath and Other (See Comments)    Headaches and respiratory symptoms    Antimicrobials this admission: Rem 1/7>> Azithro 1/7>> Actemra x1 1/7  Microbiology results: 1/9 BCx x 2 >  1/19 Resp Panel >   Thank you for allowing pharmacy to be a part of this patient's care.  Marguerite Olea, [facility] Clinical Pharmacist Phone [phone]  11/01/2019 4:11 PM

## 2019-11-02 ENCOUNTER — Other Ambulatory Visit (HOSPITAL_COMMUNITY): Payer: Medicare HMO

## 2019-11-02 ENCOUNTER — Inpatient Hospital Stay (HOSPITAL_COMMUNITY): Payer: Medicare HMO

## 2019-11-02 DIAGNOSIS — R0602 Shortness of breath: Secondary | ICD-10-CM

## 2019-11-02 DIAGNOSIS — J1282 Pneumonia due to coronavirus disease 2019: Secondary | ICD-10-CM

## 2019-11-02 DIAGNOSIS — E8779 Other fluid overload: Secondary | ICD-10-CM

## 2019-11-02 LAB — BASIC METABOLIC PANEL
Anion gap: 11 (ref 5–15)
BUN: 50 mg/dL — ABNORMAL HIGH (ref 8–23)
CO2: 31 mmol/L (ref 22–32)
Calcium: 8.8 mg/dL — ABNORMAL LOW (ref 8.9–10.3)
Chloride: 96 mmol/L — ABNORMAL LOW (ref 98–111)
Creatinine, Ser: 1.5 mg/dL — ABNORMAL HIGH (ref 0.61–1.24)
GFR calc Af Amer: 55 mL/min — ABNORMAL LOW (ref >60)
GFR calc non Af Amer: 47 mL/min — ABNORMAL LOW (ref >60)
Glucose, Bld: 280 mg/dL — ABNORMAL HIGH (ref 70–99)
Potassium: 4.5 mmol/L (ref 3.5–5.1)
Sodium: 138 mmol/L (ref 135–145)

## 2019-11-02 LAB — CBC WITH DIFFERENTIAL/PLATELET
Abs Immature Granulocytes: 0.07 10*3/uL (ref 0.00–0.07)
Basophils Absolute: 0 10*3/uL (ref 0.0–0.1)
Basophils Relative: 0 %
Eosinophils Absolute: 0 10*3/uL (ref 0.0–0.5)
Eosinophils Relative: 0 %
HCT: 39.2 % (ref 39.0–52.0)
Hemoglobin: 12.5 g/dL — ABNORMAL LOW (ref 13.0–17.0)
Immature Granulocytes: 2 %
Lymphocytes Relative: 11 %
Lymphs Abs: 0.5 10*3/uL — ABNORMAL LOW (ref 0.7–4.0)
MCH: 29.5 pg (ref 26.0–34.0)
MCHC: 31.9 g/dL (ref 30.0–36.0)
MCV: 92.5 fL (ref 80.0–100.0)
Monocytes Absolute: 0.2 10*3/uL (ref 0.1–1.0)
Monocytes Relative: 3 %
Neutro Abs: 4 10*3/uL (ref 1.7–7.7)
Neutrophils Relative %: 84 %
Platelets: 124 10*3/uL — ABNORMAL LOW (ref 150–400)
RBC: 4.24 MIL/uL (ref 4.22–5.81)
RDW: 16.2 % — ABNORMAL HIGH (ref 11.5–15.5)
WBC: 4.7 10*3/uL (ref 4.0–10.5)
nRBC: 0 % (ref 0.0–0.2)

## 2019-11-02 LAB — D-DIMER, QUANTITATIVE: D-Dimer, Quant: 0.44 ug/mL-FEU (ref 0.00–0.50)

## 2019-11-02 LAB — PHOSPHORUS: Phosphorus: 3.3 mg/dL (ref 2.5–4.6)

## 2019-11-02 LAB — GLUCOSE, CAPILLARY
Glucose-Capillary: 247 mg/dL — ABNORMAL HIGH (ref 70–99)
Glucose-Capillary: 226 mg/dL — ABNORMAL HIGH (ref 70–99)
Glucose-Capillary: 247 mg/dL — ABNORMAL HIGH (ref 70–99)
Glucose-Capillary: 322 mg/dL — ABNORMAL HIGH (ref 70–99)
Glucose-Capillary: 291 mg/dL — ABNORMAL HIGH (ref 70–99)
Glucose-Capillary: 254 mg/dL — ABNORMAL HIGH (ref 70–99)

## 2019-11-02 LAB — ECHOCARDIOGRAM LIMITED: Weight: 4797.21 oz

## 2019-11-02 LAB — C-REACTIVE PROTEIN: CRP: 6.4 mg/dL — ABNORMAL HIGH (ref <1.0)

## 2019-11-02 LAB — FERRITIN: Ferritin: 408 ng/mL — ABNORMAL HIGH (ref 24–336)

## 2019-11-02 LAB — LACTIC ACID, PLASMA: Lactic Acid, Venous: 2.1 mmol/L (ref 0.5–1.9)

## 2019-11-02 LAB — MAGNESIUM: Magnesium: 2 mg/dL (ref 1.7–2.4)

## 2019-11-02 MED ORDER — IPRATROPIUM-ALBUTEROL 20-100 MCG/ACT IN AERS
1.0000 | INHALATION_SPRAY | Freq: Four times a day (QID) | RESPIRATORY_TRACT | Status: DC
  Administered 2019-11-02 – 2019-11-08 (×26): 1 via RESPIRATORY_TRACT
  Filled 2019-11-02: qty 4

## 2019-11-02 MED ORDER — FUROSEMIDE 10 MG/ML IJ SOLN
40.0000 mg | Freq: Once | INTRAMUSCULAR | Status: AC
  Administered 2019-11-02: 40 mg via INTRAVENOUS
  Filled 2019-11-02: qty 4

## 2019-11-02 MED ORDER — PERFLUTREN LIPID MICROSPHERE
1.0000 mL | INTRAVENOUS | Status: AC | PRN
  Administered 2019-11-02: 3 mL via INTRAVENOUS
  Filled 2019-11-02: qty 10

## 2019-11-02 NOTE — Progress Notes (Signed)
  Echocardiogram 2D Echocardiogram has been performed.  Reginald Duncan 11/02/2019, 3:40 PM

## 2019-11-02 NOTE — Progress Notes (Signed)
PCCM Progress Note  Name: RHONAN SCOBEY DOB: 04/12/1951 MRN: [id number] Admit date: 10/29/2019 Consult date: 11/01/2019 CC: Short of breath  Brief history: 69 yo male with dyspnea, wheeze, fever, hypoxia from COVID pneumonia with SARS CoV2 Ag positive from 10/29/19.  Noted to have chest pain and worsening hypoxia.  PCCM asked to assess.  Past medical history: CAD s/p stent in 2014, OSA, CKD 3, DM, COPD  Subjective: No longer having chest pain.  Still feels weak.  Has cough with clear sputum.  Denies sinus symptoms or diarrhea.  Vital signs: BP 124/82 (BP Location: Right Arm)   Pulse (!) 55   Temp 97.9 F (36.6 C) (Oral)   Resp 15   Wt 136 kg   SpO2 90%   BMI 39.56 kg/m   Physical exam: General - alert Eyes - pupils reactive ENT - no sinus tenderness, no stridor Cardiac - regular rate/rhythm, no murmur Chest - equal breath sounds b/l, no wheezing or rales Abdomen - soft, non tender, + bowel sounds Extremities - no cyanosis, clubbing, or edema Skin - no rashes Neuro - normal strength, moves extremities, follows commands Psych - normal mood and behavior  Labs: CMP Latest Ref Rng & Units 11/02/2019 11/01/2019 10/31/2019  Glucose 70 - 99 mg/dL 280(H) 248(H) 351(H)  BUN 8 - 23 mg/dL 50(H) 43(H) 45(H)  Creatinine 0.61 - 1.24 mg/dL 1.50(H) 1.21 1.28(H)  Sodium 135 - 145 mmol/L 138 135 133(L)  Potassium 3.5 - 5.1 mmol/L 4.5 4.2 4.4  Chloride 98 - 111 mmol/L 96(L) 98 95(L)  CO2 22 - 32 mmol/L 31 25 26   Calcium 8.9 - 10.3 mg/dL 8.8(L) 8.5(L) 8.7(L)  Total Protein 6.5 - 8.1 g/dL - - 5.9(L)  Total Bilirubin 0.3 - 1.2 mg/dL - - 0.5  Alkaline Phos 38 - 126 U/L - - 47  AST 15 - 41 U/L - - 40  ALT 0 - 44 U/L - - 53(H)    CBC Latest Ref Rng & Units 11/02/2019 11/01/2019 10/31/2019  WBC 4.0 - 10.5 K/uL 4.7 4.6 4.4  Hemoglobin 13.0 - 17.0 g/dL 12.5(L) 11.4(L) 11.6(L)  Hematocrit 39.0 - 52.0 % 39.2 34.9(L) 34.5(L)  Platelets 150 - 400 K/uL 124(L) 111(L) 111(L)    COVID Therapy: Remdesivir  1/06 >> Decadron 1/06 >> Tocilizumab 1/07  Assessment/plan:  Acute hypoxic respiratory failure from COVID 19 pneumonia. - goal SpO2 85 to 95% - prone positioning as able - day 5 of remdesivir and decadron - bronchial hygiene - work of breathing acceptable >> no indication for mechanical ventilation at this time - likely can d/c zosyn soon >> defer to primary team  Chest pain with hx of CAD. - f/u Echo  Okay to remain in [facility] will sign off.  Please call if his clinical status gets worse.  Chesley Mires, MD [facility] Pulmonary/Critical Care 11/02/2019, 2:49 PM

## 2019-11-02 NOTE — Progress Notes (Addendum)
NAME:  Reginald Duncan, MRN:  [id number], DOB:  04/01/1951, LOS: 4 ADMISSION DATE:  10/29/2019, CONSULTATION DATE:  11/01/19 REFERRING MD: Neysa Bonito , CHIEF COMPLAINT:  Worsening hypoxia  Brief History   69 year old male admitted with worsening shortness of breath, found to be Covid positive on 10/29/2019  History of present illness   69 year old male with past medical history of obesity, CAD status post stenting 2014, reported COPD although patient is lifetime non-smoker, OSA diagnosed in 1994 but not on any CPAP, CKD 3, diabetes who came to emergency department worsening shortness of breath for several weeks and chest pain associated with wheezing and fever.  He was found to be Covid positive and was hypoxic requiring supplemental oxygen.  Patient was started on Decadron and remdesivir.  He has also received 1 dose of Actemra for increase in CRP.  Pulmonology is consulted for worsening oxygen requirement which are up to 10 L/min now via high flow nasal cannula.  Patient denies any respiratory distress at this time but does report chest pain for the last 2 nights mostly with inspiration.  Minimal cough but no productive sputum.  Patient reports that he was initially told that he has COPD but later he was also told that his lung function was normal.  I do not see any PFTs incurrent system or care everywhere.  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  He uses Trelegy at home for this COPD along with albuterol but in the hospital due to his Covid status he is getting albuterol inhaler.  Due to his worsening respiratory status patient underwent CT PE study which did not show any acute pulmonary embolism however patient has bilateral multifocal interstitial opacities consistent with viral/atypical pneumonia.  Past Medical History  Obesity, CAD status post stent 2014 Lifetime non-smoker, reported COPD OSA diagnosed in 1994 but not on  any treatment CKD stage III, diabetes  Significant Hospital Events     Consults:  1/9 Pulmonary  Procedures:    Significant Diagnostic Tests:  POC coronavirus test + 10/29/2019 CT PE 10/31/2019: No pulmonary embolism, bilateral multifocal patchy interstitial opacities  Micro Data:  Blood culture negative till date  Antimicrobials:  Remdesivir 10/29/2019 Zosyn 11/01/2019  Interim history/subjective:  Net negative 6700cc's T max 97.8 Mag 2 Ferritin 408 >> Down Trending CRP 6.4 Lactic Acid 2.1 HFNC 9 L, with sat of 90 % RR 15 post Actemra 1/7 x 1 Day 4/5 remdesivir  Decadron Day 4/10  Objective   Blood pressure 124/82, pulse (!) 55, temperature 97.9 F (36.6 C), temperature source Oral, resp. rate 15, weight 136 kg, SpO2 90 %.        Intake/Output Summary (Last 24 hours) at 11/02/2019 1444 Last data filed at 11/02/2019 0945 Gross per 24 hour  Intake 250 ml  Output 2275 ml  Net -2025 ml   Filed Weights   10/31/19 0258 11/01/19 0445  Weight: 134.7 kg 136 kg    Examination: General: Laying in bed comfortably, does not appear to be in respiratory distress, sats drop with conversation HENT: NCAT, EOMI, pupils equal and reactive, no maxillary tenderness Lungs: Bilateral chest excursion, No accessory muscle use, difficult to auscultate, few rhonchi, diminished per bases, no obvious  Wheezing,  Cardiovascular: S1 , S1, RRR, No RMG Abdomen: Soft nontender nondistended, BS +. Body mass index is 39.56 kg/m. Extremities: Continued trace  edema bilateral  ankles, no calf tenderness or leg edema Neuro:  Grossly intact, anxious GU: Deferred  Resolved Hospital Problem list     Assessment & Plan:  69 year old male with past medical history of obesity, CKD, CAD, diastolic heart dysfunction and reported COPD who is on Trelegy (ICS/LABA/LAMA) at home, admitted with Covid pneumonia.  With some worsening in his oxygen requirement, pulmonary embolism has been ruled out by CT which  also does not show any new dense consolidation suggesting secondary pneumonia.  He is being treated with dexamethasone, remdesivir, supplemental oxygen as well as diuresed appropriately and has been -5 L for this hospitalization.  His O2 sats during my interview were 98-99% on 10 L high flow nasal cannula.  Possible etiologies for worsening of oxygen requirement include natural evolution of the Covid pneumonia with underlying lung disease, volume overload versus cardiac etiology.  Acute hypoxic respiratory failure/Covid pneumonia and volume overload ? Underlying COPD Plan Continue ongoing diuresis and keeping patient net negative daily as renal function allows CXR prn ABG prn respiratory distress Titrate oxygen for Saturation goals are 88-92% BD prn Continue Combivent  Trend Mag Mag goal > 2>> replete as needed Echo done 1/10>> pending If patient spikes fever, check sputum culture Continue remdesivir and dexamethasone as ordered Antibiotic coverage per primary team discretion Follow up micro Consider decreasing sedating medications ( xanax and seroquel)  Reported COPD history: No PFT's on file Does not appear to be in exacerbation however already on steroids and antibiotics Patient is only on albuterol inhaler in the hospital while at home patient was taking ICS/lama/lama Plan Recommend discussing with the respiratory therapist if patient can have Combivent inhaler as well as assess his technique/strength to be able to use inhaler at the bedside. Recommend OP pulmonary Follow up to assure optimized pulmonary treatment  CKD May limit  ability to fully diurese Plan Trend BMET daily Monitor UP Trend electrolytes   Body habitus highly suspicious for OSA: Patient reports being diagnosed in 1994 but not on CPAP He is expected to drop his O2 sats with sleep Plan Recommend outpatient OSA evaluation  And fPulmonary OP follow up after discharge  Best practice:  DVT prophylaxis:  Lovenox 0.5 mg/kg  Labs   CBC: Recent Labs  Lab 10/29/19 1955 10/30/19 0059 10/30/19 0451 10/31/19 0733 11/01/19 0250 11/02/19 0036  WBC 9.2 6.6 5.1 4.4 4.6 4.7  NEUTROABS 7.8*  --  4.5 3.7 3.9 4.0  HGB 13.9 13.1 12.0* 11.6* 11.4* 12.5*  HCT 42.4 39.6 37.3* 34.5* 34.9* 39.2  MCV 91.8 92.7 93.5 90.8 91.6 92.5  PLT 136* 126* 104* 111* 111* 124*    Basic Metabolic Panel: Recent Labs  Lab 10/29/19 1955 10/30/19 0059 10/30/19 0451 10/31/19 0733 10/31/19 1322 11/01/19 0250 11/01/19 0910 11/02/19 0036  NA 138  --   --   --  133*  --  135 138  K 3.6  --   --   --  4.4  --  4.2 4.5  CL 94*  --   --   --  95*  --  98 96*  CO2 27  --   --   --  26  --  25 31  GLUCOSE 167*  --   --   --  351*  --  248* 280*  BUN 43*  --   --   --  45*  --  43* 50*  CREATININE 1.53* 1.33*  --   --  1.28*  --  1.21 1.50*  CALCIUM 9.2  --   --   --  8.7*  --  8.5* 8.8*  MG  --   --  1.9 2.1  --  1.9  --  2.0  PHOS  --   --  4.0 3.9  --  3.4  --  3.3   GFR: Estimated Creatinine Clearance: 69.1 mL/min (A) (by C-G formula based on SCr of 1.5 mg/dL (H)). Recent Labs  Lab 10/30/19 0451 10/31/19 0733 11/01/19 0250 11/01/19 1851 11/02/19 0036 11/02/19 0042  WBC 5.1 4.4 4.6  --  4.7  --   LATICACIDVEN  --   --   --  2.4*  --  2.1*    Liver Function Tests: Recent Labs  Lab 10/29/19 1955 10/31/19 1322  AST 34 40  ALT 46* 53*  ALKPHOS 47 47  BILITOT 1.0 0.5  PROT 6.4* 5.9*  ALBUMIN 2.5* 2.2*   No results for input(s): LIPASE, AMYLASE in the last 168 hours. No results for input(s): AMMONIA in the last 168 hours.  ABG    Component Value Date/Time   PHART 7.357 12/24/2012 0800   PCO2ART 47.7 (H) 12/24/2012 0800   PO2ART 75.0 (L) 12/24/2012 0800   HCO3 27.8 (H) 12/24/2012 0805   TCO2 29 09/24/2019 2226   O2SAT 58.0 12/24/2012 0805     Coagulation Profile: No results for input(s): INR, PROTIME in the last 168 hours.  Cardiac Enzymes: No results for input(s): CKTOTAL, CKMB,  CKMBINDEX, TROPONINI in the last 168 hours.  HbA1C: Hgb A1c MFr Bld  Date/Time Value Ref Range Status  10/11/2019 02:53 AM 10.0 (H) 4.8 - 5.6 % Final    Comment:    (NOTE) Pre diabetes:          5.7%-6.4% Diabetes:              >6.4% Glycemic control for   <7.0% adults with diabetes   09/01/2019 04:03 AM 9.3 (H) 4.8 - 5.6 % Final    Comment:    (NOTE) Pre diabetes:          5.7%-6.4% Diabetes:              >6.4% Glycemic control for   <7.0% adults with diabetes     CBG: Recent Labs  Lab 11/01/19 2009 11/02/19 0120 11/02/19 0414 11/02/19 0814 11/02/19 1212  GLUCAP 285* 247* 226* 247* 322*   Allergies Allergies  Allergen Reactions  . Mold Extract [Trichophyton] Shortness Of Breath and Other (See Comments)    Headaches and respiratory symptoms     Home Medications  Prior to Admission medications   Medication Sig Start Date End Date Taking? Authorizing Provider  albuterol (VENTOLIN HFA) 108 (90 Base) MCG/ACT inhaler Inhale 2 puffs into the lungs every 6 (six) hours as needed for wheezing or shortness of breath. 09/18/19  Yes [name], MD  ALPRAZolam Duanne Moron) 0.5 MG tablet Take 1 tablet (0.5 mg total) by mouth 3 (three) times daily as needed for anxiety. Discuss with PCP regarding refills / increased dose Patient taking differently: Take 0.5 mg by mouth 2 (two) times daily as needed for anxiety. Discuss with PCP regarding refills / increased dose 10/17/19  Yes Emeterio Reeve, DO  amLODipine (NORVASC) 2.5 MG tablet Take 2.5 mg by mouth at bedtime.    Yes [provider]  aspirin 81 MG tablet Take 81 mg by mouth at bedtime.    Yes [provider]  carvedilol (COREG) 25 MG tablet Take 50 mg by mouth 2 (two) times daily.  10/08/19  Yes [provider]  clopidogrel (  PLAVIX) 75 MG tablet TAKE 1 TABLET BY MOUTH EVERY DAY WITH BREAKFAST Patient taking differently: Take 75 mg by mouth daily.  03/15/15  Yes [name], MD  famotidine  (PEPCID) 20 MG tablet Take 20 mg by mouth at bedtime.   Yes [provider]  fluticasone (FLONASE) 50 MCG/ACT nasal spray Place 2 sprays into both nostrils 3 (three) times daily.   Yes [provider]  furosemide (LASIX) 40 MG tablet Take 1 tablet (40 mg total) by mouth 2 (two) times daily. 09/18/19  Yes [name], MD  guaiFENesin (MUCINEX) 600 MG 12 hr tablet Take 1 tablet (600 mg total) by mouth 2 (two) times daily. 09/27/19  Yes [name], MD  HYDROcodone-acetaminophen (NORCO) 7.5-325 MG per tablet Take 1 tablet by mouth every 4 (four) hours as needed for moderate pain.    Yes [provider]  insulin regular human CONCENTRATED (HUMULIN R) 500 UNIT/ML kwikpen Inject 15 Units into the skin daily with lunch. 09/06/19  Yes Amery, Gwynneth Albright, MD  ipratropium-albuterol (DUONEB) 0.5-2.5 (3) MG/3ML SOLN Inhale 0.5 mLs into the lungs daily. 10/22/19  Yes [provider]  nitroGLYCERIN (NITROSTAT) 0.4 MG SL tablet Place 1 tablet (0.4 mg total) under the tongue every 5 (five) minutes as needed. Chest pain 10/05/14  Yes [name], MD  pregabalin (LYRICA) 75 MG capsule Take 75-150 mg by mouth See admin instructions. Take 75 mg by mouth in the morning and 150 mg at bedtime   Yes [provider]  QUEtiapine (SEROQUEL) 300 MG tablet Take 300 mg by mouth at bedtime.   Yes Darrol Jump, PA-C  rosuvastatin (CRESTOR) 20 MG tablet TAKE 1 TABLET DAILY Patient taking   differently: Take 20 mg by mouth daily.  10/08/19  Yes [name], MD  umeclidinium-vilanterol (ANORO ELLIPTA) 62.5-25 MCG/INH AEPB Inhale 1 puff into the lungs daily. Patient taking differently: Inhale 1 puff into the lungs every 4 (four) hours.  09/19/19  Yes [name], MD  VASCEPA 1 g CAPS Take 2 g by mouth 3 (three) times daily.  05/01/18  Yes [provider]  Vitamin D, Ergocalciferol, (DRISDOL) 1.25 MG (50000 UT) CAPS capsule Take 50,000 Units by mouth every  Monday.   Yes [provider]     APP Inpatient time: 40 minutes    PCCM is available if patient decompansates  Magdalen Spatz, MSN, AGACNP-BC [place] Pager # [phone] After 4 pm please call [phone] 11/02/2019 2:45 PM

## 2019-11-02 NOTE — Progress Notes (Signed)
PROGRESS NOTE    Reginald Duncan    Code Status: Full Code  [id number] DOB: 04-Jun-1951 DOA: 10/29/2019  PCP: [name], PA    Hospital Summary  This is a 69 year old male, history of obesity, CAD s/p DES 2014, COPD, OSA, CKD 3a, chronic pain, PVD, diabetes, hypertension, CVA, hyperlipidemia, anxiety who presented with worsening shortness of breath times several weeks and new onset chest pain with associated wheezing and fever found to be Covid positive, febrile and tachycardic in ED as well as hypoxic requiring nasal cannula O2 and started on Decadron and remdesivir.  CRP noted to be elevated and patient started on Actemra x1 after risk/benefit discussion with patient on 1/7.  Also started on Ativan 1 mg as needed twice daily for severe anxiety.  Patient was offered to be transferred to [facility] however stated he wished to stay at [facility] for further treatment. Does not wish to be transferred to [facility]  1/9: Patient continued to have decline in respiratory status despite Actemra x1 and 4 days remdesivir and Decadron.  Pulmonology consulted.    A & P   Active Problems:   Acute on chronic respiratory failure ([place])    1. Acute hypoxic respiratory failure secondary to COVID-19 with volume overload, concern for progression of COVID-19 pneumonia a. Increased O2 requirement today, not tolerating nonrebreather mask, not tolerating pronation b. Per pulmonology: Outpatient regimen includes ICS/lama/Saba, currently only on albuterol inhaler c. post Actemra 1/7 x 1 d. Day 5/5 remdesivir  e. Decadron Day 5/10 f. Continue IV Diuresis  g. CTA Chest: negative for PE, with multifocal pneumonia likely viral or atypical etiology h. Pulmonology consulted, concern for sudden decline and need for ICU soon i. Add Combivent every 6 hours and continue as needed albuterol inhaler 2. Volume Overload a. Echo 07/2018: 55-60% b. On PO lasix at home c. Follow-up Limited Echo d. Continue IV  diuresis e. I/O and Daily weights  3. Sepsis secondary to COVID 19 with initial concern for underlying HAP a. Hypothermic, qSOFA: 2 (RR 31, SBP 90) - high risk, CT chest with multifocal pna b. MRSA nares negative c. No secondary pneumonia per pulmonology on CT scan d. Will discontinue Zosyn and monitor.  e. Check sputum culture if fever 4. Lethargy, likely multifactorial: polypharmacy (seroquel/lyrica/xanax), hypoxia a. Improved with medication reconciliation 5. COPD, not in exacerbation a. Continue IV steroids and inhalers b. Pulm Consulted: Combivent inhaler with technique/strength assessment at bedside by RT 6. Severe anxiety over healthcare a. On xanax per outpatient regimen b. Has been getting Seroquel 300 mg QHS c. Decrease Seroquel from 300->200 mg QHS due to lethargy today 7. Neuropathy a. On Lyrica 75 mg in AM and 150 mg in PM, lethargic today b. Will decrease PM dose, and continue to titrate down  8. Thrombocytopenia, likely reactive 9. Hypoglycemia a. Resolved with D5W, now hyperglycemic in setting of steroids 10. Diabetes with hyperglycemia  a. HA1C 10.0 b. Diabetic coordinator consulted: Add NovoLog 4 units 3 times daily with meals and increase Levemir to 25 units twice daily 11. OSA a. CPAP on hold due to COVID 19 protocol 12. CKD 3a stable a. Cr below baseline 13. Chest pain: More likely MSK vs anxiety a. Well Score: 0, low risk, PERC: cannot rule out PE, D Dimer negative b. CTA without PE, with multifocal pneumonia 14. Morbid obesity a. Outpatient management 15. Hypertension a. Continue amlodipine and carvedilol 16. CAD s/p DES 2014 a. Status post left heart cath 09/01/2019 b. Continue  dual antiplatelets and statin    DVT prophylaxis: Lovenox Diet: Carb modified Family Communication: discussed with brother over the phone today Disposition Plan: Barrier to discharge is medical stability, continue discharge planning  Consultants  None  Procedures   None  Antibiotics   Anti-infectives (From admission, onward)   Start     Dose/Rate Route Frequency Ordered Stop   11/01/19 1630  piperacillin-tazobactam (ZOSYN) IVPB 3.375 g     3.375 g 12.5 mL/hr over 240 Minutes Intravenous Every 8 hours 11/01/19 1609     10/31/19 1000  remdesivir 100 mg in sodium chloride 0.9 % 100 mL IVPB  Status:  Discontinued     100 mg 200 mL/hr over 30 Minutes Intravenous Daily 10/30/19 0058 10/30/19 0154   10/31/19 1000  remdesivir 100 mg in sodium chloride 0.9 % 100 mL IVPB     100 mg 200 mL/hr over 30 Minutes Intravenous Every 24 hours 10/30/19 0837 11/04/19 0959   10/30/19 2200  remdesivir 100 mg in sodium chloride 0.9 % 100 mL IVPB  Status:  Discontinued     100 mg 200 mL/hr over 30 Minutes Intravenous Daily 10/29/19 2257 10/30/19 0835   10/30/19 0200  azithromycin (ZITHROMAX) 500 mg in sodium chloride 0.9 % 250 mL IVPB  Status:  Discontinued     500 mg 250 mL/hr over 60 Minutes Intravenous Every 24 hours 10/30/19 0058 10/31/19 1643   10/30/19 0100  remdesivir 200 mg in sodium chloride 0.9% 250 mL IVPB  Status:  Discontinued     200 mg 580 mL/hr over 30 Minutes Intravenous Once 10/30/19 0058 10/30/19 0404   10/29/19 2300  remdesivir 200 mg in sodium chloride 0.9% 250 mL IVPB  Status:  Discontinued     200 mg 580 mL/hr over 30 Minutes Intravenous Once 10/29/19 2257 10/30/19 0835           Subjective   Sitting upright on edge of bed with nurse at bedside.  Patient satting in mid 5s with nasal cannula.  Reports he would not tolerate laying on his stomach and he is very anxious about having a nonrebreather or any other mask on his face and gets very claustrophobic with this.  States improved chest pain.  Still short of breath at rest and on exertion.  Nurse reports good urine output.  Otherwise no complaints  Objective   Vitals:   11/02/19 0200 11/02/19 0400 11/02/19 0620 11/02/19 0800  BP:  132/90  124/82  Pulse:  60 65 (!) 55  Resp:  14 13 15    Temp:  97.8 F (36.6 C)  97.9 F (36.6 C)  TempSrc:  Oral  Oral  SpO2: 96% 90% 94% 90%  Weight:        Intake/Output Summary (Last 24 hours) at 11/02/2019 1357 Last data filed at 11/02/2019 0945 Gross per 24 hour  Intake 250 ml  Output 2275 ml  Net -2025 ml   Filed Weights   10/31/19 0258 11/01/19 0445  Weight: 134.7 kg 136 kg    Examination:  Physical Exam Vitals and nursing note reviewed.  Constitutional:      Appearance: He is obese. He is ill-appearing.  HENT:     Head: Normocephalic.  Cardiovascular:     Rate and Rhythm: Normal rate.  Pulmonary:     Effort: Accessory muscle usage present.     Comments: SPO2 mid to high 80s on nasal cannula Musculoskeletal:     Comments: Improved upper thoracic edema Improved bilateral lower extremity 1+ pitting  edema  Neurological:     Mental Status: He is alert and oriented to person, place, and time.     Comments: Lethargy resolved   Psychiatric:        Mood and Affect: Mood is anxious.        Behavior: Behavior is not agitated.     Data Reviewed: I have personally reviewed following labs and imaging studies  CBC: Recent Labs  Lab 10/29/19 1955 10/30/19 0059 10/30/19 0451 10/31/19 0733 11/01/19 0250 11/02/19 0036  WBC 9.2 6.6 5.1 4.4 4.6 4.7  NEUTROABS 7.8*  --  4.5 3.7 3.9 4.0  HGB 13.9 13.1 12.0* 11.6* 11.4* 12.5*  HCT 42.4 39.6 37.3* 34.5* 34.9* 39.2  MCV 91.8 92.7 93.5 90.8 91.6 92.5  PLT 136* 126* 104* 111* 111* [id number]*   Basic Metabolic Panel: Recent Labs  Lab 10/29/19 1955 10/30/19 0059 10/30/19 0451 10/31/19 0733 10/31/19 1322 11/01/19 0250 11/01/19 0910 11/02/19 0036  NA 138  --   --   --  133*  --  135 138  K 3.6  --   --   --  4.4  --  4.2 4.5  CL 94*  --   --   --  95*  --  98 96*  CO2 27  --   --   --  26  --  25 31  GLUCOSE 167*  --   --   --  351*  --  248* 280*  BUN 43*  --   --   --  45*  --  43* 50*  CREATININE 1.53* 1.33*  --   --  1.28*  --  1.21 1.50*  CALCIUM 9.2  --   --   --   8.7*  --  8.5* 8.8*  MG  --   --  1.9 2.1  --  1.9  --  2.0  PHOS  --   --  4.0 3.9  --  3.4  --  3.3   GFR: Estimated Creatinine Clearance: 69.1 mL/min (A) (by C-G formula based on SCr of 1.5 mg/dL (H)). Liver Function Tests: Recent Labs  Lab 10/29/19 1955 10/31/19 1322  AST 34 40  ALT 46* 53*  ALKPHOS 47 47  BILITOT 1.0 0.5  PROT 6.4* 5.9*  ALBUMIN 2.5* 2.2*   No results for input(s): LIPASE, AMYLASE in the last 168 hours. No results for input(s): AMMONIA in the last 168 hours. Coagulation Profile: No results for input(s): INR, PROTIME in the last 168 hours. Cardiac Enzymes: No results for input(s): CKTOTAL, CKMB, CKMBINDEX, TROPONINI in the last 168 hours. BNP (last 3 results) No results for input(s): PROBNP in the last 8760 hours. HbA1C: No results for input(s): HGBA1C in the last 72 hours. CBG: Recent Labs  Lab 11/01/19 2009 11/02/19 0120 11/02/19 0414 11/02/19 0814 11/02/19 1212  GLUCAP 285* 247* 226* 247* 322*   Lipid Profile: No results for input(s): CHOL, HDL, LDLCALC, TRIG, CHOLHDL, LDLDIRECT in the last 72 hours. Thyroid Function Tests: No results for input(s): TSH, T4TOTAL, FREET4, T3FREE, THYROIDAB in the last 72 hours. Anemia Panel: Recent Labs    11/01/19 0250 11/02/19 0036  FERRITIN 504* 408*   Sepsis Labs: Recent Labs  Lab 11/01/19 1851 11/02/19 0042  LATICACIDVEN 2.4* 2.1*    Recent Results (from the past 240 hour(s))  Culture, blood (routine x 2)     Status: None (Preliminary result)   Collection Time: 11/01/19  7:00 PM   Specimen: BLOOD RIGHT HAND  Result  Value Ref Range Status   Specimen Description BLOOD RIGHT HAND  Final   Special Requests   Final    BOTTLES DRAWN AEROBIC ONLY Blood Culture adequate volume   Culture   Final    NO GROWTH < 24 HOURS Performed at [facility], [address] [place], [place] [address]    Report Status PENDING  Incomplete  Culture, blood (routine x 2)     Status: None (Preliminary  result)   Collection Time: 11/01/19  7:10 PM   Specimen: BLOOD RIGHT HAND  Result Value Ref Range Status   Specimen Description BLOOD RIGHT HAND  Final   Special Requests   Final    BOTTLES DRAWN AEROBIC ONLY Blood Culture adequate volume   Culture   Final    NO GROWTH < 24 HOURS Performed at [facility], [place] [address] [place], [place] [address]    Report Status PENDING  Incomplete         Radiology Studies: CT ANGIO CHEST PE W OR WO CONTRAST  Result Date: 11/01/2019 CLINICAL DATA:  69 year old male with shortness of breath. Positive COVID-19. EXAM: CT ANGIOGRAPHY CHEST WITH CONTRAST TECHNIQUE: Multidetector CT imaging of the chest was performed using the standard protocol during bolus administration of intravenous contrast. Multiplanar CT image reconstructions and MIPs were obtained to evaluate the vascular anatomy. CONTRAST:  40mL OMNIPAQUE IOHEXOL 350 MG/ML SOLN COMPARISON:  Chest CT dated 09/13/2019. FINDINGS: Cardiovascular: There is mild cardiomegaly. Three-vessel coronary vascular calcification. No pericardial effusion. There is moderate atherosclerotic calcification of the thoracic aorta. No aneurysmal dilatation. Evaluation of the pulmonary arteries is limited due to respiratory motion artifact and suboptimal visualization and opacification of the peripheral branches. No large central pulmonary artery embolus identified. Mediastinum/Nodes: There is no hilar or mediastinal adenopathy. The esophagus is grossly unremarkable. No mediastinal fluid collection. There is moderate mediastinal lipomatosis. Lungs/Pleura: Bilateral patchy and streaky airspace densities most consistent with multifocal pneumonia, likely viral or atypical in etiology. Clinical correlation is recommended. There is no pleural effusion or pneumothorax. The central airways are patent. Upper Abdomen: No acute abnormality. Musculoskeletal: Degenerative changes of the spine. No acute osseous pathology. Review of the  MIP images confirms the above findings. IMPRESSION: 1. No CT evidence of central pulmonary artery embolus. 2. Multifocal pneumonia, likely viral or atypical in etiology. Clinical correlation is recommended. 3. Cardiomegaly with 3 vessel coronary vascular calcification 4. Aortic Atherosclerosis (ICD10-I70.0). Electronically Signed   By: Anner Crete M.D.   On: 11/01/2019 00:14   DG CHEST PORT 1 VIEW  Result Date: 11/01/2019 CLINICAL DATA:  COVID-19 positive.  Shortness of breath. EXAM: PORTABLE CHEST 1 VIEW COMPARISON:  October 29, 2019 FINDINGS: Bilateral pulmonary infiltrates, particularly in the mid lower lungs, have worsened in the interval. No other changes. IMPRESSION: Worsening bilateral pulmonary infiltrates. Electronically Signed   By: Dorise Bullion III M.D   On: 11/01/2019 16:25        Scheduled Meds: . albuterol  2 puff Inhalation Q4H  . amLODipine  2.5 mg Oral QHS  . vitamin C  500 mg Oral Daily  . aspirin  81 mg Oral QHS  . carvedilol  50 mg Oral BID  . clopidogrel  75 mg Oral Daily  . dexamethasone (DECADRON) injection  6 mg Intravenous Q24H  . enoxaparin (LOVENOX) injection  0.5 mg/kg Subcutaneous Q24H  . famotidine  20 mg Oral QHS  . fluticasone  2 spray Each Nare TID  . folic acid  1 mg  Oral Daily  . icosapent Ethyl  2 g Oral TID  . insulin aspart  0-20 Units Subcutaneous Q4H  . insulin aspart  8 Units Subcutaneous TID WC  . insulin detemir  30 Units Subcutaneous BID  . multivitamin with minerals  1 tablet Oral Daily  . pregabalin  75 mg Oral Daily  . pregabalin  75 mg Oral QHS  . QUEtiapine  200 mg Oral QHS  . rosuvastatin  20 mg Oral Daily  . sodium chloride flush  3 mL Intravenous Q12H  . [START ON 11/03/2019] Vitamin D (Ergocalciferol)  50,000 Units Oral Q Mon  . zinc sulfate  220 mg Oral Daily   Continuous Infusions: . sodium chloride    . piperacillin-tazobactam (ZOSYN)  IV 3.375 g (11/02/19 1235)  . remdesivir 100 mg in NS 100 mL 100 mg (11/02/19 1054)      LOS: 4 days    Time spent: 33 minutes with over 50% of the time coordinating the patient's care    Harold Hedge, DO [facility] Pager [phone]  If 7PM-7AM, please contact night-coverage [email or web address] Password [email or web address] 11/02/2019, 1:57 PM

## 2019-11-03 DIAGNOSIS — N179 Acute kidney failure, unspecified: Secondary | ICD-10-CM

## 2019-11-03 LAB — GLUCOSE, CAPILLARY
Glucose-Capillary: 196 mg/dL — ABNORMAL HIGH (ref 70–99)
Glucose-Capillary: 208 mg/dL — ABNORMAL HIGH (ref 70–99)
Glucose-Capillary: 211 mg/dL — ABNORMAL HIGH (ref 70–99)
Glucose-Capillary: 225 mg/dL — ABNORMAL HIGH (ref 70–99)
Glucose-Capillary: 244 mg/dL — ABNORMAL HIGH (ref 70–99)
Glucose-Capillary: 245 mg/dL — ABNORMAL HIGH (ref 70–99)
Glucose-Capillary: 357 mg/dL — ABNORMAL HIGH (ref 70–99)

## 2019-11-03 LAB — CBC WITH DIFFERENTIAL/PLATELET
Abs Immature Granulocytes: 0.26 10*3/uL — ABNORMAL HIGH (ref 0.00–0.07)
Basophils Absolute: 0 10*3/uL (ref 0.0–0.1)
Basophils Relative: 0 %
Eosinophils Absolute: 0 10*3/uL (ref 0.0–0.5)
Eosinophils Relative: 0 %
HCT: 41.5 % (ref 39.0–52.0)
Hemoglobin: 13 g/dL (ref 13.0–17.0)
Immature Granulocytes: 5 %
Lymphocytes Relative: 13 %
Lymphs Abs: 0.7 10*3/uL (ref 0.7–4.0)
MCH: 29.3 pg (ref 26.0–34.0)
MCHC: 31.3 g/dL (ref 30.0–36.0)
MCV: 93.5 fL (ref 80.0–100.0)
Monocytes Absolute: 0.1 10*3/uL (ref 0.1–1.0)
Monocytes Relative: 2 %
Neutro Abs: 4.2 10*3/uL (ref 1.7–7.7)
Neutrophils Relative %: 80 %
Platelets: 157 10*3/uL (ref 150–400)
RBC: 4.44 MIL/uL (ref 4.22–5.81)
RDW: 16.3 % — ABNORMAL HIGH (ref 11.5–15.5)
WBC: 5.2 10*3/uL (ref 4.0–10.5)
nRBC: 0 % (ref 0.0–0.2)

## 2019-11-03 LAB — FERRITIN: Ferritin: 317 ng/mL (ref 24–336)

## 2019-11-03 LAB — BASIC METABOLIC PANEL
Anion gap: 12 (ref 5–15)
BUN: 48 mg/dL — ABNORMAL HIGH (ref 8–23)
CO2: 33 mmol/L — ABNORMAL HIGH (ref 22–32)
Calcium: 9 mg/dL (ref 8.9–10.3)
Chloride: 94 mmol/L — ABNORMAL LOW (ref 98–111)
Creatinine, Ser: 1.52 mg/dL — ABNORMAL HIGH (ref 0.61–1.24)
GFR calc Af Amer: 54 mL/min — ABNORMAL LOW (ref >60)
GFR calc non Af Amer: 47 mL/min — ABNORMAL LOW (ref >60)
Glucose, Bld: 238 mg/dL — ABNORMAL HIGH (ref 70–99)
Potassium: 4.4 mmol/L (ref 3.5–5.1)
Sodium: 139 mmol/L (ref 135–145)

## 2019-11-03 LAB — PHOSPHORUS: Phosphorus: 3.8 mg/dL (ref 2.5–4.6)

## 2019-11-03 LAB — D-DIMER, QUANTITATIVE: D-Dimer, Quant: 0.51 ug/mL-FEU — ABNORMAL HIGH (ref 0.00–0.50)

## 2019-11-03 LAB — MAGNESIUM: Magnesium: 1.9 mg/dL (ref 1.7–2.4)

## 2019-11-03 LAB — C-REACTIVE PROTEIN: CRP: 3 mg/dL — ABNORMAL HIGH (ref <1.0)

## 2019-11-03 MED ORDER — INSULIN DETEMIR 100 UNIT/ML ~~LOC~~ SOLN
35.0000 [IU] | Freq: Two times a day (BID) | SUBCUTANEOUS | Status: DC
  Administered 2019-11-03: 35 [IU] via SUBCUTANEOUS
  Filled 2019-11-03 (×3): qty 0.35

## 2019-11-03 MED ORDER — INSULIN ASPART 100 UNIT/ML ~~LOC~~ SOLN
15.0000 [IU] | Freq: Three times a day (TID) | SUBCUTANEOUS | Status: DC
  Administered 2019-11-03 – 2019-11-06 (×9): 15 [IU] via SUBCUTANEOUS

## 2019-11-03 MED ORDER — FUROSEMIDE 10 MG/ML IJ SOLN
40.0000 mg | Freq: Once | INTRAMUSCULAR | Status: AC
  Administered 2019-11-03: 40 mg via INTRAVENOUS
  Filled 2019-11-03: qty 4

## 2019-11-03 MED ORDER — PREGABALIN 50 MG PO CAPS
50.0000 mg | ORAL_CAPSULE | Freq: Two times a day (BID) | ORAL | Status: DC
  Administered 2019-11-03 – 2019-11-10 (×15): 50 mg via ORAL
  Filled 2019-11-03 (×15): qty 1

## 2019-11-03 NOTE — Progress Notes (Signed)
PROGRESS NOTE    Reginald Duncan    Code Status: Full Code  [id number] DOB: Mar 01, 1951 DOA: 10/29/2019  PCP: [name], PA    Hospital Summary  This is a 69 year old male, history of obesity, CAD s/p DES 2014, COPD, OSA, CKD 3a, chronic pain, PVD, diabetes, hypertension, CVA, hyperlipidemia, anxiety who presented with worsening shortness of breath times several weeks and new onset chest pain with associated wheezing and fever found to be Covid positive, febrile and tachycardic in ED as well as hypoxic requiring nasal cannula O2 and started on Decadron and remdesivir.  CRP noted to be elevated and patient started on Actemra x1 after risk/benefit discussion with patient on 1/7.  Also started on Ativan 1 mg as needed twice daily for severe anxiety.  Patient was offered to be transferred to [facility] however stated he wished to stay at [facility] for further treatment. Does not wish to be transferred to [facility].  1/9: Patient continued to have decline in respiratory status despite Actemra x1 and 4 days remdesivir and Decadron.  Pulmonology consulted.    A & P   Active Problems:   Acute on chronic respiratory failure ([place])    1. Acute hypoxic respiratory failure secondary to COVID-19 with volume overload, concern for progression of COVID-19 pneumonia a. Symptomatically improved b. Increased O2 requirement today (12L/min O2 via HFNC), not tolerating nonrebreather mask, not tolerating pronation c. Pulmonology consulted 1/9 d. Status post Actemra 1/7 x 1 e. Pleated 5 days remdesivir  f. Decadron Day 6/10 g. Continue IV Diuresis  h. CTA Chest: negative for PE, with multifocal pneumonia likely viral or atypical etiology i. Combivent every 6 hours and continue as needed albuterol inhaler j. Incentive spirometry k. PT/OT 2. Volume Overload a. Echo 07/2018: 55-60% b. On PO lasix at home c. Follow-up Limited Echo d. Continue IV diuresis e. I/O and Daily weights  3. Sepsis secondary to  COVID 19 with initial concern for underlying HAP a. Hypothermic, qSOFA: 2 (RR 31, SBP 90) - high risk, CT chest with multifocal pna, no PE b. MRSA nares negative c. No secondary pneumonia per pulmonology on CT scan, off Zosyn and monitor.  d. Check sputum culture if fever 4. Lethargy, likely multifactorial: polypharmacy (seroquel/lyrica/xanax), hypoxia a. Improved b. will continue to decrease meds 5. COPD, not in exacerbation a. Continue IV steroids and inhalers b. Pulm Consulted: Combivent inhaler with technique/strength assessment at bedside by RT 6. Severe anxiety over healthcare a. On xanax per outpatient regimen b. Continue decreased seroquel 200 mg QHS  7. Neuropathy a. Continue to decrease Lyrica due to lethargy (previously on 75 mg in a.m. and 50 mg in p.m.) 8. Thrombocytopenia, likely reactive 9. Hypoglycemia a. Resolved with D5W, now hyperglycemic in setting of steroids 10. Diabetes with hyperglycemia  a. HA1C 10.0 b. Diabetic coordinator: Increase Levemir to 35 units twice daily and NovoLog 15 units 3 times daily while on steroids 11. OSA a. CPAP on hold due to COVID 19 protocol 12. AKI on CKD 3a  a. 1.5 to up from 1.2 b. Continue to diuresis follow-up in a.m. 13. Chest pain: More likely MSK vs anxiety a. Well Score: 0, low risk, PERC: cannot rule out PE, D Dimer negative b. CTA without PE, with multifocal pneumonia 14. Morbid obesity a. Outpatient management 15. Hypertension a. Continue amlodipine and carvedilol 16. CAD s/p DES 2014 a. Status post left heart cath 09/01/2019 b. Continue dual antiplatelets and statin     DVT prophylaxis: Lovenox  Diet: Carb modified Family Communication: discussed with brother over the phone yesterday Disposition Plan: Barrier to discharge is medical stability, continue discharge planning  Consultants  Pulmonology  Procedures  None   Antibiotics   Anti-infectives (From admission, onward)   Start     Dose/Rate Route  Frequency Ordered Stop   11/01/19 1630  piperacillin-tazobactam (ZOSYN) IVPB 3.375 g  Status:  Discontinued     3.375 g 12.5 mL/hr over 240 Minutes Intravenous Every 8 hours 11/01/19 1609 11/03/19 1244   10/31/19 1000  remdesivir 100 mg in sodium chloride 0.9 % 100 mL IVPB  Status:  Discontinued     100 mg 200 mL/hr over 30 Minutes Intravenous Daily 10/30/19 0058 10/30/19 0154   10/31/19 1000  remdesivir 100 mg in sodium chloride 0.9 % 100 mL IVPB     100 mg 200 mL/hr over 30 Minutes Intravenous Every 24 hours 10/30/19 0837 11/03/19 1020   10/30/19 2200  remdesivir 100 mg in sodium chloride 0.9 % 100 mL IVPB  Status:  Discontinued     100 mg 200 mL/hr over 30 Minutes Intravenous Daily 10/29/19 2257 10/30/19 0835   10/30/19 0200  azithromycin (ZITHROMAX) 500 mg in sodium chloride 0.9 % 250 mL IVPB  Status:  Discontinued     500 mg 250 mL/hr over 60 Minutes Intravenous Every 24 hours 10/30/19 0058 10/31/19 1643   10/30/19 0100  remdesivir 200 mg in sodium chloride 0.9% 250 mL IVPB  Status:  Discontinued     200 mg 580 mL/hr over 30 Minutes Intravenous Once 10/30/19 0058 10/30/19 0404   10/29/19 2300  remdesivir 200 mg in sodium chloride 0.9% 250 mL IVPB  Status:  Discontinued     200 mg 580 mL/hr over 30 Minutes Intravenous Once 10/29/19 2257 10/30/19 0835           Subjective   Sitting upright in bed but sleeping and slouched over.  Arousable and awakened easily.  Reports persistent but slightly improved symptoms of shortness of breath and cough.  Denies any chest pain, nausea, vomiting or any other complaints at this time.  Urinating well.  Objective   Vitals:   11/02/19 2100 11/02/19 2300 11/03/19 0429 11/03/19 0500  BP:   132/88   Pulse:  70 71   Resp:  20 16   Temp: 99.1 F (37.3 C)  97.8 F (36.6 C)   TempSrc: Oral  Oral   SpO2:   (!) 88%   Weight:    135.4 kg    Intake/Output Summary (Last 24 hours) at 11/03/2019 1505 Last data filed at 11/03/2019 0900 Gross per  24 hour  Intake 490 ml  Output 700 ml  Net -210 ml   Filed Weights   10/31/19 0258 11/01/19 0445 11/03/19 0500  Weight: 134.7 kg 136 kg 135.4 kg    Examination:  Physical Exam Vitals and nursing note reviewed.  Constitutional:      Appearance: He is ill-appearing.  Eyes:     Pupils: Pupils are equal, round, and reactive to light.  Cardiovascular:     Rate and Rhythm: Normal rate.     Pulses: Normal pulses.  Pulmonary:     Effort: No tachypnea or accessory muscle usage.  Musculoskeletal:     Comments: Bilateral lower extremity trace to 1+ pitting edema  Skin:    General: Skin is warm.  Neurological:     General: No focal deficit present.     Mental Status: He is alert.  Psychiatric:  Mood and Affect: Mood is not anxious.     Data Reviewed: I have personally reviewed following labs and imaging studies  CBC: Recent Labs  Lab 10/30/19 0451 10/31/19 0733 11/01/19 0250 11/02/19 0036 11/03/19 0646  WBC 5.1 4.4 4.6 4.7 5.2  NEUTROABS 4.5 3.7 3.9 4.0 4.2  HGB 12.0* 11.6* 11.4* 12.5* 13.0  HCT 37.3* 34.5* 34.9* 39.2 41.5  MCV 93.5 90.8 91.6 92.5 93.5  PLT 104* 111* 111* 124* [id number]   Basic Metabolic Panel: Recent Labs  Lab 10/29/19 1955 10/30/19 0059 10/30/19 0451 10/31/19 0733 10/31/19 1322 11/01/19 0250 11/01/19 0910 11/02/19 0036 11/03/19 0646  NA 138  --   --   --  133*  --  135 138 139  K 3.6  --   --   --  4.4  --  4.2 4.5 4.4  CL 94*  --   --   --  95*  --  98 96* 94*  CO2 27  --   --   --  26  --  25 31 33*  GLUCOSE 167*  --   --   --  351*  --  248* 280* 238*  BUN 43*  --   --   --  45*  --  43* 50* 48*  CREATININE 1.53* 1.33*  --   --  1.28*  --  1.21 1.50* 1.52*  CALCIUM 9.2  --   --   --  8.7*  --  8.5* 8.8* 9.0  MG  --   --  1.9 2.1  --  1.9  --  2.0 1.9  PHOS  --   --  4.0 3.9  --  3.4  --  3.3 3.8   GFR: Estimated Creatinine Clearance: 68.1 mL/min (A) (by C-G formula based on SCr of 1.52 mg/dL (H)). Liver Function Tests: Recent  Labs  Lab 10/29/19 1955 10/31/19 1322  AST 34 40  ALT 46* 53*  ALKPHOS 47 47  BILITOT 1.0 0.5  PROT 6.4* 5.9*  ALBUMIN 2.5* 2.2*   No results for input(s): LIPASE, AMYLASE in the last 168 hours. No results for input(s): AMMONIA in the last 168 hours. Coagulation Profile: No results for input(s): INR, PROTIME in the last 168 hours. Cardiac Enzymes: No results for input(s): CKTOTAL, CKMB, CKMBINDEX, TROPONINI in the last 168 hours. BNP (last 3 results) No results for input(s): PROBNP in the last 8760 hours. HbA1C: No results for input(s): HGBA1C in the last 72 hours. CBG: Recent Labs  Lab 11/02/19 2053 11/03/19 0012 11/03/19 0432 11/03/19 0815 11/03/19 1224  GLUCAP 254* 208* 211* 225* 357*   Lipid Profile: No results for input(s): CHOL, HDL, LDLCALC, TRIG, CHOLHDL, LDLDIRECT in the last 72 hours. Thyroid Function Tests: No results for input(s): TSH, T4TOTAL, FREET4, T3FREE, THYROIDAB in the last 72 hours. Anemia Panel: Recent Labs    11/02/19 0036 11/03/19 0646  FERRITIN 408* 317   Sepsis Labs: Recent Labs  Lab 11/01/19 1851 11/02/19 0042  LATICACIDVEN 2.4* 2.1*    Recent Results (from the past 240 hour(s))  Culture, blood (routine x 2)     Status: None (Preliminary result)   Collection Time: 11/01/19  7:00 PM   Specimen: BLOOD RIGHT HAND  Result Value Ref Range Status   Specimen Description BLOOD RIGHT HAND  Final   Special Requests   Final    BOTTLES DRAWN AEROBIC ONLY Blood Culture adequate volume   Culture   Final    NO GROWTH 2 DAYS Performed  at [facility], [place] [address], [place], [place] [address]    Report Status PENDING  Incomplete  Culture, blood (routine x 2)     Status: None (Preliminary result)   Collection Time: 11/01/19  7:10 PM   Specimen: BLOOD RIGHT HAND  Result Value Ref Range Status   Specimen Description BLOOD RIGHT HAND  Final   Special Requests   Final    BOTTLES DRAWN AEROBIC ONLY Blood Culture adequate volume    Culture   Final    NO GROWTH 2 DAYS Performed at [facility], [place] [address] [place], [place] [address]    Report Status PENDING  Incomplete         Radiology Studies: DG CHEST PORT 1 VIEW  Result Date: 11/01/2019 CLINICAL DATA:  COVID-19 positive.  Shortness of breath. EXAM: PORTABLE CHEST 1 VIEW COMPARISON:  October 29, 2019 FINDINGS: Bilateral pulmonary infiltrates, particularly in the mid lower lungs, have worsened in the interval. No other changes. IMPRESSION: Worsening bilateral pulmonary infiltrates. Electronically Signed   By: Dorise Bullion III M.D   On: 11/01/2019 16:25   ECHOCARDIOGRAM LIMITED  Result Date: 11/02/2019   ECHOCARDIOGRAM LIMITED REPORT   Patient Name:   MACKINLEY BULLEN Date of Exam: 11/02/2019 Medical Rec #:  [id number]     Height:       73.0 in Accession #:    [id number]    Weight:       299.8 lb Date of Birth:  August 08, 1951     BSA:          2.56 m Patient Age:    25 years      BP:           124/82 mmHg Patient Gender: M             HR:           66 bpm. Exam Location:  Inpatient  Procedure: Limited Echo, Limited Color Doppler, Cardiac Doppler and Intracardiac            Opacification Agent Indications:    dyspnea 786.09  History:        Patient has prior history of Echocardiogram examinations, most                 recent 08/13/2018.  Sonographer:    Johny Chess Referring Phys: [id number] Caylon Saine E Lannie Heaps  Sonographer Comments: Suboptimal apical window and Technically challenging study due to limited acoustic windows. Image acquisition challenging due to respiratory motion. IMPRESSIONS  1. Technically difficult study with limited views even after administration of contrast. Left ventricular ejection fraction, by visual estimation, is 60 to 65%. The left ventricle has normal function. There is mildly increased left ventricular wall thickness.  2. Right ventricle was not well visualized. On limited views appears mildly dilated with grossly normal systolic function  3. The  mitral valve is normal in structure. No evidence of mitral valve regurgitation.  4. The tricuspid valve was grossly normal. Tricuspid valve regurgitation is not demonstrated.  5. The aortic valve was not well visualized. Aortic valve regurgitation is not visualized.  6. TR signal is inadequate for assessing pulmonary artery systolic pressure. FINDINGS  Left Ventricle: Left ventricular ejection fraction, by visual estimation, is 60 to 65%. The left ventricle has normal function. There is mildly increased left ventricular wall thickness. Right Ventricle: The right ventricular size is NWV. Right vetricular wall thickness was not assessed. Global RV systolic function is was not well visualized.  Left Atrium: Left atrial size was not well visualized. Right Atrium: Right atrial size was not well visualized. Right atrial pressure is estimated at 3 mmHg. Pericardium: Trivial pericardial effusion is present is seen. Trivial pericardial effusion is present. Mitral Valve: The mitral valve is normal in structure. No evidence of mitral valve regurgitation. Tricuspid Valve: The tricuspid valve is grossly normal. Tricuspid valve regurgitation is not demonstrated. Aortic Valve: The aortic valve was not well visualized. Aortic valve regurgitation is not visualized. Pulmonic Valve: The pulmonic valve was not well visualized. Pulmonic valve regurgitation is not visualized by color flow Doppler. Pulmonic regurgitation is not visualized by color flow Doppler. Aorta: The aortic root is normal in size and structure. Shunts: The interatrial septum was not well visualized.  LEFT VENTRICLE          Normals PLAX 2D LVIDd:         5.20 cm  3.6 cm LVIDs:         3.00 cm  1.7 cm LV PW:         1.20 cm  1.4 cm LV IVS:        1.10 cm  1.3 cm LVOT diam:     2.10 cm  2.0 cm LV SV:         95 ml    79 ml LV SV Index:   34.99    45 ml/m2 LVOT Area:     3.46 cm 3.14 cm2  LEFT ATRIUM         Index LA diam:    3.60 cm 1.41 cm/m   AORTA                  Normals Ao Root diam: 3.70 cm 31 mm  SHUNTS Systemic Diam: 2.10 cm  Oswaldo Milian MD Electronically signed by Oswaldo Milian MD Signature Date/Time: 11/02/2019/4:13:57 PMThe mitral valve is normal in structure.    Final         Scheduled Meds: . amLODipine  2.5 mg Oral QHS  . vitamin C  500 mg Oral Daily  . aspirin  81 mg Oral QHS  . carvedilol  50 mg Oral BID  . clopidogrel  75 mg Oral Daily  . dexamethasone (DECADRON) injection  6 mg Intravenous Q24H  . enoxaparin (LOVENOX) injection  0.5 mg/kg Subcutaneous Q24H  . famotidine  20 mg Oral QHS  . fluticasone  2 spray Each Nare TID  . folic acid  1 mg Oral Daily  . icosapent Ethyl  2 g Oral TID  . insulin aspart  0-20 Units Subcutaneous Q4H  . insulin aspart  15 Units Subcutaneous TID WC  . insulin detemir  35 Units Subcutaneous BID  . Ipratropium-Albuterol  1 puff Inhalation Q6H  . multivitamin with minerals  1 tablet Oral Daily  . pregabalin  75 mg Oral Daily  . pregabalin  75 mg Oral QHS  . QUEtiapine  200 mg Oral QHS  . rosuvastatin  20 mg Oral Daily  . sodium chloride flush  3 mL Intravenous Q12H  . Vitamin D (Ergocalciferol)  50,000 Units Oral Q Mon  . zinc sulfate  220 mg Oral Daily   Continuous Infusions: . sodium chloride       LOS: 5 days    Time spent: 30 minutes with over 50% of the time coordinating the patient's care    Harold Hedge, DO [facility] Pager [phone]  If 7PM-7AM, please contact night-coverage [email or web address] Password [facility] 11/03/2019, 3:05 PM

## 2019-11-03 NOTE — Progress Notes (Signed)
Inpatient Diabetes Program Recommendations  AACE/ADA: New Consensus Statement on Inpatient Glycemic Control   Target Ranges:  Prepandial:   less than 140 mg/dL      Peak postprandial:   less than 180 mg/dL (1-2 hours)      Critically ill patients:  140 - 180 mg/dL   Results for DEMETRION, BLUNK (MRN [id number]) as of 11/03/2019 09:49  Ref. Range 11/02/2019 08:14 11/02/2019 12:12 11/02/2019 16:10 11/02/2019 20:53 11/03/2019 00:12 11/03/2019 04:32 11/03/2019 08:15  Glucose-Capillary Latest Ref Range: 70 - 99 mg/dL 247 (H)  Novolog 15 units  Levemir 30 units 322 (H)  Novolog 23 units 291 (H)  Novolog 19 units 254 (H)  Novolog 11 units  Levemir 30 units 208 (H)  Novolog 7 units 211 (H)  Novolog 7 units 225 (H)  Novolog 15 units   Review of Glycemic Control  Diabetes history: DM2 Outpatient Diabetes medications: Humulin R U500 30 units BID (uses a U100 insulin syringe, dose would be 150 units of U500) Current orders for Inpatient glycemic control: Levemir 30 units BID, Novolog 8 units TID with meals, Novolog 0-20 units Q4H; Decadron 6 mg Q24H  Inpatient Diabetes Program Recommendations:   Insulin - Basal: Please consider increasing Levemir to 35 units BID.  Insulin - Meal Coverage: If steroids are continued, please consider increasing meal coverage to Novolog 15 units TID with meals if patient eats at least 50% of meals.  Thanks, Barnie Alderman, RN, MSN, CDE Diabetes Coordinator Inpatient Diabetes Program [phone] (Team Pager from 8am to 5pm)

## 2019-11-04 ENCOUNTER — Ambulatory Visit: Payer: Medicare HMO | Admitting: Physician Assistant

## 2019-11-04 LAB — CBC
HCT: 41.6 % (ref 39.0–52.0)
Hemoglobin: 13.4 g/dL (ref 13.0–17.0)
MCH: 30 pg (ref 26.0–34.0)
MCHC: 32.2 g/dL (ref 30.0–36.0)
MCV: 93.1 fL (ref 80.0–100.0)
Platelets: 150 10*3/uL (ref 150–400)
RBC: 4.47 MIL/uL (ref 4.22–5.81)
RDW: 16 % — ABNORMAL HIGH (ref 11.5–15.5)
WBC: 6.3 10*3/uL (ref 4.0–10.5)
nRBC: 0.5 % — ABNORMAL HIGH (ref 0.0–0.2)

## 2019-11-04 LAB — MAGNESIUM: Magnesium: 1.9 mg/dL (ref 1.7–2.4)

## 2019-11-04 LAB — BASIC METABOLIC PANEL
Anion gap: 8 (ref 5–15)
BUN: 51 mg/dL — ABNORMAL HIGH (ref 8–23)
CO2: 36 mmol/L — ABNORMAL HIGH (ref 22–32)
Calcium: 9.2 mg/dL (ref 8.9–10.3)
Chloride: 96 mmol/L — ABNORMAL LOW (ref 98–111)
Creatinine, Ser: 1.55 mg/dL — ABNORMAL HIGH (ref 0.61–1.24)
GFR calc Af Amer: 53 mL/min — ABNORMAL LOW (ref >60)
GFR calc non Af Amer: 46 mL/min — ABNORMAL LOW (ref >60)
Glucose, Bld: 131 mg/dL — ABNORMAL HIGH (ref 70–99)
Potassium: 4.5 mmol/L (ref 3.5–5.1)
Sodium: 140 mmol/L (ref 135–145)

## 2019-11-04 LAB — GLUCOSE, CAPILLARY
Glucose-Capillary: 120 mg/dL — ABNORMAL HIGH (ref 70–99)
Glucose-Capillary: 139 mg/dL — ABNORMAL HIGH (ref 70–99)
Glucose-Capillary: 237 mg/dL — ABNORMAL HIGH (ref 70–99)
Glucose-Capillary: 330 mg/dL — ABNORMAL HIGH (ref 70–99)
Glucose-Capillary: 170 mg/dL — ABNORMAL HIGH (ref 70–99)
Glucose-Capillary: 240 mg/dL — ABNORMAL HIGH (ref 70–99)

## 2019-11-04 MED ORDER — INSULIN DETEMIR 100 UNIT/ML ~~LOC~~ SOLN
40.0000 [IU] | Freq: Two times a day (BID) | SUBCUTANEOUS | Status: DC
  Administered 2019-11-04 – 2019-11-06 (×5): 40 [IU] via SUBCUTANEOUS
  Filled 2019-11-04 (×7): qty 0.4

## 2019-11-04 NOTE — Progress Notes (Signed)
Inpatient Diabetes Program Recommendations  AACE/ADA: New Consensus Statement on Inpatient Glycemic Control   Target Ranges:  Prepandial:   less than 140 mg/dL      Peak postprandial:   less than 180 mg/dL (1-2 hours)      Critically ill patients:  140 - 180 mg/dL  Results for Reginald Duncan, Reginald Duncan (MRN [id number]) as of 11/04/2019 08:14  Ref. Range 11/03/2019 08:15 11/03/2019 12:24 11/03/2019 17:07 11/03/2019 19:59 11/03/2019 23:39 11/04/2019 03:17 11/04/2019 03:36 11/04/2019 08:09  Glucose-Capillary Latest Ref Range: 70 - 99 mg/dL 225 (H) 357 (H) 245 (H) 244 (H) 196 (H) 120 (H) 139 (H) 237 (H)    Review of Glycemic Control  Diabetes history: DM2 Outpatient Diabetes medications: Humulin R U50030units BID(uses aU100 insulin syringe, dose would be 150units of U500) Current orders for Inpatient glycemic control: Levemir 35 units BID, Novolog 15 units TID with meals, Novolog 0-20 units Q4H; Decadron 6 mg Q24H  Inpatient Diabetes Program Recommendations:   Insulin - Basal: If steroids are continued, please consider increasing Levemir to 40 units BID.  Thanks, Barnie Alderman, RN, MSN, CDE Diabetes Coordinator Inpatient Diabetes Program [phone] (Team Pager from 8am to 5pm)

## 2019-11-04 NOTE — Plan of Care (Signed)

## 2019-11-04 NOTE — Progress Notes (Signed)
PROGRESS NOTE    Reginald Duncan    Code Status: Full Code  [id number] DOB: 07-Jul-1951 DOA: 10/29/2019  PCP: [name], PA    Hospital Summary  This is a 69 year old male, history of obesity, CAD s/p DES 2014, COPD, OSA, CKD 3a, chronic pain, PVD, diabetes, hypertension, CVA, hyperlipidemia, anxiety who presented with worsening shortness of breath times several weeks and new onset chest pain with associated wheezing and fever found to be Covid positive, febrile and tachycardic in ED as well as hypoxic requiring nasal cannula O2 and started on Decadron and remdesivir.  CRP noted to be elevated and patient started on Actemra x1 after risk/benefit discussion with patient on 1/7.  Also started on Ativan 1 mg as needed twice daily for severe anxiety.  Patient was offered to be transferred to [facility] however stated he wished to stay at [facility] for further treatment. Does not wish to be transferred to [facility].  1/9: Patient continued to have decline in respiratory status despite Actemra x1 and 4 days remdesivir and Decadron.  Pulmonology consulted.    A & P   Active Problems:   Acute on chronic respiratory failure ([place])    1. Acute hypoxic respiratory failure secondary to COVID-19 with volume overload a. Continues to have symptomatic improvement b. CTA Chest: negative for PE, with multifocal pneumonia likely viral or atypical etiology c. 2 requirement 12 L/min-> 10 L/min HFNC d. Pulmonology consulted 1/9 without much to add e. Status post Actemra 1/7 x 1 f. Completed 5 days remdesivir  g. Decadron Day 7/10 h. Diuresed well with IV Lasix, hold diuresis today due to AKI and reassess in a.m. i. Combivent every 6 hours and continue as needed albuterol inhaler j. Incentive spirometry 2. Volume Overload a. On PO lasix at home b. Limited echo with improved EF from prior c. Good diuresis with IV Lasix, net -8.5 L since admit.  Holding Lasix due to AKI and reassess in a.m. d. I/O and  Daily weights  3. Sepsis secondary to COVID 19 with initial concern for underlying HAP a. Previously hypothermic, qSOFA: 2 (RR 31, SBP 90) - high risk, CT chest with multifocal pna, no PE b. Sepsis has since resolved c. MRSA nares negative d. No secondary pneumonia per pulmonology on CT scan, off Zosyn and monitor.  e. Check sputum culture if fever 4. Lethargy, likely multifactorial: polypharmacy (seroquel/lyrica/xanax), hypoxia a. Improved with med rec 5. COPD, not in exacerbation a. Continue IV steroids and inhalers b. Pulm Consulted: Combivent inhaler with technique/strength assessment at bedside by RT 6. Severe anxiety over healthcare a. On xanax per outpatient regimen b. Continue decreased seroquel 200 mg QHS  7. Neuropathy a. Continue to decrease Lyrica due to lethargy 8. Thrombocytopenia, likely reactive, resolved 9. Hypoglycemia a. Resolved with D5W, now hyperglycemic in setting of steroids 10. Diabetes with hyperglycemia  a. HA1C 10.0 b. Diabetic coordinator: Increase Levemir to 40 units twice daily and continue NovoLog 15 units 3 times daily while on steroids 11. OSA a. CPAP on hold due to COVID 19 protocol 12. AKI on CKD 3a  a. Creatinine 1.5, baseline 1.2-1.3 b. Holding lisinopril c. Holding Lasix today, follow-up in a.m. 13. Chest pain: More likely MSK and anxiety  a. Well Score: 0, low risk, PERC: cannot rule out PE, D Dimer negative b. CTA without PE, with multifocal pneumonia 14. Morbid obesity a. Outpatient management 15. Hypertension a. Continue amlodipine and carvedilol 16. CAD s/p DES 2014 a. Status post left  heart cath 09/01/2019 b. Continue dual antiplatelets and statin     DVT prophylaxis: Lovenox Diet: Carb modified Family Communication: discussed with brother over the phone yesterday Disposition Plan: Barrier to discharge is improved clinical stability.  Patient will need SNF at discharge  Consultants  Pulmonology  Procedures  None    Antibiotics   Anti-infectives (From admission, onward)   Start     Dose/Rate Route Frequency Ordered Stop   11/01/19 1630  piperacillin-tazobactam (ZOSYN) IVPB 3.375 g  Status:  Discontinued     3.375 g 12.5 mL/hr over 240 Minutes Intravenous Every 8 hours 11/01/19 1609 11/03/19 1244   10/31/19 1000  remdesivir 100 mg in sodium chloride 0.9 % 100 mL IVPB  Status:  Discontinued     100 mg 200 mL/hr over 30 Minutes Intravenous Daily 10/30/19 0058 10/30/19 0154   10/31/19 1000  remdesivir 100 mg in sodium chloride 0.9 % 100 mL IVPB     100 mg 200 mL/hr over 30 Minutes Intravenous Every 24 hours 10/30/19 0837 11/03/19 1057   10/30/19 2200  remdesivir 100 mg in sodium chloride 0.9 % 100 mL IVPB  Status:  Discontinued     100 mg 200 mL/hr over 30 Minutes Intravenous Daily 10/29/19 2257 10/30/19 0835   10/30/19 0200  azithromycin (ZITHROMAX) 500 mg in sodium chloride 0.9 % 250 mL IVPB  Status:  Discontinued     500 mg 250 mL/hr over 60 Minutes Intravenous Every 24 hours 10/30/19 0058 10/31/19 1643   10/30/19 0100  remdesivir 200 mg in sodium chloride 0.9% 250 mL IVPB  Status:  Discontinued     200 mg 580 mL/hr over 30 Minutes Intravenous Once 10/30/19 0058 10/30/19 0404   10/29/19 2300  remdesivir 200 mg in sodium chloride 0.9% 250 mL IVPB  Status:  Discontinued     200 mg 580 mL/hr over 30 Minutes Intravenous Once 10/29/19 2257 10/30/19 0835           Subjective   Sitting upright in bedside chair.  Reports some improved symptoms and shortness of breath.  More comfortable today than yesterday.  States he wakes up short of breath frequently and has a distant history of OSA but has not used a CPAP in years.  Denies any chest pain, nausea or vomiting.  Denies any other complaints  Objective   Vitals:   11/04/19 0434 11/04/19 0651 11/04/19 1403 11/04/19 1453  BP: 113/65   (!) 102/50  Pulse: 72 75  73  Resp: (!) 23   13  Temp: 98 F (36.7 C)   97.8 F (36.6 C)  TempSrc: Oral    Oral  SpO2: 90%  (!) 80% 92%  Weight:  131 kg      Intake/Output Summary (Last 24 hours) at 11/04/2019 1826 Last data filed at 11/04/2019 1158 Gross per 24 hour  Intake 506 ml  Output 1300 ml  Net -794 ml   Filed Weights   11/01/19 0445 11/03/19 0500 11/04/19 0651  Weight: 136 kg 135.4 kg 131 kg    Examination:  Physical Exam Vitals and nursing note reviewed.  Constitutional:      Appearance: He is obese.  HENT:     Head: Normocephalic.  Cardiovascular:     Rate and Rhythm: Normal rate and regular rhythm.  Pulmonary:     Effort: Pulmonary effort is normal. No tachypnea or accessory muscle usage.     Comments: High flow nasal cannula Musculoskeletal:     Comments: Bilateral lower extremity 1+  edema  Neurological:     Mental Status: He is alert and oriented to person, place, and time.  Psychiatric:        Mood and Affect: Mood normal. Mood is not anxious.     Data Reviewed: I have personally reviewed following labs and imaging studies  CBC: Recent Labs  Lab 10/30/19 0451 10/31/19 0733 11/01/19 0250 11/02/19 0036 11/03/19 0646 11/04/19 0419  WBC 5.1 4.4 4.6 4.7 5.2 6.3  NEUTROABS 4.5 3.7 3.9 4.0 4.2  --   HGB 12.0* 11.6* 11.4* 12.5* 13.0 13.4  HCT 37.3* 34.5* 34.9* 39.2 41.5 41.6  MCV 93.5 90.8 91.6 92.5 93.5 93.1  PLT 104* 111* 111* 124* 157 [id number]   Basic Metabolic Panel: Recent Labs  Lab 10/29/19 1955 10/30/19 0451 10/31/19 0733 10/31/19 1322 11/01/19 0250 11/01/19 0910 11/02/19 0036 11/03/19 0646 11/04/19 0419  NA  --   --   --  133*  --  135 138 139 140  K  --   --   --  4.4  --  4.2 4.5 4.4 4.5  CL  --   --   --  95*  --  98 96* 94* 96*  CO2  --   --   --  26  --  25 31 33* 36*  GLUCOSE  --   --   --  351*  --  248* 280* 238* 131*  BUN  --   --   --  45*  --  43* 50* 48* 51*  CREATININE   < >  --   --  1.28*  --  1.21 1.50* 1.52* 1.55*  CALCIUM  --   --   --  8.7*  --  8.5* 8.8* 9.0 9.2  MG  --  1.9 2.1  --  1.9  --  2.0 1.9 1.9  PHOS  --   4.0 3.9  --  3.4  --  3.3 3.8  --    < > = values in this interval not displayed.   GFR: Estimated Creatinine Clearance: 65.6 mL/min (A) (by C-G formula based on SCr of 1.55 mg/dL (H)). Liver Function Tests: Recent Labs  Lab 10/29/19 1955 10/31/19 1322  AST 34 40  ALT 46* 53*  ALKPHOS 47 47  BILITOT 1.0 0.5  PROT 6.4* 5.9*  ALBUMIN 2.5* 2.2*   No results for input(s): LIPASE, AMYLASE in the last 168 hours. No results for input(s): AMMONIA in the last 168 hours. Coagulation Profile: No results for input(s): INR, PROTIME in the last 168 hours. Cardiac Enzymes: No results for input(s): CKTOTAL, CKMB, CKMBINDEX, TROPONINI in the last 168 hours. BNP (last 3 results) No results for input(s): PROBNP in the last 8760 hours. HbA1C: No results for input(s): HGBA1C in the last 72 hours. CBG: Recent Labs  Lab 11/04/19 0317 11/04/19 0336 11/04/19 0809 11/04/19 1123 11/04/19 1709  GLUCAP 120* 139* 237* 330* 170*   Lipid Profile: No results for input(s): CHOL, HDL, LDLCALC, TRIG, CHOLHDL, LDLDIRECT in the last 72 hours. Thyroid Function Tests: No results for input(s): TSH, T4TOTAL, FREET4, T3FREE, THYROIDAB in the last 72 hours. Anemia Panel: Recent Labs    11/02/19 0036 11/03/19 0646  FERRITIN 408* 317   Sepsis Labs: Recent Labs  Lab 11/01/19 1851 11/02/19 0042  LATICACIDVEN 2.4* 2.1*    Recent Results (from the past 240 hour(s))  Culture, blood (routine x 2)     Status: None (Preliminary result)   Collection Time: 11/01/19  7:00 PM  Specimen: BLOOD RIGHT HAND  Result Value Ref Range Status   Specimen Description BLOOD RIGHT HAND  Final   Special Requests   Final    BOTTLES DRAWN AEROBIC ONLY Blood Culture adequate volume   Culture   Final    NO GROWTH 3 DAYS Performed at [facility], [address] [place], [place] [address]    Report Status PENDING  Incomplete  Culture, blood (routine x 2)     Status: None (Preliminary result)   Collection Time:  11/01/19  7:10 PM   Specimen: BLOOD RIGHT HAND  Result Value Ref Range Status   Specimen Description BLOOD RIGHT HAND  Final   Special Requests   Final    BOTTLES DRAWN AEROBIC ONLY Blood Culture adequate volume   Culture   Final    NO GROWTH 3 DAYS Performed at [facility], [place] [address], [place],  [address]    Report Status PENDING  Incomplete         Radiology Studies: No results found.      Scheduled Meds: . amLODipine  2.5 mg Oral QHS  . vitamin C  500 mg Oral Daily  . aspirin  81 mg Oral QHS  . carvedilol  50 mg Oral BID  . clopidogrel  75 mg Oral Daily  . dexamethasone (DECADRON) injection  6 mg Intravenous Q24H  . enoxaparin (LOVENOX) injection  0.5 mg/kg Subcutaneous Q24H  . famotidine  20 mg Oral QHS  . fluticasone  2 spray Each Nare TID  . folic acid  1 mg Oral Daily  . icosapent Ethyl  2 g Oral TID  . insulin aspart  0-20 Units Subcutaneous Q4H  . insulin aspart  15 Units Subcutaneous TID WC  . insulin detemir  40 Units Subcutaneous BID  . Ipratropium-Albuterol  1 puff Inhalation Q6H  . multivitamin with minerals  1 tablet Oral Daily  . pregabalin  50 mg Oral BID  . QUEtiapine  200 mg Oral QHS  . rosuvastatin  20 mg Oral Daily  . sodium chloride flush  3 mL Intravenous Q12H  . Vitamin D (Ergocalciferol)  50,000 Units Oral Q Mon  . zinc sulfate  220 mg Oral Daily   Continuous Infusions: . sodium chloride       LOS: 6 days    Time spent: 26 minutes with over 50% of the time coordinating the patient's care    Harold Hedge, DO [facility] Pager [phone]  If 7PM-7AM, please contact night-coverage [email or web address] Password [facility] 11/04/2019, 6:26 PM

## 2019-11-04 NOTE — Evaluation (Signed)
Physical Therapy Evaluation Patient Details Name: Reginald Duncan MRN: [id number] DOB: Oct 15, 1951 Today's Date: 11/04/2019   History of Present Illness  69 year old Caucasian male, obese, with multiple medical and cardiac problems.  Patient carries diagnosis of COPD, coronary artery disease, OSA, chronic kidney disease stage III, chronic pain, PVD, diabetes mellitus, hypertension, CVA and hyperlipidemia, in addition to documented dementia. Presented to ED 10/29/19 where he was found to be COVID+, febrile and SaO2 86-94%O2 on RA and admitted.   Clinical Impression  PTA pt living alone in a single story apartment with elevator entry. Pt reports that his brother had been getting his groceries but is otherwise unable to assist pt on a regular basis. Pt reports he was ambulating in his home with his cane since his last admission 12/20. Pt is currently severely limited in safe mobility by increased oxygen demand requiring 9.5L O2 via HFNC for ambulation of 12 feet which is complicated by being a mouth breather.  On entry pt was found with HFNC underneath him in the seat and SaO2 72%O2, HFNC replaced on 8L O2. Pt requires maximal verbal cuing for pursed lipped breathing and after 3 minutes only able to recover to 88%O2, supplemental O2 increased to 9.5L and after recovered to 90%O2 ambulated with pt. Pt requires supervision for transfers and min guard for ambulation of 12 feet, before pt with 4/4 DoE, chest pain and SaO2 drop to 76%O2. Pt requires increased cuing for pursed lipped breathing and 5 minute seated rest break before he could ambulate 8 feet back to recliner. SaO2 again dropped to the mid 80s and requires 3 minutes to recover to low 90s. Given pt's current oxygen demand and inability to provide self care with limited outside resources PT recommending SNF level rehab. PT will continue to follow acutely.     Follow Up Recommendations SNF    Equipment Recommendations  None recommended by PT     Recommendations for Other Services OT consult     Precautions / Restrictions Precautions Precautions: None Restrictions Weight Bearing Restrictions: No      Mobility  Bed Mobility               General bed mobility comments: OOB in recliner   Transfers Overall transfer level: Needs assistance Equipment used: None;Rolling walker (2 wheeled) Transfers: Sit to/from Stand Sit to Stand: Supervision         General transfer comment: supervision for initial sit>stand from recliner without UE support, supevision for sit>stand from bed surface with heavy use of bed rail and RW   Ambulation/Gait Ambulation/Gait assistance: Min guard Gait Distance (Feet): 12 Feet(1x12, 1x8 ) Assistive device: Rolling walker (2 wheeled) Gait Pattern/deviations: Step-through pattern;Decreased stride length;Trunk flexed;Wide base of support Gait velocity: slowed Gait velocity interpretation: <1.31 ft/sec, indicative of household ambulator General Gait Details: slow, steady gait around bed, 4/4 DoE with CP and SaO2 drop to 78%O2 requiring 5 min seated rest break to recover before walking back to recliner         Balance Overall balance assessment: Needs assistance Sitting-balance support: No upper extremity supported;Feet supported;Feet unsupported Sitting balance-Leahy Scale: Good     Standing balance support: Single extremity supported;No upper extremity supported;Bilateral upper extremity supported Standing balance-Leahy Scale: Fair Standing balance comment: on intial standin g from recliner able to statically stand before reaching for RW  Pertinent Vitals/Pain Pain Assessment: 0-10 Pain Score: 4  Pain Location: back, knees Pain Descriptors / Indicators: Aching;Sore Pain Intervention(s): Limited activity within patient's tolerance;Monitored during session;Repositioned    Home Living Family/patient expects to be discharged to:: Private  residence Living Arrangements: Alone Available Help at Discharge: Friend(s);Available PRN/intermittently;Family Type of Home: Apartment Home Access: Elevator     Home Layout: One level Home Equipment: Kasandra Knudsen - quad;Walker - 2 wheels;Shower seat Additional Comments: small based quad cane.     Prior Function Level of Independence: Independent with assistive device(s)         Comments: uses cane for household and RW for limited community distances. Using power scooter at stores, drives         Extremity/Trunk Assessment   Upper Extremity Assessment Upper Extremity Assessment: Overall WFL for tasks assessed    Lower Extremity Assessment Lower Extremity Assessment: RLE deficits/detail;LLE deficits/detail RLE Deficits / Details: knees lacking full extension due to pain, strength grossly 3+/5 RLE Sensation: history of peripheral neuropathy;decreased light touch(below calf) LLE Deficits / Details: knees lacking full extension due to pain, strength grossly 3+/5 LLE Sensation: history of peripheral neuropathy;decreased light touch(below calf)       Communication   Communication: No difficulties  Cognition Arousal/Alertness: Awake/alert   Overall Cognitive Status: Within Functional Limits for tasks assessed                                        General Comments General comments (skin integrity, edema, etc.): On entry pt without HFNC in place with SaO2 72%O2 with maximal verbal cuing for pursed lipped breathing for 3 minutes pt able to rebound to 88%O2 increased SaO2 to 9.5L O2 via HFNC and SaO2 rebounded to 91%O2, with ambulation on 9.5L O2 SaO2 dropped to 76%O2 with 4/4 DoE and chest pains, also increased anxiety requiring maximal vc for breathing in through nose to optimize oxygenation, with 5 min of cuing and relaxation SaO2 returned to 89%O2 and pt able to ambulate back to recliner, with drop in SaO2 to 84%O2 pt able to rebound more quickly with less cuing for  pursed lipped breathing        Assessment/Plan    PT Assessment Patient needs continued PT services  PT Problem List Decreased strength;Decreased activity tolerance;Decreased balance;Decreased mobility;Cardiopulmonary status limiting activity       PT Treatment Interventions DME instruction;Gait training;Functional mobility training;Therapeutic activities;Therapeutic exercise;Balance training;Patient/family education    PT Goals (Current goals can be found in the Care Plan section)  Acute Rehab PT Goals Patient Stated Goal: go home PT Goal Formulation: With patient Time For Goal Achievement: 11/18/19 Potential to Achieve Goals: Fair    Frequency Min 2X/week   Barriers to discharge Decreased caregiver support with increased O2 demand pt unable to perform daily care on his own without assist        AM-PAC PT "6 Clicks" Mobility  Outcome Measure Help needed turning from your back to your side while in a flat bed without using bedrails?: None Help needed moving from lying on your back to sitting on the side of a flat bed without using bedrails?: None Help needed moving to and from a bed to a chair (including a wheelchair)?: None Help needed standing up from a chair using your arms (e.g., wheelchair or bedside chair)?: None Help needed to walk in hospital room?: None Help needed climbing 3-5 steps with a railing? :  A Lot 6 Click Score: 22    End of Session Equipment Utilized During Treatment: Oxygen Activity Tolerance: Treatment limited secondary to medical complications (Comment)(oxygen desaturation ) Patient left: in chair;with call bell/phone within reach Nurse Communication: Mobility status PT Visit Diagnosis: Unsteadiness on feet (R26.81);Muscle weakness (generalized) (M62.81);History of falling (Z91.81) Pain - Right/Left: (back)    Time: [id number] PT Time Calculation (min) (ACUTE ONLY): 41 min   Charges:   PT Evaluation $PT Eval Moderate Complexity: 1 Mod PT  Treatments $Gait Training: 23-37 mins        Lanorris Kalisz B. Migdalia Dk PT, DPT Acute Rehabilitation Services Pager [phone] Office [phone]   [place] 11/04/2019, 12:57 PM

## 2019-11-04 NOTE — Progress Notes (Signed)
Patient's spo2 was 80% with oxygen 10 lit. Increase oxygen at 15 lit then spo2 increase upto 92%. Now patient's sp02 is 94 % in 10 lit  Oxygen. Will continue monitor.

## 2019-11-05 DIAGNOSIS — J9601 Acute respiratory failure with hypoxia: Secondary | ICD-10-CM

## 2019-11-05 LAB — COMPREHENSIVE METABOLIC PANEL
ALT: 76 U/L — ABNORMAL HIGH (ref 0–44)
AST: 39 U/L (ref 15–41)
Albumin: 2.5 g/dL — ABNORMAL LOW (ref 3.5–5.0)
Alkaline Phosphatase: 42 U/L (ref 38–126)
Anion gap: 10 (ref 5–15)
BUN: 47 mg/dL — ABNORMAL HIGH (ref 8–23)
CO2: 30 mmol/L (ref 22–32)
Calcium: 9.2 mg/dL (ref 8.9–10.3)
Chloride: 97 mmol/L — ABNORMAL LOW (ref 98–111)
Creatinine, Ser: 1.36 mg/dL — ABNORMAL HIGH (ref 0.61–1.24)
GFR calc Af Amer: >60 mL/min (ref >60)
GFR calc non Af Amer: 53 mL/min — ABNORMAL LOW (ref >60)
Glucose, Bld: 152 mg/dL — ABNORMAL HIGH (ref 70–99)
Potassium: 4.1 mmol/L (ref 3.5–5.1)
Sodium: 137 mmol/L (ref 135–145)
Total Bilirubin: 0.4 mg/dL (ref 0.3–1.2)
Total Protein: 5.4 g/dL — ABNORMAL LOW (ref 6.5–8.1)

## 2019-11-05 LAB — CBC
HCT: 39.9 % (ref 39.0–52.0)
Hemoglobin: 12.9 g/dL — ABNORMAL LOW (ref 13.0–17.0)
MCH: 29.7 pg (ref 26.0–34.0)
MCHC: 32.3 g/dL (ref 30.0–36.0)
MCV: 91.9 fL (ref 80.0–100.0)
Platelets: 150 10*3/uL (ref 150–400)
RBC: 4.34 MIL/uL (ref 4.22–5.81)
RDW: 15.9 % — ABNORMAL HIGH (ref 11.5–15.5)
WBC: 4.9 10*3/uL (ref 4.0–10.5)
nRBC: 0.4 % — ABNORMAL HIGH (ref 0.0–0.2)

## 2019-11-05 LAB — GLUCOSE, CAPILLARY
Glucose-Capillary: 159 mg/dL — ABNORMAL HIGH (ref 70–99)
Glucose-Capillary: 163 mg/dL — ABNORMAL HIGH (ref 70–99)
Glucose-Capillary: 194 mg/dL — ABNORMAL HIGH (ref 70–99)
Glucose-Capillary: 255 mg/dL — ABNORMAL HIGH (ref 70–99)
Glucose-Capillary: 298 mg/dL — ABNORMAL HIGH (ref 70–99)
Glucose-Capillary: 319 mg/dL — ABNORMAL HIGH (ref 70–99)

## 2019-11-05 LAB — MAGNESIUM: Magnesium: 1.9 mg/dL (ref 1.7–2.4)

## 2019-11-05 LAB — TROPONIN I (HIGH SENSITIVITY)
Troponin I (High Sensitivity): 7 ng/L (ref <18)
Troponin I (High Sensitivity): 8 ng/L (ref <18)

## 2019-11-05 MED ORDER — FUROSEMIDE 40 MG PO TABS
40.0000 mg | ORAL_TABLET | Freq: Two times a day (BID) | ORAL | Status: DC
  Administered 2019-11-05 – 2019-11-10 (×12): 40 mg via ORAL
  Filled 2019-11-05 (×12): qty 1

## 2019-11-05 NOTE — Evaluation (Addendum)
Occupational Therapy Evaluation Patient Details Name: Reginald Duncan MRN: [id number] DOB: 11-21-1950 Today's Date: 11/05/2019    History of Present Illness 69 year old Caucasian male, obese, with multiple medical and cardiac problems.  Patient carries diagnosis of COPD, coronary artery disease, OSA, chronic kidney disease stage III, chronic pain, PVD, diabetes mellitus, hypertension, CVA and hyperlipidemia, in addition to documented dementia. Presented to ED 10/29/19 where he was found to be COVID+, febrile and SaO2 86-94%O2 on RA and admitted.    Clinical Impression   This 68 y/o male presents with the above. PTA pt reports mod independence with ADL and functional mobility using SPC. Pt presenting with weakness, decreased activity tolerance and increased DOE with minimal activity. Pt just returned to bed upon arrival to room, agreeable to sitting EOB for OT session. Pt requiring minA for UB ADL and completion of bed mobility. Issued level 2 theraband and educated UB HEP for continued strengthening/endurance. Pt on 5L O2 during session with VSS. Pt tearful during session related to current status, provided emotional support/encouragement throughout. Pt lives alone and with decreased caregiver support at time of discharge. He will benefit from continued acute OT services and recommend follow up therapy services in SNF setting after discharge to progress pt towards his PLOF. Will follow.     Follow Up Recommendations  SNF    Equipment Recommendations  Other (comment)(TBD)           Precautions / Restrictions Precautions Precautions: None Restrictions Weight Bearing Restrictions: No      Mobility Bed Mobility Overal bed mobility: Needs Assistance Bed Mobility: Supine to Sit;Sit to Supine     Supine to sit: Min assist;HOB elevated Sit to supine: Min guard;HOB elevated   General bed mobility comments: assist for trunk elevation   Transfers                      Balance  Overall balance assessment: Needs assistance Sitting-balance support: No upper extremity supported;Feet supported;Feet unsupported Sitting balance-Leahy Scale: Good                                     ADL either performed or assessed with clinical judgement   ADL Overall ADL's : Needs assistance/impaired Eating/Feeding: Modified independent;Sitting   Grooming: Set up;Sitting   Upper Body Bathing: Minimal assistance;Sitting       Upper Body Dressing : Minimal assistance;Sitting                     General ADL Comments: pt just returned to bed after being up in chair majority of the day (RN confirmed), agreeable to sitting EOB during session, engaged in simple ADL and theraband exercise, pt also with questions/concerns related to course of illness                          Pertinent Vitals/Pain Pain Assessment: Faces Faces Pain Scale: No hurt     Hand Dominance     Extremity/Trunk Assessment Upper Extremity Assessment Upper Extremity Assessment: Generalized weakness   Lower Extremity Assessment Lower Extremity Assessment: Defer to PT evaluation       Communication Communication Communication: No difficulties   Cognition Arousal/Alertness: Awake/alert Behavior During Therapy: WFL for tasks assessed/performed(somewhat emotional) Overall Cognitive Status: No family/caregiver present to determine baseline cognitive functioning  General Comments: decreased insight into current deficits noted   General Comments  pt on 5L during session with VSS    Exercises Exercises: Other exercises Other Exercises Other Exercises: issued level 2 theraband, pt performing UB exercise across multiple planes x10 each   Shoulder Instructions      Home Living Family/patient expects to be discharged to:: Private residence Living Arrangements: Alone Available Help at Discharge: Friend(s);Available  PRN/intermittently;Family Type of Home: Apartment Home Access: Elevator     Home Layout: One level     Bathroom Shower/Tub: Teacher, early years/pre: Standard Bathroom Accessibility: Yes   Home Equipment: Latina Craver - 2 wheels;Shower seat   Additional Comments: small based quad cane.       Prior Functioning/Environment Level of Independence: Independent with assistive device(s)        Comments: uses cane for household and RW for limited community distances. Using power scooter at stores, drives         OT Problem List: Decreased strength;Decreased range of motion;Decreased activity tolerance;Obesity;Cardiopulmonary status limiting activity;Decreased knowledge of use of DME or AE;Decreased safety awareness      OT Treatment/Interventions: Self-care/ADL training;Therapeutic exercise;Energy conservation;DME and/or AE instruction;Therapeutic activities;Patient/family education;Balance training    OT Goals(Current goals can be found in the care plan section) Acute Rehab OT Goals Patient Stated Goal: go home OT Goal Formulation: With patient Time For Goal Achievement: 11/19/19 Potential to Achieve Goals: Good ADL Goals Pt Will Perform Grooming: with supervision;sitting;standing Pt Will Perform Lower Body Bathing: with supervision;sitting/lateral leans;sit to/from stand Pt Will Perform Upper Body Dressing: with modified independence;sitting Pt Will Perform Lower Body Dressing: with supervision;sitting/lateral leans;sit to/from stand Pt Will Transfer to Toilet: with supervision;ambulating Pt/caregiver will Perform Home Exercise Program: Increased strength;Both right and left upper extremity;With written HEP provided Additional ADL Goal #1: Pt will demonstrate energy conservation strategies during ADL task with no more than min cueing.  OT Frequency: Min 2X/week   Barriers to D/C:            Co-evaluation              AM-PAC OT "6 Clicks" Daily  Activity     Outcome Measure Help from another person eating meals?: None Help from another person taking care of personal grooming?: A Little Help from another person toileting, which includes using toliet, bedpan, or urinal?: A Lot Help from another person bathing (including washing, rinsing, drying)?: A Lot Help from another person to put on and taking off regular upper body clothing?: A Little Help from another person to put on and taking off regular lower body clothing?: A Lot 6 Click Score: 16   End of Session Equipment Utilized During Treatment: Oxygen Nurse Communication: Mobility status  Activity Tolerance: Patient tolerated treatment well Patient left: in bed;with call bell/phone within reach;with bed alarm set  OT Visit Diagnosis: Muscle weakness (generalized) (M62.81);Other abnormalities of gait and mobility (R26.89)                Time: [id number] OT Time Calculation (min): 22 min Charges:  OT General Charges $OT Visit: 1 Visit OT Evaluation $OT Eval Moderate Complexity: [place], OT [employer] Pager [phone] Office [phone]   Raymondo Band 11/05/2019, 5:26 PM

## 2019-11-05 NOTE — Care Management Important Message (Signed)
Important Message  Patient Details  Name: Reginald Duncan MRN: [id number] Date of Birth: [birth year]   Medicare Important Message Given:  Yes     Orbie Pyo 11/05/2019, 11:34 AM

## 2019-11-05 NOTE — TOC Progression Note (Signed)
Transition of Care [facility]) - Progression Note    Patient Details  Name: Reginald Duncan MRN: [id number] Date of Birth: Nov 11, 1950  [facility] CM/SW [place], LCSW Phone Number: 11/05/2019, 10:44 AM  Clinical Narrative:    CSW received consult for possible SNF placement at time of discharge. CSW spoke with patient regarding PT recommendation of SNF placement at time of discharge. Patient stated that he has been told he can stay in the hospital a few weeks until he feels able to go home. CSW explained that patient can only stay in the hospital if he requires inpatient treatment and the MD will let him know when it is time to transition to rehab. CSW discussed possible locations and patient reported dismay. MD walked in the room at that point and patient requested CSW follow up at a later time. CSW to continue to follow and assist with discharge planning needs.    Expected Discharge Plan: [place] Barriers to Discharge: Continued Medical Work up  Expected Discharge Plan and Services Expected Discharge Plan: [place] arrangements for the past 2 months: Single Family Home Expected Discharge Date: 11/04/19                                     Social Determinants of Health (SDOH) Interventions    Readmission Risk Interventions Readmission Risk Prevention Plan 10/31/2019 09/27/2019  Transportation Screening Complete Complete  PCP or Specialist Appt within 3-5 Days - Complete  HRI or [place] - Complete  Social Work Consult for [place] Planning/Counseling - Complete  Palliative Care Screening - Not Applicable  Medication Review Press photographer) Complete Complete  PCP or Specialist appointment within 3-5 days of discharge Complete -  [place] or Home Care Consult Complete -  Some recent data might be hidden

## 2019-11-05 NOTE — Consult Note (Signed)
   [facility] CM Inpatient Consult   11/05/2019  Reginald Duncan 04/14/1951 [id number]   Patient screened for extreme high risk score for unplanned readmission score of 46%  and for less than 30 days readmission with 5 hospitalizations in [facility] in the past 6 months noted. The patient is in the [place] and had a HX with [place] Management outreach for [facility] calls. On EMMI call patient expressed apartment was being treated for bedbugs in December [[facility] RN CM notes 10/23/2019]  Patient also noted for follow up with [facility].  Patient in the [facility] home palliative program confirmed with Tammy at [place].  Tammy states some difficulty maintaining contact with patient in the community.  Chart review of MD progress notes includes but not limited to from hospital summary reveals 11/03/2019 as follows:  This is a 69 year old male, history of obesity, CAD s/p DES 2014, COPD, OSA, CKD 3a, chronic pain, PVD, diabetes, hypertension, CVA, hyperlipidemia, anxiety who presented with worsening shortness of breath times several weeks and new onset chest pain with associated wheezing and fever found to be Covid positive, febrile and tachycardic in ED as well as hypoxic requiring nasal cannula O2 and started on Decadron and remdesivir.    Patient was offered to be transferred to [facility] however stated he wished to stay at [facility] for further treatment. Does not wish to be transferred to [facility].     Review of patient's medical record reveals patient is currently being recommended for a skilled facility transition.  Primary Care Provider is  Ihor Dow, NP, [facility] and Internal Medicine  Plan:  Follow up with inpatient [facility] team to update on patient and disposition.  If patient goes to SNF, [facility] Care Management does not follow. If returns home [facility] home palliative to follow.  Please place a [facility] Care Management consult as appropriate and for  questions contact:   Natividad Brood, RN BSN [facility] Liaison  [phone] business mobile phone Toll free office [phone]  Fax number: [phone] [email or web address]

## 2019-11-05 NOTE — Progress Notes (Signed)
PROGRESS NOTE    Reginald Duncan  [id number] DOB: 27-Aug-1951 DOA: 10/29/2019 PCP: [name], PA   Brief Narrative:  This is a 69 year old male, history of obesity, CAD s/p DES 2014, COPD, OSA, CKD 3a, chronic pain, PVD, diabetes, hypertension, CVA, hyperlipidemia, anxiety who presented with worsening shortness of breath times several weeks and new onset chest pain with associated wheezing and fever found to be Covid positive, febrile and tachycardic in ED as well as hypoxic requiring nasal cannula O2 and started on Decadron and remdesivir.  CRP noted to be elevated and patient started on Actemra x1 after risk/benefit discussion with patient on 1/7.  Also started on Ativan 1 mg as needed twice daily for severe anxiety.  Patient was offered to be transferred to [facility] however stated he wished to stay at [facility] for further treatment. Does not wish to be transferred to [facility].  1/9: Patient continued to have decline in respiratory status despite Actemra x1 and 4 days remdesivir and Decadron.  Pulmonology consulted.  Continues to have chest pain intermittently and states that he had chest pain all last night.  Does not feel like it is musculoskeletal.  Will check troponins and EKG and oxygen requirement weaning slowly and is down to 9 L.  PT OT recommending skilled nursing facility and patient hesitant to go to skilled nursing facility.  Assessment & Plan:   Active Problems:   Acute on chronic respiratory failure (HCC)  Acute hypoxic respiratory failure secondary to COVID-19 with volume overload -Continues to have symptomatic improvement -CTA Chest: negative for PE, with multifocal pneumonia likely viral or atypical etiology -O2 requirement 12 L/min-> 10 L/min HFNC -Pulmonology consulted 1/9 without much to add -Status post Actemra 1/7 x 1 -Completed 5 days Remdesivir  -Started Dexamethasone and is on Day 8/10 -Trend Inflammatory Markers Daily and have trended down  Recent Labs   11/03/19 0646  DDIMER 0.51*  FERRITIN 317  CRP 3.0*   Lab Results  Component Value Date   SARSCOV2NAA NEGATIVE 10/09/2019   SARSCOV2NAA NEGATIVE 09/25/2019   [place] NEGATIVE 09/15/2019  SpO2: 92 % O2 Flow Rate (L/min): 9 L/min -Diuresed well with IV Lasix but developed AKI which is improved; Will resume Home lasix this AM at 40 mg po BID -Strict I's and O's  -C/w Antitussives with Guaifenesin-Dextromethorphan 10 mL po q4hprn Cough and Chlorpheniramine-Hydrocodone 5 mL po q12hprn -Check Blood Cx x2 and showed NGTD at 4 Days -C/w Zinc 220 mg po Daily and Vitamin C  -Airborne and Contact Precautions -C/w Combivent Scheduled 1 puff IH q6h and Albuterol 2 puff IH q6hprn -Add Flutter Valve and Incentive Spirometry  -Continue to Monitor and Trend Respiratory Status and repeat CXR and Inflammatory Markers in the AM -PT/OT Recommending SNF  Volume Overload -On PO lasix at home -Limited echo with improved EF from prior and is now 60-65% -Good diuresis with IV Lasix, net -9.223 L since admit. Held IV Lasix 2/2 to AKI -Strict I/O and Daily weights;  -Continue to Monitor for S/Sx of Volume Overload   Viral Sepsis secondary to COVID 19 with initial concern for underlying HAP -Previously hypothermic, qSOFA: 2 (RR 31, SBP 90) - high risk, CT chest with multifocal pna, no PE -Sepsis has since resolved -MRSA nares negative -No secondary pneumonia per pulmonology on CT scan, off Zosyn and monitor.  -Check sputum culture if fever -Repeat CXR in AM   Lethargy, likely multifactorial: polypharmacy (seroquel/lyrica/xanax), hypoxia -Improved with med rec  COPD, not in  exacerbation -Continue IV steroids and inhalers -Pulm Consulted: Combivent inhaler with technique/strength assessment at bedside by RT Stanton Kidney recommends outpatient pulmonary follow-up to assure optimize pulmonary treatment and will need official PFTs  Severe anxiety over healthcare -On xanax per outpatient regimen -Continue  decreased seroquel 200 mg QHS   Neuropathy -Continue to decrease Lyrica due to lethargy; Now on 50 mg po BID   Thrombocytopenia, likely reactive -Resolved; his platelet count is now 150,000  Hypoglycemia -Resolved with D5W, now hyperglycemic in setting of steroids -CBGs have been now ranging from 159-319  Uncontrolled Diabetes with Hyperglycemia  -HbA1C 10.0 -Per Diabetic coordinator Recc's: Increase Levemir to 40 units twice daily and continue NovoLog 15 units 3 times daily while on steroids and also on Resistant Novolog SSI q4h  OSA -CPAP on hold due to COVID 19 protocol  AKI on CKD 3a  -Creatinine was 1.55, baseline 1.2-1.3 and now improved. BUN/Cr was 47/1.36 this AM -Continue Holding Lisinopril for now -Resumed Lasix today -Nephrotoxic medications, contrast dyes, hypotension and renally adjust medications-continue monitor and trend renal function  -repeat CMP in a.m.  Chest pain: More likely MSK and anxiety  -Well Score: 0, low risk, PERC: cannot rule out PE, D Dimer negative -CTA without PE, with multifocal pneumonia -Cycle HS Troponin and check EKG  -Does not have reproducible pain on palpation -Will get Cardiology opinion given history of CAD once Troponin and EKG are resulted   Hypertension -Continue Amlodipine 2.5 mg po qHS and Carvedilol 50 mg po BID   CAD s/p DES 2014 -Status post left heart cath 09/01/2019 and showed "Multivessel coronary artery disease with 50-60% mid LAD stenosis and chronic total occlusion of the distal LAD, as well as 70% D2 lesion and sequential 30-40% proximal and 50-60% distal RCA stenoses. Ostial/proximal LAD stent is widely patent. Mildly to moderately left ventricular filling pressure (LVEDP ~24 mmHg)."  -Continue dual antiplatelets with ASA 81 mg po Daily, Clopidogrel 75 mg po Daily, Carvedilol 50 mg po BID and Rosuvastatin 20 mg po daily  -Check HS Troponin and EKG given CP -May Discuss with Cardiology and get formal consultation after  EKG and Troponins are checked   Obesity -Estimated body mass index is 38.25 kg/m as calculated from the following:   Height as of this encounter: 6\' 1"  (1.854 m).   Weight as of this encounter: 131.5 kg. -Weight Loss and Dietary Counseling given   DVT prophylaxis: Enoxaparin 65 mg sq q24h Code Status: FULL CODE  Family Communication: No family present at bedside  Disposition Plan: SNF when medically stable    Consultants:   PCCM/Pulmonary  Discussed with Cardiology Dr. Gwenlyn Found    Procedures:  ECHOCARDIOGRAM IMPRESSIONS    1. Technically difficult study with limited views even after administration of contrast. Left ventricular ejection fraction, by visual estimation, is 60 to 65%. The left ventricle has normal function. There is mildly increased left ventricular wall  thickness.  2. Right ventricle was not well visualized. On limited views appears mildly dilated with grossly normal systolic function  3. The mitral valve is normal in structure. No evidence of mitral valve regurgitation.  4. The tricuspid valve was grossly normal. Tricuspid valve regurgitation is not demonstrated.  5. The aortic valve was not well visualized. Aortic valve regurgitation is not visualized.  6. TR signal is inadequate for assessing pulmonary artery systolic pressure.  FINDINGS  Left Ventricle: Left ventricular ejection fraction, by visual estimation, is 60 to 65%. The left ventricle has normal function. There is mildly  increased left ventricular wall thickness.  Right Ventricle: The right ventricular size is NWV. Right vetricular wall thickness was not assessed. Global RV systolic function is was not well visualized.  Left Atrium: Left atrial size was not well visualized.  Right Atrium: Right atrial size was not well visualized. Right atrial pressure is estimated at 3 mmHg.  Pericardium: Trivial pericardial effusion is present is seen. Trivial pericardial effusion is present.  Mitral  Valve: The mitral valve is normal in structure. No evidence of mitral valve regurgitation.  Tricuspid Valve: The tricuspid valve is grossly normal. Tricuspid valve regurgitation is not demonstrated.  Aortic Valve: The aortic valve was not well visualized. Aortic valve regurgitation is not visualized.  Pulmonic Valve: The pulmonic valve was not well visualized. Pulmonic valve regurgitation is not visualized by color flow Doppler. Pulmonic regurgitation is not visualized by color flow Doppler.  Aorta: The aortic root is normal in size and structure.  Shunts: The interatrial septum was not well visualized.    LEFT VENTRICLE          Normals PLAX 2D LVIDd:         5.20 cm  3.6 cm LVIDs:         3.00 cm  1.7 cm LV PW:         1.20 cm  1.4 cm LV IVS:        1.10 cm  1.3 cm LVOT diam:     2.10 cm  2.0 cm LV SV:         95 ml    79 ml LV SV Index:   34.99    45 ml/m2 LVOT Area:     3.46 cm 3.14 cm2    LEFT ATRIUM         Index LA diam:    3.60 cm 1.41 cm/m    AORTA                 Normals Ao Root diam: 3.70 cm 31 mm    SHUNTS Systemic Diam: 2.10 cm  Antimicrobials:  Anti-infectives (From admission, onward)   Start     Dose/Rate Route Frequency Ordered Stop   11/01/19 1630  piperacillin-tazobactam (ZOSYN) IVPB 3.375 g  Status:  Discontinued     3.375 g 12.5 mL/hr over 240 Minutes Intravenous Every 8 hours 11/01/19 1609 11/03/19 1244   10/31/19 1000  remdesivir 100 mg in sodium chloride 0.9 % 100 mL IVPB  Status:  Discontinued     100 mg 200 mL/hr over 30 Minutes Intravenous Daily 10/30/19 0058 10/30/19 0154   10/31/19 1000  remdesivir 100 mg in sodium chloride 0.9 % 100 mL IVPB     100 mg 200 mL/hr over 30 Minutes Intravenous Every 24 hours 10/30/19 0837 11/03/19 1057   10/30/19 2200  remdesivir 100 mg in sodium chloride 0.9 % 100 mL IVPB  Status:  Discontinued     100 mg 200 mL/hr over 30 Minutes Intravenous Daily 10/29/19 2257 10/30/19 0835   10/30/19 0200   azithromycin (ZITHROMAX) 500 mg in sodium chloride 0.9 % 250 mL IVPB  Status:  Discontinued     500 mg 250 mL/hr over 60 Minutes Intravenous Every 24 hours 10/30/19 0058 10/31/19 1643   10/30/19 0100  remdesivir 200 mg in sodium chloride 0.9% 250 mL IVPB  Status:  Discontinued     200 mg 580 mL/hr over 30 Minutes Intravenous Once 10/30/19 0058 10/30/19 0404   10/29/19 2300  remdesivir 200 mg in sodium chloride  0.9% 250 mL IVPB  Status:  Discontinued     200 mg 580 mL/hr over 30 Minutes Intravenous Once 10/29/19 2257 10/30/19 0835     Subjective: Patient examined at bedside and states that he had a terrible night and states that he had chest pain all night.  No lightheadedness or dizziness but states the chest pain felt like a pressure on his chest last night.  Denies any chest pain currently this morning but states that has been more frequent.  Denies any nausea or vomiting.  No other concerns or complaints at this time.  Objective: Vitals:   11/04/19 1453 11/04/19 2000 11/05/19 0623 11/05/19 0800  BP: (!) 102/50 (!) 103/59  128/71  Pulse: 73 64  64  Resp: 13 18  13   Temp: 97.8 F (36.6 C)     TempSrc: Oral     SpO2: 92%   91%  Weight:   131.5 kg   Height:   6\' 1"  (1.854 m)     Intake/Output Summary (Last 24 hours) at 11/05/2019 0839 Last data filed at 11/04/2019 1500 Gross per 24 hour  Intake 566 ml  Output 750 ml  Net -184 ml   Filed Weights   11/03/19 0500 11/04/19 0651 11/05/19 0623  Weight: 135.4 kg 131 kg 131.5 kg   Examination: Physical Exam:  Constitutional: WN/WD obese Caucasian male in NAD and appears anxious Eyes: Lids and conjunctivae normal, sclerae anicteric  ENMT: External Ears, Nose appear normal. Grossly normal hearing. Mucous membranes are moist Neck: Appears normal, supple, no cervical masses, normal ROM, no appreciable thyromegaly; no JVD Respiratory: Diminished to auscultation bilaterally with coarse breath sounds and mild wheezing; No appreciable  rales, rhonchi or crackles. Normal respiratory effort and patient is not tachypenic. Wearing 9 Liters of Supplemental O2 via [place] Cardiovascular: RRR, no murmurs / rubs / gallops. S1 and S2 auscultated. 1+ LE extremity edema.  Abdomen: Soft, non-tender, Distended 2/2 body habitus.  Bowel sounds positive x4.  GU: Deferred. Musculoskeletal: No clubbing / cyanosis of digits/nails. No joint deformity upper and lower extremities.  Skin: No rashes, lesions, ulcers on a limited skin evaluation. No induration; Warm and dry.  Neurologic: CN 2-12 grossly intact with no focal deficits. Romberg sign and cerebellar reflexes not assessed.  Psychiatric: Normal judgment and insight. Alert and oriented x 3. Anxious mood and appropriate affect.   Data Reviewed: I have personally reviewed following labs and imaging studies  CBC: Recent Labs  Lab 10/30/19 0451 10/31/19 0733 11/01/19 0250 11/02/19 0036 11/03/19 0646 11/04/19 0419 11/05/19 0350  WBC 5.1 4.4 4.6 4.7 5.2 6.3 4.9  NEUTROABS 4.5 3.7 3.9 4.0 4.2  --   --   HGB 12.0* 11.6* 11.4* 12.5* 13.0 13.4 12.9*  HCT 37.3* 34.5* 34.9* 39.2 41.5 41.6 39.9  MCV 93.5 90.8 91.6 92.5 93.5 93.1 91.9  PLT 104* 111* 111* 124* 157 150 [id number]   Basic Metabolic Panel: Recent Labs  Lab 10/30/19 0451 10/30/19 0451 10/31/19 0733 11/01/19 0250 11/01/19 0910 11/02/19 0036 11/03/19 0646 11/04/19 0419 11/05/19 0350  NA  --    < >  --   --  135 138 139 140 137  K  --    < >  --   --  4.2 4.5 4.4 4.5 4.1  CL  --    < >  --   --  98 96* 94* 96* 97*  CO2  --    < >  --   --  25 31 33*  36* 30  GLUCOSE  --    < >  --   --  248* 280* 238* 131* 152*  BUN  --    < >  --   --  43* 50* 48* 51* 47*  CREATININE  --    < >  --   --  1.21 1.50* 1.52* 1.55* 1.36*  CALCIUM  --    < >  --   --  8.5* 8.8* 9.0 9.2 9.2  MG 1.9  --  2.1 1.9  --  2.0 1.9 1.9 1.9  PHOS 4.0  --  3.9 3.4  --  3.3 3.8  --   --    < > = values in this interval not displayed.   GFR: Estimated Creatinine  Clearance: 74.9 mL/min (A) (by C-G formula based on SCr of 1.36 mg/dL (H)). Liver Function Tests: Recent Labs  Lab 10/29/19 1955 10/31/19 1322 11/05/19 0350  AST 34 40 39  ALT 46* 53* 76*  ALKPHOS 47 47 42  BILITOT 1.0 0.5 0.4  PROT 6.4* 5.9* 5.4*  ALBUMIN 2.5* 2.2* 2.5*   No results for input(s): LIPASE, AMYLASE in the last 168 hours. No results for input(s): AMMONIA in the last 168 hours. Coagulation Profile: No results for input(s): INR, PROTIME in the last 168 hours. Cardiac Enzymes: No results for input(s): CKTOTAL, CKMB, CKMBINDEX, TROPONINI in the last 168 hours. BNP (last 3 results) No results for input(s): PROBNP in the last 8760 hours. HbA1C: No results for input(s): HGBA1C in the last 72 hours. CBG: Recent Labs  Lab 11/04/19 1709 11/04/19 2022 11/05/19 0042 11/05/19 0442 11/05/19 0830  GLUCAP 170* 240* 163* 159* 194*   Lipid Profile: No results for input(s): CHOL, HDL, LDLCALC, TRIG, CHOLHDL, LDLDIRECT in the last 72 hours. Thyroid Function Tests: No results for input(s): TSH, T4TOTAL, FREET4, T3FREE, THYROIDAB in the last 72 hours. Anemia Panel: Recent Labs    11/03/19 0646  FERRITIN 317   Sepsis Labs: Recent Labs  Lab 11/01/19 1851 11/02/19 0042  LATICACIDVEN 2.4* 2.1*    Recent Results (from the past 240 hour(s))  Culture, blood (routine x 2)     Status: None (Preliminary result)   Collection Time: 11/01/19  7:00 PM   Specimen: BLOOD RIGHT HAND  Result Value Ref Range Status   Specimen Description BLOOD RIGHT HAND  Final   Special Requests   Final    BOTTLES DRAWN AEROBIC ONLY Blood Culture adequate volume   Culture   Final    NO GROWTH 3 DAYS Performed at [facility], [place] [address], [place], [place] [address]    Report Status PENDING  Incomplete  Culture, blood (routine x 2)     Status: None (Preliminary result)   Collection Time: 11/01/19  7:10 PM   Specimen: BLOOD RIGHT HAND  Result Value Ref Range Status   Specimen  Description BLOOD RIGHT HAND  Final   Special Requests   Final    BOTTLES DRAWN AEROBIC ONLY Blood Culture adequate volume   Culture   Final    NO GROWTH 3 DAYS Performed at [facility], [place] [address] [place], [place] [address]    Report Status PENDING  Incomplete    Radiology Studies: No results found.  Scheduled Meds: . amLODipine  2.5 mg Oral QHS  . vitamin C  500 mg Oral Daily  . aspirin  81 mg Oral QHS  . carvedilol  50 mg Oral BID  . clopidogrel  75 mg Oral Daily  . dexamethasone (DECADRON) injection  6 mg Intravenous Q24H  . enoxaparin (LOVENOX) injection  0.5 mg/kg Subcutaneous Q24H  . famotidine  20 mg Oral QHS  . fluticasone  2 spray Each Nare TID  . folic acid  1 mg Oral Daily  . furosemide  40 mg Oral BID  . icosapent Ethyl  2 g Oral TID  . insulin aspart  0-20 Units Subcutaneous Q4H  . insulin aspart  15 Units Subcutaneous TID WC  . insulin detemir  40 Units Subcutaneous BID  . Ipratropium-Albuterol  1 puff Inhalation Q6H  . multivitamin with minerals  1 tablet Oral Daily  . pregabalin  50 mg Oral BID  . QUEtiapine  200 mg Oral QHS  . rosuvastatin  20 mg Oral Daily  . sodium chloride flush  3 mL Intravenous Q12H  . Vitamin D (Ergocalciferol)  50,000 Units Oral Q Mon  . zinc sulfate  220 mg Oral Daily   Continuous Infusions: . sodium chloride      LOS: 7 days   Kerney Elbe, DO [facility] PAGER is on AMION  If 7PM-7AM, please contact night-coverage [email or web address]

## 2019-11-06 ENCOUNTER — Inpatient Hospital Stay (HOSPITAL_COMMUNITY): Payer: Medicare HMO

## 2019-11-06 LAB — CBC WITH DIFFERENTIAL/PLATELET
Abs Immature Granulocytes: 0.62 10*3/uL — ABNORMAL HIGH (ref 0.00–0.07)
Basophils Absolute: 0 10*3/uL (ref 0.0–0.1)
Basophils Relative: 0 %
Eosinophils Absolute: 0.1 10*3/uL (ref 0.0–0.5)
Eosinophils Relative: 1 %
HCT: 41.6 % (ref 39.0–52.0)
Hemoglobin: 13.2 g/dL (ref 13.0–17.0)
Immature Granulocytes: 11 %
Lymphocytes Relative: 12 %
Lymphs Abs: 0.7 10*3/uL (ref 0.7–4.0)
MCH: 29.4 pg (ref 26.0–34.0)
MCHC: 31.7 g/dL (ref 30.0–36.0)
MCV: 92.7 fL (ref 80.0–100.0)
Monocytes Absolute: 0.2 10*3/uL (ref 0.1–1.0)
Monocytes Relative: 3 %
Neutro Abs: 4.2 10*3/uL (ref 1.7–7.7)
Neutrophils Relative %: 73 %
Platelets: 176 10*3/uL (ref 150–400)
RBC: 4.49 MIL/uL (ref 4.22–5.81)
RDW: 16.1 % — ABNORMAL HIGH (ref 11.5–15.5)
WBC: 5.8 10*3/uL (ref 4.0–10.5)
nRBC: 0.3 % — ABNORMAL HIGH (ref 0.0–0.2)

## 2019-11-06 LAB — COMPREHENSIVE METABOLIC PANEL
ALT: 70 U/L — ABNORMAL HIGH (ref 0–44)
AST: 30 U/L (ref 15–41)
Albumin: 2.7 g/dL — ABNORMAL LOW (ref 3.5–5.0)
Alkaline Phosphatase: 56 U/L (ref 38–126)
Anion gap: 10 (ref 5–15)
BUN: 51 mg/dL — ABNORMAL HIGH (ref 8–23)
CO2: 35 mmol/L — ABNORMAL HIGH (ref 22–32)
Calcium: 9 mg/dL (ref 8.9–10.3)
Chloride: 92 mmol/L — ABNORMAL LOW (ref 98–111)
Creatinine, Ser: 1.54 mg/dL — ABNORMAL HIGH (ref 0.61–1.24)
GFR calc Af Amer: 53 mL/min — ABNORMAL LOW (ref >60)
GFR calc non Af Amer: 46 mL/min — ABNORMAL LOW (ref >60)
Glucose, Bld: 233 mg/dL — ABNORMAL HIGH (ref 70–99)
Potassium: 3.8 mmol/L (ref 3.5–5.1)
Sodium: 137 mmol/L (ref 135–145)
Total Bilirubin: 0.6 mg/dL (ref 0.3–1.2)
Total Protein: 5.4 g/dL — ABNORMAL LOW (ref 6.5–8.1)

## 2019-11-06 LAB — CULTURE, BLOOD (ROUTINE X 2)
Culture: NO GROWTH
Culture: NO GROWTH
Special Requests: ADEQUATE
Special Requests: ADEQUATE

## 2019-11-06 LAB — GLUCOSE, CAPILLARY
Glucose-Capillary: 207 mg/dL — ABNORMAL HIGH (ref 70–99)
Glucose-Capillary: 263 mg/dL — ABNORMAL HIGH (ref 70–99)
Glucose-Capillary: 243 mg/dL — ABNORMAL HIGH (ref 70–99)
Glucose-Capillary: 293 mg/dL — ABNORMAL HIGH (ref 70–99)
Glucose-Capillary: 259 mg/dL — ABNORMAL HIGH (ref 70–99)
Glucose-Capillary: 286 mg/dL — ABNORMAL HIGH (ref 70–99)

## 2019-11-06 LAB — LACTATE DEHYDROGENASE: LDH: 372 U/L — ABNORMAL HIGH (ref 98–192)

## 2019-11-06 LAB — FERRITIN: Ferritin: 171 ng/mL (ref 24–336)

## 2019-11-06 LAB — D-DIMER, QUANTITATIVE: D-Dimer, Quant: 0.48 ug/mL-FEU (ref 0.00–0.50)

## 2019-11-06 LAB — PHOSPHORUS: Phosphorus: 3.3 mg/dL (ref 2.5–4.6)

## 2019-11-06 LAB — C-REACTIVE PROTEIN: CRP: 1.4 mg/dL — ABNORMAL HIGH (ref <1.0)

## 2019-11-06 LAB — MAGNESIUM: Magnesium: 1.8 mg/dL (ref 1.7–2.4)

## 2019-11-06 MED ORDER — ICOSAPENT ETHYL 1 G PO CAPS
2.0000 g | ORAL_CAPSULE | Freq: Two times a day (BID) | ORAL | Status: DC
  Administered 2019-11-07 – 2019-11-10 (×8): 2 g via ORAL
  Filled 2019-11-06 (×10): qty 2

## 2019-11-06 MED ORDER — INSULIN DETEMIR 100 UNIT/ML ~~LOC~~ SOLN
50.0000 [IU] | Freq: Two times a day (BID) | SUBCUTANEOUS | Status: DC
  Administered 2019-11-06 – 2019-11-08 (×5): 50 [IU] via SUBCUTANEOUS
  Filled 2019-11-06 (×7): qty 0.5

## 2019-11-06 MED ORDER — OXYMETAZOLINE HCL 0.05 % NA SOLN
1.0000 | Freq: Two times a day (BID) | NASAL | Status: DC
  Administered 2019-11-06 – 2019-11-10 (×5): 1 via NASAL
  Filled 2019-11-06 (×2): qty 30

## 2019-11-06 MED ORDER — INSULIN ASPART 100 UNIT/ML ~~LOC~~ SOLN
20.0000 [IU] | Freq: Three times a day (TID) | SUBCUTANEOUS | Status: DC
  Administered 2019-11-06 – 2019-11-09 (×9): 20 [IU] via SUBCUTANEOUS

## 2019-11-06 NOTE — Progress Notes (Signed)
Inpatient Diabetes Program Recommendations  AACE/ADA: New Consensus Statement on Inpatient Glycemic Control   Target Ranges:  Prepandial:   less than 140 mg/dL      Peak postprandial:   less than 180 mg/dL (1-2 hours)      Critically ill patients:  140 - 180 mg/dL   Results for YHAIR, POURCIAU (MRN [id number]) as of 11/06/2019 10:07  Ref. Range 11/05/2019 08:30 11/05/2019 12:06 11/05/2019 17:24 11/05/2019 20:45 11/06/2019 01:00 11/06/2019 04:21 11/06/2019 08:10  Glucose-Capillary Latest Ref Range: 70 - 99 mg/dL 194 (H) 319 (H) 298 (H) 255 (H) 207 (H) 263 (H) 243 (H)   Review of Glycemic Control  Diabetes history:DM2 Outpatient Diabetes medications:Humulin R U50030units BID(uses aU100 insulin syringe, dose would be 150units of U500) Current orders for Inpatient glycemic control:Levemir 40 units BID, Novolog 15 units TID with meals, Novolog 0-20 units Q4H; Decadron 6 mg Q24H  Inpatient Diabetes Program Recommendations:  Insulin - Basal: If steroids are continued, please consider increasing Levemir to 50 units BID.  Insulin-Meal Coverage: If steroids are continued, please consider increasing meal coverage to Novolog 20 units TID with meals if patient eats at least 50% of meals.  Thanks, Barnie Alderman, RN, MSN, CDE Diabetes Coordinator Inpatient Diabetes Program [phone] (Team Pager from 8am to 5pm)

## 2019-11-06 NOTE — NC FL2 (Signed)
[place] LEVEL OF CARE SCREENING TOOL     IDENTIFICATION  Patient Name: Reginald Duncan Birthdate: [birth year] Sex: male Admission Date (Current Location): 10/29/2019  [facility] and [place] Number:  [employer] and Address:  [facility], [place] [address] [place],  [address]      Provider Number: [id number]  Attending Physician Name and Address:  Kerney Elbe, DO  Relative Name and Phone Number:  [name], brother [phone]    Current Level of Care: Hospital Recommended Level of Care: [place] Prior Approval Number:    Date Approved/Denied:   PASRR Number: [id number]  Discharge Plan: SNF    Current Diagnoses: Patient Active Problem List   Diagnosis Date Noted  . Acute on chronic respiratory failure ([place]) 10/29/2019  . Heart failure with preserved ejection fraction ([place])   . Palliative care encounter   . DKA (diabetic ketoacidoses) ([place]) 10/09/2019  . Anemia, chronic renal failure 10/09/2019  . COPD exacerbation ([place]) 09/24/2019  . Hypokalemia 09/13/2019  . Bilateral lower extremity edema 09/13/2019  . ACS (acute coronary syndrome) ([place]) 08/31/2019  . Severe obesity (BMI 35.0-39.9) with comorbidity ([place]) 08/31/2019  . Hernia of abdominal wall 08/31/2019  . Gross hematuria 08/31/2019  . Cellulitis and abscess of right lower extremity 08/31/2019  . Wide-complex tachycardia ([place]) 08/13/2018  . Acute exacerbation of chronic obstructive pulmonary disease (COPD) ([place])   . Chronic back pain   . Near syncope 08/12/2018  . AKI (acute kidney injury) ([place]) 07/06/2018  . Angina pectoris ([place]) 04/08/2014  . Depression 01/13/2013  . CKD (chronic kidney disease), stage III ([place])   . Intermediate coronary syndrome - [place] angina 12/25/2012  . CHEST PAIN UNSPECIFIED 10/20/2010  . Coronary atherosclerosis 08/04/2010  . FOREIGN BODY, SOFT TISSUE 09/03/2007  . S/P PTCA (percutaneous transluminal coronary  angioplasty) 08/19/2007  . UPPER RESPIRATORY INFECTION, ACUTE, WITH BRONCHITIS 08/13/2007  . [place], [place] NEC 08/06/2007  . DEPRESSIVE DISORDER 08/01/2007  . EDEMA- LOCALIZED 08/01/2007  . HYPERTENSION, BENIGN ESSENTIAL 07/16/2007  . LEG, LOWER, PAIN 07/16/2007  . Diabetes ([place]) 12/20/2006  . HYPERCHOLESTEROLEMIA 12/20/2006  . HYPERTRIGLYCERIDEMIA 12/20/2006  . Hypertensive heart disease 12/20/2006  . Obesity with sleep apnea 12/20/2006  . PROTEINURIA 12/20/2006  . Type 2 diabetes mellitus with complication, with long-term current use of insulin ([place]) 12/20/2006    Orientation RESPIRATION BLADDER Height & Weight     Self, Time, Situation, Place  O2(Nasal Cannula 1B) Continent Weight: 288 lb 5.8 oz (130.8 kg) Height:  6\' 1"  (185.4 cm)  BEHAVIORAL SYMPTOMS/MOOD NEUROLOGICAL BOWEL NUTRITION STATUS      Continent Diet(See DC summary)  AMBULATORY STATUS COMMUNICATION OF NEEDS Skin   Extensive Assist Verbally Normal                       Personal Care Assistance Level of Assistance  Bathing, Feeding, Dressing Bathing Assistance: Limited assistance Feeding assistance: Independent Dressing Assistance: Limited assistance     Functional Limitations Info  Sight, Speech, Hearing Sight Info: Adequate Hearing Info: Adequate Speech Info: Adequate    SPECIAL CARE FACTORS FREQUENCY  PT (By licensed PT), OT (By licensed OT)     PT Frequency: 5x per week OT Frequency: 5x per week            Contractures Contractures Info: Not present    Additional Factors Info  Code Status, Allergies, Isolation Precautions, Psychotropic, Insulin Sliding Scale Code Status Info: Full Allergies Info: Mold Extract (  Trichophyton) Psychotropic Info: Seroquel Insulin Sliding Scale Info: See DC summary for dose Isolation Precautions Info: Covid+     Current Medications (11/06/2019):  This is the current hospital active medication list Current Facility-Administered Medications   Medication Dose Route Frequency Provider Last Rate Last Admin  . 0.9 %  sodium chloride infusion  250 mL Intravenous PRN Dana Allan I, MD      . albuterol (VENTOLIN HFA) 108 (90 Base) MCG/ACT inhaler 2 puff  2 puff Inhalation Q6H PRN Bonnell Public, MD   2 puff at 11/06/19 0904  . ALPRAZolam Duanne Moron) tablet 0.5 mg  0.5 mg Oral BID PRN Harold Hedge, MD   0.5 mg at 11/05/19 2112  . amLODipine (NORVASC) tablet 2.5 mg  2.5 mg Oral QHS Dana Allan I, MD   2.5 mg at 11/05/19 2110  . ascorbic acid (VITAMIN C) tablet 500 mg  500 mg Oral Daily Dana Allan I, MD   500 mg at 11/06/19 0901  . aspirin chewable tablet 81 mg  81 mg Oral QHS Dana Allan I, MD   81 mg at 11/05/19 2110  . carvedilol (COREG) tablet 50 mg  50 mg Oral BID Dana Allan I, MD   50 mg at 11/06/19 0902  . clopidogrel (PLAVIX) tablet 75 mg  75 mg Oral Daily Dana Allan I, MD   75 mg at 11/06/19 0903  . dexamethasone (DECADRON) injection 6 mg  6 mg Intravenous Q24H Dana Allan I, MD   6 mg at 11/06/19 0102  . enoxaparin (LOVENOX) injection 65 mg  0.5 mg/kg Subcutaneous Q24H Alvira Philips, [place]   65 mg at 11/06/19 0905  . famotidine (PEPCID) tablet 20 mg  20 mg Oral QHS Dana Allan I, MD   20 mg at 11/05/19 2112  . fluticasone (FLONASE) 50 MCG/ACT nasal spray 2 spray  2 spray Each Nare TID Bonnell Public, MD   2 spray at 11/06/19 0917  . folic acid (FOLVITE) tablet 1 mg  1 mg Oral Daily Alvira Philips, [place]   1 mg at 11/06/19 0901  . furosemide (LASIX) tablet 40 mg  40 mg Oral BID Raiford Noble [place], DO   40 mg at 11/06/19 [id number]  . guaiFENesin-dextromethorphan (ROBITUSSIN DM) 100-10 MG/5ML syrup 5 mL  5 mL Oral Q4H PRN Harold Hedge, MD   5 mL at 11/06/19 0921  . HYDROcodone-acetaminophen (NORCO) 7.5-325 MG per tablet 1 tablet  1 tablet Oral Q4H PRN Bonnell Public, MD   1 tablet at 11/06/19 0430  . icosapent Ethyl (VASCEPA) 1 g capsule 2 g  2 g Oral TID Dana Allan  I, MD   2 g at 11/06/19 0915  . insulin aspart (novoLOG) injection 0-20 Units  0-20 Units Subcutaneous Q4H Harold Hedge, MD   11 Units at 11/06/19 1209  . insulin aspart (novoLOG) injection 20 Units  20 Units Subcutaneous TID WC Raiford Noble [place], DO   20 Units at 11/06/19 1209  . insulin detemir (LEVEMIR) injection 50 Units  50 Units Subcutaneous BID Sheikh, Georgina Quint [place], DO      . Ipratropium-Albuterol (COMBIVENT) respimat 1 puff  1 puff Inhalation Q6H Harold Hedge, MD   1 puff at 11/06/19 0908  . multivitamin with minerals tablet 1 tablet  1 tablet Oral Daily Dana Allan I, MD   1 tablet at 11/06/19 0901  . pregabalin (LYRICA) capsule 50 mg  50 mg Oral BID Harold Hedge, MD   50 mg at  11/06/19 0903  . QUEtiapine (SEROQUEL) tablet 200 mg  200 mg Oral QHS Harold Hedge, MD   200 mg at 11/05/19 2110  . rosuvastatin (CRESTOR) tablet 20 mg  20 mg Oral Daily Dana Allan I, MD   20 mg at 11/06/19 0904  . sodium chloride flush (NS) 0.9 % injection 3 mL  3 mL Intravenous Q12H Dana Allan I, MD   3 mL at 11/06/19 1210  . sodium chloride flush (NS) 0.9 % injection 3 mL  3 mL Intravenous PRN Bonnell Public, MD      . Vitamin D (Ergocalciferol) (DRISDOL) capsule 50,000 Units  50,000 Units Oral Q [place] I, MD   50,000 Units at 11/03/19 [phone]  . zinc sulfate capsule 220 mg  220 mg Oral Daily Dana Allan I, MD   220 mg at 11/06/19 [id number]     Discharge Medications: Please see discharge summary for a list of discharge medications.  Relevant Imaging Results:  Relevant Lab Results:   Additional Information SSN [id number]  [place], LCSW

## 2019-11-06 NOTE — Progress Notes (Signed)
Patient was attempting to eat. Patient called out c/o of SOB.RN responded to call and found patient without oxygen and having an active nose bleed with small amount of blood on multiple tissues. Patient stat where 81% , place Oxygen back on and patient sats would not respond with oxygen at 2L RN increases oxygen to 6L and sats are currently at 91-92%.Updated MD with sindings

## 2019-11-06 NOTE — Progress Notes (Signed)
PROGRESS NOTE    Reginald Duncan  [id number] DOB: 07-16-1951 DOA: 10/29/2019 PCP: [name], PA   Brief Narrative:  This is a 69 year old male, history of obesity, CAD s/p DES 2014, COPD, OSA, CKD 3a, chronic pain, PVD, diabetes, hypertension, CVA, hyperlipidemia, anxiety who presented with worsening shortness of breath times several weeks and new onset chest pain with associated wheezing and fever found to be Covid positive, febrile and tachycardic in ED as well as hypoxic requiring nasal cannula O2 and started on Decadron and remdesivir.  CRP noted to be elevated and patient started on Actemra x1 after risk/benefit discussion with patient on 1/7.  Also started on Ativan 1 mg as needed twice daily for severe anxiety.  Patient was offered to be transferred to [facility] however stated he wished to stay at [facility] for further treatment. Does not wish to be transferred to [facility].  1/9: Patient continued to have decline in respiratory status despite Actemra x1 and 4 days remdesivir and Decadron.  Pulmonology consulted.  Continues to have chest pain intermittently and states that he had chest pain the night before last but it is improved now. Troponin was flat  Does not feel like it is musculoskeletal.  Will check troponins and EKG and oxygen requirement weaning slowly and was down to 3 L but had an acute nose bleed and had to be placed on 6 Liters.  PT OT recommending skilled nursing facility and patient hesitant to go to skilled nursing facility.  Assessment & Plan:   Active Problems:   Acute on chronic respiratory failure (HCC)  Acute hypoxic respiratory failure secondary to COVID-19 with volume overload -Continues to have symptomatic improvement -CTA Chest: negative for PE, with multifocal pneumonia likely viral or atypical etiology -O2 requirement 12 L/min-> 10 L/min HFNC -> 3 Liters and now 6 Liters after Nose Bleed -Repeat CXR on 11/06/2019 showed: "Persistent bilateral airspace lung  opacities consistent with multifocal COVID-19 pneumonia. No new lung abnormalities  -Pulmonology consulted 1/9 without much to add -Status post Actemra 1/7 x 1 -Completed 5 days Remdesivir  -Started Dexamethasone and is on Day 8/10 -Trend Inflammatory Markers Daily and have trended down  Recent Labs    11/06/19 0224  DDIMER 0.48  FERRITIN 171  LDH 372*  CRP 1.4*   Lab Results  Component Value Date   SARSCOV2NAA NEGATIVE 10/09/2019   SARSCOV2NAA NEGATIVE 09/25/2019   [place] NEGATIVE 09/15/2019  SpO2: 94 % O2 Flow Rate (L/min): 3 L/min -Diuresed well with IV Lasix but developed AKI which is improved; Resumed Home lasix yesterday AM at 40 mg po BID and Renal Fxn slightly elevated again  -Strict I's and O's  -C/w Antitussives with Guaifenesin-Dextromethorphan 10 mL po q4hprn Cough and Chlorpheniramine-Hydrocodone 5 mL po q12hprn -Check Blood Cx x2 and showed NGTD at 5 Days -C/w Zinc 220 mg po Daily and Vitamin C  -Airborne and Contact Precautions -C/w Combivent Scheduled 1 puff IH q6h and Albuterol 2 puff IH q6hprn -Add Flutter Valve and Incentive Spirometry; Added Afrin Spray because of Nose bleed and Humidified Air  -Continue to Monitor and Trend Respiratory Status and repeat CXR and Inflammatory Markers in the AM -PT/OT Recommending SNF  Volume Overload -On PO lasix at home -Limited echo with improved EF from prior and is now 60-65% -Good diuresis with IV Lasix, net -11.179 L since admit. Held IV Lasix 2/2 to AKI but resumed po Lasix and Renal Fxn again worsened  -Strict I/O and Daily weights;  -Continue  to Monitor for S/Sx of Volume Overload   Viral Sepsis secondary to COVID 19 with initial concern for underlying HAP -Previously hypothermic, qSOFA: 2 (RR 31, SBP 90) - high risk, CT chest with multifocal pna, no PE -Sepsis has since resolved -MRSA nares negative -No secondary pneumonia per pulmonology on CT scan, off Zosyn and monitor.  -Check sputum culture if fever  -Repeat CXR as above   Lethargy, likely multifactorial: polypharmacy (seroquel/lyrica/xanax), hypoxia -Improved with med rec  COPD, not in exacerbation -Continue IV steroids and inhalers -Pulm Consulted: Combivent inhaler with technique/strength assessment at bedside by RT Stanton Kidney recommends outpatient pulmonary follow-up to assure optimize pulmonary treatment and will need official PFTs  Severe anxiety over healthcare -On xanax per outpatient regimen -Continue decreased seroquel 200 mg QHS   Neuropathy -Continue to decrease Lyrica due to lethargy; Now on 50 mg po BID   Thrombocytopenia, likely reactive -Resolved; his platelet count is now 150,000  Hypoglycemia -Resolved with D5W, now hyperglycemic in setting of steroids -CBGs have been now ranging from 159-319  Uncontrolled Diabetes with Hyperglycemia  -HbA1C 10.0 -Per Diabetic coordinator Recc's: Increase Levemir to 40 units twice daily and continue NovoLog 15 units 3 times daily while on steroids and also on Resistant Novolog SSI q4h  OSA -CPAP on hold due to COVID 19 protocol  AKI on CKD 3a  -Creatinine was 1.55, baseline 1.2-1.3 and had improved but worsened again. BUN/Cr was 47/1.36 yesterday and repeat was 51/1.54 -Continue Holding Lisinopril for now -Resumed Lasix yesterday and may just go to once a day if Renal Fxn continues to worsen -Nephrotoxic medications, contrast dyes, hypotension and renally adjust medications-continue monitor and trend renal function  -repeat CMP in a.m.  Chest pain: More likely MSK and anxiety, improved  -Well Score: 0, low risk, PERC: cannot rule out PE, D Dimer negative -CTA without PE, with multifocal pneumonia -Cycle HS Troponin and check EKG; Troponin Negative and EKG never repeated when ordered  -Does not have reproducible pain on palpation -Will get Cardiology opinion given history of CAD once Troponin and EKG are resulted   Hypertension -Continue Amlodipine 2.5 mg po qHS and  Carvedilol 50 mg po BID   CAD s/p DES 2014 -Status post left heart cath 09/01/2019 and showed "Multivessel coronary artery disease with 50-60% mid LAD stenosis and chronic total occlusion of the distal LAD, as well as 70% D2 lesion and sequential 30-40% proximal and 50-60% distal RCA stenoses. Ostial/proximal LAD stent is widely patent. Mildly to moderately left ventricular filling pressure (LVEDP ~24 mmHg)."  -Continue dual antiplatelets with ASA 81 mg po Daily, Clopidogrel 75 mg po Daily, Carvedilol 50 mg po BID and Rosuvastatin 20 mg po daily  -Check HS Troponin and EKG given CP -May Discuss with Cardiology and get formal consultation after EKG and Troponins are checked; Troponin Negative but EKG never done. So will order again   Obesity -Estimated body mass index is 38.04 kg/m as calculated from the following:   Height as of this encounter: 6\' 1"  (1.854 m).   Weight as of this encounter: 130.8 kg. -Weight Loss and Dietary Counseling given   DVT prophylaxis: Enoxaparin 65 mg sq q24h Code Status: FULL CODE  Family Communication: No family present at bedside  Disposition Plan: SNF when medically stable    Consultants:   PCCM/Pulmonary  Discussed with Cardiology Dr. Gwenlyn Found    Procedures:  ECHOCARDIOGRAM IMPRESSIONS    1. Technically difficult study with limited views even after administration of contrast. Left ventricular ejection  fraction, by visual estimation, is 60 to 65%. The left ventricle has normal function. There is mildly increased left ventricular wall  thickness.  2. Right ventricle was not well visualized. On limited views appears mildly dilated with grossly normal systolic function  3. The mitral valve is normal in structure. No evidence of mitral valve regurgitation.  4. The tricuspid valve was grossly normal. Tricuspid valve regurgitation is not demonstrated.  5. The aortic valve was not well visualized. Aortic valve regurgitation is not visualized.  6. TR signal  is inadequate for assessing pulmonary artery systolic pressure.  FINDINGS  Left Ventricle: Left ventricular ejection fraction, by visual estimation, is 60 to 65%. The left ventricle has normal function. There is mildly increased left ventricular wall thickness.  Right Ventricle: The right ventricular size is NWV. Right vetricular wall thickness was not assessed. Global RV systolic function is was not well visualized.  Left Atrium: Left atrial size was not well visualized.  Right Atrium: Right atrial size was not well visualized. Right atrial pressure is estimated at 3 mmHg.  Pericardium: Trivial pericardial effusion is present is seen. Trivial pericardial effusion is present.  Mitral Valve: The mitral valve is normal in structure. No evidence of mitral valve regurgitation.  Tricuspid Valve: The tricuspid valve is grossly normal. Tricuspid valve regurgitation is not demonstrated.  Aortic Valve: The aortic valve was not well visualized. Aortic valve regurgitation is not visualized.  Pulmonic Valve: The pulmonic valve was not well visualized. Pulmonic valve regurgitation is not visualized by color flow Doppler. Pulmonic regurgitation is not visualized by color flow Doppler.  Aorta: The aortic root is normal in size and structure.  Shunts: The interatrial septum was not well visualized.    LEFT VENTRICLE          Normals PLAX 2D LVIDd:         5.20 cm  3.6 cm LVIDs:         3.00 cm  1.7 cm LV PW:         1.20 cm  1.4 cm LV IVS:        1.10 cm  1.3 cm LVOT diam:     2.10 cm  2.0 cm LV SV:         95 ml    79 ml LV SV Index:   34.99    45 ml/m2 LVOT Area:     3.46 cm 3.14 cm2    LEFT ATRIUM         Index LA diam:    3.60 cm 1.41 cm/m    AORTA                 Normals Ao Root diam: 3.70 cm 31 mm    SHUNTS Systemic Diam: 2.10 cm  Antimicrobials:  Anti-infectives (From admission, onward)   Start     Dose/Rate Route Frequency Ordered Stop   11/01/19 1630   piperacillin-tazobactam (ZOSYN) IVPB 3.375 g  Status:  Discontinued     3.375 g 12.5 mL/hr over 240 Minutes Intravenous Every 8 hours 11/01/19 1609 11/03/19 1244   10/31/19 1000  remdesivir 100 mg in sodium chloride 0.9 % 100 mL IVPB  Status:  Discontinued     100 mg 200 mL/hr over 30 Minutes Intravenous Daily 10/30/19 0058 10/30/19 0154   10/31/19 1000  remdesivir 100 mg in sodium chloride 0.9 % 100 mL IVPB     100 mg 200 mL/hr over 30 Minutes Intravenous Every 24 hours 10/30/19 0837 11/03/19 1057  10/30/19 2200  remdesivir 100 mg in sodium chloride 0.9 % 100 mL IVPB  Status:  Discontinued     100 mg 200 mL/hr over 30 Minutes Intravenous Daily 10/29/19 2257 10/30/19 0835   10/30/19 0200  azithromycin (ZITHROMAX) 500 mg in sodium chloride 0.9 % 250 mL IVPB  Status:  Discontinued     500 mg 250 mL/hr over 60 Minutes Intravenous Every 24 hours 10/30/19 0058 10/31/19 1643   10/30/19 0100  remdesivir 200 mg in sodium chloride 0.9% 250 mL IVPB  Status:  Discontinued     200 mg 580 mL/hr over 30 Minutes Intravenous Once 10/30/19 0058 10/30/19 0404   10/29/19 2300  remdesivir 200 mg in sodium chloride 0.9% 250 mL IVPB  Status:  Discontinued     200 mg 580 mL/hr over 30 Minutes Intravenous Once 10/29/19 2257 10/30/19 0835     Subjective: Patient seen and examined at bedside and is in his chest pain is a bit better.  Wishes that he slept a little bit better.  No nausea or vomiting.  Happy that his oxygen requirements are being weaned and is on 3 L.  Later on in the evening he had a nosebleed.  No other concerns or plans at this time.  Objective: Vitals:   11/06/19 0445 11/06/19 0900 11/06/19 0902 11/06/19 1334  BP:   111/73 (!) 118/98  Pulse: 81  77 79  Resp: 16  (!) 21 20  Temp:  97.7 F (36.5 C)  98 F (36.7 C)  TempSrc:  Oral  Oral  SpO2: 93%  94%   Weight:      Height:        Intake/Output Summary (Last 24 hours) at 11/06/2019 1824 Last data filed at 11/06/2019 1334 Gross per 24  hour  Intake 1469 ml  Output 3425 ml  Net -1956 ml   Filed Weights   11/04/19 0651 11/05/19 0623 11/06/19 0417  Weight: 131 kg 131.5 kg 130.8 kg   Examination: Physical Exam:  Constitutional: WN/WD obese Caucasian male in NAD and appears calm Eyes: Lids and conjunctivae normal, sclerae anicteric  ENMT: External Ears, Nose appear normal. Grossly normal hearing. Neck: Appears normal, supple, no cervical masses, normal ROM, no appreciable thyromegaly Respiratory: Diminished to auscultation bilaterally with coarse breath sounds; no appreciable wheezing, rales, or crackles. Normal respiratory effort and patient is not tachypenic. No accessory muscle use. Wearing 3 Liters of supplemental O2 via Normal Cardiovascular: RRR, no murmurs / rubs / gallops. S1 and S2 auscultated. Trace to 1+ LE Edema Abdomen: Soft, Slightly tender to palpate, Distended. Has a few Abdominal hernias and an umbilical hernia. Bowel sounds positive.  GU: Deferred. Musculoskeletal: No clubbing / cyanosis of digits/nails. No joint deformity upper and lower extremities.  Skin: No rashes, lesions, ulcers on a limited skin evaluation. No induration; Warm and dry.  Neurologic: CN 2-12 grossly intact with no focal deficits. Romberg sign and cerebellar reflexes not assessed.  Psychiatric: Normal judgment and insight. Alert and oriented x 3. Pleasant mood and appropriate affect. Not as anxious today.   Data Reviewed: I have personally reviewed following labs and imaging studies  CBC: Recent Labs  Lab 10/31/19 0733 10/31/19 0733 11/01/19 0250 11/01/19 0250 11/02/19 0036 11/03/19 0646 11/04/19 0419 11/05/19 0350 11/06/19 0224  WBC 4.4   < > 4.6   < > 4.7 5.2 6.3 4.9 5.8  NEUTROABS 3.7  --  3.9  --  4.0 4.2  --   --  4.2  HGB 11.6*   < >  11.4*   < > 12.5* 13.0 13.4 12.9* 13.2  HCT 34.5*   < > 34.9*   < > 39.2 41.5 41.6 39.9 41.6  MCV 90.8   < > 91.6   < > 92.5 93.5 93.1 91.9 92.7  PLT 111*   < > 111*   < > 124* 157 150 150  176   < > = values in this interval not displayed.   Basic Metabolic Panel: Recent Labs  Lab 10/31/19 0733 10/31/19 1322 11/01/19 0250 11/01/19 0910 11/02/19 0036 11/03/19 [id number] 11/04/19 0419 11/05/19 0350 11/06/19 0224  NA  --    < >  --    < > 138 139 140 137 137  K  --    < >  --    < > 4.5 4.4 4.5 4.1 3.8  CL  --    < >  --    < > 96* 94* 96* 97* 92*  CO2  --    < >  --    < > 31 33* 36* 30 35*  GLUCOSE  --    < >  --    < > 280* 238* 131* 152* 233*  BUN  --    < >  --    < > 50* 48* 51* 47* 51*  CREATININE  --    < >  --    < > 1.50* 1.52* 1.55* 1.36* 1.54*  CALCIUM  --    < >  --    < > 8.8* 9.0 9.2 9.2 9.0  MG 2.1  --  1.9  --  2.0 1.9 1.9 1.9 1.8  PHOS 3.9  --  3.4  --  3.3 3.8  --   --  3.3   < > = values in this interval not displayed.   GFR: Estimated Creatinine Clearance: 66 mL/min (A) (by C-G formula based on SCr of 1.54 mg/dL (H)). Liver Function Tests: Recent Labs  Lab 10/31/19 1322 11/05/19 0350 11/06/19 0224  AST 40 39 30  ALT 53* 76* 70*  ALKPHOS 47 42 56  BILITOT 0.5 0.4 0.6  PROT 5.9* 5.4* 5.4*  ALBUMIN 2.2* 2.5* 2.7*   No results for input(s): LIPASE, AMYLASE in the last 168 hours. No results for input(s): AMMONIA in the last 168 hours. Coagulation Profile: No results for input(s): INR, PROTIME in the last 168 hours. Cardiac Enzymes: No results for input(s): CKTOTAL, CKMB, CKMBINDEX, TROPONINI in the last 168 hours. BNP (last 3 results) No results for input(s): PROBNP in the last 8760 hours. HbA1C: No results for input(s): HGBA1C in the last 72 hours. CBG: Recent Labs  Lab 11/06/19 0100 11/06/19 0421 11/06/19 0810 11/06/19 1157 11/06/19 1642  GLUCAP 207* 263* 243* 293* 259*   Lipid Profile: No results for input(s): CHOL, HDL, LDLCALC, TRIG, CHOLHDL, LDLDIRECT in the last 72 hours. Thyroid Function Tests: No results for input(s): TSH, T4TOTAL, FREET4, T3FREE, THYROIDAB in the last 72 hours. Anemia Panel: Recent Labs    11/06/19  0224  FERRITIN 171   Sepsis Labs: Recent Labs  Lab 11/01/19 1851 11/02/19 0042  LATICACIDVEN 2.4* 2.1*    Recent Results (from the past 240 hour(s))  Culture, blood (routine x 2)     Status: None   Collection Time: 11/01/19  7:00 PM   Specimen: BLOOD RIGHT HAND  Result Value Ref Range Status   Specimen Description BLOOD RIGHT HAND  Final   Special Requests   Final    BOTTLES  DRAWN AEROBIC ONLY Blood Culture adequate volume   Culture   Final    NO GROWTH 5 DAYS Performed at [facility], [place] [address], [place], [place] [address]    Report Status 11/06/2019 FINAL  Final  Culture, blood (routine x 2)     Status: None   Collection Time: 11/01/19  7:10 PM   Specimen: BLOOD RIGHT HAND  Result Value Ref Range Status   Specimen Description BLOOD RIGHT HAND  Final   Special Requests   Final    BOTTLES DRAWN AEROBIC ONLY Blood Culture adequate volume   Culture   Final    NO GROWTH 5 DAYS Performed at [facility], [place] [address] [place], [place] [address]    Report Status 11/06/2019 FINAL  Final    Radiology Studies: DG CHEST PORT 1 VIEW  Result Date: 11/06/2019 CLINICAL DATA:  COVID positive on 10/29/2019.  Short of breath. EXAM: PORTABLE CHEST 1 VIEW COMPARISON:  11/01/2019 FINDINGS: Patchy hazy airspace opacities in the mid and lower lungs bilaterally are similar to the prior exam. No new lung abnormalities. No convincing pleural effusion.  No pneumothorax. IMPRESSION: 1. Persistent bilateral airspace lung opacities consistent with multifocal COVID-19 pneumonia. No new lung abnormalities. Electronically Signed   By: Lajean Manes M.D.   On: 11/06/2019 13:15    Scheduled Meds: . amLODipine  2.5 mg Oral QHS  . vitamin C  500 mg Oral Daily  . aspirin  81 mg Oral QHS  . carvedilol  50 mg Oral BID  . clopidogrel  75 mg Oral Daily  . dexamethasone (DECADRON) injection  6 mg Intravenous Q24H  . enoxaparin (LOVENOX) injection  0.5 mg/kg Subcutaneous Q24H  .  famotidine  20 mg Oral QHS  . fluticasone  2 spray Each Nare TID  . folic acid  1 mg Oral Daily  . furosemide  40 mg Oral BID  . icosapent Ethyl  2 g Oral TID  . insulin aspart  0-20 Units Subcutaneous Q4H  . insulin aspart  20 Units Subcutaneous TID WC  . insulin detemir  50 Units Subcutaneous BID  . Ipratropium-Albuterol  1 puff Inhalation Q6H  . multivitamin with minerals  1 tablet Oral Daily  . oxymetazoline  1 spray Each Nare BID  . pregabalin  50 mg Oral BID  . QUEtiapine  200 mg Oral QHS  . rosuvastatin  20 mg Oral Daily  . sodium chloride flush  3 mL Intravenous Q12H  . Vitamin D (Ergocalciferol)  50,000 Units Oral Q Mon  . zinc sulfate  220 mg Oral Daily   Continuous Infusions: . sodium chloride      LOS: 8 days   Kerney Elbe, DO [facility] PAGER is on AMION  If 7PM-7AM, please contact night-coverage [email or web address]

## 2019-11-06 NOTE — Progress Notes (Signed)
Physical Therapy Treatment Patient Details Name: Reginald Duncan MRN: [id number] DOB: 03/17/1951 Today's Date: 11/06/2019    History of Present Illness 69 year old Caucasian male, obese, with multiple medical and cardiac problems.  Patient carries diagnosis of COPD, coronary artery disease, OSA, chronic kidney disease stage III, chronic pain, PVD, diabetes mellitus, hypertension, CVA and hyperlipidemia, in addition to documented dementia. Presented to ED 10/29/19 where he was found to be COVID+, febrile and SaO2 86-94%O2 on RA and admitted.     PT Comments    Pt on 6L O2 via [place] on entry, with SaO2 93%O2. Pt very upset that his oxygen demand had been 2L O2 prior to his nosebleed and he could not regain his prior level. Worked with pt to understand that any exertion is requiring increased supplemental O2 but that mobility is necessary to keep strength and aide in return to PLOF. Focus of session was to perform activity, while remaining calm and breathing in through his nose to optimize supplemental O2. Pt able to perform static standing x3 bouts for approximately 5 minutes each while maintaining SaO2 88-93%O2. Pt continues to have poor understanding of oxygen need and COVID disease process despite education. D/c plans remain appropriate at this time. PT will continue to follow acutely.     Follow Up Recommendations  SNF     Equipment Recommendations  None recommended by PT    Recommendations for Other Services OT consult     Precautions / Restrictions Precautions Precautions: None Restrictions Weight Bearing Restrictions: No    Mobility  Bed Mobility               General bed mobility comments: OOB in recliner   Transfers Overall transfer level: Needs assistance Equipment used: None;Rolling walker (2 wheeled) Transfers: Sit to/from Stand Sit to Stand: Min assist;Min guard         General transfer comment: minA for intial sit>stand and min guard for subsequent 2 sit>stand    Ambulation/Gait             General Gait Details: did not attempt due to anxiety         Balance Overall balance assessment: Needs assistance Sitting-balance support: No upper extremity supported;Feet supported;Feet unsupported Sitting balance-Leahy Scale: Good     Standing balance support: Single extremity supported;No upper extremity supported;Bilateral upper extremity supported Standing balance-Leahy Scale: Fair Standing balance comment: on intial standin g from recliner able to statically stand before reaching for RW                             Cognition Arousal/Alertness: Awake/alert Behavior During Therapy: Anxious Overall Cognitive Status: Within Functional Limits for tasks assessed                                 General Comments: continues to have poor understanding of condition       Exercises Other Exercises Other Exercises: 3x sit>stand with mindful nose breathing to maintain SaO2 >88%  Other Exercises: IS x10 with max 1250 mL     General Comments General comments (skin integrity, edema, etc.): On entry pt was on 6L O2 via [place] due to nosebleed and inability to recover SaO2 on previous level of O2, with therapy able to decrease to 4L and maintain SaO2 88-93%O2 with activity      Pertinent Vitals/Pain Pain Location: back, knees Pain Descriptors / Indicators: Aching;Sore  PT Goals (current goals can now be found in the care plan section) Acute Rehab PT Goals Patient Stated Goal: go home PT Goal Formulation: With patient Time For Goal Achievement: 11/18/19 Potential to Achieve Goals: Fair Progress towards PT goals: Progressing toward goals    Frequency    Min 2X/week      PT Plan Current plan remains appropriate       AM-PAC PT "6 Clicks" Mobility   Outcome Measure  Help needed turning from your back to your side while in a flat bed without using bedrails?: None Help needed moving from lying on your  back to sitting on the side of a flat bed without using bedrails?: None Help needed moving to and from a bed to a chair (including a wheelchair)?: None Help needed standing up from a chair using your arms (e.g., wheelchair or bedside chair)?: None Help needed to walk in hospital room?: None Help needed climbing 3-5 steps with a railing? : A Lot 6 Click Score: 22    End of Session Equipment Utilized During Treatment: Oxygen Activity Tolerance: Treatment limited secondary to medical complications (Comment)(anxiety) Patient left: in chair;with call bell/phone within reach Nurse Communication: Mobility status PT Visit Diagnosis: Unsteadiness on feet (R26.81);Muscle weakness (generalized) (M62.81);History of falling (Z91.81) Pain - Right/Left: (back)     Time: [id number] PT Time Calculation (min) (ACUTE ONLY): 24 min  Charges:  $Therapeutic Exercise: 23-37 mins                     Tyeler Goedken B. Migdalia Dk PT, DPT Acute Rehabilitation Services Pager [phone] Office [phone]    [place] 11/06/2019, 6:32 PM

## 2019-11-07 ENCOUNTER — Inpatient Hospital Stay (HOSPITAL_COMMUNITY): Payer: Medicare HMO

## 2019-11-07 LAB — CBC WITH DIFFERENTIAL/PLATELET
Abs Immature Granulocytes: 0.61 10*3/uL — ABNORMAL HIGH (ref 0.00–0.07)
Basophils Absolute: 0 10*3/uL (ref 0.0–0.1)
Basophils Relative: 1 %
Eosinophils Absolute: 0.2 10*3/uL (ref 0.0–0.5)
Eosinophils Relative: 3 %
HCT: 37.5 % — ABNORMAL LOW (ref 39.0–52.0)
Hemoglobin: 12.2 g/dL — ABNORMAL LOW (ref 13.0–17.0)
Immature Granulocytes: 10 %
Lymphocytes Relative: 11 %
Lymphs Abs: 0.7 10*3/uL (ref 0.7–4.0)
MCH: 29.8 pg (ref 26.0–34.0)
MCHC: 32.5 g/dL (ref 30.0–36.0)
MCV: 91.7 fL (ref 80.0–100.0)
Monocytes Absolute: 0.1 10*3/uL (ref 0.1–1.0)
Monocytes Relative: 2 %
Neutro Abs: 4.6 10*3/uL (ref 1.7–7.7)
Neutrophils Relative %: 73 %
Platelets: 171 10*3/uL (ref 150–400)
RBC: 4.09 MIL/uL — ABNORMAL LOW (ref 4.22–5.81)
RDW: 16.3 % — ABNORMAL HIGH (ref 11.5–15.5)
WBC: 6.3 10*3/uL (ref 4.0–10.5)
nRBC: 0.8 % — ABNORMAL HIGH (ref 0.0–0.2)

## 2019-11-07 LAB — COMPREHENSIVE METABOLIC PANEL
ALT: 62 U/L — ABNORMAL HIGH (ref 0–44)
AST: 27 U/L (ref 15–41)
Albumin: 2.6 g/dL — ABNORMAL LOW (ref 3.5–5.0)
Alkaline Phosphatase: 51 U/L (ref 38–126)
Anion gap: 12 (ref 5–15)
BUN: 57 mg/dL — ABNORMAL HIGH (ref 8–23)
CO2: 31 mmol/L (ref 22–32)
Calcium: 9.2 mg/dL (ref 8.9–10.3)
Chloride: 94 mmol/L — ABNORMAL LOW (ref 98–111)
Creatinine, Ser: 1.4 mg/dL — ABNORMAL HIGH (ref 0.61–1.24)
GFR calc Af Amer: 60 mL/min — ABNORMAL LOW (ref >60)
GFR calc non Af Amer: 52 mL/min — ABNORMAL LOW (ref >60)
Glucose, Bld: 183 mg/dL — ABNORMAL HIGH (ref 70–99)
Potassium: 4 mmol/L (ref 3.5–5.1)
Sodium: 137 mmol/L (ref 135–145)
Total Bilirubin: 0.8 mg/dL (ref 0.3–1.2)
Total Protein: 5.3 g/dL — ABNORMAL LOW (ref 6.5–8.1)

## 2019-11-07 LAB — GLUCOSE, CAPILLARY
Glucose-Capillary: 171 mg/dL — ABNORMAL HIGH (ref 70–99)
Glucose-Capillary: 199 mg/dL — ABNORMAL HIGH (ref 70–99)
Glucose-Capillary: 254 mg/dL — ABNORMAL HIGH (ref 70–99)
Glucose-Capillary: 272 mg/dL — ABNORMAL HIGH (ref 70–99)
Glucose-Capillary: 280 mg/dL — ABNORMAL HIGH (ref 70–99)
Glucose-Capillary: 306 mg/dL — ABNORMAL HIGH (ref 70–99)

## 2019-11-07 LAB — FERRITIN: Ferritin: 156 ng/mL (ref 24–336)

## 2019-11-07 LAB — C-REACTIVE PROTEIN: CRP: 1 mg/dL — ABNORMAL HIGH (ref <1.0)

## 2019-11-07 LAB — PHOSPHORUS: Phosphorus: 3.5 mg/dL (ref 2.5–4.6)

## 2019-11-07 LAB — LACTATE DEHYDROGENASE: LDH: 349 U/L — ABNORMAL HIGH (ref 98–192)

## 2019-11-07 LAB — MAGNESIUM: Magnesium: 1.7 mg/dL (ref 1.7–2.4)

## 2019-11-07 LAB — D-DIMER, QUANTITATIVE: D-Dimer, Quant: 0.5 ug/mL-FEU (ref 0.00–0.50)

## 2019-11-07 MED ORDER — SALINE SPRAY 0.65 % NA SOLN
1.0000 | NASAL | Status: DC | PRN
  Filled 2019-11-07: qty 44

## 2019-11-07 NOTE — Progress Notes (Signed)
PROGRESS NOTE    Reginald Duncan  [id number] DOB: 22-Mar-1951 DOA: 10/29/2019 PCP: [name], PA   Brief Narrative:  This is a 69 year old male, history of obesity, CAD s/p DES 2014, COPD, OSA, CKD 3a, chronic pain, PVD, diabetes, hypertension, CVA, hyperlipidemia, anxiety who presented with worsening shortness of breath times several weeks and new onset chest pain with associated wheezing and fever found to be Covid positive, febrile and tachycardic in ED as well as hypoxic requiring nasal cannula O2 and started on Decadron and remdesivir.  CRP noted to be elevated and patient started on Actemra x1 after risk/benefit discussion with patient on 1/7.  Also started on Ativan 1 mg as needed twice daily for severe anxiety.  Patient was offered to be transferred to [facility] however stated he wished to stay at [facility] for further treatment. Does not wish to be transferred to [facility].  1/9: Patient continued to have decline in respiratory status despite Actemra x1 and 4 days remdesivir and Decadron.  Pulmonology consulted.  Continued to have chest pain intermittently and states that he had chest pain the night before last but it is improved now. Troponin was flat  Does not feel like it is musculoskeletal.  Will check troponins and EKG and oxygen requirement weaning slowly and was down to 3 L but had an acute nose bleed and had to be placed on 6 Liters.  PT OT recommending skilled nursing facility and patient hesitant to go to skilled nursing facility.   Assessment & Plan:   Active Problems:   Acute on chronic respiratory failure (HCC)  Acute hypoxic respiratory failure secondary to COVID-19 with volume overload, improving  -Continues to have symptomatic improvement -CTA Chest: negative for PE, with multifocal pneumonia likely viral or atypical etiology -O2 requirement 12 L/min-> 10 L/min HFNC -> 3 Liters and now 6 Liters after Nose Bleed and is back on 3 Liters; Had another nosebleed  today -Started Oxymetazoline for his Nairs  -Repeat CXR on 11/06/2019 showed: "Persistent bilateral airspace lung opacities consistent with multifocal COVID-19 pneumonia. No new lung abnormalities  -Pulmonology consulted 1/9 without much to add -Status post Actemra 1/7 x 1 -Completed 5 days Remdesivir  -Started Dexamethasone and is on Day 9/10 -Trend Inflammatory Markers Daily and have trended down  Recent Labs    11/06/19 0224 11/07/19 0205  DDIMER 0.48 0.50  FERRITIN 171 156  LDH 372* 349*  CRP 1.4* 1.0*   Lab Results  Component Value Date   SARSCOV2NAA NEGATIVE 10/09/2019   SARSCOV2NAA NEGATIVE 09/25/2019   [place] NEGATIVE 09/15/2019  SpO2: 94 % O2 Flow Rate (L/min): 3 L/min -Diuresed well with IV Lasix but developed AKI which is improved; Resumed Home lasix yesterday AM at 40 mg po BID and Renal Fxn slightly elevated again but is now improved  -Strict I's and O's  -C/w Antitussives with Guaifenesin-Dextromethorphan 10 mL po q4hprn Cough and Chlorpheniramine-Hydrocodone 5 mL po q12hprn -Check Blood Cx x2 and showed NGTD at 5 Days -C/w Zinc 220 mg po Daily and Vitamin C  -Airborne and Contact Precautions -C/w Combivent Scheduled 1 puff IH q6h and Albuterol 2 puff IH q6hprn -Add Flutter Valve and Incentive Spirometry; Added Afrin Spray because of Nose bleed and Humidified Air  -Continue to Monitor and Trend Respiratory Status and repeat CXR and Inflammatory Markers in the AM -PT/OT Recommending SNF  Volume Overload -On PO lasix at home -Limited echo with improved EF from prior and is now 60-65% -Good diuresis with IV  Lasix, net -12.029 L since admit. Held IV Lasix 2/2 to AKI but resumed po Lasix and Renal Fxn again worsened and is improved  -Strict I/O and Daily weights;  -Continue to Monitor for S/Sx of Volume Overload   Viral Sepsis secondary to COVID 19 with initial concern for underlying HAP -Previously hypothermic, qSOFA: 2 (RR 31, SBP 90) - high risk, CT chest  with multifocal pna, no PE -Sepsis has since resolved -MRSA nares negative -No secondary pneumonia per pulmonology on CT scan, off Zosyn and monitor.  -Check sputum culture if fever -Repeat CXR as above   Lethargy, likely multifactorial: polypharmacy (seroquel/lyrica/xanax), hypoxia -Improved with med rec  COPD, not in exacerbation -Continue IV steroids and inhalers -Pulm Consulted: Combivent inhaler with technique/strength assessment at bedside by RT Stanton Kidney recommends outpatient pulmonary follow-up to assure optimize pulmonary treatment and will need official PFTs  Severe anxiety over healthcare -On xanax per outpatient regimen -Continue decreased seroquel 200 mg QHS  -Needs Reassurance and anticipating D/C to SNF soon   Neuropathy -Continue to decrease Lyrica due to lethargy; Now on 50 mg po BID   Thrombocytopenia, likely reactive -Resolved; his platelet count is now 150,000  Hypoglycemia -Resolved with D5W, now hyperglycemic in setting of steroids -CBGs have been now ranging from 159-319  Uncontrolled Diabetes with Hyperglycemia  -HbA1C 10.0 -Per Diabetic coordinator Recc's: Increase Levemir to 40 units twice daily and continue NovoLog 15 units 3 times daily while on steroids and also on Resistant Novolog SSI q4h  OSA -CPAP on hold due to COVID 19 protocol  AKI on CKD 3a  -Creatinine was 1.55, baseline 1.2-1.3 and had improved but worsened again. BUN/Cr was 47/1.36 the day before yesterday and repeat was 51/1.54; Today was 57/1.40  -Continue Holding Lisinopril for now -Resumed Lasix yesterday and may just go to once a day if Renal Fxn continues to worsen but since it is stable -Nephrotoxic medications, contrast dyes, hypotension and renally adjust medications-continue monitor and trend renal function  -repeat CMP in a.m.  Chest pain: More likely MSK and anxiety, improved  -Well Score: 0, low risk, PERC: cannot rule out PE, D Dimer negative -CTA without PE, with  multifocal pneumonia -Cycle HS Troponin and check EKG; Troponin Negative and EKG never repeated when ordered  -Does not have reproducible pain on palpation -Will get Cardiology opinion given history of CAD once Troponin and EKG are resulted   Hypertension -Continue Amlodipine 2.5 mg po qHS and Carvedilol 50 mg po BID   CAD s/p DES 2014 -Status post left heart cath 09/01/2019 and showed "Multivessel coronary artery disease with 50-60% mid LAD stenosis and chronic total occlusion of the distal LAD, as well as 70% D2 lesion and sequential 30-40% proximal and 50-60% distal RCA stenoses. Ostial/proximal LAD stent is widely patent. Mildly to moderately left ventricular filling pressure (LVEDP ~24 mmHg)."  -Continue dual antiplatelets with ASA 81 mg po Daily, Clopidogrel 75 mg po Daily, Carvedilol 50 mg po BID and Rosuvastatin 20 mg po daily  -Check HS Troponin and EKG given CP -May Discuss with Cardiology and get formal consultation after EKG and Troponins are checked; Troponin Negative but EKG never done. So will order again   Obesity -Estimated body mass index is 38.04 kg/m as calculated from the following:   Height as of this encounter: 6\' 1"  (1.854 m).   Weight as of this encounter: 130.8 kg. -Weight Loss and Dietary Counseling given   DVT prophylaxis: Enoxaparin 65 mg sq q24h Code Status: FULL CODE  Family Communication: No family present at bedside  Disposition Plan: SNF when medically stable   Consultants:   PCCM/Pulmonary  Discussed with Cardiology Dr. Gwenlyn Found    Procedures:  ECHOCARDIOGRAM IMPRESSIONS    1. Technically difficult study with limited views even after administration of contrast. Left ventricular ejection fraction, by visual estimation, is 60 to 65%. The left ventricle has normal function. There is mildly increased left ventricular wall  thickness.  2. Right ventricle was not well visualized. On limited views appears mildly dilated with grossly normal systolic  function  3. The mitral valve is normal in structure. No evidence of mitral valve regurgitation.  4. The tricuspid valve was grossly normal. Tricuspid valve regurgitation is not demonstrated.  5. The aortic valve was not well visualized. Aortic valve regurgitation is not visualized.  6. TR signal is inadequate for assessing pulmonary artery systolic pressure.  FINDINGS  Left Ventricle: Left ventricular ejection fraction, by visual estimation, is 60 to 65%. The left ventricle has normal function. There is mildly increased left ventricular wall thickness.  Right Ventricle: The right ventricular size is NWV. Right vetricular wall thickness was not assessed. Global RV systolic function is was not well visualized.  Left Atrium: Left atrial size was not well visualized.  Right Atrium: Right atrial size was not well visualized. Right atrial pressure is estimated at 3 mmHg.  Pericardium: Trivial pericardial effusion is present is seen. Trivial pericardial effusion is present.  Mitral Valve: The mitral valve is normal in structure. No evidence of mitral valve regurgitation.  Tricuspid Valve: The tricuspid valve is grossly normal. Tricuspid valve regurgitation is not demonstrated.  Aortic Valve: The aortic valve was not well visualized. Aortic valve regurgitation is not visualized.  Pulmonic Valve: The pulmonic valve was not well visualized. Pulmonic valve regurgitation is not visualized by color flow Doppler. Pulmonic regurgitation is not visualized by color flow Doppler.  Aorta: The aortic root is normal in size and structure.  Shunts: The interatrial septum was not well visualized.    LEFT VENTRICLE          Normals PLAX 2D LVIDd:         5.20 cm  3.6 cm LVIDs:         3.00 cm  1.7 cm LV PW:         1.20 cm  1.4 cm LV IVS:        1.10 cm  1.3 cm LVOT diam:     2.10 cm  2.0 cm LV SV:         95 ml    79 ml LV SV Index:   34.99    45 ml/m2 LVOT Area:     3.46 cm 3.14 cm2     LEFT ATRIUM         Index LA diam:    3.60 cm 1.41 cm/m    AORTA                 Normals Ao Root diam: 3.70 cm 31 mm    SHUNTS Systemic Diam: 2.10 cm  Antimicrobials:  Anti-infectives (From admission, onward)   Start     Dose/Rate Route Frequency Ordered Stop   11/01/19 1630  piperacillin-tazobactam (ZOSYN) IVPB 3.375 g  Status:  Discontinued     3.375 g 12.5 mL/hr over 240 Minutes Intravenous Every 8 hours 11/01/19 1609 11/03/19 1244   10/31/19 1000  remdesivir 100 mg in sodium chloride 0.9 % 100 mL IVPB  Status:  Discontinued  100 mg 200 mL/hr over 30 Minutes Intravenous Daily 10/30/19 0058 10/30/19 0154   10/31/19 1000  remdesivir 100 mg in sodium chloride 0.9 % 100 mL IVPB     100 mg 200 mL/hr over 30 Minutes Intravenous Every 24 hours 10/30/19 0837 11/03/19 1057   10/30/19 2200  remdesivir 100 mg in sodium chloride 0.9 % 100 mL IVPB  Status:  Discontinued     100 mg 200 mL/hr over 30 Minutes Intravenous Daily 10/29/19 2257 10/30/19 0835   10/30/19 0200  azithromycin (ZITHROMAX) 500 mg in sodium chloride 0.9 % 250 mL IVPB  Status:  Discontinued     500 mg 250 mL/hr over 60 Minutes Intravenous Every 24 hours 10/30/19 0058 10/31/19 1643   10/30/19 0100  remdesivir 200 mg in sodium chloride 0.9% 250 mL IVPB  Status:  Discontinued     200 mg 580 mL/hr over 30 Minutes Intravenous Once 10/30/19 0058 10/30/19 0404   10/29/19 2300  remdesivir 200 mg in sodium chloride 0.9% 250 mL IVPB  Status:  Discontinued     200 mg 580 mL/hr over 30 Minutes Intravenous Once 10/29/19 2257 10/30/19 0835     Subjective: Patient seen and examined at bedside and he states that his chest pain is improved but states that his nosebleeding was bothering him and he states that he is worried to leave the hospital as he may end up coming back.  No nausea or vomiting.  Still has not had a bowel movement but states that he goes once a week.  No lightheadedness or dizziness.  Oxygen requirement is  improving and nursing saw him requiring no oxygen sometimes with saturations in the 90s.  Had another nosebleed today but he picked his nose.  No other concerns or complaints at this time.  Objective: Vitals:   11/07/19 0455 11/07/19 0458 11/07/19 0916 11/07/19 1200  BP: 117/72  109/68 119/67  Pulse: 78  81 82  Resp: (!) 21  20 18   Temp: 98.3 F (36.8 C)  97.8 F (36.6 C) 98 F (36.7 C)  TempSrc: Oral  Oral Oral  SpO2: 91%  91% 94%  Weight:  130.8 kg    Height:        Intake/Output Summary (Last 24 hours) at 11/07/2019 1649 Last data filed at 11/07/2019 1500 Gross per 24 hour  Intake 1850 ml  Output 2700 ml  Net -850 ml   Filed Weights   11/05/19 0623 11/06/19 0417 11/07/19 0458  Weight: 131.5 kg 130.8 kg 130.8 kg   Examination: Physical Exam:  Constitutional: WN/WD obese Caucasian male currently in no acute distress appears anxious Eyes:  Lids and conjunctivae normal, sclerae anicteric  ENMT: External Ears, Nose appear normal but he does have some dried blood in his nares. Grossly normal hearing. Mucous membranes are moist.  Neck: Appears normal, supple, no cervical masses, normal ROM, no appreciable thyromegaly Respiratory: Diminished to auscultation bilaterally with coarse breath sounds, no wheezing, rales, rhonchi or crackles. Normal respiratory effort and patient is not tachypenic. No accessory muscle use.  Liters of supplemental oxygen via nasal cannula normal tone Cardiovascular: RRR, no murmurs / rubs / gallops. S1 and S2 auscultated.  Has 1+ lower extremity edema Abdomen: Soft, non-tender, distended secondary body habitus. No masses palpated. No appreciable hepatosplenomegaly. Bowel sounds positive.  GU: Deferred. Musculoskeletal: No clubbing / cyanosis of digits/nails. No joint deformity upper and lower extremities.  Skin: No rashes, lesions, ulcers on a limited skin evaluation. No induration; Warm and dry.  Neurologic: CN 2-12 grossly intact with no focal deficits.   Romberg sign and cerebellar reflexes not assessed.  Psychiatric: Normal judgment and insight. Alert and oriented x 3. Normal mood and appropriate affect.   Data Reviewed: I have personally reviewed following labs and imaging studies  CBC: Recent Labs  Lab 11/01/19 0250 11/01/19 0250 11/02/19 0036 11/02/19 0036 11/03/19 [id number] 11/04/19 0419 11/05/19 0350 11/06/19 0224 11/07/19 0205  WBC 4.6   < > 4.7   < > 5.2 6.3 4.9 5.8 6.3  NEUTROABS 3.9  --  4.0  --  4.2  --   --  4.2 4.6  HGB 11.4*   < > 12.5*   < > 13.0 13.4 12.9* 13.2 12.2*  HCT 34.9*   < > 39.2   < > 41.5 41.6 39.9 41.6 37.5*  MCV 91.6   < > 92.5   < > 93.5 93.1 91.9 92.7 91.7  PLT 111*   < > 124*   < > 157 150 150 176 171   < > = values in this interval not displayed.   Basic Metabolic Panel: Recent Labs  Lab 11/01/19 0250 11/01/19 0910 11/02/19 0036 11/02/19 0036 11/03/19 [id number] 11/04/19 0419 11/05/19 0350 11/06/19 0224 11/07/19 0205  NA  --    < > 138   < > 139 140 137 137 137  K  --    < > 4.5   < > 4.4 4.5 4.1 3.8 4.0  CL  --    < > 96*   < > 94* 96* 97* 92* 94*  CO2  --    < > 31   < > 33* 36* 30 35* 31  GLUCOSE  --    < > 280*   < > 238* 131* 152* 233* 183*  BUN  --    < > 50*   < > 48* 51* 47* 51* 57*  CREATININE  --    < > 1.50*   < > 1.52* 1.55* 1.36* 1.54* 1.40*  CALCIUM  --    < > 8.8*   < > 9.0 9.2 9.2 9.0 9.2  MG 1.9  --  2.0   < > 1.9 1.9 1.9 1.8 1.7  PHOS 3.4  --  3.3  --  3.8  --   --  3.3 3.5   < > = values in this interval not displayed.   GFR: Estimated Creatinine Clearance: 72.6 mL/min (A) (by C-G formula based on SCr of 1.4 mg/dL (H)). Liver Function Tests: Recent Labs  Lab 11/05/19 0350 11/06/19 0224 11/07/19 0205  AST 39 30 27  ALT 76* 70* 62*  ALKPHOS 42 56 51  BILITOT 0.4 0.6 0.8  PROT 5.4* 5.4* 5.3*  ALBUMIN 2.5* 2.7* 2.6*   No results for input(s): LIPASE, AMYLASE in the last 168 hours. No results for input(s): AMMONIA in the last 168 hours. Coagulation Profile: No  results for input(s): INR, PROTIME in the last 168 hours. Cardiac Enzymes: No results for input(s): CKTOTAL, CKMB, CKMBINDEX, TROPONINI in the last 168 hours. BNP (last 3 results) No results for input(s): PROBNP in the last 8760 hours. HbA1C: No results for input(s): HGBA1C in the last 72 hours. CBG: Recent Labs  Lab 11/07/19 0001 11/07/19 0330 11/07/19 0918 11/07/19 1145 11/07/19 1542  GLUCAP 199* 171* 272* 306* 280*   Lipid Profile: No results for input(s): CHOL, HDL, LDLCALC, TRIG, CHOLHDL, LDLDIRECT in the last 72 hours. Thyroid Function Tests: No results for input(s): TSH, T4TOTAL,  FREET4, T3FREE, THYROIDAB in the last 72 hours. Anemia Panel: Recent Labs    11/06/19 0224 11/07/19 0205  FERRITIN 171 156   Sepsis Labs: Recent Labs  Lab 11/01/19 1851 11/02/19 0042  LATICACIDVEN 2.4* 2.1*    Recent Results (from the past 240 hour(s))  Culture, blood (routine x 2)     Status: None   Collection Time: 11/01/19  7:00 PM   Specimen: BLOOD RIGHT HAND  Result Value Ref Range Status   Specimen Description BLOOD RIGHT HAND  Final   Special Requests   Final    BOTTLES DRAWN AEROBIC ONLY Blood Culture adequate volume   Culture   Final    NO GROWTH 5 DAYS Performed at [facility], [place] [address], [place], [place] [address]    Report Status 11/06/2019 FINAL  Final  Culture, blood (routine x 2)     Status: None   Collection Time: 11/01/19  7:10 PM   Specimen: BLOOD RIGHT HAND  Result Value Ref Range Status   Specimen Description BLOOD RIGHT HAND  Final   Special Requests   Final    BOTTLES DRAWN AEROBIC ONLY Blood Culture adequate volume   Culture   Final    NO GROWTH 5 DAYS Performed at [facility], [place] [address] [place], [place] [address]    Report Status 11/06/2019 FINAL  Final    Radiology Studies: DG CHEST PORT 1 VIEW  Result Date: 11/07/2019 CLINICAL DATA:  Shortness of breath. EXAM: PORTABLE CHEST 1 VIEW COMPARISON:  11/06/2019 FINDINGS:  Lungs are adequately inflated with stable patchy airspace opacification over the mid to lower lungs likely infection. No effusion. Stable cardiomegaly. Remainder of the exam is unchanged. IMPRESSION: Stable patchy airspace process over the mid to lower lungs likely multifocal infection. Stable cardiomegaly. Electronically Signed   By: Marin Olp M.D.   On: 11/07/2019 08:00   DG CHEST PORT 1 VIEW  Result Date: 11/06/2019 CLINICAL DATA:  COVID positive on 10/29/2019.  Short of breath. EXAM: PORTABLE CHEST 1 VIEW COMPARISON:  11/01/2019 FINDINGS: Patchy hazy airspace opacities in the mid and lower lungs bilaterally are similar to the prior exam. No new lung abnormalities. No convincing pleural effusion.  No pneumothorax. IMPRESSION: 1. Persistent bilateral airspace lung opacities consistent with multifocal COVID-19 pneumonia. No new lung abnormalities. Electronically Signed   By: Lajean Manes M.D.   On: 11/06/2019 13:15    Scheduled Meds: . amLODipine  2.5 mg Oral QHS  . vitamin C  500 mg Oral Daily  . aspirin  81 mg Oral QHS  . carvedilol  50 mg Oral BID  . clopidogrel  75 mg Oral Daily  . dexamethasone (DECADRON) injection  6 mg Intravenous Q24H  . enoxaparin (LOVENOX) injection  0.5 mg/kg Subcutaneous Q24H  . famotidine  20 mg Oral QHS  . fluticasone  2 spray Each Nare TID  . folic acid  1 mg Oral Daily  . furosemide  40 mg Oral BID  . icosapent Ethyl  2 g Oral BID  . insulin aspart  0-20 Units Subcutaneous Q4H  . insulin aspart  20 Units Subcutaneous TID WC  . insulin detemir  50 Units Subcutaneous BID  . Ipratropium-Albuterol  1 puff Inhalation Q6H  . multivitamin with minerals  1 tablet Oral Daily  . oxymetazoline  1 spray Each Nare BID  . pregabalin  50 mg Oral BID  . QUEtiapine  200 mg Oral QHS  . rosuvastatin  20 mg Oral Daily  .  sodium chloride flush  3 mL Intravenous Q12H  . Vitamin D (Ergocalciferol)  50,000 Units Oral Q Mon  . zinc sulfate  220 mg Oral Daily    Continuous Infusions: . sodium chloride      LOS: 9 days   Kerney Elbe, DO [facility] PAGER is on AMION  If 7PM-7AM, please contact night-coverage [email or web address]

## 2019-11-07 NOTE — Plan of Care (Signed)
  Problem: Education: Goal: Knowledge of risk factors and measures for prevention of condition will improve Outcome: Not Progressing   Problem: Coping: Goal: Psychosocial and spiritual needs will be supported Outcome: Not Progressing   Problem: Respiratory: Goal: Will maintain a patent airway Outcome: Not Progressing Goal: Complications related to the disease process, condition or treatment will be avoided or minimized Outcome: Not Progressing   Problem: Education: Goal: Knowledge of General Education information will improve Description: Including pain rating scale, medication(s)/side effects and non-pharmacologic comfort measures Outcome: Not Progressing   Problem: Health Behavior/Discharge Planning: Goal: Ability to manage health-related needs will improve Outcome: Not Progressing   Problem: Clinical Measurements: Goal: Ability to maintain clinical measurements within normal limits will improve Outcome: Not Progressing Goal: Will remain free from infection Outcome: Not Progressing Goal: Diagnostic test results will improve Outcome: Not Progressing Goal: Respiratory complications will improve Outcome: Not Progressing Goal: Cardiovascular complication will be avoided Outcome: Not Progressing   Problem: Activity: Goal: Risk for activity intolerance will decrease Outcome: Not Progressing   Problem: Nutrition: Goal: Adequate nutrition will be maintained Outcome: Not Progressing   Problem: Coping: Goal: Level of anxiety will decrease Outcome: Not Progressing   Problem: Elimination: Goal: Will not experience complications related to bowel motility Outcome: Not Progressing Goal: Will not experience complications related to urinary retention Outcome: Not Progressing   Problem: Pain Managment: Goal: General experience of comfort will improve Outcome: Not Progressing   Problem: Safety: Goal: Ability to remain free from injury will improve Outcome: Not Progressing    Problem: Skin Integrity: Goal: Risk for impaired skin integrity will decrease Outcome: Not Progressing   Problem: Education: Goal: Ability to demonstrate management of disease process will improve Outcome: Not Progressing Goal: Ability to verbalize understanding of medication therapies will improve Outcome: Not Progressing Goal: Individualized Educational Video(s) Outcome: Not Progressing   Problem: Activity: Goal: Capacity to carry out activities will improve Outcome: Not Progressing   Problem: Cardiac: Goal: Ability to achieve and maintain adequate cardiopulmonary perfusion will improve Outcome: Not Progressing

## 2019-11-07 NOTE — TOC Progression Note (Addendum)
Transition of Care [facility]) - Progression Note    Patient Details  Name: Reginald Duncan MRN: [id number] Date of Birth: 1951-07-06  Transition of Care [facility]) CM/SW [place], LCSW Phone Number: 11/07/2019, 10:46 AM  Clinical Narrative:    CSW spoke with patient regarding SNF. Patient reports being upset that he will have to leave the hospital. He states he is very afraid to leave the safety of the hospital and go to SNF. CSW explained that the MD will determine when he can leave the hospital medically. Patient states that he will refuse to go to [facility] for SNF. CSW will try to see if a [facility] will have availability for him when ready.    Update: no beds at [facility] until Monday. [facility] is reviewing referral.    Expected Discharge Plan: [place] Barriers to Discharge: Continued Medical Work up  Expected Discharge Plan and Services Expected Discharge Plan: [place] arrangements for the past 2 months: Single Family Home Expected Discharge Date: 11/04/19                                     Social Determinants of Health (SDOH) Interventions    Readmission Risk Interventions Readmission Risk Prevention Plan 10/31/2019 09/27/2019  Transportation Screening Complete Complete  PCP or Specialist Appt within 3-5 Days - Complete  HRI or [place] - Complete  Social Work Consult for [place] Planning/Counseling - Complete  Palliative Care Screening - Not Applicable  Medication Review Press photographer) Complete Complete  PCP or Specialist appointment within 3-5 days of discharge Complete -  [place] or Home Care Consult Complete -  Some recent data might be hidden

## 2019-11-07 NOTE — Progress Notes (Signed)
Updated brother [name] on the phone per patient request on patient status and plan of care.

## 2019-11-08 ENCOUNTER — Inpatient Hospital Stay (HOSPITAL_COMMUNITY): Payer: Medicare HMO

## 2019-11-08 LAB — GLUCOSE, CAPILLARY
Glucose-Capillary: 105 mg/dL — ABNORMAL HIGH (ref 70–99)
Glucose-Capillary: 128 mg/dL — ABNORMAL HIGH (ref 70–99)
Glucose-Capillary: 144 mg/dL — ABNORMAL HIGH (ref 70–99)
Glucose-Capillary: 165 mg/dL — ABNORMAL HIGH (ref 70–99)
Glucose-Capillary: 201 mg/dL — ABNORMAL HIGH (ref 70–99)
Glucose-Capillary: 259 mg/dL — ABNORMAL HIGH (ref 70–99)

## 2019-11-08 LAB — CBC WITH DIFFERENTIAL/PLATELET
Abs Immature Granulocytes: 0.54 10*3/uL — ABNORMAL HIGH (ref 0.00–0.07)
Basophils Absolute: 0 10*3/uL (ref 0.0–0.1)
Basophils Relative: 0 %
Eosinophils Absolute: 0.2 10*3/uL (ref 0.0–0.5)
Eosinophils Relative: 2 %
HCT: 39.4 % (ref 39.0–52.0)
Hemoglobin: 12.7 g/dL — ABNORMAL LOW (ref 13.0–17.0)
Immature Granulocytes: 7 %
Lymphocytes Relative: 9 %
Lymphs Abs: 0.7 10*3/uL (ref 0.7–4.0)
MCH: 29.7 pg (ref 26.0–34.0)
MCHC: 32.2 g/dL (ref 30.0–36.0)
MCV: 92.3 fL (ref 80.0–100.0)
Monocytes Absolute: 0.2 10*3/uL (ref 0.1–1.0)
Monocytes Relative: 3 %
Neutro Abs: 5.9 10*3/uL (ref 1.7–7.7)
Neutrophils Relative %: 79 %
Platelets: 164 10*3/uL (ref 150–400)
RBC: 4.27 MIL/uL (ref 4.22–5.81)
RDW: 16.6 % — ABNORMAL HIGH (ref 11.5–15.5)
WBC: 7.5 10*3/uL (ref 4.0–10.5)
nRBC: 0.5 % — ABNORMAL HIGH (ref 0.0–0.2)

## 2019-11-08 LAB — FERRITIN: Ferritin: 155 ng/mL (ref 24–336)

## 2019-11-08 LAB — COMPREHENSIVE METABOLIC PANEL
ALT: 70 U/L — ABNORMAL HIGH (ref 0–44)
AST: 32 U/L (ref 15–41)
Albumin: 2.8 g/dL — ABNORMAL LOW (ref 3.5–5.0)
Alkaline Phosphatase: 49 U/L (ref 38–126)
Anion gap: 10 (ref 5–15)
BUN: 50 mg/dL — ABNORMAL HIGH (ref 8–23)
CO2: 35 mmol/L — ABNORMAL HIGH (ref 22–32)
Calcium: 9.2 mg/dL (ref 8.9–10.3)
Chloride: 94 mmol/L — ABNORMAL LOW (ref 98–111)
Creatinine, Ser: 1.37 mg/dL — ABNORMAL HIGH (ref 0.61–1.24)
GFR calc Af Amer: >60 mL/min (ref >60)
GFR calc non Af Amer: 53 mL/min — ABNORMAL LOW (ref >60)
Glucose, Bld: 140 mg/dL — ABNORMAL HIGH (ref 70–99)
Potassium: 3.9 mmol/L (ref 3.5–5.1)
Sodium: 139 mmol/L (ref 135–145)
Total Bilirubin: 0.8 mg/dL (ref 0.3–1.2)
Total Protein: 5.5 g/dL — ABNORMAL LOW (ref 6.5–8.1)

## 2019-11-08 LAB — D-DIMER, QUANTITATIVE: D-Dimer, Quant: 0.4 ug/mL-FEU (ref 0.00–0.50)

## 2019-11-08 LAB — LACTATE DEHYDROGENASE: LDH: 297 U/L — ABNORMAL HIGH (ref 98–192)

## 2019-11-08 LAB — C-REACTIVE PROTEIN: CRP: 0.8 mg/dL (ref <1.0)

## 2019-11-08 LAB — MAGNESIUM: Magnesium: 1.8 mg/dL (ref 1.7–2.4)

## 2019-11-08 LAB — PHOSPHORUS: Phosphorus: 3.9 mg/dL (ref 2.5–4.6)

## 2019-11-08 NOTE — TOC Progression Note (Signed)
Transition of Care [facility]) - Progression Note    Patient Details  Name: Reginald Duncan MRN: [id number] Date of Birth: 15-Mar-1951  Transition of Care [facility]) CM/SW [place], LCSW Phone Number: 11/08/2019, 2:30 PM  Clinical Narrative:    Per Mendel Corning, they have been unable to get [facility] to answer the phone to verify patient's SNF benefits. CSW also tried to call [facility] and was unsuccessful. [place] will continue trying to see if patient can discharge there tomorrow. Weekend CSW will follow up.    Expected Discharge Plan: [place] Barriers to Discharge: Continued Medical Work up  Expected Discharge Plan and Services Expected Discharge Plan: [place] arrangements for the past 2 months: Single Family Home Expected Discharge Date: 11/04/19                                     Social Determinants of Health (SDOH) Interventions    Readmission Risk Interventions Readmission Risk Prevention Plan 10/31/2019 09/27/2019  Transportation Screening Complete Complete  PCP or Specialist Appt within 3-5 Days - Complete  HRI or [place] - Complete  Social Work Consult for [place] Planning/Counseling - Complete  Palliative Care Screening - Not Applicable  Medication Review Press photographer) Complete Complete  PCP or Specialist appointment within 3-5 days of discharge Complete -  [place] or Home Care Consult Complete -  Some recent data might be hidden

## 2019-11-08 NOTE — Progress Notes (Signed)
PROGRESS NOTE    Reginald Duncan  [id number] DOB: [birth year] DOA: 10/29/2019 PCP: [name], PA   Brief Narrative:  This is a 69 year old male, history of obesity, CAD s/p DES 2014, COPD, OSA, CKD 3a, chronic pain, PVD, diabetes, hypertension, CVA, hyperlipidemia, anxiety who presented with worsening shortness of breath times several weeks and new onset chest pain with associated wheezing and fever found to be Covid positive, febrile and tachycardic in ED as well as hypoxic requiring nasal cannula O2 and started on Decadron and remdesivir.  CRP noted to be elevated and patient started on Actemra x1 after risk/benefit discussion with patient on 1/7.  Also started on Ativan 1 mg as needed twice daily for severe anxiety.  Patient was offered to be transferred to [facility] however stated he wished to stay at [facility] for further treatment. Does not wish to be transferred to [facility].  1/9: Patient continued to have decline in respiratory status despite Actemra x1 and 4 days remdesivir and Decadron.  Pulmonology consulted.  Continued to have chest pain intermittently and states that he had chest pain the night before last but it is improved now. Troponin was flat  Does not feel like it is musculoskeletal.  Will check troponins and EKG and oxygen requirement weaning slowly and was down to 3 L but had an acute nose bleed and had to be placed on 6 Liters.  Only he has been weaned off of all supplemental oxygen via nasal cannula and his inflammatory markers have improved significantly.  PT OT recommending skilled nursing facility and patient hesitant to go to skilled nursing facility but is agreeable and have asked social work to do assist with placement.  Anticipate discharging to SNF next 24 to 48 hours.  Assessment & Plan:   Active Problems:   Acute on chronic respiratory failure (HCC)  Acute hypoxic respiratory failure secondary to COVID-19 with volume overload, improving and was able to be weaned  to room air -Continues to have symptomatic improvement -CTA Chest: negative for PE, with multifocal pneumonia likely viral or atypical etiology -O2 requirement 12 L/min-> 10 L/min HFNC -> 3 Liters and now 6 Liters after Nose Bleed and is back on 3 Liters; Had another nosebleed yesterday but is now on room air -Started Oxymetazoline for his Nairs  -Repeat CXR on 11/06/2019 showed: "Persistent bilateral airspace lung opacities consistent with multifocal COVID-19 pneumonia. No new lung abnormalities  -Pulmonology consulted 1/9 without much to add -Status post Actemra 1/7 x 1 -Completed 5 days Remdesivir  -Started Dexamethasone and is on Day 9/10 -Trend Inflammatory Markers Daily and have trended down  Recent Labs    11/06/19 0224 11/07/19 0205 11/08/19 0518  DDIMER 0.48 0.50 0.40  FERRITIN 171 156 155  LDH 372* 349* 297*  CRP 1.4* 1.0* 0.8   Lab Results  Component Value Date   SARSCOV2NAA NEGATIVE 10/09/2019   SARSCOV2NAA NEGATIVE 09/25/2019   [place] NEGATIVE 09/15/2019  SpO2: 94 % O2 Flow Rate (L/min): 1 L/min -Diuresed well with IV Lasix but developed AKI which is improved; Resumed Home lasix at 40 mg po BID and Renal Fxn slightly elevated again but is now improved and trending down -Strict I's and O's  -C/w Antitussives with Guaifenesin-Dextromethorphan 10 mL po q4hprn Cough and Chlorpheniramine-Hydrocodone 5 mL po q12hprn -Check Blood Cx x2 and showed NGTD at 5 Days -C/w Zinc 220 mg po Daily and Vitamin C  -Airborne and Contact Precautions -C/w Combivent Scheduled 1 puff IH q6h and Albuterol  2 puff IH q6hprn -Add Flutter Valve and Incentive Spirometry; Added Afrin Spray because of Nose bleed and Humidified Air  -Continue to Monitor and Trend Respiratory Status and repeat CXR and Inflammatory Markers in the AM -PT/OT Recommending SNF and anticipating discharge in next 24 to 48 hours  Volume Overload -On PO lasix at home -Limited echo with improved EF from prior and is now  60-65% -Good diuresis with IV Lasix, net -12.300 L since admit. Held IV Lasix 2/2 to AKI but resumed po Lasix and Renal Fxn again worsened and is improved  -Strict I/O and Daily weights;  -Continue to Monitor for S/Sx of Volume Overload   Viral Sepsis secondary to COVID 19 with initial concern for underlying HAP -Previously hypothermic, qSOFA: 2 (RR 31, SBP 90) - high risk, CT chest with multifocal pna, no PE -Sepsis has since resolved -MRSA nares negative -No secondary pneumonia per pulmonology on CT scan, off Zosyn and monitor.  -Check sputum culture if fever -Repeat CXR as above   Lethargy, likely multifactorial: polypharmacy (seroquel/lyrica/xanax), hypoxia -Improved with med rec  COPD, not in exacerbation -Continue IV steroids and inhalers -Pulm Consulted: Combivent inhaler with technique/strength assessment at bedside by RT Stanton Kidney recommends outpatient pulmonary follow-up to assure optimize pulmonary treatment and will need official PFTs  Severe anxiety over healthcare -On xanax per outpatient regimen -Continue decreased seroquel 200 mg QHS  -Needs Reassurance and anticipating D/C to SNF soon   Neuropathy -Continue to decrease Lyrica due to lethargy; Now on 50 mg po BID   Thrombocytopenia, likely reactive -Resolved; his platelet count is now 150,000  Hypoglycemia -Resolved with D5W, now hyperglycemic in setting of steroids -CBGs have been now ranging from 105-165  Uncontrolled Diabetes with Hyperglycemia  -HbA1C 10.0 -Per Diabetic coordinator Recc's: Increase Levemir to 40 units twice daily and continue NovoLog 15 units 3 times daily while on steroids and also on Resistant Novolog SSI q4h -CBG's ranging rom 105-165  OSA -CPAP on hold due to COVID 19 protocol  AKI on CKD 3a  -Creatinine was 1.55, baseline 1.2-1.3 and had improved but worsened again. BUN/Cr was 47/1.36 the day before yesterday and repeat was 51/1.54; Today was 50/1.37 -Continue Holding Lisinopril for  now -Resumed Lasix yesterday and may just go to once a day if Renal Fxn continues to worsen but since it is stable -Nephrotoxic medications, contrast dyes, hypotension and renally adjust medications-continue monitor and trend renal function  -repeat CMP in a.m.  Chest pain: More likely MSK and anxiety, improved  -Well Score: 0, low risk, PERC: cannot rule out PE, D Dimer negative -CTA without PE, with multifocal pneumonia -Cycle HS Troponin and check EKG; Troponin Negative and EKG never repeated when ordered  -Does not have reproducible pain on palpation -Will get Cardiology opinion given history of CAD once Troponin and EKG are resulted   Hypertension -Continue Amlodipine 2.5 mg po qHS and Carvedilol 50 mg po BID   CAD s/p DES 2014 -Status post left heart cath 09/01/2019 and showed "Multivessel coronary artery disease with 50-60% mid LAD stenosis and chronic total occlusion of the distal LAD, as well as 70% D2 lesion and sequential 30-40% proximal and 50-60% distal RCA stenoses. Ostial/proximal LAD stent is widely patent. Mildly to moderately left ventricular filling pressure (LVEDP ~24 mmHg)."  -Continue dual antiplatelets with ASA 81 mg po Daily, Clopidogrel 75 mg po Daily, Carvedilol 50 mg po BID and Rosuvastatin 20 mg po daily  -Check HS Troponin and EKG given CP -May Discuss with  Cardiology and get formal consultation after EKG and Troponins are checked; Troponin Negative but EKG never done. So will order again   Obesity -Estimated body mass index is 38.39 kg/m as calculated from the following:   Height as of this encounter: 6\' 1"  (1.854 m).   Weight as of this encounter: 132 kg. -Weight Loss and Dietary Counseling given   Elevated ALT  -In the setting of COVID-19 disease  -Patient's ALT is has been elevated since January 6 the least and has trended up to 75 and currently is now 72 -Continue to monitor and trend and repeat CMP in a.m.  DVT prophylaxis: Enoxaparin 65 mg sq  q24h Code Status: FULL CODE  Family Communication: No family present at bedside  Disposition Plan: SNF when medically stable   Consultants:   PCCM/Pulmonary  Discussed with Cardiology Dr. Gwenlyn Found    Procedures:  ECHOCARDIOGRAM IMPRESSIONS    1. Technically difficult study with limited views even after administration of contrast. Left ventricular ejection fraction, by visual estimation, is 60 to 65%. The left ventricle has normal function. There is mildly increased left ventricular wall  thickness.  2. Right ventricle was not well visualized. On limited views appears mildly dilated with grossly normal systolic function  3. The mitral valve is normal in structure. No evidence of mitral valve regurgitation.  4. The tricuspid valve was grossly normal. Tricuspid valve regurgitation is not demonstrated.  5. The aortic valve was not well visualized. Aortic valve regurgitation is not visualized.  6. TR signal is inadequate for assessing pulmonary artery systolic pressure.  FINDINGS  Left Ventricle: Left ventricular ejection fraction, by visual estimation, is 60 to 65%. The left ventricle has normal function. There is mildly increased left ventricular wall thickness.  Right Ventricle: The right ventricular size is NWV. Right vetricular wall thickness was not assessed. Global RV systolic function is was not well visualized.  Left Atrium: Left atrial size was not well visualized.  Right Atrium: Right atrial size was not well visualized. Right atrial pressure is estimated at 3 mmHg.  Pericardium: Trivial pericardial effusion is present is seen. Trivial pericardial effusion is present.  Mitral Valve: The mitral valve is normal in structure. No evidence of mitral valve regurgitation.  Tricuspid Valve: The tricuspid valve is grossly normal. Tricuspid valve regurgitation is not demonstrated.  Aortic Valve: The aortic valve was not well visualized. Aortic valve regurgitation is not  visualized.  Pulmonic Valve: The pulmonic valve was not well visualized. Pulmonic valve regurgitation is not visualized by color flow Doppler. Pulmonic regurgitation is not visualized by color flow Doppler.  Aorta: The aortic root is normal in size and structure.  Shunts: The interatrial septum was not well visualized.    LEFT VENTRICLE          Normals PLAX 2D LVIDd:         5.20 cm  3.6 cm LVIDs:         3.00 cm  1.7 cm LV PW:         1.20 cm  1.4 cm LV IVS:        1.10 cm  1.3 cm LVOT diam:     2.10 cm  2.0 cm LV SV:         95 ml    79 ml LV SV Index:   34.99    45 ml/m2 LVOT Area:     3.46 cm 3.14 cm2    LEFT ATRIUM         Index  LA diam:    3.60 cm 1.41 cm/m    AORTA                 Normals Ao Root diam: 3.70 cm 31 mm    SHUNTS Systemic Diam: 2.10 cm  Antimicrobials:  Anti-infectives (From admission, onward)   Start     Dose/Rate Route Frequency Ordered Stop   11/01/19 1630  piperacillin-tazobactam (ZOSYN) IVPB 3.375 g  Status:  Discontinued     3.375 g 12.5 mL/hr over 240 Minutes Intravenous Every 8 hours 11/01/19 1609 11/03/19 1244   10/31/19 1000  remdesivir 100 mg in sodium chloride 0.9 % 100 mL IVPB  Status:  Discontinued     100 mg 200 mL/hr over 30 Minutes Intravenous Daily 10/30/19 0058 10/30/19 0154   10/31/19 1000  remdesivir 100 mg in sodium chloride 0.9 % 100 mL IVPB     100 mg 200 mL/hr over 30 Minutes Intravenous Every 24 hours 10/30/19 0837 11/03/19 1057   10/30/19 2200  remdesivir 100 mg in sodium chloride 0.9 % 100 mL IVPB  Status:  Discontinued     100 mg 200 mL/hr over 30 Minutes Intravenous Daily 10/29/19 2257 10/30/19 0835   10/30/19 0200  azithromycin (ZITHROMAX) 500 mg in sodium chloride 0.9 % 250 mL IVPB  Status:  Discontinued     500 mg 250 mL/hr over 60 Minutes Intravenous Every 24 hours 10/30/19 0058 10/31/19 1643   10/30/19 0100  remdesivir 200 mg in sodium chloride 0.9% 250 mL IVPB  Status:  Discontinued     200 mg 580  mL/hr over 30 Minutes Intravenous Once 10/30/19 0058 10/30/19 0404   10/29/19 2300  remdesivir 200 mg in sodium chloride 0.9% 250 mL IVPB  Status:  Discontinued     200 mg 580 mL/hr over 30 Minutes Intravenous Once 10/29/19 2257 10/30/19 0835     Subjective: Patient seen and examined at bedside he is sitting in the chair about to have a bath and he stated that he felt better yesterday.  No nausea or vomiting.  No longer requiring oxygen and does not feel short of breath.  Anxious about going to a skilled nursing facility but understands that he remains weak.  No lightheadedness or dizziness.  No other concerns or complaints at this time.  Objective: Vitals:   11/08/19 0600 11/08/19 0800 11/08/19 1300 11/08/19 1407  BP:    (!) 107/58  Pulse: 64 60 82 79  Resp: (!) 4 14 19    Temp:  (!) 97.4 F (36.3 C)  (!) 97.5 F (36.4 C)  TempSrc:  Oral  Oral  SpO2: 92% 91% 91% 94%  Weight:      Height:        Intake/Output Summary (Last 24 hours) at 11/08/2019 1523 Last data filed at 11/08/2019 1258 Gross per 24 hour  Intake 2209 ml  Output 2480 ml  Net -271 ml   Filed Weights   11/07/19 0458 11/08/19 0500 11/08/19 0534  Weight: 130.8 kg 130.5 kg 132 kg   Examination: Physical Exam:  Constitutional: WN/WD obese Caucasian male in NAD and appears anxious still Eyes: Lids and conjunctivae normal, sclerae anicteric  ENMT: External Ears, Nose appear normal. Grossly normal hearing. Mucous membranes are moist. Neck: Appears normal, supple, no cervical masses, normal ROM, no appreciable thyromegaly; no JVD Respiratory: Diminished to auscultation bilaterally with coarse breath sounds, no wheezing, rales, rhonchi or crackles. Normal respiratory effort and patient is not tachypenic. No accessory muscle use. Unlabored breathing  and weaned off of Supplemental O2 via [place] Cardiovascular: RRR, no murmurs / rubs / gallops. S1 and S2 auscultated. 1+ LE extremity edema. Abdomen: Soft, non-tender, Distended  2/2 body habitus. Bowel sounds positive x4.  GU: Deferred. Musculoskeletal: No clubbing / cyanosis of digits/nails. No joint deformity upper and lower extremities.   Skin: No rashes, lesions, ulcers on a limited skin evaluation but has chronic LE venous stasis changes. No induration; Warm and dry.  Neurologic: CN 2-12 grossly intact with no focal deficits. Romberg sign and cerebellar reflexes not assessed.  Psychiatric: Normal judgment and insight. Alert and oriented x 3. Still appears a little anxious. Other than that has a pleasant mood and appropriate affect.   Data Reviewed: I have personally reviewed following labs and imaging studies  CBC: Recent Labs  Lab 11/02/19 0036 11/02/19 0036 11/03/19 [id number] 11/03/19 [id number] 11/04/19 0419 11/05/19 0350 11/06/19 0224 11/07/19 0205 11/08/19 0518  WBC 4.7   < > 5.2   < > 6.3 4.9 5.8 6.3 7.5  NEUTROABS 4.0  --  4.2  --   --   --  4.2 4.6 5.9  HGB 12.5*   < > 13.0   < > 13.4 12.9* 13.2 12.2* 12.7*  HCT 39.2   < > 41.5   < > 41.6 39.9 41.6 37.5* 39.4  MCV 92.5   < > 93.5   < > 93.1 91.9 92.7 91.7 92.3  PLT 124*   < > 157   < > 150 150 176 171 164   < > = values in this interval not displayed.   Basic Metabolic Panel: Recent Labs  Lab 11/02/19 0036 11/02/19 0036 11/03/19 [id number] 11/03/19 [id number] 11/04/19 0419 11/05/19 0350 11/06/19 0224 11/07/19 0205 11/08/19 0518  NA 138   < > 139   < > 140 137 137 137 139  K 4.5   < > 4.4   < > 4.5 4.1 3.8 4.0 3.9  CL 96*   < > 94*   < > 96* 97* 92* 94* 94*  CO2 31   < > 33*   < > 36* 30 35* 31 35*  GLUCOSE 280*   < > 238*   < > 131* 152* 233* 183* 140*  BUN 50*   < > 48*   < > 51* 47* 51* 57* 50*  CREATININE 1.50*   < > 1.52*   < > 1.55* 1.36* 1.54* 1.40* 1.37*  CALCIUM 8.8*   < > 9.0   < > 9.2 9.2 9.0 9.2 9.2  MG 2.0   < > 1.9   < > 1.9 1.9 1.8 1.7 1.8  PHOS 3.3  --  3.8  --   --   --  3.3 3.5 3.9   < > = values in this interval not displayed.   GFR: Estimated Creatinine Clearance: 74.5 mL/min  (A) (by C-G formula based on SCr of 1.37 mg/dL (H)). Liver Function Tests: Recent Labs  Lab 11/05/19 0350 11/06/19 0224 11/07/19 0205 11/08/19 0518  AST 39 30 27 32  ALT 76* 70* 62* 70*  ALKPHOS 42 56 51 49  BILITOT 0.4 0.6 0.8 0.8  PROT 5.4* 5.4* 5.3* 5.5*  ALBUMIN 2.5* 2.7* 2.6* 2.8*   No results for input(s): LIPASE, AMYLASE in the last 168 hours. No results for input(s): AMMONIA in the last 168 hours. Coagulation Profile: No results for input(s): INR, PROTIME in the last 168 hours. Cardiac Enzymes: No results for input(s): CKTOTAL, CKMB, CKMBINDEX, TROPONINI in  the last 168 hours. BNP (last 3 results) No results for input(s): PROBNP in the last 8760 hours. HbA1C: No results for input(s): HGBA1C in the last 72 hours. CBG: Recent Labs  Lab 11/07/19 1926 11/08/19 0000 11/08/19 0442 11/08/19 0812 11/08/19 1136  GLUCAP 254* 201* 144* 105* 165*   Lipid Profile: No results for input(s): CHOL, HDL, LDLCALC, TRIG, CHOLHDL, LDLDIRECT in the last 72 hours. Thyroid Function Tests: No results for input(s): TSH, T4TOTAL, FREET4, T3FREE, THYROIDAB in the last 72 hours. Anemia Panel: Recent Labs    11/07/19 0205 11/08/19 0518  FERRITIN 156 155   Sepsis Labs: Recent Labs  Lab 11/01/19 1851 11/02/19 0042  LATICACIDVEN 2.4* 2.1*    Recent Results (from the past 240 hour(s))  Culture, blood (routine x 2)     Status: None   Collection Time: 11/01/19  7:00 PM   Specimen: BLOOD RIGHT HAND  Result Value Ref Range Status   Specimen Description BLOOD RIGHT HAND  Final   Special Requests   Final    BOTTLES DRAWN AEROBIC ONLY Blood Culture adequate volume   Culture   Final    NO GROWTH 5 DAYS Performed at [facility], [place] [address], [place], [place] [address]    Report Status 11/06/2019 FINAL  Final  Culture, blood (routine x 2)     Status: None   Collection Time: 11/01/19  7:10 PM   Specimen: BLOOD RIGHT HAND  Result Value Ref Range Status   Specimen  Description BLOOD RIGHT HAND  Final   Special Requests   Final    BOTTLES DRAWN AEROBIC ONLY Blood Culture adequate volume   Culture   Final    NO GROWTH 5 DAYS Performed at [facility], [place] [address], [place], [place] [address]    Report Status 11/06/2019 FINAL  Final    Radiology Studies: DG CHEST PORT 1 VIEW  Result Date: 11/08/2019 CLINICAL DATA:  69 year old male with history of shortness of breath. COVID-19 positive. EXAM: PORTABLE CHEST 1 VIEW COMPARISON:  Chest x-ray 11/07/2019. FINDINGS: Again noted are patchy multifocal ill-defined opacities and areas of interstitial prominence, most evident throughout the mid to lower lungs bilaterally (left greater than right). Overall, aeration appears similar to the prior examination, though findings have shifted. Specifically, but aeration appears slightly worsened in the lateral aspect of the left mid lung, but has improved slightly in the right mid to lower lung. No pleural effusions. No pneumothorax. Pulmonary vasculature is partially obscured, but there are no definitive findings to suggest pulmonary edema. Heart size is mildly enlarged. The patient is rotated to the right on today's exam, resulting in distortion of the mediastinal contours and reduced diagnostic sensitivity and specificity for mediastinal pathology. Aortic atherosclerosis. IMPRESSION: 1. Multilobar bilateral pneumonia redemonstrated, with roughly stable overall aeration compared to the prior study, as detailed above. 2. Mild cardiomegaly. 3. Aortic atherosclerosis. Electronically Signed   By: Vinnie Langton M.D.   On: 11/08/2019 10:10   DG CHEST PORT 1 VIEW  Result Date: 11/07/2019 CLINICAL DATA:  Shortness of breath. EXAM: PORTABLE CHEST 1 VIEW COMPARISON:  11/06/2019 FINDINGS: Lungs are adequately inflated with stable patchy airspace opacification over the mid to lower lungs likely infection. No effusion. Stable cardiomegaly. Remainder of the exam is unchanged.  IMPRESSION: Stable patchy airspace process over the mid to lower lungs likely multifocal infection. Stable cardiomegaly. Electronically Signed   By: Marin Olp M.D.   On: 11/07/2019 08:00    Scheduled Meds: .  amLODipine  2.5 mg Oral QHS  . vitamin C  500 mg Oral Daily  . aspirin  81 mg Oral QHS  . carvedilol  50 mg Oral BID  . clopidogrel  75 mg Oral Daily  . enoxaparin (LOVENOX) injection  0.5 mg/kg Subcutaneous Q24H  . famotidine  20 mg Oral QHS  . fluticasone  2 spray Each Nare TID  . folic acid  1 mg Oral Daily  . furosemide  40 mg Oral BID  . icosapent Ethyl  2 g Oral BID  . insulin aspart  0-20 Units Subcutaneous Q4H  . insulin aspart  20 Units Subcutaneous TID WC  . insulin detemir  50 Units Subcutaneous BID  . Ipratropium-Albuterol  1 puff Inhalation Q6H  . multivitamin with minerals  1 tablet Oral Daily  . oxymetazoline  1 spray Each Nare BID  . pregabalin  50 mg Oral BID  . QUEtiapine  200 mg Oral QHS  . rosuvastatin  20 mg Oral Daily  . sodium chloride flush  3 mL Intravenous Q12H  . Vitamin D (Ergocalciferol)  50,000 Units Oral Q Mon  . zinc sulfate  220 mg Oral Daily   Continuous Infusions: . sodium chloride      LOS: 10 days   Kerney Elbe, DO [facility] PAGER is on AMION  If 7PM-7AM, please contact night-coverage [email or web address]

## 2019-11-09 LAB — CBC WITH DIFFERENTIAL/PLATELET
Abs Immature Granulocytes: 0.48 10*3/uL — ABNORMAL HIGH (ref 0.00–0.07)
Basophils Absolute: 0 10*3/uL (ref 0.0–0.1)
Basophils Relative: 0 %
Eosinophils Absolute: 0.2 10*3/uL (ref 0.0–0.5)
Eosinophils Relative: 2 %
HCT: 37.8 % — ABNORMAL LOW (ref 39.0–52.0)
Hemoglobin: 12.4 g/dL — ABNORMAL LOW (ref 13.0–17.0)
Immature Granulocytes: 6 %
Lymphocytes Relative: 12 %
Lymphs Abs: 0.9 10*3/uL (ref 0.7–4.0)
MCH: 30 pg (ref 26.0–34.0)
MCHC: 32.8 g/dL (ref 30.0–36.0)
MCV: 91.3 fL (ref 80.0–100.0)
Monocytes Absolute: 0.3 10*3/uL (ref 0.1–1.0)
Monocytes Relative: 4 %
Neutro Abs: 5.9 10*3/uL (ref 1.7–7.7)
Neutrophils Relative %: 76 %
Platelets: 170 10*3/uL (ref 150–400)
RBC: 4.14 MIL/uL — ABNORMAL LOW (ref 4.22–5.81)
RDW: 16.6 % — ABNORMAL HIGH (ref 11.5–15.5)
WBC: 7.8 10*3/uL (ref 4.0–10.5)
nRBC: 0.6 % — ABNORMAL HIGH (ref 0.0–0.2)

## 2019-11-09 LAB — GLUCOSE, CAPILLARY
Glucose-Capillary: 127 mg/dL — ABNORMAL HIGH (ref 70–99)
Glucose-Capillary: 145 mg/dL — ABNORMAL HIGH (ref 70–99)
Glucose-Capillary: 194 mg/dL — ABNORMAL HIGH (ref 70–99)
Glucose-Capillary: 244 mg/dL — ABNORMAL HIGH (ref 70–99)
Glucose-Capillary: 69 mg/dL — ABNORMAL LOW (ref 70–99)
Glucose-Capillary: 74 mg/dL (ref 70–99)
Glucose-Capillary: 78 mg/dL (ref 70–99)
Glucose-Capillary: 79 mg/dL (ref 70–99)
Glucose-Capillary: 81 mg/dL (ref 70–99)
Glucose-Capillary: 90 mg/dL (ref 70–99)

## 2019-11-09 LAB — COMPREHENSIVE METABOLIC PANEL
ALT: 66 U/L — ABNORMAL HIGH (ref 0–44)
AST: 32 U/L (ref 15–41)
Albumin: 2.8 g/dL — ABNORMAL LOW (ref 3.5–5.0)
Alkaline Phosphatase: 48 U/L (ref 38–126)
Anion gap: 12 (ref 5–15)
BUN: 53 mg/dL — ABNORMAL HIGH (ref 8–23)
CO2: 34 mmol/L — ABNORMAL HIGH (ref 22–32)
Calcium: 9.3 mg/dL (ref 8.9–10.3)
Chloride: 94 mmol/L — ABNORMAL LOW (ref 98–111)
Creatinine, Ser: 1.49 mg/dL — ABNORMAL HIGH (ref 0.61–1.24)
GFR calc Af Amer: 55 mL/min — ABNORMAL LOW (ref >60)
GFR calc non Af Amer: 48 mL/min — ABNORMAL LOW (ref >60)
Glucose, Bld: 75 mg/dL (ref 70–99)
Potassium: 3.3 mmol/L — ABNORMAL LOW (ref 3.5–5.1)
Sodium: 140 mmol/L (ref 135–145)
Total Bilirubin: 0.9 mg/dL (ref 0.3–1.2)
Total Protein: 5.3 g/dL — ABNORMAL LOW (ref 6.5–8.1)

## 2019-11-09 LAB — C-REACTIVE PROTEIN: CRP: 0.8 mg/dL (ref <1.0)

## 2019-11-09 LAB — LACTATE DEHYDROGENASE: LDH: 287 U/L — ABNORMAL HIGH (ref 98–192)

## 2019-11-09 LAB — MAGNESIUM: Magnesium: 1.8 mg/dL (ref 1.7–2.4)

## 2019-11-09 LAB — PHOSPHORUS: Phosphorus: 4.4 mg/dL (ref 2.5–4.6)

## 2019-11-09 LAB — D-DIMER, QUANTITATIVE: D-Dimer, Quant: 0.28 ug/mL-FEU (ref 0.00–0.50)

## 2019-11-09 LAB — FERRITIN: Ferritin: 134 ng/mL (ref 24–336)

## 2019-11-09 MED ORDER — INSULIN ASPART 100 UNIT/ML ~~LOC~~ SOLN
16.0000 [IU] | Freq: Three times a day (TID) | SUBCUTANEOUS | Status: DC
  Administered 2019-11-09 (×2): 16 [IU] via SUBCUTANEOUS

## 2019-11-09 MED ORDER — POTASSIUM CHLORIDE CRYS ER 20 MEQ PO TBCR
40.0000 meq | EXTENDED_RELEASE_TABLET | Freq: Two times a day (BID) | ORAL | Status: AC
  Administered 2019-11-09 (×2): 40 meq via ORAL
  Filled 2019-11-09 (×2): qty 2

## 2019-11-09 MED ORDER — INSULIN DETEMIR 100 UNIT/ML ~~LOC~~ SOLN
40.0000 [IU] | Freq: Two times a day (BID) | SUBCUTANEOUS | Status: DC
  Administered 2019-11-09 (×2): 40 [IU] via SUBCUTANEOUS
  Filled 2019-11-09 (×4): qty 0.4

## 2019-11-09 NOTE — Progress Notes (Signed)
PROGRESS NOTE    Reginald Duncan  [id number] DOB: [birth year] DOA: 10/29/2019 PCP: [name], PA   Brief Narrative:  This is a 69 year old male, history of obesity, CAD s/p DES 2014, COPD, OSA, CKD 3a, chronic pain, PVD, diabetes, hypertension, CVA, hyperlipidemia, anxiety who presented with worsening shortness of breath times several weeks and new onset chest pain with associated wheezing and fever found to be Covid positive, febrile and tachycardic in ED as well as hypoxic requiring nasal cannula O2 and started on Decadron and remdesivir.  CRP noted to be elevated and patient started on Actemra x1 after risk/benefit discussion with patient on 1/7.  Also started on Ativan 1 mg as needed twice daily for severe anxiety.  Patient was offered to be transferred to [facility] however stated he wished to stay at [facility] for further treatment. Does not wish to be transferred to [facility].  1/9: Patient continued to have decline in respiratory status despite Actemra x1 and 4 days remdesivir and Decadron.  Pulmonology consulted.  Continued to have chest pain intermittently and states that he had chest pain the night before last but it is improved now. Troponin was flat  Does not feel like it is musculoskeletal.  Will check troponins and EKG and oxygen requirement weaning slowly and was down to 3 L but had an acute nose bleed and had to be placed on 6 Liters.  Only he has been weaned off of all supplemental oxygen via nasal cannula and his inflammatory markers have improved significantly.  PT OT recommending skilled nursing facility and patient hesitant to go to skilled nursing facility but is agreeable and have asked social work to do assist with placement.  Anticipate discharging to SNF next 24 to 48 hours however social worker is not able to place the patient in SNF is unable to get in touch with the patient's insurance for insurance authorization  Assessment & Plan:   Active Problems:   Acute on  chronic respiratory failure (HCC)  Acute hypoxic respiratory failure secondary to COVID-19 with volume overload, improving and was able to be weaned to room air at rest -Continues to have symptomatic improvement -CTA Chest: negative for PE, with multifocal pneumonia likely viral or atypical etiology -O2 requirement 12 L/min-> 10 L/min HFNC -> 3 Liters and now 6 Liters after Nose Bleed and is back on 3 Liters; Had another nosebleed yesterday but is now on room air -Started Oxymetazoline for his Nairs  -Repeat CXR on 11/06/2019 showed: "Persistent bilateral airspace lung opacities consistent with multifocal COVID-19 pneumonia. No new lung abnormalities" -CXR on 11/08/2019 showed: "Multilobar bilateral pneumonia redemonstrated, with roughly stable overall aeration compared to the prior study, as detailed above. Mild cardiomegaly. Aortic atherosclerosis."  -Pulmonology consulted 1/9 without much to add -Status post Actemra 1/7 x 1 -Completed 5 days Remdesivir  -Completed dexamethasone dosing for 10 days -Trend Inflammatory Markers Daily and have trended down  Recent Labs    11/07/19 0205 11/08/19 0518 11/09/19 0311  DDIMER 0.50 0.40 0.28  FERRITIN 156 155 134  LDH 349* 297* 287*  CRP 1.0* 0.8 0.8   Lab Results  Component Value Date   SARSCOV2NAA NEGATIVE 10/09/2019   SARSCOV2NAA NEGATIVE 09/25/2019   [place] NEGATIVE 09/15/2019  SpO2: 93 % O2 Flow Rate (L/min): 1 L/min -Diuresed well with IV Lasix but developed AKI which is improved; Resumed Home lasix at 40 mg po BID and Renal Fxn slightly elevated again but is now improved and trending down -Strict I's  and O's  -C/w Antitussives with Guaifenesin-Dextromethorphan 10 mL po q4hprn Cough and Chlorpheniramine-Hydrocodone 5 mL po q12hprn -Check Blood Cx x2 and showed NGTD at 5 Days -C/w Zinc 220 mg po Daily and Vitamin C  -Airborne and Contact Precautions -C/w Combivent Scheduled 1 puff IH q6h and Albuterol 2 puff IH q6hprn -Add  Flutter Valve and Incentive Spirometry; Added Afrin Spray because of Nose bleed and Humidified Air  -Continue to Monitor and Trend Respiratory Status and repeat CXR and Inflammatory Markers in the AM -Ambulatory home O2 screen done and showed the patient needs at least 5 L of supplemental oxygen via nasal cannula while ambulating to maintain his saturations -PT/OT Recommending SNF and anticipating discharge in next 24 to 48 hours if able to get in touch with the patient's insurance company  Volume Overload -On PO lasix at home -Limited echo with improved EF from prior and is now 60-65% -Good diuresis with IV Lasix, net -13.751 L since admit. Held IV Lasix 2/2 to AKI but resumed po Lasix and Renal Fxn is fluctuating but stable -Strict I/O and Daily weights;  -Continue to Monitor for S/Sx of Volume Overload   Viral Sepsis secondary to COVID 19 with initial concern for underlying HAP -Previously hypothermic, qSOFA: 2 (RR 31, SBP 90) - high risk, CT chest with multifocal pna, no PE -Sepsis has since resolved -MRSA nares negative -No secondary pneumonia per pulmonology on CT scan, off Zosyn and monitor.  -Check sputum culture if fever -Repeat CXR as above   Lethargy, likely multifactorial: polypharmacy (seroquel/lyrica/xanax), hypoxia -Improved with med rec -Patient is severely deconditioned and will need to continue physical therapy and Occupational Therapy as well as ambulate in the halls  COPD, not in exacerbation -Continue IV steroids and inhalers -Pulm Consulted: Combivent inhaler with technique/strength assessment at bedside by RT Stanton Kidney recommends outpatient pulmonary follow-up to assure optimize pulmonary treatment and will need official PFTs  Severe Anxiety over Healthcare -On xanax per outpatient regimen -Continue decreased seroquel 200 mg QHS  -Needs Reassurance and anticipating D/C to SNF soon   Neuropathy -Continue to decrease Lyrica due to lethargy; Now on 50 mg po BID    Thrombocytopenia, likely reactive -Resolved; his platelet count is now 150,000  Hypoglycemia, blood sugars on the lower side today and insulin regimen has been adjusted -Resolved with D5W, now hyperglycemic in setting of steroids -CBGs have been now ranging from 105-165  Uncontrolled Diabetes with Hyperglycemia  -HbA1C 10.0 -Insulin regimen has been adjusted and have decreased Levemir to 40 mg subcu twice daily, NovoLog subcu 3 times daily with meals to 16 units, and will continue resistant NovoLog sliding scale insulin every 4h  -CBG's ranging rom 69-127  OSA -CPAP on hold due to COVID 19 protocol  AKI on CKD 3a  -Creatinine was 1.55, baseline 1.2-1.3 and had improved but worsened again. BUN/Cr was now 53/1.49 -Continue Holding Lisinopril for now -Resumed Lasix yesterday and may just go to once a day if Renal Fxn continues to worsen but since it is stable -Nephrotoxic medications, contrast dyes, hypotension and renally adjust medications-continue monitor and trend renal function  -repeat CMP in a.m.  Chest pain: More likely MSK and anxiety, improved  -Well Score: 0, low risk, PERC: cannot rule out PE, D Dimer negative -CTA without PE, with multifocal pneumonia -Cycle HS Troponin and check EKG; Troponin Negative and EKG never repeated when ordered  -Does not have reproducible pain on palpation -Will get Cardiology opinion given history of CAD once Troponin and  EKG are resulted   Hypertension -Continue Amlodipine 2.5 mg po qHS and Carvedilol 50 mg po BID   CAD s/p DES 2014 -Status post left heart cath 09/01/2019 and showed "Multivessel coronary artery disease with 50-60% mid LAD stenosis and chronic total occlusion of the distal LAD, as well as 70% D2 lesion and sequential 30-40% proximal and 50-60% distal RCA stenoses. Ostial/proximal LAD stent is widely patent. Mildly to moderately left ventricular filling pressure (LVEDP ~24 mmHg)."  -Continue dual antiplatelets with ASA 81 mg  po Daily, Clopidogrel 75 mg po Daily, Carvedilol 50 mg po BID and Rosuvastatin 20 mg po daily  -Check HS Troponin and EKG given CP -May Discuss with Cardiology and get formal consultation after EKG and Troponins are checked; Troponin Negative but EKG never done. So will order again   Obesity -Estimated body mass index is 38.77 kg/m as calculated from the following:   Height as of this encounter: 6\' 1"  (1.854 m).   Weight as of this encounter: 133.3 kg. -Weight Loss and Dietary Counseling given   Elevated ALT  -In the setting of COVID-19 disease  -Patient's ALT is has been elevated since January 6 the least and has trended up to 76 and currently is now trending back down to 66 -Continue to monitor and trend and repeat CMP in a.m.  Hypokalemia -Patient's potassium this morning was 3.3  -Replete with p.o. potassium chloride 40 mg twice daily x2 doses  -Continue to monitor and replete as necessary -Repeat CMP in a.m.  Normocytic Anemia -Patient's hemoglobin/hematocrit is now 12.4/37.8 -Has been relatively stable last week or so -Check anemia panel in the a.m. -Continue to monitor for signs or symptoms of bleeding; currently no overt bleeding noted -Repeat CBC in a.m.  DVT prophylaxis: Enoxaparin 65 mg sq q24h Code Status: FULL CODE  Family Communication: No family present at bedside  Disposition Plan: SNF when medically stable   Consultants:   PCCM/Pulmonary  Discussed with Cardiology Dr. Gwenlyn Found    Procedures:  ECHOCARDIOGRAM IMPRESSIONS    1. Technically difficult study with limited views even after administration of contrast. Left ventricular ejection fraction, by visual estimation, is 60 to 65%. The left ventricle has normal function. There is mildly increased left ventricular wall  thickness.  2. Right ventricle was not well visualized. On limited views appears mildly dilated with grossly normal systolic function  3. The mitral valve is normal in structure. No  evidence of mitral valve regurgitation.  4. The tricuspid valve was grossly normal. Tricuspid valve regurgitation is not demonstrated.  5. The aortic valve was not well visualized. Aortic valve regurgitation is not visualized.  6. TR signal is inadequate for assessing pulmonary artery systolic pressure.  FINDINGS  Left Ventricle: Left ventricular ejection fraction, by visual estimation, is 60 to 65%. The left ventricle has normal function. There is mildly increased left ventricular wall thickness.  Right Ventricle: The right ventricular size is NWV. Right vetricular wall thickness was not assessed. Global RV systolic function is was not well visualized.  Left Atrium: Left atrial size was not well visualized.  Right Atrium: Right atrial size was not well visualized. Right atrial pressure is estimated at 3 mmHg.  Pericardium: Trivial pericardial effusion is present is seen. Trivial pericardial effusion is present.  Mitral Valve: The mitral valve is normal in structure. No evidence of mitral valve regurgitation.  Tricuspid Valve: The tricuspid valve is grossly normal. Tricuspid valve regurgitation is not demonstrated.  Aortic Valve: The aortic valve was not well  visualized. Aortic valve regurgitation is not visualized.  Pulmonic Valve: The pulmonic valve was not well visualized. Pulmonic valve regurgitation is not visualized by color flow Doppler. Pulmonic regurgitation is not visualized by color flow Doppler.  Aorta: The aortic root is normal in size and structure.  Shunts: The interatrial septum was not well visualized.    LEFT VENTRICLE          Normals PLAX 2D LVIDd:         5.20 cm  3.6 cm LVIDs:         3.00 cm  1.7 cm LV PW:         1.20 cm  1.4 cm LV IVS:        1.10 cm  1.3 cm LVOT diam:     2.10 cm  2.0 cm LV SV:         95 ml    79 ml LV SV Index:   34.99    45 ml/m2 LVOT Area:     3.46 cm 3.14 cm2    LEFT ATRIUM         Index LA diam:    3.60 cm 1.41  cm/m    AORTA                 Normals Ao Root diam: 3.70 cm 31 mm    SHUNTS Systemic Diam: 2.10 cm  Antimicrobials:  Anti-infectives (From admission, onward)   Start     Dose/Rate Route Frequency Ordered Stop   11/01/19 1630  piperacillin-tazobactam (ZOSYN) IVPB 3.375 g  Status:  Discontinued     3.375 g 12.5 mL/hr over 240 Minutes Intravenous Every 8 hours 11/01/19 1609 11/03/19 1244   10/31/19 1000  remdesivir 100 mg in sodium chloride 0.9 % 100 mL IVPB  Status:  Discontinued     100 mg 200 mL/hr over 30 Minutes Intravenous Daily 10/30/19 0058 10/30/19 0154   10/31/19 1000  remdesivir 100 mg in sodium chloride 0.9 % 100 mL IVPB     100 mg 200 mL/hr over 30 Minutes Intravenous Every 24 hours 10/30/19 0837 11/03/19 1057   10/30/19 2200  remdesivir 100 mg in sodium chloride 0.9 % 100 mL IVPB  Status:  Discontinued     100 mg 200 mL/hr over 30 Minutes Intravenous Daily 10/29/19 2257 10/30/19 0835   10/30/19 0200  azithromycin (ZITHROMAX) 500 mg in sodium chloride 0.9 % 250 mL IVPB  Status:  Discontinued     500 mg 250 mL/hr over 60 Minutes Intravenous Every 24 hours 10/30/19 0058 10/31/19 1643   10/30/19 0100  remdesivir 200 mg in sodium chloride 0.9% 250 mL IVPB  Status:  Discontinued     200 mg 580 mL/hr over 30 Minutes Intravenous Once 10/30/19 0058 10/30/19 0404   10/29/19 2300  remdesivir 200 mg in sodium chloride 0.9% 250 mL IVPB  Status:  Discontinued     200 mg 580 mL/hr over 30 Minutes Intravenous Once 10/29/19 2257 10/30/19 0835     Subjective: Patient seen and examined at bedside and he was sitting in the chair back on oxygen but had ambulated without oxygen and desaturated.  Pulmonary respiratory screen done and he required at least 5 L.  At rest he does not require any supplemental oxygen.  Nursing told me that he has supplemental oxygen at home and uses it as needed.  No nausea or vomiting.  Still nervous about going to SNF.  No other concerns or complaints at this  time.  Objective: Vitals:   11/09/19 0420 11/09/19 0421 11/09/19 0446 11/09/19 0952  BP: 117/76     Pulse: (!) 59     Resp: 13     Temp:  97.9 F (36.6 C)    TempSrc:  Axillary    SpO2: 92%   93%  Weight:   133.3 kg   Height:        Intake/Output Summary (Last 24 hours) at 11/09/2019 1520 Last data filed at 11/09/2019 1200 Gross per 24 hour  Intake 1849 ml  Output 3300 ml  Net -1451 ml   Filed Weights   11/08/19 0500 11/08/19 0534 11/09/19 0446  Weight: 130.5 kg 132 kg 133.3 kg   Examination: Physical Exam:  Constitutional: WN/WD obese Caucasian male currently in no acute distress appears anxious Eyes: Lids and conjunctivae normal, sclerae anicteric  ENMT: External Ears, Nose appear normal. Grossly normal hearing. Mucous membranes are moist.   Neck: Appears normal, supple, no cervical masses, normal ROM, no appreciable thyromegaly; no JVD Respiratory: Minutes to auscultation bilaterally with coarse breath sounds but no appreciable wheezing, rhonchi or crackles.  Has a normal respiratory effort and is back on 3 L of supplemental oxygen via nasal cannula after he just ambulated Cardiovascular: RRR, no murmurs / rubs / gallops. S1 and S2 auscultated.  1+ extremity edema. 2+ pedal pulses. No carotid bruits.  Abdomen: Soft, non-tender, Distended secondary body habitus. Bowel sounds positive x4.  GU: Deferred. Musculoskeletal: No clubbing / cyanosis of digits/nails.  Normal strength and muscle tone.  Skin: No rashes, lesions, ulcers on limited skin evaluation lower extremity venous stasis changes. No induration; Warm and dry.  Neurologic: CN 2-12 grossly intact with no focal deficits. Romberg sign and cerebellar reflexes not assessed.   Psychiatric: Normal judgment and insight. Alert and oriented x 3.  Still appears a little anxious about discharge  Data Reviewed: I have personally reviewed following labs and imaging studies  CBC: Recent Labs  Lab 11/03/19 0646 11/04/19 0419  11/05/19 0350 11/06/19 0224 11/07/19 0205 11/08/19 0518 11/09/19 0311  WBC 5.2   < > 4.9 5.8 6.3 7.5 7.8  NEUTROABS 4.2  --   --  4.2 4.6 5.9 5.9  HGB 13.0   < > 12.9* 13.2 12.2* 12.7* 12.4*  HCT 41.5   < > 39.9 41.6 37.5* 39.4 37.8*  MCV 93.5   < > 91.9 92.7 91.7 92.3 91.3  PLT 157   < > 150 176 171 164 170   < > = values in this interval not displayed.   Basic Metabolic Panel: Recent Labs  Lab 11/03/19 0646 11/04/19 0419 11/05/19 0350 11/06/19 0224 11/07/19 0205 11/08/19 0518 11/09/19 0311  NA 139   < > 137 137 137 139 140  K 4.4   < > 4.1 3.8 4.0 3.9 3.3*  CL 94*   < > 97* 92* 94* 94* 94*  CO2 33*   < > 30 35* 31 35* 34*  GLUCOSE 238*   < > 152* 233* 183* 140* 75  BUN 48*   < > 47* 51* 57* 50* 53*  CREATININE 1.52*   < > 1.36* 1.54* 1.40* 1.37* 1.49*  CALCIUM 9.0   < > 9.2 9.0 9.2 9.2 9.3  MG 1.9   < > 1.9 1.8 1.7 1.8 1.8  PHOS 3.8  --   --  3.3 3.5 3.9 4.4   < > = values in this interval not displayed.   GFR: Estimated Creatinine Clearance: 68.9 mL/min (A) (by  C-G formula based on SCr of 1.49 mg/dL (H)). Liver Function Tests: Recent Labs  Lab 11/05/19 0350 11/06/19 0224 11/07/19 0205 11/08/19 0518 11/09/19 0311  AST 39 30 27 32 32  ALT 76* 70* 62* 70* 66*  ALKPHOS 42 56 51 49 48  BILITOT 0.4 0.6 0.8 0.8 0.9  PROT 5.4* 5.4* 5.3* 5.5* 5.3*  ALBUMIN 2.5* 2.7* 2.6* 2.8* 2.8*   No results for input(s): LIPASE, AMYLASE in the last 168 hours. No results for input(s): AMMONIA in the last 168 hours. Coagulation Profile: No results for input(s): INR, PROTIME in the last 168 hours. Cardiac Enzymes: No results for input(s): CKTOTAL, CKMB, CKMBINDEX, TROPONINI in the last 168 hours. BNP (last 3 results) No results for input(s): PROBNP in the last 8760 hours. HbA1C: No results for input(s): HGBA1C in the last 72 hours. CBG: Recent Labs  Lab 11/09/19 0422 11/09/19 0507 11/09/19 0825 11/09/19 1248 11/09/19 1325  GLUCAP 69* 79 127* 90 78   Lipid  Profile: No results for input(s): CHOL, HDL, LDLCALC, TRIG, CHOLHDL, LDLDIRECT in the last 72 hours. Thyroid Function Tests: No results for input(s): TSH, T4TOTAL, FREET4, T3FREE, THYROIDAB in the last 72 hours. Anemia Panel: Recent Labs    11/08/19 0518 11/09/19 0311  FERRITIN 155 134   Sepsis Labs: No results for input(s): PROCALCITON, LATICACIDVEN in the last 168 hours.  Recent Results (from the past 240 hour(s))  Culture, blood (routine x 2)     Status: None   Collection Time: 11/01/19  7:00 PM   Specimen: BLOOD RIGHT HAND  Result Value Ref Range Status   Specimen Description BLOOD RIGHT HAND  Final   Special Requests   Final    BOTTLES DRAWN AEROBIC ONLY Blood Culture adequate volume   Culture   Final    NO GROWTH 5 DAYS Performed at [facility], [address], [place], [place] [address]    Report Status 11/06/2019 FINAL  Final  Culture, blood (routine x 2)     Status: None   Collection Time: 11/01/19  7:10 PM   Specimen: BLOOD RIGHT HAND  Result Value Ref Range Status   Specimen Description BLOOD RIGHT HAND  Final   Special Requests   Final    BOTTLES DRAWN AEROBIC ONLY Blood Culture adequate volume   Culture   Final    NO GROWTH 5 DAYS Performed at [facility], [place] [address], [place], [place] [address]    Report Status 11/06/2019 FINAL  Final    Radiology Studies: DG CHEST PORT 1 VIEW  Result Date: 11/08/2019 CLINICAL DATA:  69 year old male with history of shortness of breath. COVID-19 positive. EXAM: PORTABLE CHEST 1 VIEW COMPARISON:  Chest x-ray 11/07/2019. FINDINGS: Again noted are patchy multifocal ill-defined opacities and areas of interstitial prominence, most evident throughout the mid to lower lungs bilaterally (left greater than right). Overall, aeration appears similar to the prior examination, though findings have shifted. Specifically, but aeration appears slightly worsened in the lateral aspect of the left mid lung, but has improved  slightly in the right mid to lower lung. No pleural effusions. No pneumothorax. Pulmonary vasculature is partially obscured, but there are no definitive findings to suggest pulmonary edema. Heart size is mildly enlarged. The patient is rotated to the right on today's exam, resulting in distortion of the mediastinal contours and reduced diagnostic sensitivity and specificity for mediastinal pathology. Aortic atherosclerosis. IMPRESSION: 1. Multilobar bilateral pneumonia redemonstrated, with roughly stable overall aeration compared to the prior study,  as detailed above. 2. Mild cardiomegaly. 3. Aortic atherosclerosis. Electronically Signed   By: Vinnie Langton M.D.   On: 11/08/2019 10:10    Scheduled Meds:  amLODipine  2.5 mg Oral QHS   vitamin C  500 mg Oral Daily   aspirin  81 mg Oral QHS   carvedilol  50 mg Oral BID   clopidogrel  75 mg Oral Daily   enoxaparin (LOVENOX) injection  0.5 mg/kg Subcutaneous Q24H   famotidine  20 mg Oral QHS   fluticasone  2 spray Each Nare TID   folic acid  1 mg Oral Daily   furosemide  40 mg Oral BID   icosapent Ethyl  2 g Oral BID   insulin aspart  0-20 Units Subcutaneous Q4H   insulin aspart  16 Units Subcutaneous TID WC   insulin detemir  40 Units Subcutaneous BID   multivitamin with minerals  1 tablet Oral Daily   oxymetazoline  1 spray Each Nare BID   potassium chloride  40 mEq Oral BID   pregabalin  50 mg Oral BID   QUEtiapine  200 mg Oral QHS   rosuvastatin  20 mg Oral Daily   sodium chloride flush  3 mL Intravenous Q12H   Vitamin D (Ergocalciferol)  50,000 Units Oral Q Mon   zinc sulfate  220 mg Oral Daily   Continuous Infusions:  sodium chloride      LOS: 11 days   Kerney Elbe, DO [facility] PAGER is on AMION  If 7PM-7AM, please contact night-coverage [email or web address]

## 2019-11-09 NOTE — Progress Notes (Signed)
SATURATION QUALIFICATIONS: (This note is used to comply with regulatory documentation for home oxygen)  Patient Saturations on Room Air at Rest = 90%  Patient Saturations on Room Air while Ambulating = 72%  Patient Saturations on 5 Liters of oxygen while Ambulating = 92%; on 3 liters=78%  Please briefly explain why patient needs home oxygen: very short of breath when up and not on oxygen

## 2019-11-09 NOTE — Progress Notes (Signed)
Patient's Cbg was 69 this morning. Given some juice and snacks recheck cbg was 79. Will continue monitor.

## 2019-11-09 NOTE — TOC Progression Note (Signed)
Transition of Care [facility]) - Progression Note    Patient Details  Name: Reginald Duncan MRN: [id number] Date of Birth: 1951-08-09  Transition of Care [facility]) CM/SW [place], [place] Phone Number: 11/09/2019, 2:27 PM  Clinical Narrative:   CSW reached out to Admissions at [facility], asked if they were able to accept today. [place] still unable to reach [place] to [employer], and can't get anyone with it being a weekend. Will try again tomorrow. CSW called patient to update him, and updated MD on barriers to discharge. CSW to follow.    Expected Discharge Plan: [place] Barriers to Discharge: Continued Medical Work up  Expected Discharge Plan and Services Expected Discharge Plan: [place] arrangements for the past 2 months: Single Family Home Expected Discharge Date: 11/04/19                                     Social Determinants of Health (SDOH) Interventions    Readmission Risk Interventions Readmission Risk Prevention Plan 10/31/2019 09/27/2019  Transportation Screening Complete Complete  PCP or Specialist Appt within 3-5 Days - Complete  HRI or [place] - Complete  Social Work Consult for [place] Planning/Counseling - Complete  Palliative Care Screening - Not Applicable  Medication Review [employer] photographer) Complete Complete  PCP or Specialist appointment within 3-5 days of discharge Complete -  [place] or Home Care Consult Complete -  Some recent data might be hidden

## 2019-11-10 ENCOUNTER — Ambulatory Visit: Payer: Medicare HMO | Admitting: Internal Medicine

## 2019-11-10 DIAGNOSIS — I119 Hypertensive heart disease without heart failure: Secondary | ICD-10-CM | POA: Diagnosis not present

## 2019-11-10 DIAGNOSIS — E1140 Type 2 diabetes mellitus with diabetic neuropathy, unspecified: Secondary | ICD-10-CM | POA: Diagnosis not present

## 2019-11-10 DIAGNOSIS — E785 Hyperlipidemia, unspecified: Secondary | ICD-10-CM | POA: Diagnosis not present

## 2019-11-10 DIAGNOSIS — R079 Chest pain, unspecified: Secondary | ICD-10-CM | POA: Diagnosis not present

## 2019-11-10 DIAGNOSIS — Z7401 Bed confinement status: Secondary | ICD-10-CM | POA: Diagnosis not present

## 2019-11-10 DIAGNOSIS — E119 Type 2 diabetes mellitus without complications: Secondary | ICD-10-CM | POA: Diagnosis not present

## 2019-11-10 DIAGNOSIS — I639 Cerebral infarction, unspecified: Secondary | ICD-10-CM | POA: Diagnosis not present

## 2019-11-10 DIAGNOSIS — N1831 Chronic kidney disease, stage 3a: Secondary | ICD-10-CM | POA: Diagnosis not present

## 2019-11-10 DIAGNOSIS — J1282 Pneumonia due to coronavirus disease 2019: Secondary | ICD-10-CM | POA: Diagnosis not present

## 2019-11-10 DIAGNOSIS — F419 Anxiety disorder, unspecified: Secondary | ICD-10-CM | POA: Diagnosis not present

## 2019-11-10 DIAGNOSIS — J069 Acute upper respiratory infection, unspecified: Secondary | ICD-10-CM | POA: Diagnosis not present

## 2019-11-10 DIAGNOSIS — I2510 Atherosclerotic heart disease of native coronary artery without angina pectoris: Secondary | ICD-10-CM | POA: Diagnosis not present

## 2019-11-10 DIAGNOSIS — R0902 Hypoxemia: Secondary | ICD-10-CM | POA: Diagnosis not present

## 2019-11-10 DIAGNOSIS — U071 COVID-19: Secondary | ICD-10-CM | POA: Diagnosis not present

## 2019-11-10 DIAGNOSIS — I739 Peripheral vascular disease, unspecified: Secondary | ICD-10-CM | POA: Diagnosis not present

## 2019-11-10 DIAGNOSIS — R531 Weakness: Secondary | ICD-10-CM | POA: Diagnosis not present

## 2019-11-10 DIAGNOSIS — G4733 Obstructive sleep apnea (adult) (pediatric): Secondary | ICD-10-CM | POA: Diagnosis not present

## 2019-11-10 DIAGNOSIS — M2550 Pain in unspecified joint: Secondary | ICD-10-CM | POA: Diagnosis not present

## 2019-11-10 DIAGNOSIS — J9621 Acute and chronic respiratory failure with hypoxia: Secondary | ICD-10-CM | POA: Diagnosis not present

## 2019-11-10 DIAGNOSIS — J449 Chronic obstructive pulmonary disease, unspecified: Secondary | ICD-10-CM | POA: Diagnosis not present

## 2019-11-10 DIAGNOSIS — J9601 Acute respiratory failure with hypoxia: Secondary | ICD-10-CM | POA: Diagnosis not present

## 2019-11-10 DIAGNOSIS — I10 Essential (primary) hypertension: Secondary | ICD-10-CM | POA: Diagnosis not present

## 2019-11-10 LAB — IRON AND TIBC
Iron: 114 ug/dL (ref 45–182)
Saturation Ratios: 28 % (ref 17.9–39.5)
TIBC: 412 ug/dL (ref 250–450)
UIBC: 298 ug/dL

## 2019-11-10 LAB — CBC WITH DIFFERENTIAL/PLATELET
Abs Immature Granulocytes: 0.34 10*3/uL — ABNORMAL HIGH (ref 0.00–0.07)
Basophils Absolute: 0 10*3/uL (ref 0.0–0.1)
Basophils Relative: 0 %
Eosinophils Absolute: 0.3 10*3/uL (ref 0.0–0.5)
Eosinophils Relative: 4 %
HCT: 39.2 % (ref 39.0–52.0)
Hemoglobin: 13 g/dL (ref 13.0–17.0)
Immature Granulocytes: 5 %
Lymphocytes Relative: 17 %
Lymphs Abs: 1.2 10*3/uL (ref 0.7–4.0)
MCH: 30.3 pg (ref 26.0–34.0)
MCHC: 33.2 g/dL (ref 30.0–36.0)
MCV: 91.4 fL (ref 80.0–100.0)
Monocytes Absolute: 0.3 10*3/uL (ref 0.1–1.0)
Monocytes Relative: 4 %
Neutro Abs: 4.7 10*3/uL (ref 1.7–7.7)
Neutrophils Relative %: 70 %
Platelets: 170 10*3/uL (ref 150–400)
RBC: 4.29 MIL/uL (ref 4.22–5.81)
RDW: 16.9 % — ABNORMAL HIGH (ref 11.5–15.5)
WBC: 6.8 10*3/uL (ref 4.0–10.5)
nRBC: 0.6 % — ABNORMAL HIGH (ref 0.0–0.2)

## 2019-11-10 LAB — COMPREHENSIVE METABOLIC PANEL
ALT: 74 U/L — ABNORMAL HIGH (ref 0–44)
AST: 37 U/L (ref 15–41)
Albumin: 2.9 g/dL — ABNORMAL LOW (ref 3.5–5.0)
Alkaline Phosphatase: 43 U/L (ref 38–126)
Anion gap: 10 (ref 5–15)
BUN: 50 mg/dL — ABNORMAL HIGH (ref 8–23)
CO2: 33 mmol/L — ABNORMAL HIGH (ref 22–32)
Calcium: 9.3 mg/dL (ref 8.9–10.3)
Chloride: 99 mmol/L (ref 98–111)
Creatinine, Ser: 1.4 mg/dL — ABNORMAL HIGH (ref 0.61–1.24)
GFR calc Af Amer: 60 mL/min — ABNORMAL LOW (ref >60)
GFR calc non Af Amer: 52 mL/min — ABNORMAL LOW (ref >60)
Glucose, Bld: 29 mg/dL — CL (ref 70–99)
Potassium: 3.4 mmol/L — ABNORMAL LOW (ref 3.5–5.1)
Sodium: 142 mmol/L (ref 135–145)
Total Bilirubin: 0.7 mg/dL (ref 0.3–1.2)
Total Protein: 5.4 g/dL — ABNORMAL LOW (ref 6.5–8.1)

## 2019-11-10 LAB — GLUCOSE, CAPILLARY
Glucose-Capillary: 165 mg/dL — ABNORMAL HIGH (ref 70–99)
Glucose-Capillary: 31 mg/dL — CL (ref 70–99)
Glucose-Capillary: 99 mg/dL (ref 70–99)
Glucose-Capillary: 148 mg/dL — ABNORMAL HIGH (ref 70–99)
Glucose-Capillary: 215 mg/dL — ABNORMAL HIGH (ref 70–99)

## 2019-11-10 LAB — RETICULOCYTES
Immature Retic Fract: 22.8 % — ABNORMAL HIGH (ref 2.3–15.9)
RBC.: 4.29 MIL/uL (ref 4.22–5.81)
Retic Count, Absolute: 142.4 10*3/uL (ref 19.0–186.0)
Retic Ct Pct: 3.3 % — ABNORMAL HIGH (ref 0.4–3.1)

## 2019-11-10 LAB — D-DIMER, QUANTITATIVE: D-Dimer, Quant: 0.41 ug/mL-FEU (ref 0.00–0.50)

## 2019-11-10 LAB — FOLATE: Folate: 18.7 ng/mL (ref >5.9)

## 2019-11-10 LAB — PHOSPHORUS: Phosphorus: 4.2 mg/dL (ref 2.5–4.6)

## 2019-11-10 LAB — VITAMIN B12: Vitamin B-12: 493 pg/mL (ref 180–914)

## 2019-11-10 LAB — FERRITIN: Ferritin: 128 ng/mL (ref 24–336)

## 2019-11-10 LAB — MAGNESIUM: Magnesium: 1.8 mg/dL (ref 1.7–2.4)

## 2019-11-10 LAB — C-REACTIVE PROTEIN: CRP: 0.7 mg/dL (ref <1.0)

## 2019-11-10 LAB — LACTATE DEHYDROGENASE: LDH: 265 U/L — ABNORMAL HIGH (ref 98–192)

## 2019-11-10 MED ORDER — QUETIAPINE FUMARATE 200 MG PO TABS: 200.0000 mg | ORAL_TABLET | Freq: Every day | ORAL | 0 refills | Status: AC

## 2019-11-10 MED ORDER — INSULIN ASPART 100 UNIT/ML ~~LOC~~ SOLN: 7.0000 [IU] | Freq: Three times a day (TID) | SUBCUTANEOUS | Status: DC

## 2019-11-10 MED ORDER — HYDROCODONE-ACETAMINOPHEN 7.5-325 MG PO TABS: 1.0000 | ORAL_TABLET | ORAL | 0 refills | Status: AC | PRN

## 2019-11-10 MED ORDER — PREGABALIN 50 MG PO CAPS: 50.0000 mg | ORAL_CAPSULE | Freq: Two times a day (BID) | ORAL | 0 refills | Status: AC

## 2019-11-10 MED ORDER — DEXTROSE 50 % IV SOLN
INTRAVENOUS | Status: AC
  Filled 2019-11-10: qty 50

## 2019-11-10 MED ORDER — DEXTROSE 50 % IV SOLN
INTRAVENOUS | Status: AC
  Administered 2019-11-10: 50 mL
  Filled 2019-11-10: qty 50

## 2019-11-10 MED ORDER — ALPRAZOLAM 0.5 MG PO TABS: 0.5000 mg | ORAL_TABLET | Freq: Three times a day (TID) | ORAL | 0 refills | Status: AC | PRN

## 2019-11-10 MED ORDER — POTASSIUM CHLORIDE CRYS ER 20 MEQ PO TBCR: 20.0000 meq | EXTENDED_RELEASE_TABLET | Freq: Two times a day (BID) | ORAL | Status: AC

## 2019-11-10 MED ORDER — FOLIC ACID 1 MG PO TABS: 1.0000 mg | ORAL_TABLET | Freq: Every day | ORAL | 0 refills | Status: AC

## 2019-11-10 MED ORDER — INSULIN ASPART 100 UNIT/ML ~~LOC~~ SOLN: 0.0000 [IU] | Freq: Three times a day (TID) | SUBCUTANEOUS | 11 refills | Status: AC

## 2019-11-10 MED ORDER — ZINC SULFATE 220 (50 ZN) MG PO CAPS: 220.0000 mg | ORAL_CAPSULE | Freq: Every day | ORAL | 0 refills | Status: AC

## 2019-11-10 MED ORDER — INSULIN ASPART 100 UNIT/ML ~~LOC~~ SOLN: 0.0000 [IU] | Freq: Three times a day (TID) | SUBCUTANEOUS | Status: DC

## 2019-11-10 MED ORDER — SALINE SPRAY 0.65 % NA SOLN
1.0000 | NASAL | Status: DC | PRN
  Filled 2019-11-10: qty 44

## 2019-11-10 MED ORDER — GUAIFENESIN-DM 100-10 MG/5ML PO SYRP: 5.0000 mL | ORAL_SOLUTION | ORAL | 0 refills | Status: AC | PRN

## 2019-11-10 MED ORDER — ASCORBIC ACID 500 MG PO TABS: 500.0000 mg | ORAL_TABLET | Freq: Every day | ORAL | 0 refills | Status: AC

## 2019-11-10 MED ORDER — INSULIN DETEMIR 100 UNIT/ML ~~LOC~~ SOLN
20.0000 [IU] | Freq: Two times a day (BID) | SUBCUTANEOUS | Status: DC
  Administered 2019-11-10: 20 [IU] via SUBCUTANEOUS
  Filled 2019-11-10 (×3): qty 0.2

## 2019-11-10 MED ORDER — ADULT MULTIVITAMIN W/MINERALS CH: 1.0000 | ORAL_TABLET | Freq: Every day | ORAL | Status: AC

## 2019-11-10 MED ORDER — INSULIN DETEMIR 100 UNIT/ML ~~LOC~~ SOLN: 20.0000 [IU] | Freq: Two times a day (BID) | SUBCUTANEOUS | 11 refills | Status: AC

## 2019-11-10 MED ORDER — IPRATROPIUM-ALBUTEROL 0.5-2.5 (3) MG/3ML IN SOLN: 0.5000 mL | Freq: Four times a day (QID) | RESPIRATORY_TRACT | 0 refills | Status: AC | PRN

## 2019-11-10 MED ORDER — SALINE SPRAY 0.65 % NA SOLN: 1.0000 | NASAL | 0 refills | Status: AC | PRN

## 2019-11-10 MED ORDER — POTASSIUM CHLORIDE CRYS ER 20 MEQ PO TBCR
40.0000 meq | EXTENDED_RELEASE_TABLET | Freq: Two times a day (BID) | ORAL | Status: AC
  Administered 2019-11-10 (×2): 40 meq via ORAL
  Filled 2019-11-10 (×2): qty 2

## 2019-11-10 MED ORDER — OXYMETAZOLINE HCL 0.05 % NA SOLN: 1.0000 | Freq: Two times a day (BID) | NASAL | 0 refills | Status: AC

## 2019-11-10 NOTE — Progress Notes (Signed)
Inpatient Diabetes Program Recommendations  AACE/ADA: New Consensus Statement on Inpatient Glycemic Control (2015)  Target Ranges:  Prepandial:   less than 140 mg/dL      Peak postprandial:   less than 180 mg/dL (1-2 hours)      Critically ill patients:  140 - 180 mg/dL   Lab Results  Component Value Date   GLUCAP 99 11/10/2019   HGBA1C 10.0 (H) 10/11/2019    Review of Glycemic Control Results for Reginald Duncan, Reginald Duncan (MRN [id number]) as of 11/10/2019 08:49  Ref. Range 11/10/2019 04:11 11/10/2019 04:19 11/10/2019 07:30  Glucose-Capillary Latest Ref Range: 70 - 99 mg/dL 31 (LL) 165 (H) 99   Diabetes history:DM2 Outpatient Diabetes medications:Humulin R U50030units BID(uses aU100 insulin syringe, dose would be 150units of U500) Current orders for Inpatient glycemic control:Levemir 20 units BID, Novolog 7 units TID with meals, Novolog 0-9 units TID  Inpatient Diabetes Program Recommendations  Noted hypoglycemia of 29 mg/dL and subsequent order changes. Assuming related to discontinuation of steroids and patient was on Q4H correction. In agreement and will continue to follow.   Thanks, Bronson Curb, MSN, RNC-OB Diabetes Coordinator [phone] (8a-5p)

## 2019-11-10 NOTE — Progress Notes (Signed)
Pt was discharged to [facility]. All items were taking at discharge. Meds were administer before pt was discharge. Pt's vitals were stable and pt showed no signs of distress.

## 2019-11-10 NOTE — Progress Notes (Signed)
Physical Therapy Treatment Patient Details Name: Reginald Duncan MRN: [id number] DOB: December 24, 1950 Today's Date: 11/10/2019    History of Present Illness 69 year old Caucasian male, obese, with multiple medical and cardiac problems.  Patient carries diagnosis of COPD, coronary artery disease, OSA, chronic kidney disease stage III, chronic pain, PVD, diabetes mellitus, hypertension, CVA and hyperlipidemia, in addition to documented dementia. Presented to ED 10/29/19 where he was found to be COVID+, febrile and SaO2 86-94%O2 on RA and admitted.     PT Comments    Pt up in recliner agreeable to work with therapy. Pt continues to be limited in safe mobility by increased anxiety and claustrophobia when he has increased WoB. In addition, pt continues to have generalized weakness and decreased balance. Pt is min guard for transfers and ambulation of 40 feet with RW. After 20 feet of ambulation pt with 3/4 DoE and LE cramping. Pt ambulated on RA and was able to maintain SaO2 >90% until cramping began and breath became more shallow and SaO2 dropped to 85%O2. With return to room and use of flutter valve pt was able to rebound to 90%O2.  D/c plans remain appropriate. PT will continue to follow acutely.   Follow Up Recommendations  SNF     Equipment Recommendations  None recommended by PT       Precautions / Restrictions Precautions Precautions: None Restrictions Weight Bearing Restrictions: No    Mobility  Bed Mobility               General bed mobility comments: OOB in recliner   Transfers Overall transfer level: Needs assistance Equipment used: Rolling walker (2 wheeled) Transfers: Sit to/from Stand Sit to Stand: Min guard         General transfer comment: min guard for safety, good power up and steadying  Ambulation/Gait Ambulation/Gait assistance: Min guard Gait Distance (Feet): 40 Feet Assistive device: Rolling walker (2 wheeled) Gait Pattern/deviations: Step-through  pattern;Decreased step length - right;Decreased step length - left;Trunk flexed Gait velocity: slowed Gait velocity interpretation: <1.8 ft/sec, indicate of risk for recurrent falls General Gait Details: min guard for safety, 3/4 DoE and c/o leg cramping after 20 feet, retuned to room with slow, mildly unsteady gait       Balance Overall balance assessment: Needs assistance Sitting-balance support: No upper extremity supported;Feet supported;Feet unsupported Sitting balance-Leahy Scale: Good     Standing balance support: Single extremity supported;No upper extremity supported;Bilateral upper extremity supported Standing balance-Leahy Scale: Fair Standing balance comment: on intial standing from recliner able to statically stand before reaching for RW                             Cognition Arousal/Alertness: Awake/alert Behavior During Therapy: Anxious Overall Cognitive Status: Within Functional Limits for tasks assessed                                 General Comments: continues to get very anxious when [facility]      Exercises General Exercises - Lower Extremity Long Arc Quad: AROM;Both;10 reps Hip ABduction/ADduction: AROM;Both;10 reps;Seated Hip Flexion/Marching: AROM;Both;10 reps;Seated Toe Raises: AROM;Both;10 reps;Seated Heel Raises: AROM;Both;10 reps;Seated Other Exercises Other Exercises: 10x flutter valve to slow down breathing     General Comments General comments (skin integrity, edema, etc.): Pt on RA on entry with SaO2 94%O2, with ambulation SaO2 dropped to 85%O2, with pursed lipped breathing and use of  flutter valve pt able to take deeper breaths and rebounded to 91%O2      Pertinent Vitals/Pain Pain Assessment: Faces Faces Pain Scale: Hurts a little bit Pain Location: calf cramping with ambulation Pain Descriptors / Indicators: Sore;Cramping Pain Intervention(s): Limited activity within patient's tolerance;Monitored during  session;Repositioned           PT Goals (current goals can now be found in the care plan section) Acute Rehab PT Goals Patient Stated Goal: go home PT Goal Formulation: With patient Time For Goal Achievement: 11/18/19 Potential to Achieve Goals: Fair Progress towards PT goals: Progressing toward goals    Frequency    Min 2X/week      PT Plan Current plan remains appropriate       AM-PAC PT "6 Clicks" Mobility   Outcome Measure  Help needed turning from your back to your side while in a flat bed without using bedrails?: None Help needed moving from lying on your back to sitting on the side of a flat bed without using bedrails?: None Help needed moving to and from a bed to a chair (including a wheelchair)?: None Help needed standing up from a chair using your arms (e.g., wheelchair or bedside chair)?: None Help needed to walk in hospital room?: None Help needed climbing 3-5 steps with a railing? : A Lot 6 Click Score: 22    End of Session Equipment Utilized During Treatment: Oxygen Activity Tolerance: Patient tolerated treatment well Patient left: in chair;with call bell/phone within reach Nurse Communication: Mobility status PT Visit Diagnosis: Unsteadiness on feet (R26.81);Muscle weakness (generalized) (M62.81);History of falling (Z91.81) Pain - Right/Left: (back)     Time: [id number] PT Time Calculation (min) (ACUTE ONLY): 20 min  Charges:  $Gait Training: 8-22 mins                     Rowe Warman B. Migdalia Dk PT, DPT Acute Rehabilitation Services Pager [phone] Office [phone]    [place] 11/10/2019, 10:23 AM

## 2019-11-10 NOTE — Discharge Summary (Signed)
Physician Discharge Summary  Reginald Duncan [id number] DOB: [birth year] DOA: 10/29/2019  PCP: [name], PA  Admit date: 10/29/2019 Discharge date: 11/10/2019  Admitted From: Home Disposition: SNF; previously active with home health and had a home health RN social work and aide at home but requires skilled nursing facility placement given his weakness  Recommendations for Outpatient Follow-up:  1. Follow up with PCP in 1-2 weeks  2. Follow up Cardiology within 1-2 weeks 3. Follow up with Pulmonary in 1-2 weeks 4. Repeat CXR in 3-6 weeks 5. Please obtain CMP/CBC, Mag, Phos in one week 6. Please follow up on the following pending results:  Home Health: No Equipment/Devices: None  Discharge Condition: Stable  CODE STATUS: FULL CODE Diet recommendation: Heart Healthy Carb Modified Diet   Brief/Interim Summary: This is a 69 year old male, history of obesity, CAD s/p DES 2014, COPD, OSA, CKD 3a, chronic pain, PVD, diabetes, hypertension, CVA, hyperlipidemia, anxiety who presented with worsening shortness of breath times several weeks and new onset chest pain with associated wheezing and fever found to be Covid positive, febrile and tachycardic in ED as well as hypoxic requiring nasal cannula O2 and started on Decadron and remdesivir. CRP noted to be elevated and patient started on Actemra x1 after risk/benefit discussion with patient on 1/7. Also started on Ativan 1 mg as needed twice daily for severe anxiety. Patient was offered to be transferred to [facility] however stated he wished to stay at [facility] for further treatment. Does not wish to be transferred to [facility].  1/9: Patient continued to have decline in respiratory status despite Actemra x1 and 4 days remdesivir and Decadron. Pulmonology consulted.  Continued to have chest pain intermittently and states that he had chest pain the night before last but it is improved now. Troponin was flat  Does not feel like it is  musculoskeletal.  Will check troponins and EKG and oxygen requirement weaning slowly and was down to 3 L but had an acute nose bleed and had to be placed on 6 Liters.  Only he has been weaned off of all supplemental oxygen via nasal cannula and his inflammatory markers have improved significantly.    PT OT recommending skilled nursing facility and patient hesitant to go to skilled nursing facility but is agreeable and have asked social work to do assist with placement.  Anticipate discharging to SNF next 24 to 48 hours however social worker is not able to place the patient in SNF as unable to get in touch with the patient's insurance for insurance authorization.  Social worker now paged me at 16:00 and stated that patient has a bed available and he is stable for discharge to discharge patient to skilled nursing facility and have the patient follow-up with the PCP as well as Pulmonary and Cardiology in outpatient setting.  Discharge Diagnoses:  Active Problems:   Acute on chronic respiratory failure (HCC)  Acute hypoxic respiratory failure secondary to COVID-19 with volume overload, improving and was able to be weaned to room air at rest -Continues to have symptomatic improvement -CTA Chest: negative for PE, with multifocal pneumonia likely viral or atypical etiology -O2 requirement 12 L/min->10 L/min HFNC -> 3 Liters and now 6 Liters after Nose Bleed and is back on 3 Liters; Had another nosebleed yesterday but is now on room air -Started Oxymetazoline for his Nairs  -Repeat CXR on 11/06/2019 showed: "Persistent bilateral airspace lung opacities consistent with multifocal COVID-19 pneumonia. No new lung abnormalities" -CXR on 11/08/2019  showed: "Multilobar bilateral pneumonia redemonstrated, with roughly stable overall aeration compared to the prior study, as detailed above. Mild cardiomegaly. Aortic atherosclerosis."  -Pulmonology consulted 1/9without much to add -Status post Actemra 1/7 x  1 -Completed5 days Remdesivir  -Completed dexamethasone dosing for 10 days -Trend Inflammatory Markers Daily and have trended down  Recent Labs (last 2 labs)        Recent Labs    11/08/19 0518 11/09/19 0311 11/10/19 0250  DDIMER 0.40 0.28 0.41  FERRITIN 155 134 128  LDH 297* 287* 265*  CRP 0.8 0.8 0.7     Recent Labs       Lab Results  Component Value Date   SARSCOV2NAA NEGATIVE 10/09/2019   SARSCOV2NAA NEGATIVE 09/25/2019   [place] NEGATIVE 09/15/2019    SpO2: 93 % O2 Flow Rate (L/min): 2 L/min -Diuresed well with IV Lasix but developed AKI which is improved; Resumed Home lasix at 40 mg po BID and Renal Fxn slightly elevated again but is now improved and trending down -Strict I's and O's  -C/w Antitussives withGuaifenesin-Dextromethorphan 10 mL po q4hprn Cough and Chlorpheniramine-Hydrocodone 5 mL po q12hprn -Check Blood Cx x2 and showed NGTD at 5 Days -C/w Zinc 220 mg po Daily and Vitamin C  -Airborne and Contact Precautions -C/w CombiventScheduled1 puff IH q6hand Albuterol 2 puff IH q6hprn -Add Flutter Valve and Incentive Spirometry; Added Afrin Spray because of Nose bleed and Humidified Air  -Continue to Monitor and Trend Respiratory Status and repeat CXR and Inflammatory Markers in the AM -Ambulatory home O2 screen done and showed the patient needs at least 5 L of supplemental oxygen via nasal cannula while ambulating to maintain his saturations -PT/OT Recommending SNF and anticipating discharge in next 24 to 48 hours if able to get in touch with the patient's insurance company  Volume Overload -On PO lasix at home -Limited echo with improved EF from prior and is now 60-65% -Good diuresis with IV Lasix, net -14.671 L since admit. Held IV Lasix 2/2 to AKI but resumed po Lasix and Renal Fxn is fluctuating but stable -Strict I/O and Daily weights;  -Continue to Monitor for S/Sx of Volume Overload   Viral Sepsis secondary to COVID 19 with initial concern  for underlying HAP -Previously hypothermic, qSOFA: 2 (RR 31, SBP 90) - high risk, CT chest with multifocal pna, no PE -Sepsis has since resolved -MRSA nares negative -No secondary pneumonia per pulmonology on CT scan, off Zosyn and will continue to monitor.  -Check sputum culture if fever -Repeat CXR as above   Lethargy, likely multifactorial: polypharmacy (seroquel/lyrica/xanax), hypoxia -Improvedwith med rec -Patient is severely deconditioned and will need to continue physical therapy and Occupational Therapy as well as ambulate in the halls; Work with PT and OT at SNF  COPD, not in exacerbation -Continue IV steroids and inhalers -Pulm Consulted: Combivent inhaler with technique/strength assessment at bedside by RT Stanton Kidney recommends outpatient pulmonary follow-up to assure optimize pulmonary treatment and will need official PFTs  Severe Anxiety over Healthcare -On xanax per outpatient regimen -Continue decreased seroquel 200 mg QHS  -Needs Reassurance and anticipating D/C to SNF soon whenever insurance authorization is approved  Neuropathy -Continue to decrease Lyrica due to lethargy; Now on 50 mg po BID   Thrombocytopenia, likely reactive -Resolved; his platelet count is now 170,000 -Continue to monitor for signs and symptoms of bleeding and repeat CBC within 1 week  Hypoglycemia, blood sugars on the lower side today and insulin regimen has been adjusted again -Initially had  resolved with D5W, but then became hyperglycemic in setting of steroids and once they have stopped he became hypoglycemic and -CBGs have been now ranging from 31-165; resume home insulin regimen  Uncontrolled Diabetes with Hyperglycemia initially and now Significant Hypoglycemia -HbA1C 10.0 -Insulin regimen has been adjusted; Was on Levemir to 40 mg subcu twice daily, NovoLog subcu 3 times daily with meals to 16 units, and will continue resistant NovoLog sliding scale insulin every 4h. -Now on  Levemir 20 units twice daily, NovoLog 7 units 3 times daily with meals, and sensitive NovoLog sliding scale 3 times daily with improvement in his blood sugar; Will not resume home insulin regimen at discharge (Humalin R 15 units skin daily) but will just send on Levemir 20 units BID and Sensitive Novolog SSI AC and further adjustments to be made at SNF -CBG's ranging rom 31-165; blood sugar on a.m. CMP was 33  OSA -CPAP on hold due to COVID 19 protocol  AKI on CKD 3a -Creatinine was 1.55, baseline 1.2-1.3 and had improved but worsened again. BUN/Cr was now 53/1.40 -Continue Holding Lisinopril for now and resume at discharge -Continue with p.o. Lasix 40 mg twice daily -Avoid nephrotoxic medications, contrast dyes, hypotension and renally adjust medications-continue monitor and trend renal function  -Repeat CMP in a.m.  Chest pain: More likely MSKandanxiety, improved  -Well Score: 0, low risk, PERC: cannot rule out PE, D Dimer negative -CTA without PE, with multifocal pneumonia -Cycle HS Troponin and check EKG; Troponin Negative and EKG never repeated when ordered  -Does not have reproducible pain on palpation -Will get Cardiology opinion given history of CAD once Troponin and EKG are resulted   Hypertension -Continue Amlodipine 2.5 mg po qHS and Carvedilol 50 mg po BID; also continue with Lasix 40 mg p.o. twice daily  CAD s/p DES 2014 -Status post left heart cath 09/01/2019 and showed "Multivessel coronary artery disease with 50-60% mid LAD stenosis and chronic total occlusion of the distal LAD, as well as 70% D2 lesion and sequential 30-40% proximal and 50-60% distal RCA stenoses. Ostial/proximal LAD stent is widely patent. Mildly to moderately left ventricular filling pressure (LVEDP ~24 mmHg)."  -Continue dual antiplatelets with ASA 81 mg po Daily, Clopidogrel 75 mg po Daily, Carvedilol 50 mg po BID and Rosuvastatin 20 mg po daily  -Check HS Troponin and EKG given CP -May Discuss  with Cardiology and get formal consultationafter EKG and Troponins are checked; Troponin Negative but EKG never done. So will order again   Obesity -Estimated body mass index is 38.68 kg/m as calculated from the following:   Height as of this encounter: 6\' 1"  (1.854 m).   Weight as of this encounter: 133 kg. -Weight Loss and Dietary Counseling given   Elevated ALT  -In the setting of COVID-19 disease  -Patient's ALT is has been elevated since January 6 the least and has trended up to 76 and currently is now trending back down to 66 but had a slight bump 74 -Continue to monitor and trend and repeat CMP in a.m.  Hypokalemia -Patient's potassium this morning was 3.4  -Replete with p.o. potassium chloride 40 mg twice daily x2 doses again -Continue to monitor and replete as necessary -Repeat CMP in a.m.  Normocytic Anemia -Patient's hemoglobin/hematocrit is now stable at 13.0/31.2 despite him still having some nosebleeding intermittently -Has been relatively stable last week or so -Checked Anemia Panel and showed an iron level of 114, U IBC of 298, TIBC of 412, saturation ratios of 20%,  ferritin level 128, folate level 15.7, vitamin B12 level 493 -Continue to monitor for signs or symptoms of bleeding; currently no overt bleeding noted but has had nosebleeds throughout the week -Repeat CBC in a.m.   Discharge Instructions  Discharge Instructions    Call MD for:  difficulty breathing, headache or visual disturbances   Complete by: As directed    Call MD for:  extreme fatigue   Complete by: As directed    Call MD for:  hives   Complete by: As directed    Call MD for:  persistant dizziness or light-headedness   Complete by: As directed    Call MD for:  persistant nausea and vomiting   Complete by: As directed    Call MD for:  redness, tenderness, or signs of infection (pain, swelling, redness, odor or green/yellow discharge around incision site)   Complete by: As directed     Call MD for:  severe uncontrolled pain   Complete by: As directed    Call MD for:  temperature >100.4   Complete by: As directed    Diet - low sodium heart healthy   Complete by: As directed    Diet Carb Modified   Complete by: As directed    Discharge instructions   Complete by: As directed    You were cared for by a hospitalist during your hospital stay. If you have any questions about your discharge medications or the care you received while you were in the hospital after you are discharged, you can call the unit and ask to speak with the hospitalist on call if the hospitalist that took care of you is not available. Once you are discharged, your primary care physician will handle any further medical issues. Please note that NO REFILLS for any discharge medications will be authorized once you are discharged, as it is imperative that you return to your primary care physician (or establish a relationship with a primary care physician if you do not have one) for your aftercare needs so that they can reassess your need for medications and monitor your lab values.  Follow up with PCP, Cardiology, and Pulmonary in the outpatient setting. Take all medications as prescribed. If symptoms change or worsen please return to the ED for evaluation   Increase activity slowly   Complete by: As directed      Allergies as of 11/10/2019      Reactions   Mold Extract [trichophyton] Shortness Of Breath, Other (See Comments)   Headaches and respiratory symptoms      Medication List    STOP taking these medications   guaiFENesin 600 MG 12 hr tablet Commonly known as: MUCINEX   insulin regular human CONCENTRATED 500 UNIT/ML kwikpen Commonly known as: HUMULIN R     TAKE these medications   albuterol 108 (90 Base) MCG/ACT inhaler Commonly known as: VENTOLIN HFA Inhale 2 puffs into the lungs every 6 (six) hours as needed for wheezing or shortness of breath.   ALPRAZolam 0.5 MG tablet Commonly known as:  XANAX Take 1 tablet (0.5 mg total) by mouth 3 (three) times daily as needed for anxiety. Discuss with PCP regarding refills / increased dose What changed: when to take this   amLODipine 2.5 MG tablet Commonly known as: NORVASC Take 2.5 mg by mouth at bedtime.   ascorbic acid 500 MG tablet Commonly known as: VITAMIN C Take 1 tablet (500 mg total) by mouth daily. Start taking on: November 11, 2019   aspirin 81  MG tablet Take 81 mg by mouth at bedtime.   carvedilol 25 MG tablet Commonly known as: COREG Take 50 mg by mouth 2 (two) times daily.   clopidogrel 75 MG tablet Commonly known as: PLAVIX TAKE 1 TABLET BY MOUTH EVERY DAY WITH BREAKFAST What changed:   how much to take  how to take this  when to take this  additional instructions   famotidine 20 MG tablet Commonly known as: PEPCID Take 20 mg by mouth at bedtime.   fluticasone 50 MCG/ACT nasal spray Commonly known as: FLONASE Place 2 sprays into both nostrils 3 (three) times daily.   folic acid 1 MG tablet Commonly known as: FOLVITE Take 1 tablet (1 mg total) by mouth daily. Start taking on: November 11, 2019   furosemide 40 MG tablet Commonly known as: LASIX Take 1 tablet (40 mg total) by mouth 2 (two) times daily.   guaiFENesin-dextromethorphan 100-10 MG/5ML syrup Commonly known as: ROBITUSSIN DM Take 5 mLs by mouth every 4 (four) hours as needed for cough.   HYDROcodone-acetaminophen 7.5-325 MG tablet Commonly known as: NORCO Take 1 tablet by mouth every 4 (four) hours as needed for moderate pain.   insulin aspart 100 UNIT/ML injection Commonly known as: novoLOG Inject 0-9 Units into the skin 3 (three) times daily with meals.   insulin detemir 100 UNIT/ML injection Commonly known as: LEVEMIR Inject 0.2 mLs (20 Units total) into the skin 2 (two) times daily.   ipratropium-albuterol 0.5-2.5 (3) MG/3ML Soln Commonly known as: DUONEB Inhale 0.5 mLs into the lungs every 6 (six) hours as needed. What  changed:   when to take this  reasons to take this   multivitamin with minerals Tabs tablet Take 1 tablet by mouth daily. Start taking on: November 11, 2019   nitroGLYCERIN 0.4 MG SL tablet Commonly known as: NITROSTAT Place 1 tablet (0.4 mg total) under the tongue every 5 (five) minutes as needed. Chest pain   oxymetazoline 0.05 % nasal spray Commonly known as: AFRIN Place 1 spray into both nostrils 2 (two) times daily.   potassium chloride SA 20 MEQ tablet Commonly known as: KLOR-CON Take 1 tablet (20 mEq total) by mouth 2 (two) times daily.   pregabalin 50 MG capsule Commonly known as: LYRICA Take 1 capsule (50 mg total) by mouth 2 (two) times daily. What changed:   medication strength  how much to take  when to take this  additional instructions   QUEtiapine 200 MG tablet Commonly known as: SEROQUEL Take 1 tablet (200 mg total) by mouth at bedtime. What changed:   medication strength  how much to take   rosuvastatin 20 MG tablet Commonly known as: CRESTOR TAKE 1 TABLET DAILY   sodium chloride 0.65 % Soln nasal spray Commonly known as: OCEAN Place 1 spray into both nostrils as needed for congestion.   umeclidinium-vilanterol 62.5-25 MCG/INH Aepb Commonly known as: ANORO ELLIPTA Inhale 1 puff into the lungs daily. What changed: when to take this   Vascepa 1 g capsule Generic drug: icosapent Ethyl Take 2 g by mouth 2 (two) times daily.   Vitamin D (Ergocalciferol) 1.25 MG (50000 UNIT) Caps capsule Commonly known as: DRISDOL Take 50,000 Units by mouth every Monday.   zinc sulfate 220 (50 Zn) MG capsule Take 1 capsule (220 mg total) by mouth daily. Start taking on: November 11, 2019       Contact information for follow-up providers    [facility]. Go on 11/17/2019.   Specialty:  Family Medicine Why: appointment time of 1:40pm Contact information: [place] [address] [phone]        [name], [place].  Call.   Specialty: Family Medicine Why: Follow up after D/C Contact information: [place] [address] [phone]        [name], MD Follow up.   Specialty: Cardiology Why: Follow up in 1-2 weeks  Contact information: [place] [address] [phone]            Contact information for after-discharge care    [facility] .   Service: Skilled Nursing Contact information: [place] [address] [phone]                 Allergies  Allergen Reactions  . Mold Extract [Trichophyton] Shortness Of Breath and Other (See Comments)    Headaches and respiratory symptoms    Consultations:  PCCM/Pulmonary  Discussed with Cardiology Dr. Gwenlyn Found   Procedures/Studies: CT ANGIO CHEST PE W OR WO CONTRAST  Result Date: 11/01/2019 CLINICAL DATA:  69 year old male with shortness of breath. Positive COVID-19. EXAM: CT ANGIOGRAPHY CHEST WITH CONTRAST TECHNIQUE: Multidetector CT imaging of the chest was performed using the standard protocol during bolus administration of intravenous contrast. Multiplanar CT image reconstructions and MIPs were obtained to evaluate the vascular anatomy. CONTRAST:  6mL OMNIPAQUE IOHEXOL 350 MG/ML SOLN COMPARISON:  Chest CT dated 09/13/2019. FINDINGS: Cardiovascular: There is mild cardiomegaly. Three-vessel coronary vascular calcification. No pericardial effusion. There is moderate atherosclerotic calcification of the thoracic aorta. No aneurysmal dilatation. Evaluation of the pulmonary arteries is limited due to respiratory motion artifact and suboptimal visualization and opacification of the peripheral branches. No large central pulmonary artery embolus identified. Mediastinum/Nodes: There is no hilar or mediastinal adenopathy. The esophagus is grossly unremarkable. No mediastinal fluid collection. There is moderate mediastinal lipomatosis.  Lungs/Pleura: Bilateral patchy and streaky airspace densities most consistent with multifocal pneumonia, likely viral or atypical in etiology. Clinical correlation is recommended. There is no pleural effusion or pneumothorax. The central airways are patent. Upper Abdomen: No acute abnormality. Musculoskeletal: Degenerative changes of the spine. No acute osseous pathology. Review of the MIP images confirms the above findings. IMPRESSION: 1. No CT evidence of central pulmonary artery embolus. 2. Multifocal pneumonia, likely viral or atypical in etiology. Clinical correlation is recommended. 3. Cardiomegaly with 3 vessel coronary vascular calcification 4. Aortic Atherosclerosis (ICD10-I70.0). Electronically Signed   By: Anner Crete M.D.   On: 11/01/2019 00:14   DG CHEST PORT 1 VIEW  Result Date: 11/08/2019 CLINICAL DATA:  69 year old male with history of shortness of breath. COVID-19 positive. EXAM: PORTABLE CHEST 1 VIEW COMPARISON:  Chest x-ray 11/07/2019. FINDINGS: Again noted are patchy multifocal ill-defined opacities and areas of interstitial prominence, most evident throughout the mid to lower lungs bilaterally (left greater than right). Overall, aeration appears similar to the prior examination, though findings have shifted. Specifically, but aeration appears slightly worsened in the lateral aspect of the left mid lung, but has improved slightly in the right mid to lower lung. No pleural effusions. No pneumothorax. Pulmonary vasculature is partially obscured, but there are no definitive findings to suggest pulmonary edema. Heart size is mildly enlarged. The patient is rotated to the right on today's exam, resulting in distortion of the mediastinal contours and reduced diagnostic sensitivity and specificity for mediastinal pathology. Aortic atherosclerosis. IMPRESSION: 1. Multilobar bilateral pneumonia redemonstrated,  with roughly stable overall aeration compared to the prior study, as detailed above. 2.  Mild cardiomegaly. 3. Aortic atherosclerosis. Electronically Signed   By: Vinnie Langton M.D.   On: 11/08/2019 10:10   DG CHEST PORT 1 VIEW  Result Date: 11/07/2019 CLINICAL DATA:  Shortness of breath. EXAM: PORTABLE CHEST 1 VIEW COMPARISON:  11/06/2019 FINDINGS: Lungs are adequately inflated with stable patchy airspace opacification over the mid to lower lungs likely infection. No effusion. Stable cardiomegaly. Remainder of the exam is unchanged. IMPRESSION: Stable patchy airspace process over the mid to lower lungs likely multifocal infection. Stable cardiomegaly. Electronically Signed   By: Marin Olp M.D.   On: 11/07/2019 08:00   DG CHEST PORT 1 VIEW  Result Date: 11/06/2019 CLINICAL DATA:  COVID positive on 10/29/2019.  Short of breath. EXAM: PORTABLE CHEST 1 VIEW COMPARISON:  11/01/2019 FINDINGS: Patchy hazy airspace opacities in the mid and lower lungs bilaterally are similar to the prior exam. No new lung abnormalities. No convincing pleural effusion.  No pneumothorax. IMPRESSION: 1. Persistent bilateral airspace lung opacities consistent with multifocal COVID-19 pneumonia. No new lung abnormalities. Electronically Signed   By: Lajean Manes M.D.   On: 11/06/2019 13:15   DG CHEST PORT 1 VIEW  Result Date: 11/01/2019 CLINICAL DATA:  COVID-19 positive.  Shortness of breath. EXAM: PORTABLE CHEST 1 VIEW COMPARISON:  October 29, 2019 FINDINGS: Bilateral pulmonary infiltrates, particularly in the mid lower lungs, have worsened in the interval. No other changes. IMPRESSION: Worsening bilateral pulmonary infiltrates. Electronically Signed   By: Dorise Bullion III M.D   On: 11/01/2019 16:25   DG Chest Port 1 View  Result Date: 10/29/2019 CLINICAL DATA:  Shortness of breath and COPD EXAM: PORTABLE CHEST 1 VIEW COMPARISON:  10/09/2019 FINDINGS: Cardiac shadow is enlarged but accentuated by the frontal technique. The lungs are well aerated bilaterally. Bilateral atelectatic changes are noted left  slightly greater than right when compared with the prior exam. No bony abnormality is noted. IMPRESSION: Bibasilar atelectasis left greater than right. Electronically Signed   By: Inez Catalina M.D.   On: 10/29/2019 18:25   ECHOCARDIOGRAM LIMITED  Result Date: 11/02/2019   ECHOCARDIOGRAM LIMITED REPORT   Patient Name:   RASHI PREVOT Date of Exam: 11/02/2019 Medical Rec #:  [id number]     Height:       73.0 in Accession #:    [id number]    Weight:       299.8 lb Date of Birth:  September 27, 1951     BSA:          2.56 m Patient Age:    70 years      BP:           124/82 mmHg Patient Gender: M             HR:           66 bpm. Exam Location:  Inpatient  Procedure: Limited Echo, Limited Color Doppler, Cardiac Doppler and Intracardiac            Opacification Agent Indications:    dyspnea 786.09  History:        Patient has prior history of Echocardiogram examinations, most                 recent 08/13/2018.  Sonographer:    Johny Chess Referring Phys: [id number] JARED E SEGAL  Sonographer Comments: Suboptimal apical window and Technically challenging study due to limited acoustic windows. Image acquisition challenging due to respiratory  motion. IMPRESSIONS  1. Technically difficult study with limited views even after administration of contrast. Left ventricular ejection fraction, by visual estimation, is 60 to 65%. The left ventricle has normal function. There is mildly increased left ventricular wall thickness.  2. Right ventricle was not well visualized. On limited views appears mildly dilated with grossly normal systolic function  3. The mitral valve is normal in structure. No evidence of mitral valve regurgitation.  4. The tricuspid valve was grossly normal. Tricuspid valve regurgitation is not demonstrated.  5. The aortic valve was not well visualized. Aortic valve regurgitation is not visualized.  6. TR signal is inadequate for assessing pulmonary artery systolic pressure. FINDINGS  Left Ventricle: Left ventricular  ejection fraction, by visual estimation, is 60 to 65%. The left ventricle has normal function. There is mildly increased left ventricular wall thickness. Right Ventricle: The right ventricular size is NWV. Right vetricular wall thickness was not assessed. Global RV systolic function is was not well visualized. Left Atrium: Left atrial size was not well visualized. Right Atrium: Right atrial size was not well visualized. Right atrial pressure is estimated at 3 mmHg. Pericardium: Trivial pericardial effusion is present is seen. Trivial pericardial effusion is present. Mitral Valve: The mitral valve is normal in structure. No evidence of mitral valve regurgitation. Tricuspid Valve: The tricuspid valve is grossly normal. Tricuspid valve regurgitation is not demonstrated. Aortic Valve: The aortic valve was not well visualized. Aortic valve regurgitation is not visualized. Pulmonic Valve: The pulmonic valve was not well visualized. Pulmonic valve regurgitation is not visualized by color flow Doppler. Pulmonic regurgitation is not visualized by color flow Doppler. Aorta: The aortic root is normal in size and structure. Shunts: The interatrial septum was not well visualized.  LEFT VENTRICLE          Normals PLAX 2D LVIDd:         5.20 cm  3.6 cm LVIDs:         3.00 cm  1.7 cm LV PW:         1.20 cm  1.4 cm LV IVS:        1.10 cm  1.3 cm LVOT diam:     2.10 cm  2.0 cm LV SV:         95 ml    79 ml LV SV Index:   34.99    45 ml/m2 LVOT Area:     3.46 cm 3.14 cm2  LEFT ATRIUM         Index LA diam:    3.60 cm 1.41 cm/m   AORTA                 Normals Ao Root diam: 3.70 cm 31 mm  SHUNTS Systemic Diam: 2.10 cm  Oswaldo Milian MD Electronically signed by Oswaldo Milian MD Signature Date/Time: 11/02/2019/4:13:57 PMThe mitral valve is normal in structure.    Final    ECHOCARDIOGRAM IMPRESSIONS   1. Technically difficult study with limited views even after administration of contrast. Left ventricular ejection  fraction, by visual estimation, is 60 to 65%. The left ventricle has normal function. There is mildly increased left ventricular wall  thickness. 2. Right ventricle was not well visualized. On limited views appears mildly dilated with grossly normal systolic function 3. The mitral valve is normal in structure. No evidence of mitral valve regurgitation. 4. The tricuspid valve was grossly normal. Tricuspid valve regurgitation is not demonstrated. 5. The aortic valve was not well visualized. Aortic valve regurgitation is  not visualized. 6. TR signal is inadequate for assessing pulmonary artery systolic pressure.  FINDINGS Left Ventricle: Left ventricular ejection fraction, by visual estimation, is 60 to 65%. The left ventricle has normal function. There is mildly increased left ventricular wall thickness.  Right Ventricle: The right ventricular size is NWV. Right vetricular wall thickness was not assessed. Global RV systolic function is was not well visualized.  Left Atrium: Left atrial size was not well visualized.  Right Atrium: Right atrial size was not well visualized. Right atrial pressure is estimated at 3 mmHg.  Pericardium: Trivial pericardial effusion is present is seen. Trivial pericardial effusion is present.  Mitral Valve: The mitral valve is normal in structure. No evidence of mitral valve regurgitation.  Tricuspid Valve: The tricuspid valve is grossly normal. Tricuspid valve regurgitation is not demonstrated.  Aortic Valve: The aortic valve was not well visualized. Aortic valve regurgitation is not visualized.  Pulmonic Valve: The pulmonic valve was not well visualized. Pulmonic valve regurgitation is not visualized by color flow Doppler. Pulmonic regurgitation is not visualized by color flow Doppler.  Aorta: The aortic root is normal in size and structure.  Shunts: The interatrial septum was not well visualized.   LEFT VENTRICLE Normals PLAX  2D LVIDd: 5.20 cm 3.6 cm LVIDs: 3.00 cm 1.7 cm LV PW: 1.20 cm 1.4 cm LV IVS: 1.10 cm 1.3 cm LVOT diam: 2.10 cm 2.0 cm LV SV: 95 ml 79 ml LV SV Index: 34.99 45 ml/m2 LVOT Area: 3.46 cm 3.14 cm2   LEFT ATRIUM Index LA diam: 3.60 cm 1.41 cm/m  AORTA Normals Ao Root diam: 3.70 cm 31 mm   SHUNTS Systemic Diam: 2.10 cm  Subjective: Patient seen and examined at bedside and he states that he had a rough night and states that his nose started bleeding significantly again but states he touched it again.  No nausea or vomiting.  Patient's blood sugar did drop yesterday evening and he became hypoglycemic and was symptomatic but likely got too much insulin.  No chest pain, lightheadedness or dizziness now.  Still anxious about going to SNF but happy that his inflammatory markers are improved.  No other concerns or complaints at this time is stable to go to SNF and all his questions were answered prior to discharge.  Discharge Exam: Vitals:   11/10/19 0800 11/10/19 1100  BP: 113/67 110/70  Pulse: (!) 58 62  Resp:    Temp:  98.2 F (36.8 C)  SpO2:  93%   Vitals:   11/10/19 0447 11/10/19 0535 11/10/19 0800 11/10/19 1100  BP:   113/67 110/70  Pulse:  (!) 53 (!) 58 62  Resp:  13    Temp: 97.6 F (36.4 C)   98.2 F (36.8 C)  TempSrc: Axillary   Axillary  SpO2:  (!) 89%  93%  Weight:  133 kg    Height:       Examination: Physical Exam:  Constitutional: WN/WD obese Caucasian male currently in no acute distress and was napping when I walked in and does not appear as anxious today.  Eyes: Lids and conjunctivae normal, sclerae anicteric  ENMT: External Ears, Nose appear normal. Grossly normal hearing. Mucous membranes are moist.  Neck: Appears normal, supple, no cervical masses, normal ROM, no appreciable thyromegaly; no JVD Respiratory: Diminished to auscultation bilaterally with coarse  breath sounds, no wheezing, rales, rhonchi or crackles. Normal respiratory effort and patient is not tachypenic. No accessory muscle use.  Not wearing any supplemental oxygen via nasal  cannula and his saturations were in the low 90s Cardiovascular: RRR, no murmurs / rubs / gallops. S1 and S2 auscultated.  1+ extremity edema. Abdomen: Soft, non-tender, Distended 2/2 body habitus. Bowel sounds positive x4.  GU: Deferred. Musculoskeletal: No clubbing / cyanosis of digits/nails. No joint deformity upper and lower extremities.  Skin: No rashes, lesions, ulcers on limited skin evaluation with lower extremity venous stasis changes. No induration; Warm and dry.  Neurologic: CN 2-12 grossly intact with no focal deficits. Romberg sign and cerebellar reflexes not assessed.  Psychiatric: Normal judgment and insight. Alert and oriented x 3. Not as anxious today.  appropriate affect.   The results of significant diagnostics from this hospitalization (including imaging, microbiology, ancillary and laboratory) are listed below for reference.    Microbiology: Recent Results (from the past 240 hour(s))  Culture, blood (routine x 2)     Status: None   Collection Time: 11/01/19  7:00 PM   Specimen: BLOOD RIGHT HAND  Result Value Ref Range Status   Specimen Description BLOOD RIGHT HAND  Final   Special Requests   Final    BOTTLES DRAWN AEROBIC ONLY Blood Culture adequate volume   Culture   Final    NO GROWTH 5 DAYS Performed at [facility], [address] [place], [place] [address]    Report Status 11/06/2019 FINAL  Final  Culture, blood (routine x 2)     Status: None   Collection Time: 11/01/19  7:10 PM   Specimen: BLOOD RIGHT HAND  Result Value Ref Range Status   Specimen Description BLOOD RIGHT HAND  Final   Special Requests   Final    BOTTLES DRAWN AEROBIC ONLY Blood Culture adequate volume   Culture   Final    NO GROWTH 5 DAYS Performed at [facility], [place] [address]  [place], [place] [address]    Report Status 11/06/2019 FINAL  Final    Labs: BNP (last 3 results) Recent Labs    09/25/19 0440 10/09/19 1142 10/30/19 0059  BNP 37.9 58.2 0000000   Basic Metabolic Panel: Recent Labs  Lab 11/06/19 0224 11/07/19 0205 11/08/19 0518 11/09/19 0311 11/10/19 0250  NA 137 137 139 140 142  K 3.8 4.0 3.9 3.3* 3.4*  CL 92* 94* 94* 94* 99  CO2 35* 31 35* 34* 33*  GLUCOSE 233* 183* 140* 75 29*  BUN 51* 57* 50* 53* 50*  CREATININE 1.54* 1.40* 1.37* 1.49* 1.40*  CALCIUM 9.0 9.2 9.2 9.3 9.3  MG 1.8 1.7 1.8 1.8 1.8  PHOS 3.3 3.5 3.9 4.4 4.2   Liver Function Tests: Recent Labs  Lab 11/06/19 0224 11/07/19 0205 11/08/19 0518 11/09/19 0311 11/10/19 0250  AST 30 27 32 32 37  ALT 70* 62* 70* 66* 74*  ALKPHOS 56 51 49 48 43  BILITOT 0.6 0.8 0.8 0.9 0.7  PROT 5.4* 5.3* 5.5* 5.3* 5.4*  ALBUMIN 2.7* 2.6* 2.8* 2.8* 2.9*   No results for input(s): LIPASE, AMYLASE in the last 168 hours. No results for input(s): AMMONIA in the last 168 hours. CBC: Recent Labs  Lab 11/06/19 0224 11/07/19 0205 11/08/19 0518 11/09/19 0311 11/10/19 0250  WBC 5.8 6.3 7.5 7.8 6.8  NEUTROABS 4.2 4.6 5.9 5.9 4.7  HGB 13.2 12.2* 12.7* 12.4* 13.0  HCT 41.6 37.5* 39.4 37.8* 39.2  MCV 92.7 91.7 92.3 91.3 91.4  PLT 176 171 164 170 170   Cardiac Enzymes: No results for input(s): CKTOTAL, CKMB, CKMBINDEX, TROPONINI in the last 168  hours. BNP: Invalid input(s): POCBNP CBG: Recent Labs  Lab 11/09/19 2343 11/10/19 0411 11/10/19 0419 11/10/19 0730 11/10/19 1153  GLUCAP 244* 31* 165* 99 148*   D-Dimer Recent Labs    11/09/19 0311 11/10/19 0250  DDIMER 0.28 0.41   Hgb A1c No results for input(s): HGBA1C in the last 72 hours. Lipid Profile No results for input(s): CHOL, HDL, LDLCALC, TRIG, CHOLHDL, LDLDIRECT in the last 72 hours. Thyroid function studies No results for input(s): TSH, T4TOTAL, T3FREE, THYROIDAB in the last 72 hours.  Invalid input(s): FREET3 Anemia work  up Recent Labs    11/09/19 0311 11/10/19 0250  VITAMINB12  --  493  FOLATE  --  18.7  FERRITIN 134 128  TIBC  --  412  IRON  --  114  RETICCTPCT  --  3.3*   Urinalysis    Component Value Date/Time   COLORURINE STRAW (A) 10/09/2019 1922   APPEARANCEUR CLEAR 10/09/2019 1922   LABSPEC 1.017 10/09/2019 [place] 5.0 10/09/2019 1922   GLUCOSEU >=500 (A) 10/09/2019 [place] NEGATIVE 10/09/2019 [place] NEGATIVE 10/09/2019 [place] NEGATIVE 10/09/2019 1922   PROTEINUR 30 (A) 10/09/2019 1922   NITRITE NEGATIVE 10/09/2019 1922   LEUKOCYTESUR NEGATIVE 10/09/2019 1922   Sepsis Labs Invalid input(s): PROCALCITONIN,  WBC,  LACTICIDVEN Microbiology Recent Results (from the past 240 hour(s))  Culture, blood (routine x 2)     Status: None   Collection Time: 11/01/19  7:00 PM   Specimen: BLOOD RIGHT HAND  Result Value Ref Range Status   Specimen Description BLOOD RIGHT HAND  Final   Special Requests   Final    BOTTLES DRAWN AEROBIC ONLY Blood Culture adequate volume   Culture   Final    NO GROWTH 5 DAYS Performed at [facility], [address] [place], [place] [address]    Report Status 11/06/2019 FINAL  Final  Culture, blood (routine x 2)     Status: None   Collection Time: 11/01/19  7:10 PM   Specimen: BLOOD RIGHT HAND  Result Value Ref Range Status   Specimen Description BLOOD RIGHT HAND  Final   Special Requests   Final    BOTTLES DRAWN AEROBIC ONLY Blood Culture adequate volume   Culture   Final    NO GROWTH 5 DAYS Performed at [facility], [place] [address], [place], [place] [address]    Report Status 11/06/2019 FINAL  Final   Time coordinating discharge: 35 minutes  SIGNED:  Kerney Elbe, DO [facility] 11/10/2019, 4:27 PM Pager is on [email or web address]  If 7PM-7AM, please contact night-coverage [email or web address] Password [email or web address]

## 2019-11-10 NOTE — Progress Notes (Signed)
Paged [facility] provider Jeannette Corpus, NP regarding hypoglycemic event of CBG 31.  One amp of D50 was given per hypoglycemic protocol, repeat CBG 165.  NP adjusted sliding scale parameters, please see new orders.

## 2019-11-10 NOTE — TOC Transition Note (Signed)
Transition of Care [facility]) - CM/SW Discharge Note   Patient Details  Name: Reginald Duncan MRN: [id number] Date of Birth: [birth year]  [facility] CM/SW Contact:  Benard Halsted, [place] Phone Number: 11/10/2019, 4:40 PM   Clinical Narrative:    Patient will DC to: [facility]  Anticipated DC date: 11/10/19 Family notified: Pt alerting family  Transport by: Corey Harold   Per MD patient ready for DC to [facility], patient, patient's family, and facility notified of DC. Discharge Summary and FL2 sent to facility. RN to call report prior to discharge [phone]). DC packet on chart. Ambulance transport requested for patient.   CSW will sign off for now as social work intervention is no longer needed. Please consult [place] again if new needs arise.  Cedric Fishman, LCSW Clinical Social Worker [phone]    Final next level of care: Skilled Nursing Facility Barriers to Discharge: No Barriers Identified   Patient Goals and CMS Choice Patient states their goals for this hospitalization and ongoing recovery are:: Rehab CMS Medicare.gov Compare Post Acute Care list provided to:: Patient Choice offered to / list presented to : Patient  Discharge Placement                       Discharge Plan and Services In-house Referral: Clinical Social Work   Post Acute Care Choice: [place]                               Social Determinants of Health (SDOH) Interventions     Readmission Risk Interventions Readmission Risk Prevention Plan 10/31/2019 09/27/2019  Transportation Screening Complete Complete  PCP or Specialist Appt within 3-5 Days - Complete  HRI or [place] - Complete  Social Work Consult for [place] Planning/Counseling - Complete  Palliative Care Screening - Not Applicable  Medication Review Press photographer) Complete Complete  PCP or Specialist appointment within 3-5 days of discharge Complete -  [place] or Home Care Consult  Complete -  Some recent data might be hidden

## 2019-11-10 NOTE — Progress Notes (Signed)
PROGRESS NOTE    Reginald Duncan  [id number] DOB: Apr 30, 1951 DOA: 10/29/2019 PCP: [name], PA   Brief Narrative:  This is a 69 year old male, history of obesity, CAD s/p DES 2014, COPD, OSA, CKD 3a, chronic pain, PVD, diabetes, hypertension, CVA, hyperlipidemia, anxiety who presented with worsening shortness of breath times several weeks and new onset chest pain with associated wheezing and fever found to be Covid positive, febrile and tachycardic in ED as well as hypoxic requiring nasal cannula O2 and started on Decadron and remdesivir.  CRP noted to be elevated and patient started on Actemra x1 after risk/benefit discussion with patient on 1/7.  Also started on Ativan 1 mg as needed twice daily for severe anxiety.  Patient was offered to be transferred to [facility] however stated he wished to stay at [facility] for further treatment. Does not wish to be transferred to [facility].  1/9: Patient continued to have decline in respiratory status despite Actemra x1 and 4 days remdesivir and Decadron.  Pulmonology consulted.  Continued to have chest pain intermittently and states that he had chest pain the night before last but it is improved now. Troponin was flat  Does not feel like it is musculoskeletal.  Will check troponins and EKG and oxygen requirement weaning slowly and was down to 3 L but had an acute nose bleed and had to be placed on 6 Liters.  Only he has been weaned off of all supplemental oxygen via nasal cannula and his inflammatory markers have improved significantly.    PT OT recommending skilled nursing facility and patient hesitant to go to skilled nursing facility but is agreeable and have asked social work to do assist with placement.  Anticipate discharging to SNF next 24 to 48 hours however social worker is not able to place the patient in SNF as unable to get in touch with the patient's insurance for insurance authorization  Assessment & Plan:   Active Problems:   Acute on  chronic respiratory failure (HCC)  Acute hypoxic respiratory failure secondary to COVID-19 with volume overload, improving and was able to be weaned to room air at rest -Continues to have symptomatic improvement -CTA Chest: negative for PE, with multifocal pneumonia likely viral or atypical etiology -O2 requirement 12 L/min-> 10 L/min HFNC -> 3 Liters and now 6 Liters after Nose Bleed and is back on 3 Liters; Had another nosebleed yesterday but is now on room air -Started Oxymetazoline for his Nairs  -Repeat CXR on 11/06/2019 showed: "Persistent bilateral airspace lung opacities consistent with multifocal COVID-19 pneumonia. No new lung abnormalities" -CXR on 11/08/2019 showed: "Multilobar bilateral pneumonia redemonstrated, with roughly stable overall aeration compared to the prior study, as detailed above. Mild cardiomegaly. Aortic atherosclerosis."  -Pulmonology consulted 1/9 without much to add -Status post Actemra 1/7 x 1 -Completed 5 days Remdesivir  -Completed dexamethasone dosing for 10 days -Trend Inflammatory Markers Daily and have trended down  Recent Labs    11/08/19 0518 11/09/19 0311 11/10/19 0250  DDIMER 0.40 0.28 0.41  FERRITIN 155 134 128  LDH 297* 287* 265*  CRP 0.8 0.8 0.7   Lab Results  Component Value Date   SARSCOV2NAA NEGATIVE 10/09/2019   SARSCOV2NAA NEGATIVE 09/25/2019   [place] NEGATIVE 09/15/2019  SpO2: 93 % O2 Flow Rate (L/min): 2 L/min -Diuresed well with IV Lasix but developed AKI which is improved; Resumed Home lasix at 40 mg po BID and Renal Fxn slightly elevated again but is now improved and trending down -  Strict I's and O's  -C/w Antitussives with Guaifenesin-Dextromethorphan 10 mL po q4hprn Cough and Chlorpheniramine-Hydrocodone 5 mL po q12hprn -Check Blood Cx x2 and showed NGTD at 5 Days -C/w Zinc 220 mg po Daily and Vitamin C  -Airborne and Contact Precautions -C/w Combivent Scheduled 1 puff IH q6h and Albuterol 2 puff IH q6hprn -Add  Flutter Valve and Incentive Spirometry; Added Afrin Spray because of Nose bleed and Humidified Air  -Continue to Monitor and Trend Respiratory Status and repeat CXR and Inflammatory Markers in the AM -Ambulatory home O2 screen done and showed the patient needs at least 5 L of supplemental oxygen via nasal cannula while ambulating to maintain his saturations -PT/OT Recommending SNF and anticipating discharge in next 24 to 48 hours if able to get in touch with the patient's insurance company  Volume Overload -On PO lasix at home -Limited echo with improved EF from prior and is now 60-65% -Good diuresis with IV Lasix, net -14.671 L since admit. Held IV Lasix 2/2 to AKI but resumed po Lasix and Renal Fxn is fluctuating but stable -Strict I/O and Daily weights;  -Continue to Monitor for S/Sx of Volume Overload   Viral Sepsis secondary to COVID 19 with initial concern for underlying HAP -Previously hypothermic, qSOFA: 2 (RR 31, SBP 90) - high risk, CT chest with multifocal pna, no PE -Sepsis has since resolved -MRSA nares negative -No secondary pneumonia per pulmonology on CT scan, off Zosyn and monitor.  -Check sputum culture if fever -Repeat CXR as above   Lethargy, likely multifactorial: polypharmacy (seroquel/lyrica/xanax), hypoxia -Improved with med rec -Patient is severely deconditioned and will need to continue physical therapy and Occupational Therapy as well as ambulate in the halls  COPD, not in exacerbation -Continue IV steroids and inhalers -Pulm Consulted: Combivent inhaler with technique/strength assessment at bedside by RT Stanton Kidney recommends outpatient pulmonary follow-up to assure optimize pulmonary treatment and will need official PFTs  Severe Anxiety over Healthcare -On xanax per outpatient regimen -Continue decreased seroquel 200 mg QHS  -Needs Reassurance and anticipating D/C to SNF soon whenever insurance authorization is approved  Neuropathy -Continue to decrease  Lyrica due to lethargy; Now on 50 mg po BID   Thrombocytopenia, likely reactive -Resolved; his platelet count is now 170,000  Hypoglycemia, blood sugars on the lower side today and insulin regimen has been adjusted again -Initially had resolved with D5W, but then became hyperglycemic in setting of steroids and once they have stopped he became hypoglycemic and -CBGs have been now ranging from 31-1 65  Uncontrolled Diabetes with Hyperglycemia initially and now Significant Hypoglycemia -HbA1C 10.0 -Insulin regimen has been adjusted; Was on Levemir to 40 mg subcu twice daily, NovoLog subcu 3 times daily with meals to 16 units, and will continue resistant NovoLog sliding scale insulin every 4h. -Now on Levemir 20 units twice daily, NovoLog 7 units 3 times daily with meals, and sensitive NovoLog sliding scale 3 times daily -CBG's ranging rom 31-165; blood sugar on a.m. CMP was 33  OSA -CPAP on hold due to COVID 19 protocol  AKI on CKD 3a  -Creatinine was 1.55, baseline 1.2-1.3 and had improved but worsened again. BUN/Cr was now 53/1.40 -Continue Holding Lisinopril for now -Continue with p.o. Lasix 40 mg twice daily -Nephrotoxic medications, contrast dyes, hypotension and renally adjust medications-continue monitor and trend renal function  -repeat CMP in a.m.  Chest pain: More likely MSK and anxiety, improved  -Well Score: 0, low risk, PERC: cannot rule out PE, D Dimer negative -  CTA without PE, with multifocal pneumonia -Cycle HS Troponin and check EKG; Troponin Negative and EKG never repeated when ordered  -Does not have reproducible pain on palpation -Will get Cardiology opinion given history of CAD once Troponin and EKG are resulted   Hypertension -Continue Amlodipine 2.5 mg po qHS and Carvedilol 50 mg po BID; also continue with Lasix 40 mg p.o. twice daily  CAD s/p DES 2014 -Status post left heart cath 09/01/2019 and showed "Multivessel coronary artery disease with 50-60% mid LAD  stenosis and chronic total occlusion of the distal LAD, as well as 70% D2 lesion and sequential 30-40% proximal and 50-60% distal RCA stenoses. Ostial/proximal LAD stent is widely patent. Mildly to moderately left ventricular filling pressure (LVEDP ~24 mmHg)."  -Continue dual antiplatelets with ASA 81 mg po Daily, Clopidogrel 75 mg po Daily, Carvedilol 50 mg po BID and Rosuvastatin 20 mg po daily  -Check HS Troponin and EKG given CP -May Discuss with Cardiology and get formal consultation after EKG and Troponins are checked; Troponin Negative but EKG never done. So will order again   Obesity -Estimated body mass index is 38.68 kg/m as calculated from the following:   Height as of this encounter: 6\' 1"  (1.854 m).   Weight as of this encounter: 133 kg. -Weight Loss and Dietary Counseling given   Elevated ALT  -In the setting of COVID-19 disease  -Patient's ALT is has been elevated since January 6 the least and has trended up to 76 and currently is now trending back down to 66 but had a slight bump 74 -Continue to monitor and trend and repeat CMP in a.m.  Hypokalemia -Patient's potassium this morning was 3.4  -Replete with p.o. potassium chloride 40 mg twice daily x2 doses again -Continue to monitor and replete as necessary -Repeat CMP in a.m.  Normocytic Anemia -Patient's hemoglobin/hematocrit is now stable at 13.0/31.2 despite him still having some nosebleeding intermittently -Has been relatively stable last week or so -Checked Anemia Panel and showed an iron level of 114, U IBC of 298, TIBC of 412, saturation ratios of 20%, ferritin level 128, folate level 15.7, vitamin B12 level 493 -Continue to monitor for signs or symptoms of bleeding; currently no overt bleeding noted but has had nosebleeds throughout the week -Repeat CBC in a.m.  DVT prophylaxis: Enoxaparin 65 mg sq q24h Code Status: FULL CODE  Family Communication: No family present at bedside  Disposition Plan: SNF whenever  patient receives insurance authorization he is medically stable to be discharged at this time  Consultants:   PCCM/Pulmonary  Discussed with Cardiology Dr. Gwenlyn Found    Procedures:  ECHOCARDIOGRAM IMPRESSIONS    1. Technically difficult study with limited views even after administration of contrast. Left ventricular ejection fraction, by visual estimation, is 60 to 65%. The left ventricle has normal function. There is mildly increased left ventricular wall  thickness.  2. Right ventricle was not well visualized. On limited views appears mildly dilated with grossly normal systolic function  3. The mitral valve is normal in structure. No evidence of mitral valve regurgitation.  4. The tricuspid valve was grossly normal. Tricuspid valve regurgitation is not demonstrated.  5. The aortic valve was not well visualized. Aortic valve regurgitation is not visualized.  6. TR signal is inadequate for assessing pulmonary artery systolic pressure.  FINDINGS  Left Ventricle: Left ventricular ejection fraction, by visual estimation, is 60 to 65%. The left ventricle has normal function. There is mildly increased left ventricular wall thickness.  Right  Ventricle: The right ventricular size is NWV. Right vetricular wall thickness was not assessed. Global RV systolic function is was not well visualized.  Left Atrium: Left atrial size was not well visualized.  Right Atrium: Right atrial size was not well visualized. Right atrial pressure is estimated at 3 mmHg.  Pericardium: Trivial pericardial effusion is present is seen. Trivial pericardial effusion is present.  Mitral Valve: The mitral valve is normal in structure. No evidence of mitral valve regurgitation.  Tricuspid Valve: The tricuspid valve is grossly normal. Tricuspid valve regurgitation is not demonstrated.  Aortic Valve: The aortic valve was not well visualized. Aortic valve regurgitation is not visualized.  Pulmonic Valve: The  pulmonic valve was not well visualized. Pulmonic valve regurgitation is not visualized by color flow Doppler. Pulmonic regurgitation is not visualized by color flow Doppler.  Aorta: The aortic root is normal in size and structure.  Shunts: The interatrial septum was not well visualized.    LEFT VENTRICLE          Normals PLAX 2D LVIDd:         5.20 cm  3.6 cm LVIDs:         3.00 cm  1.7 cm LV PW:         1.20 cm  1.4 cm LV IVS:        1.10 cm  1.3 cm LVOT diam:     2.10 cm  2.0 cm LV SV:         95 ml    79 ml LV SV Index:   34.99    45 ml/m2 LVOT Area:     3.46 cm 3.14 cm2    LEFT ATRIUM         Index LA diam:    3.60 cm 1.41 cm/m    AORTA                 Normals Ao Root diam: 3.70 cm 31 mm    SHUNTS Systemic Diam: 2.10 cm  Antimicrobials:  Anti-infectives (From admission, onward)   Start     Dose/Rate Route Frequency Ordered Stop   11/01/19 1630  piperacillin-tazobactam (ZOSYN) IVPB 3.375 g  Status:  Discontinued     3.375 g 12.5 mL/hr over 240 Minutes Intravenous Every 8 hours 11/01/19 1609 11/03/19 1244   10/31/19 1000  remdesivir 100 mg in sodium chloride 0.9 % 100 mL IVPB  Status:  Discontinued     100 mg 200 mL/hr over 30 Minutes Intravenous Daily 10/30/19 0058 10/30/19 0154   10/31/19 1000  remdesivir 100 mg in sodium chloride 0.9 % 100 mL IVPB     100 mg 200 mL/hr over 30 Minutes Intravenous Every 24 hours 10/30/19 0837 11/03/19 1057   10/30/19 2200  remdesivir 100 mg in sodium chloride 0.9 % 100 mL IVPB  Status:  Discontinued     100 mg 200 mL/hr over 30 Minutes Intravenous Daily 10/29/19 2257 10/30/19 0835   10/30/19 0200  azithromycin (ZITHROMAX) 500 mg in sodium chloride 0.9 % 250 mL IVPB  Status:  Discontinued     500 mg 250 mL/hr over 60 Minutes Intravenous Every 24 hours 10/30/19 0058 10/31/19 1643   10/30/19 0100  remdesivir 200 mg in sodium chloride 0.9% 250 mL IVPB  Status:  Discontinued     200 mg 580 mL/hr over 30 Minutes Intravenous Once  10/30/19 0058 10/30/19 0404   10/29/19 2300  remdesivir 200 mg in sodium chloride 0.9% 250 mL IVPB  Status:  Discontinued     200 mg 580 mL/hr over 30 Minutes Intravenous Once 10/29/19 2257 10/30/19 0835     Subjective: Patient seen and examined at bedside and he states that he had a rough night and states that his nose started bleeding significantly again.  No nausea or vomiting.  Patient's blood pressure did drop yesterday evening and he became hypoglycemic and was symptomatic.  No chest pain, lightheadedness or dizziness now.  Still anxious about going to SNF but happy that his inflammatory markers are improved.  No other concerns or complaints at this time.  Objective: Vitals:   11/10/19 0447 11/10/19 0535 11/10/19 0800 11/10/19 1100  BP:   113/67 110/70  Pulse:  (!) 53 (!) 58 62  Resp:  13    Temp: 97.6 F (36.4 C)   98.2 F (36.8 C)  TempSrc: Axillary   Axillary  SpO2:  (!) 89%  93%  Weight:  133 kg    Height:        Intake/Output Summary (Last 24 hours) at 11/10/2019 1542 Last data filed at 11/10/2019 1514 Gross per 24 hour  Intake 1100 ml  Output 2500 ml  Net -1400 ml   Filed Weights   11/08/19 0534 11/09/19 0446 11/10/19 0535  Weight: 132 kg 133.3 kg 133 kg   Examination: Physical Exam:  Constitutional: WN/WD obese Caucasian male currently in no acute distress and was napping when I walked in and does not appear as anxious today.  Eyes: Lids and conjunctivae normal, sclerae anicteric  ENMT: External Ears, Nose appear normal. Grossly normal hearing. Mucous membranes are moist.  Neck: Appears normal, supple, no cervical masses, normal ROM, no appreciable thyromegaly; no JVD Respiratory: Diminished to auscultation bilaterally with coarse breath sounds, no wheezing, rales, rhonchi or crackles. Normal respiratory effort and patient is not tachypenic. No accessory muscle use.  Not wearing any supplemental oxygen via nasal cannula and his saturations were in the low  90s Cardiovascular: RRR, no murmurs / rubs / gallops. S1 and S2 auscultated.  1+ extremity edema. Abdomen: Soft, non-tender, Distended 2/2 body habitus. Bowel sounds positive x4.  GU: Deferred. Musculoskeletal: No clubbing / cyanosis of digits/nails. No joint deformity upper and lower extremities.  Skin: No rashes, lesions, ulcers on limited skin evaluation with lower extremity venous stasis changes. No induration; Warm and dry.  Neurologic: CN 2-12 grossly intact with no focal deficits. Romberg sign and cerebellar reflexes not assessed.  Psychiatric: Normal judgment and insight. Alert and oriented x 3. Not as anxious today.  appropriate affect.   Data Reviewed: I have personally reviewed following labs and imaging studies  CBC: Recent Labs  Lab 11/06/19 0224 11/07/19 0205 11/08/19 0518 11/09/19 0311 11/10/19 0250  WBC 5.8 6.3 7.5 7.8 6.8  NEUTROABS 4.2 4.6 5.9 5.9 4.7  HGB 13.2 12.2* 12.7* 12.4* 13.0  HCT 41.6 37.5* 39.4 37.8* 39.2  MCV 92.7 91.7 92.3 91.3 91.4  PLT 176 171 164 170 [id number]   Basic Metabolic Panel: Recent Labs  Lab 11/06/19 0224 11/07/19 0205 11/08/19 0518 11/09/19 0311 11/10/19 0250  NA 137 137 139 140 142  K 3.8 4.0 3.9 3.3* 3.4*  CL 92* 94* 94* 94* 99  CO2 35* 31 35* 34* 33*  GLUCOSE 233* 183* 140* 75 29*  BUN 51* 57* 50* 53* 50*  CREATININE 1.54* 1.40* 1.37* 1.49* 1.40*  CALCIUM 9.0 9.2 9.2 9.3 9.3  MG 1.8 1.7 1.8 1.8 1.8  PHOS 3.3 3.5 3.9 4.4 4.2   GFR: Estimated Creatinine  Clearance: 73.2 mL/min (A) (by C-G formula based on SCr of 1.4 mg/dL (H)). Liver Function Tests: Recent Labs  Lab 11/06/19 0224 11/07/19 0205 11/08/19 0518 11/09/19 0311 11/10/19 0250  AST 30 27 32 32 37  ALT 70* 62* 70* 66* 74*  ALKPHOS 56 51 49 48 43  BILITOT 0.6 0.8 0.8 0.9 0.7  PROT 5.4* 5.3* 5.5* 5.3* 5.4*  ALBUMIN 2.7* 2.6* 2.8* 2.8* 2.9*   No results for input(s): LIPASE, AMYLASE in the last 168 hours. No results for input(s): AMMONIA in the last 168  hours. Coagulation Profile: No results for input(s): INR, PROTIME in the last 168 hours. Cardiac Enzymes: No results for input(s): CKTOTAL, CKMB, CKMBINDEX, TROPONINI in the last 168 hours. BNP (last 3 results) No results for input(s): PROBNP in the last 8760 hours. HbA1C: No results for input(s): HGBA1C in the last 72 hours. CBG: Recent Labs  Lab 11/09/19 2343 11/10/19 0411 11/10/19 0419 11/10/19 0730 11/10/19 1153  GLUCAP 244* 31* 165* 99 148*   Lipid Profile: No results for input(s): CHOL, HDL, LDLCALC, TRIG, CHOLHDL, LDLDIRECT in the last 72 hours. Thyroid Function Tests: No results for input(s): TSH, T4TOTAL, FREET4, T3FREE, THYROIDAB in the last 72 hours. Anemia Panel: Recent Labs    11/09/19 0311 11/10/19 0250  VITAMINB12  --  493  FOLATE  --  18.7  FERRITIN 134 128  TIBC  --  412  IRON  --  114  RETICCTPCT  --  3.3*   Sepsis Labs: No results for input(s): PROCALCITON, LATICACIDVEN in the last 168 hours.  Recent Results (from the past 240 hour(s))  Culture, blood (routine x 2)     Status: None   Collection Time: 11/01/19  7:00 PM   Specimen: BLOOD RIGHT HAND  Result Value Ref Range Status   Specimen Description BLOOD RIGHT HAND  Final   Special Requests   Final    BOTTLES DRAWN AEROBIC ONLY Blood Culture adequate volume   Culture   Final    NO GROWTH 5 DAYS Performed at [facility], [address], [place], [place] [address]    Report Status 11/06/2019 FINAL  Final  Culture, blood (routine x 2)     Status: None   Collection Time: 11/01/19  7:10 PM   Specimen: BLOOD RIGHT HAND  Result Value Ref Range Status   Specimen Description BLOOD RIGHT HAND  Final   Special Requests   Final    BOTTLES DRAWN AEROBIC ONLY Blood Culture adequate volume   Culture   Final    NO GROWTH 5 DAYS Performed at [facility], [place] [address] [place], [place] [address]    Report Status 11/06/2019 FINAL  Final    Radiology Studies: No results  found.  Scheduled Meds: . amLODipine  2.5 mg Oral QHS  . vitamin C  500 mg Oral Daily  . aspirin  81 mg Oral QHS  . carvedilol  50 mg Oral BID  . clopidogrel  75 mg Oral Daily  . enoxaparin (LOVENOX) injection  0.5 mg/kg Subcutaneous Q24H  . famotidine  20 mg Oral QHS  . fluticasone  2 spray Each Nare TID  . folic acid  1 mg Oral Daily  . furosemide  40 mg Oral BID  . icosapent Ethyl  2 g Oral BID  . insulin aspart  0-9 Units Subcutaneous TID WC  . insulin aspart  7 Units Subcutaneous TID WC  . insulin detemir  20 Units Subcutaneous BID  .  multivitamin with minerals  1 tablet Oral Daily  . oxymetazoline  1 spray Each Nare BID  . potassium chloride  40 mEq Oral BID  . pregabalin  50 mg Oral BID  . QUEtiapine  200 mg Oral QHS  . rosuvastatin  20 mg Oral Daily  . sodium chloride flush  3 mL Intravenous Q12H  . Vitamin D (Ergocalciferol)  50,000 Units Oral Q Mon  . zinc sulfate  220 mg Oral Daily   Continuous Infusions: . sodium chloride      LOS: 12 days   Kerney Elbe, DO [facility] PAGER is on AMION  If 7PM-7AM, please contact night-coverage [email or web address]

## 2019-11-10 NOTE — Progress Notes (Signed)
Telephone report called nurse Constance,RN at [facility].

## 2019-11-10 NOTE — TOC Progression Note (Signed)
Transition of Care [facility]) - Progression Note    Patient Details  Name: Reginald Duncan MRN: [id number] Date of Birth: [birth year]  Transition of Care [facility]) CM/SW [place], LCSW Phone Number: 11/10/2019, 10:57 AM  Clinical Narrative:    Mendel Corning waiting on approval from insurance as waiver is no longer in place.    Expected Discharge Plan: [place] Barriers to Discharge: Continued Medical Work up  Expected Discharge Plan and Services Expected Discharge Plan: [place] arrangements for the past 2 months: Single Family Home Expected Discharge Date: 11/04/19                                     Social Determinants of Health (SDOH) Interventions    Readmission Risk Interventions Readmission Risk Prevention Plan 10/31/2019 09/27/2019  Transportation Screening Complete Complete  PCP or Specialist Appt within 3-5 Days - Complete  HRI or [place] - Complete  Social Work Consult for [place] Planning/Counseling - Complete  Palliative Care Screening - Not Applicable  Medication Review Press photographer) Complete Complete  PCP or Specialist appointment within 3-5 days of discharge Complete -  [place] or Home Care Consult Complete -  Some recent data might be hidden

## 2019-11-14 DIAGNOSIS — R2681 Unsteadiness on feet: Secondary | ICD-10-CM | POA: Diagnosis not present

## 2019-11-14 DIAGNOSIS — E119 Type 2 diabetes mellitus without complications: Secondary | ICD-10-CM | POA: Diagnosis not present

## 2019-11-14 DIAGNOSIS — U071 COVID-19: Secondary | ICD-10-CM | POA: Diagnosis not present

## 2019-11-14 DIAGNOSIS — E1140 Type 2 diabetes mellitus with diabetic neuropathy, unspecified: Secondary | ICD-10-CM | POA: Diagnosis not present

## 2019-11-14 DIAGNOSIS — F419 Anxiety disorder, unspecified: Secondary | ICD-10-CM | POA: Diagnosis not present

## 2019-11-14 DIAGNOSIS — N183 Chronic kidney disease, stage 3 unspecified: Secondary | ICD-10-CM | POA: Diagnosis not present

## 2019-11-14 DIAGNOSIS — M6281 Muscle weakness (generalized): Secondary | ICD-10-CM | POA: Diagnosis not present

## 2019-11-14 DIAGNOSIS — R2689 Other abnormalities of gait and mobility: Secondary | ICD-10-CM | POA: Diagnosis not present

## 2019-11-14 DIAGNOSIS — I2510 Atherosclerotic heart disease of native coronary artery without angina pectoris: Secondary | ICD-10-CM | POA: Diagnosis not present

## 2019-11-14 DIAGNOSIS — J449 Chronic obstructive pulmonary disease, unspecified: Secondary | ICD-10-CM | POA: Diagnosis not present

## 2019-11-14 DIAGNOSIS — I119 Hypertensive heart disease without heart failure: Secondary | ICD-10-CM | POA: Diagnosis not present

## 2019-11-14 DIAGNOSIS — R531 Weakness: Secondary | ICD-10-CM | POA: Diagnosis not present

## 2019-11-15 DIAGNOSIS — R2681 Unsteadiness on feet: Secondary | ICD-10-CM | POA: Diagnosis not present

## 2019-11-15 DIAGNOSIS — M6281 Muscle weakness (generalized): Secondary | ICD-10-CM | POA: Diagnosis not present

## 2019-11-15 DIAGNOSIS — R2689 Other abnormalities of gait and mobility: Secondary | ICD-10-CM | POA: Diagnosis not present

## 2019-11-19 ENCOUNTER — Other Ambulatory Visit: Payer: Self-pay

## 2019-11-19 NOTE — Patient Outreach (Signed)
[facility]) Care Management  11/19/2019  Reginald Duncan Jun 17, 1951 [id number]  Referral received as patient discharged from [facility] on 11/17/19.  Telephone call to patient. He states he is still at the facility right now with no plans for discharge yet.    Plan: RN CM will close case.  Jone Baseman, RN, MSN [place] Management Care Management Coordinator Direct Line [phone] Cell [phone] Toll Free: [phone]  Fax: [phone]

## 2019-11-21 DIAGNOSIS — I2510 Atherosclerotic heart disease of native coronary artery without angina pectoris: Secondary | ICD-10-CM | POA: Diagnosis not present

## 2019-11-21 DIAGNOSIS — J449 Chronic obstructive pulmonary disease, unspecified: Secondary | ICD-10-CM | POA: Diagnosis not present

## 2019-11-21 DIAGNOSIS — E1140 Type 2 diabetes mellitus with diabetic neuropathy, unspecified: Secondary | ICD-10-CM | POA: Diagnosis not present

## 2019-11-21 DIAGNOSIS — F419 Anxiety disorder, unspecified: Secondary | ICD-10-CM | POA: Diagnosis not present

## 2019-11-21 DIAGNOSIS — N183 Chronic kidney disease, stage 3 unspecified: Secondary | ICD-10-CM | POA: Diagnosis not present

## 2019-11-21 DIAGNOSIS — R531 Weakness: Secondary | ICD-10-CM | POA: Diagnosis not present

## 2019-11-21 DIAGNOSIS — U071 COVID-19: Secondary | ICD-10-CM | POA: Diagnosis not present

## 2019-11-21 DIAGNOSIS — E119 Type 2 diabetes mellitus without complications: Secondary | ICD-10-CM | POA: Diagnosis not present

## 2019-11-21 DIAGNOSIS — I119 Hypertensive heart disease without heart failure: Secondary | ICD-10-CM | POA: Diagnosis not present

## 2019-11-24 DIAGNOSIS — J449 Chronic obstructive pulmonary disease, unspecified: Secondary | ICD-10-CM | POA: Diagnosis not present

## 2019-11-24 DIAGNOSIS — N183 Chronic kidney disease, stage 3 unspecified: Secondary | ICD-10-CM | POA: Diagnosis not present

## 2019-11-24 DIAGNOSIS — U071 COVID-19: Secondary | ICD-10-CM | POA: Diagnosis not present

## 2019-11-24 DIAGNOSIS — E119 Type 2 diabetes mellitus without complications: Secondary | ICD-10-CM | POA: Diagnosis not present

## 2019-11-24 DIAGNOSIS — E1140 Type 2 diabetes mellitus with diabetic neuropathy, unspecified: Secondary | ICD-10-CM | POA: Diagnosis not present

## 2019-11-24 DIAGNOSIS — I119 Hypertensive heart disease without heart failure: Secondary | ICD-10-CM | POA: Diagnosis not present

## 2019-11-24 DIAGNOSIS — F419 Anxiety disorder, unspecified: Secondary | ICD-10-CM | POA: Diagnosis not present

## 2019-11-24 DIAGNOSIS — I2510 Atherosclerotic heart disease of native coronary artery without angina pectoris: Secondary | ICD-10-CM | POA: Diagnosis not present

## 2019-11-24 DIAGNOSIS — R531 Weakness: Secondary | ICD-10-CM | POA: Diagnosis not present

## 2019-11-25 DIAGNOSIS — J449 Chronic obstructive pulmonary disease, unspecified: Secondary | ICD-10-CM | POA: Diagnosis not present

## 2019-11-26 DIAGNOSIS — Z20822 Contact with and (suspected) exposure to covid-19: Secondary | ICD-10-CM | POA: Diagnosis not present

## 2019-11-27 DIAGNOSIS — E1140 Type 2 diabetes mellitus with diabetic neuropathy, unspecified: Secondary | ICD-10-CM | POA: Diagnosis not present

## 2019-11-27 DIAGNOSIS — I5030 Unspecified diastolic (congestive) heart failure: Secondary | ICD-10-CM | POA: Diagnosis not present

## 2019-11-27 DIAGNOSIS — J9621 Acute and chronic respiratory failure with hypoxia: Secondary | ICD-10-CM | POA: Diagnosis not present

## 2019-11-27 DIAGNOSIS — R5381 Other malaise: Secondary | ICD-10-CM | POA: Diagnosis not present

## 2019-11-27 DIAGNOSIS — F419 Anxiety disorder, unspecified: Secondary | ICD-10-CM | POA: Diagnosis not present

## 2019-11-27 DIAGNOSIS — F329 Major depressive disorder, single episode, unspecified: Secondary | ICD-10-CM | POA: Diagnosis not present

## 2019-11-27 DIAGNOSIS — E1165 Type 2 diabetes mellitus with hyperglycemia: Secondary | ICD-10-CM | POA: Diagnosis not present

## 2019-11-27 DIAGNOSIS — J441 Chronic obstructive pulmonary disease with (acute) exacerbation: Secondary | ICD-10-CM | POA: Diagnosis not present

## 2019-11-27 DIAGNOSIS — J45998 Other asthma: Secondary | ICD-10-CM | POA: Diagnosis not present

## 2019-11-27 DIAGNOSIS — Y708 Miscellaneous anesthesiology devices associated with adverse incidents, not elsewhere classified: Secondary | ICD-10-CM | POA: Diagnosis not present

## 2019-11-27 DIAGNOSIS — I2510 Atherosclerotic heart disease of native coronary artery without angina pectoris: Secondary | ICD-10-CM | POA: Diagnosis not present

## 2019-11-28 DIAGNOSIS — Z79899 Other long term (current) drug therapy: Secondary | ICD-10-CM | POA: Diagnosis not present

## 2019-11-28 DIAGNOSIS — M1710 Unilateral primary osteoarthritis, unspecified knee: Secondary | ICD-10-CM | POA: Diagnosis not present

## 2019-11-28 DIAGNOSIS — G894 Chronic pain syndrome: Secondary | ICD-10-CM | POA: Diagnosis not present

## 2019-12-02 ENCOUNTER — Ambulatory Visit: Payer: Medicare HMO | Admitting: Physician Assistant
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
## 2019-12-22 DEATH — deceased

## 2020-07-21 IMAGING — DX DG CHEST 1V PORT
1 series · 1 of 1 positions shown · non-contrast
Comparison: Marielis Lorenz chest x-ray 09/11/2019

CLINICAL DATA: Shortness of breath.

EXAM:
PORTABLE CHEST 1 VIEW

[chest ap]
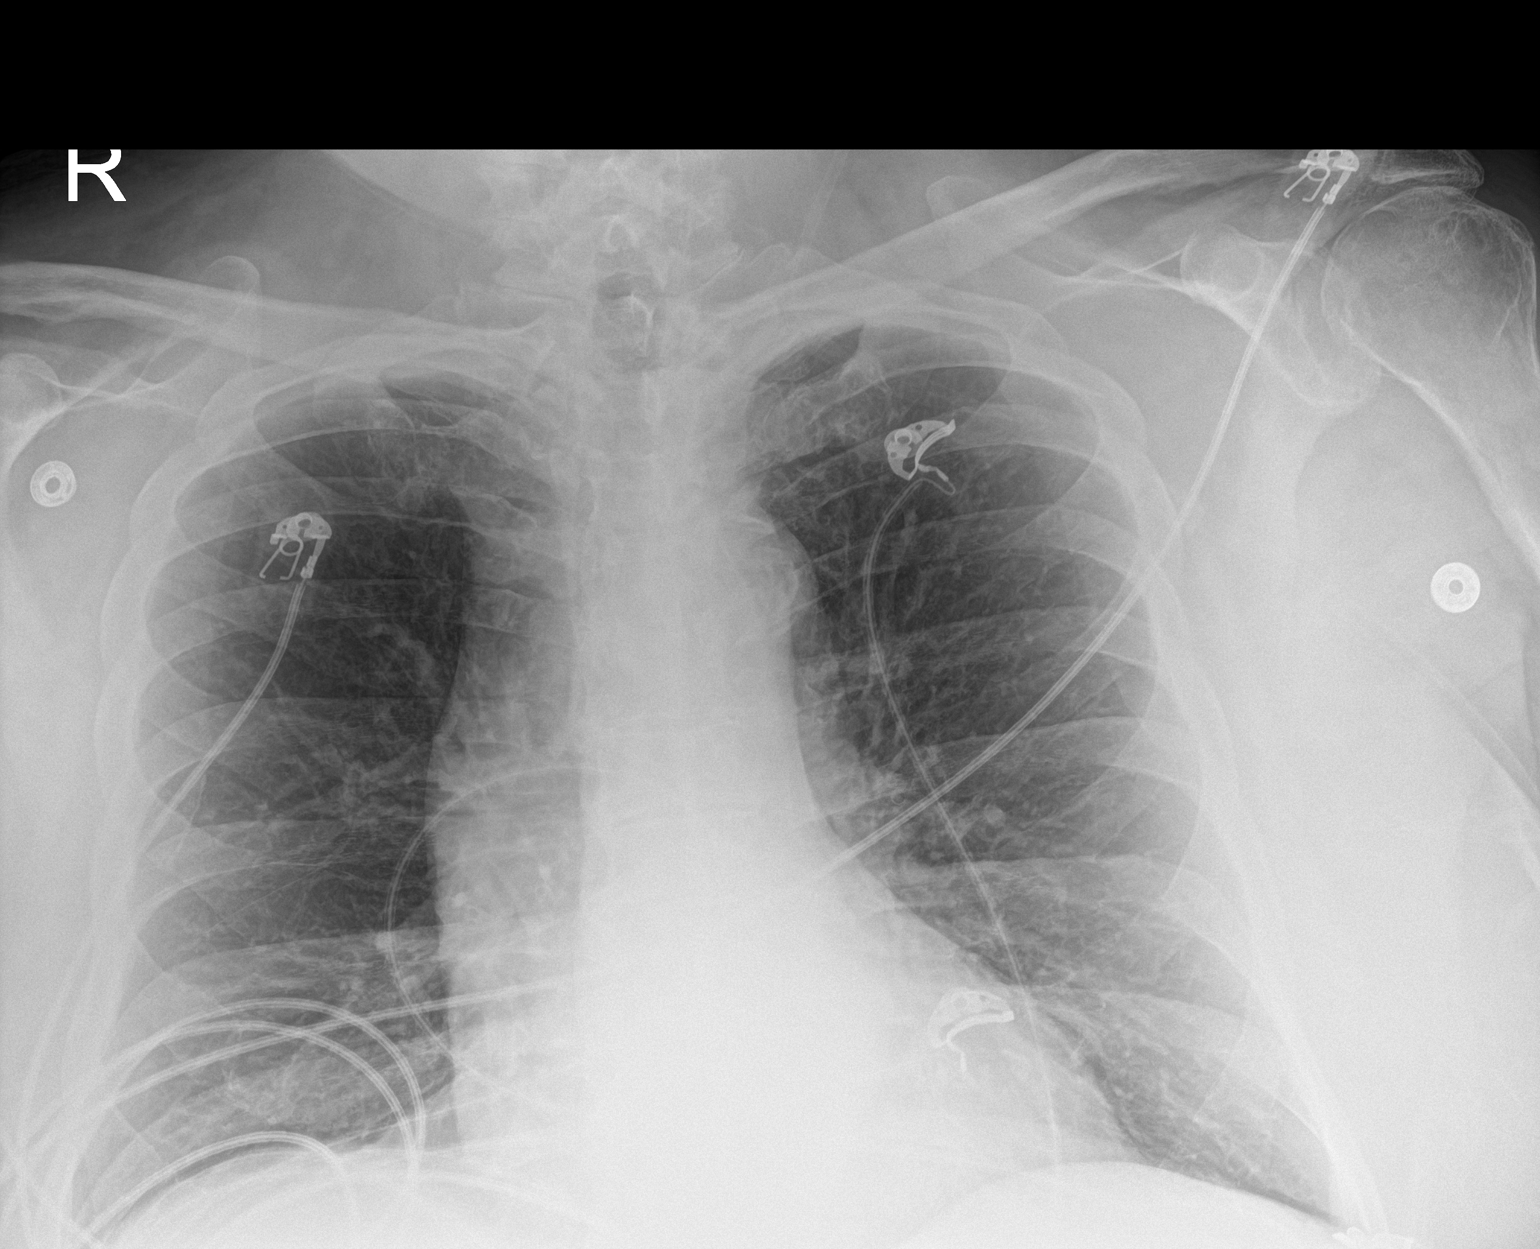

[1 of 1 positions shown; findings below may reference images not displayed]

FINDINGS: 0079 hours. Lordotic film. The lungs are clear without focal
pneumonia, edema, pneumothorax or pleural effusion.
Cardiopericardial silhouette is at upper limits of normal for size.
The visualized bony structures of the thorax are intact. Telemetry
leads overlie the chest.
IMPRESSION: Stable.  No active disease.

## 2020-07-23 IMAGING — DX DG CHEST 1V PORT
1 series · 1 of 1 positions shown · non-contrast
Comparison: 09/13/2019

CLINICAL DATA: Pneumonia.

EXAM:
PORTABLE CHEST 1 VIEW

[chest ap]
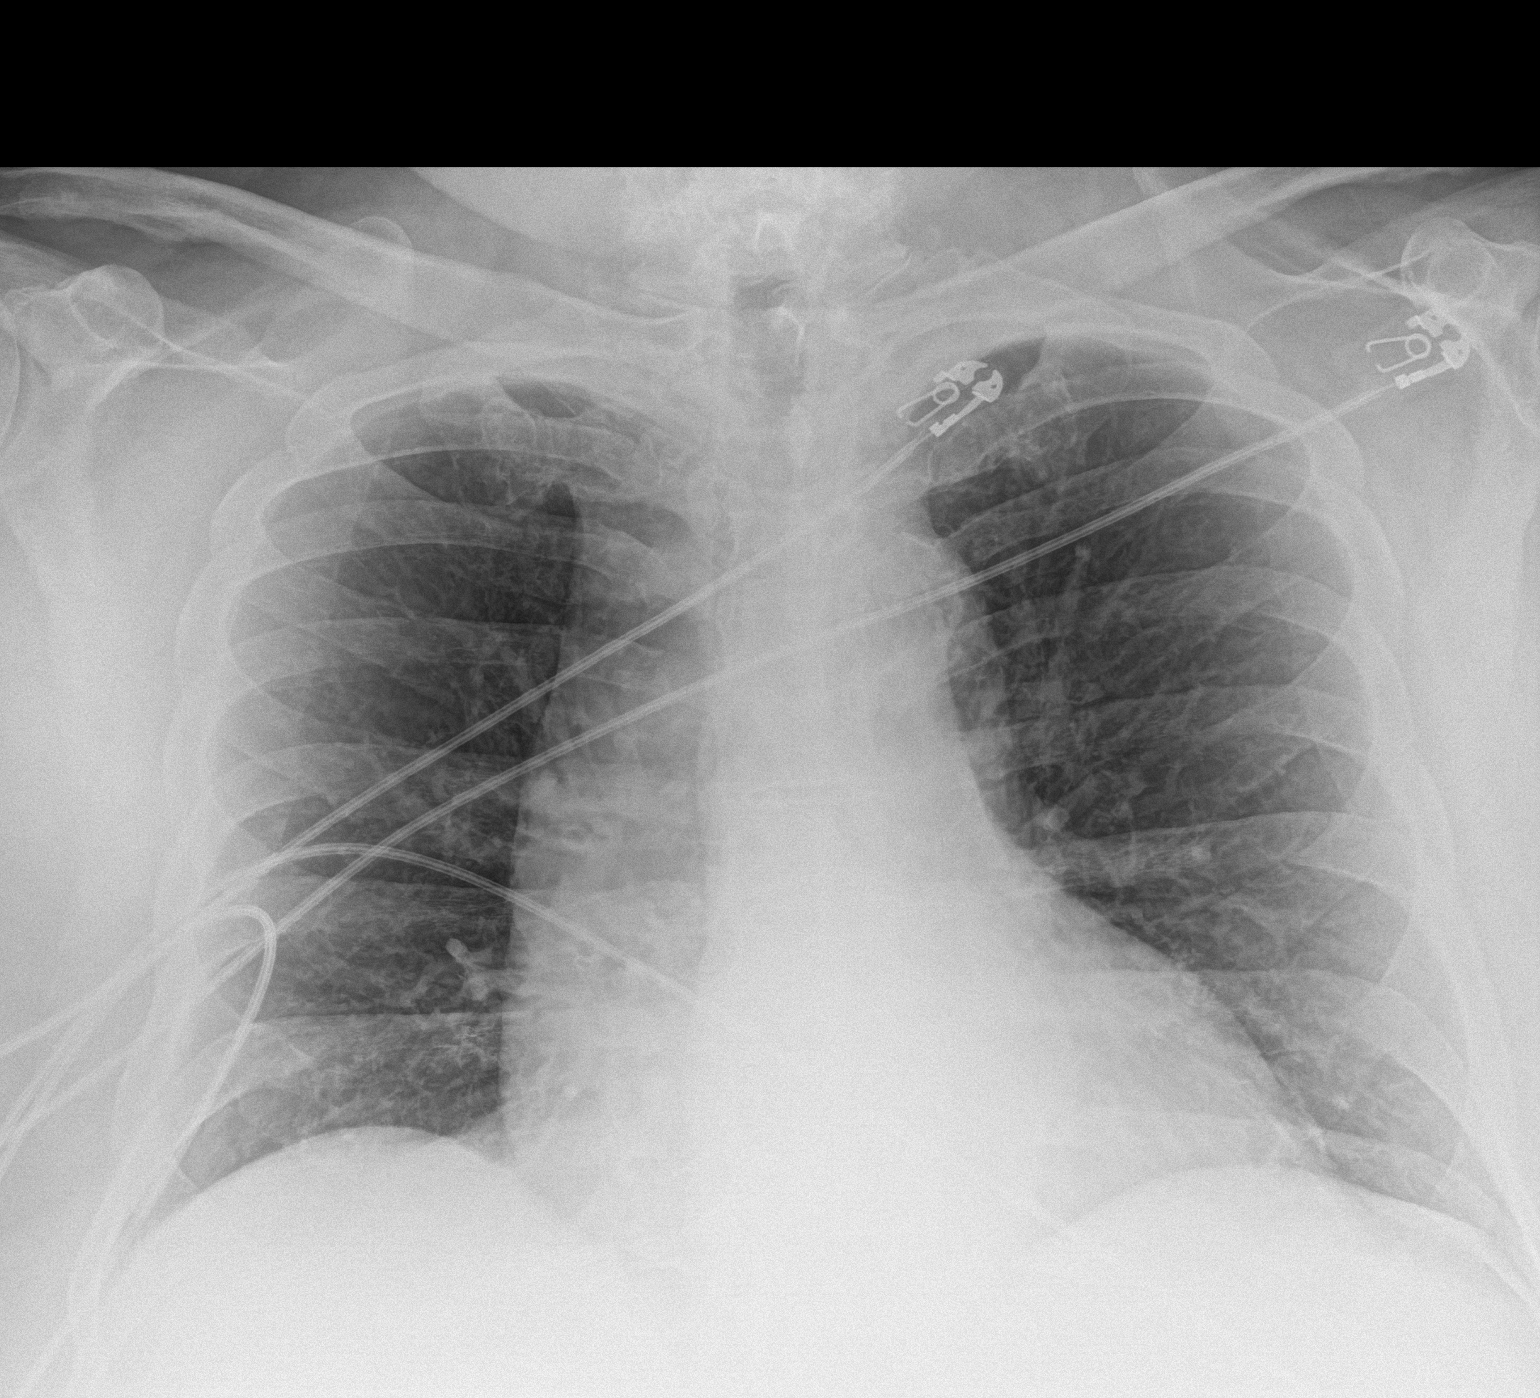

[1 of 1 positions shown; findings below may reference images not displayed]

FINDINGS: Both lungs are clear. Heart size is upper limits of normal but may
be accentuated by the AP portable technique. Trachea is midline.
Atherosclerotic calcifications at the aortic arch. Negative for a
pneumothorax. Bone structures are unremarkable. Degenerative changes
in cervical spine.
IMPRESSION: 1. No acute chest findings.
2. Aortic atherosclerosis.
3. No focal airspace disease in the lungs.

## 2020-08-16 IMAGING — CR DG CHEST 2V
2 series · 2 of 2 positions shown · non-contrast
Comparison: Chest x-ray 09/24/2019.

CLINICAL DATA: Shortness of breath.

EXAM:
CHEST - 2 VIEW

[chest pa]
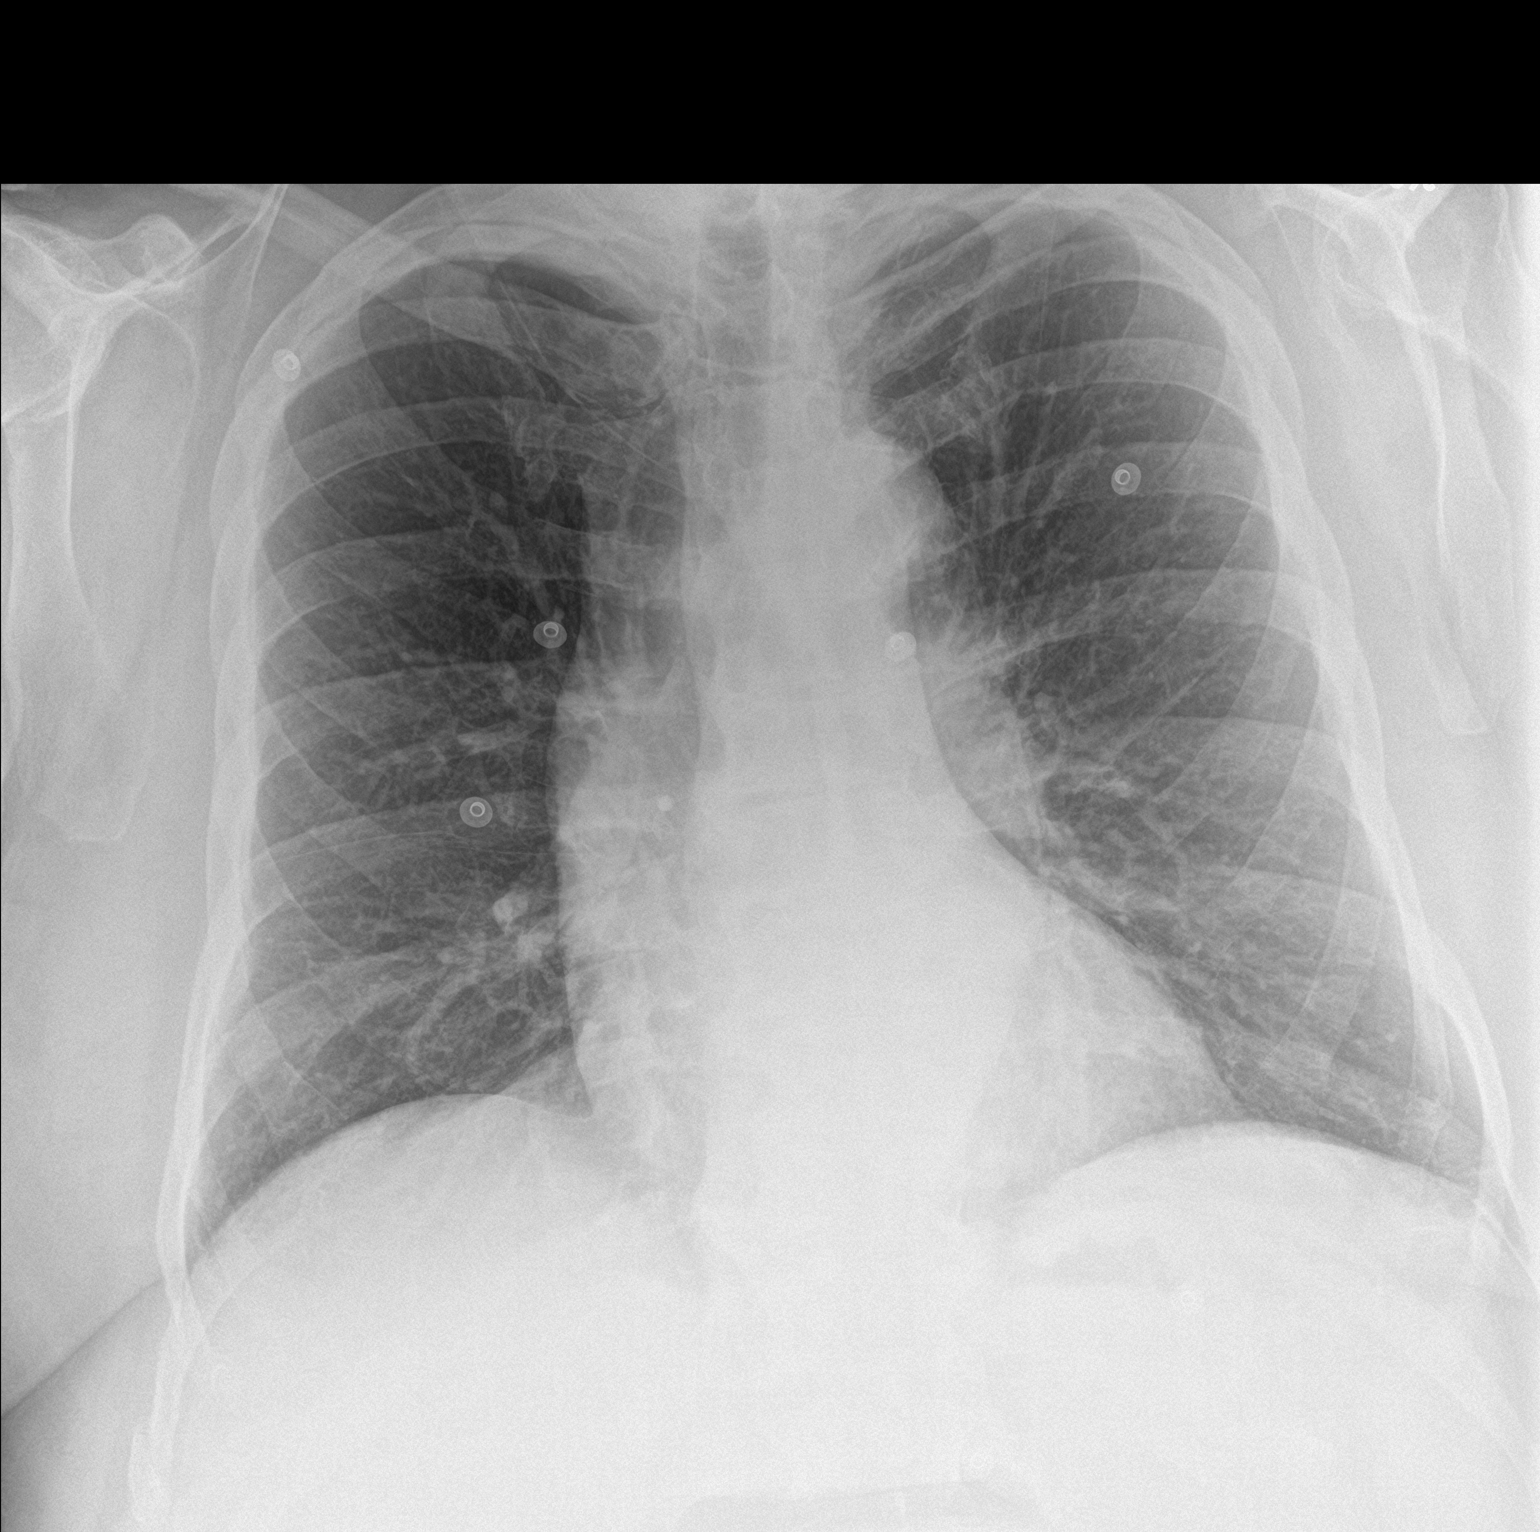

[chest lat]
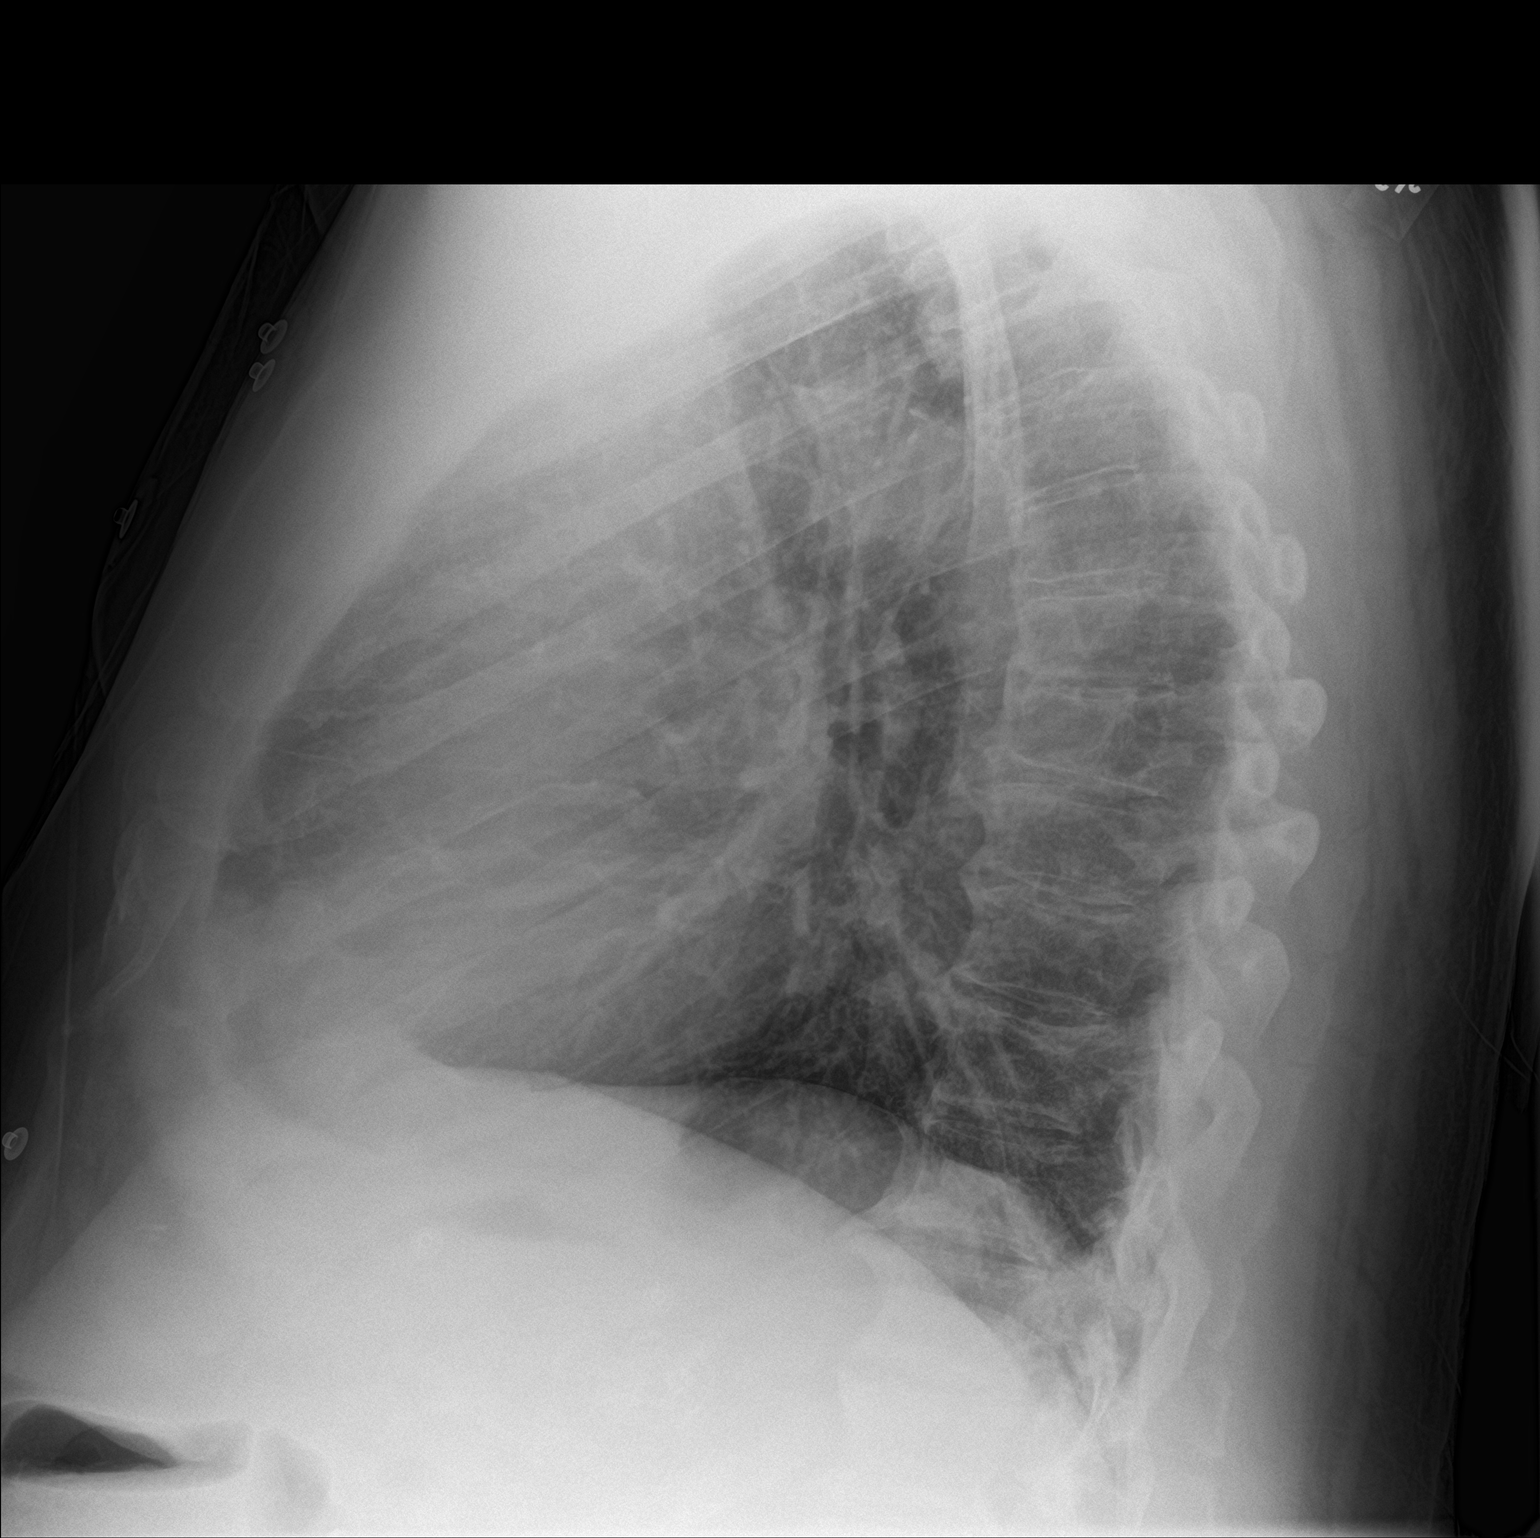

[2 of 2 positions shown; findings below may reference images not displayed]

FINDINGS: Mediastinum and hilar structures normal. Heart size normal. Mild
lingular subsegmental atelectasis. No pleural effusion or
pneumothorax. Degenerative change thoracic spine.
IMPRESSION: Mild lingular subsegmental atelectasis, otherwise negative exam.

## 2020-09-07 IMAGING — CT CT ANGIO CHEST
2 of 6 series · 17 of 46 positions shown · IV contrast (omnipaque)
Comparison: Chest CT dated 09/13/2019.

CLINICAL DATA: 68-year-old male with shortness of breath. Positive
[id number]-[id number].

EXAM:
CT ANGIOGRAPHY CHEST WITH CONTRAST
TECHNIQUE: Multidetector CT imaging of the chest was performed using the
standard protocol during bolus administration of intravenous
contrast. Multiplanar CT image reconstructions and MIPs were
obtained to evaluate the vascular anatomy.
CONTRAST:  75mL OMNIPAQUE IOHEXOL 350 MG/ML SOLN

[Series 6: thins · axial · 0.79mm/px · z∈[-259,-18]mm · 14 of 265 slices shown]
[im 12/265  lung]
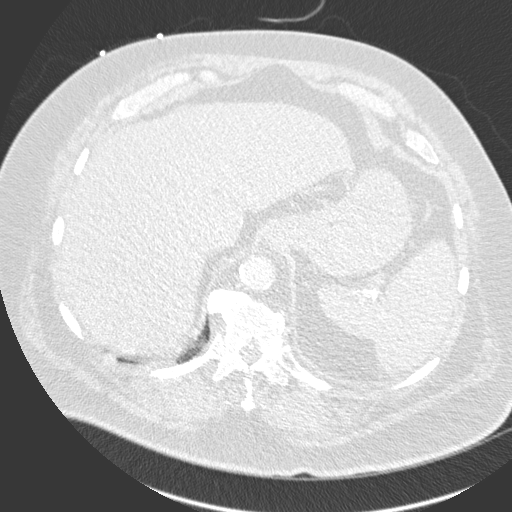
[im 35/265  soft-tissue]
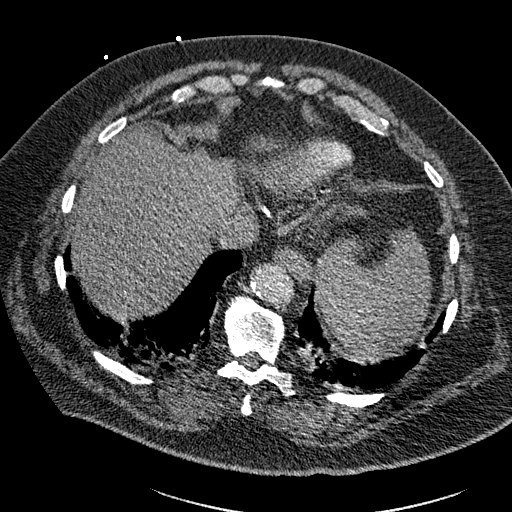
[im 46/265  lung]
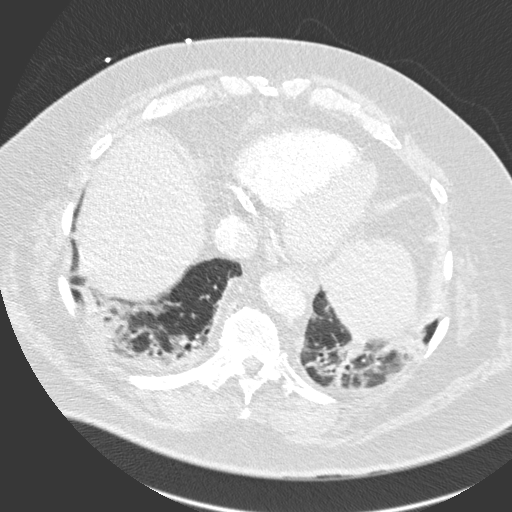
[im 69/265  soft-tissue]
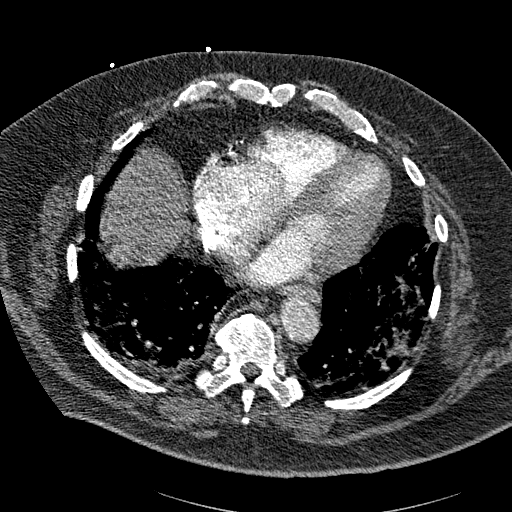
[im 92/265  lung]
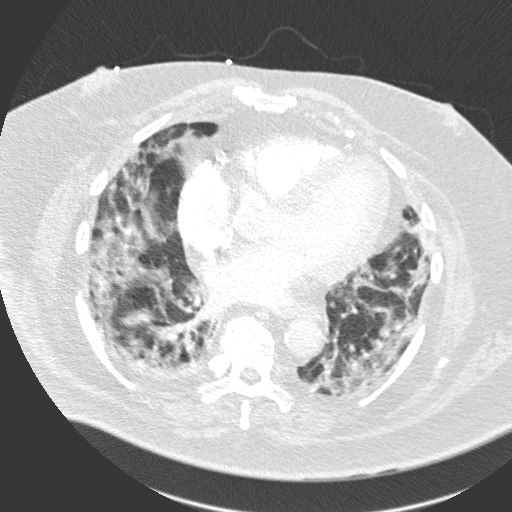
[im 104/265  soft-tissue]
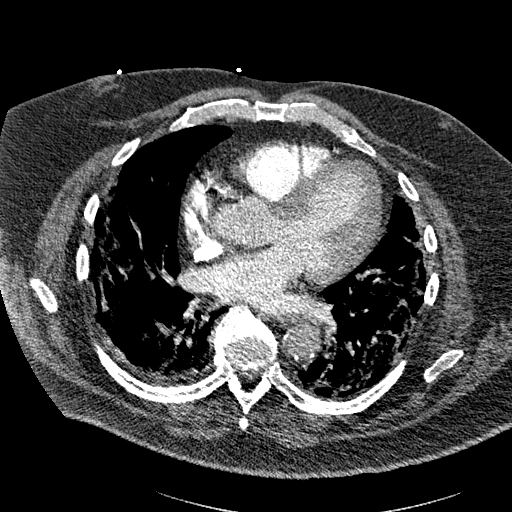
[im 127/265  lung]
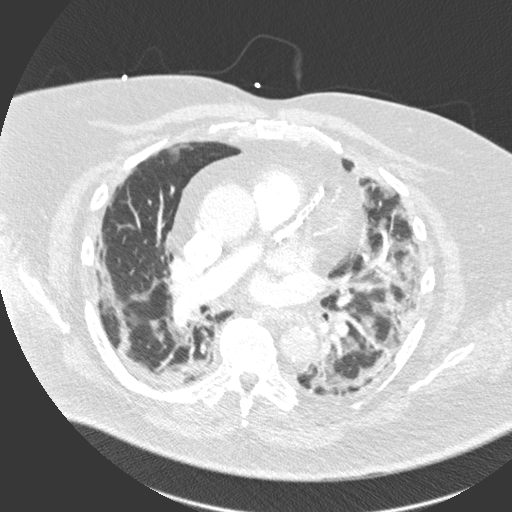
[im 138/265  soft-tissue]
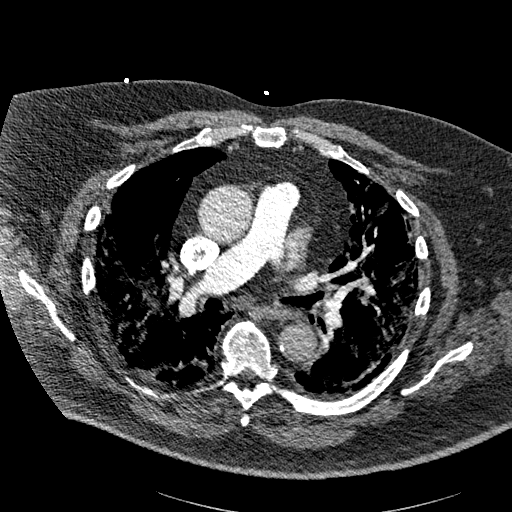
[im 161/265  lung]
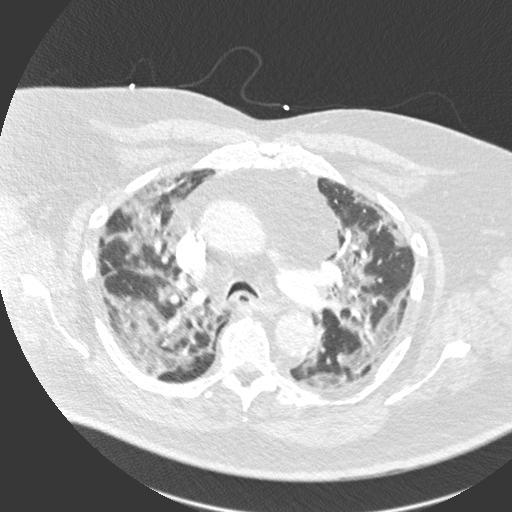
[im 173/265  soft-tissue]
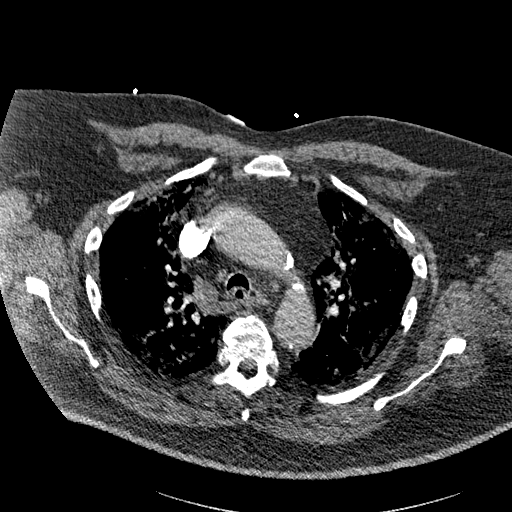
[im 196/265  lung]
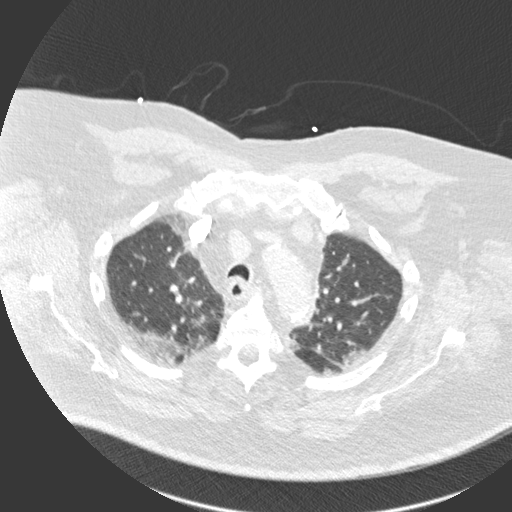
[im 219/265  soft-tissue]
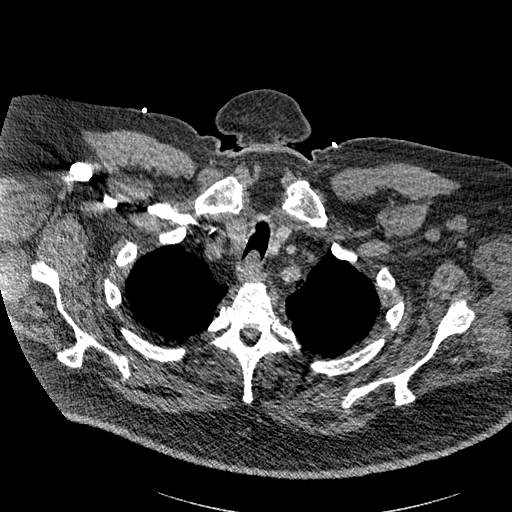
[im 230/265  lung]
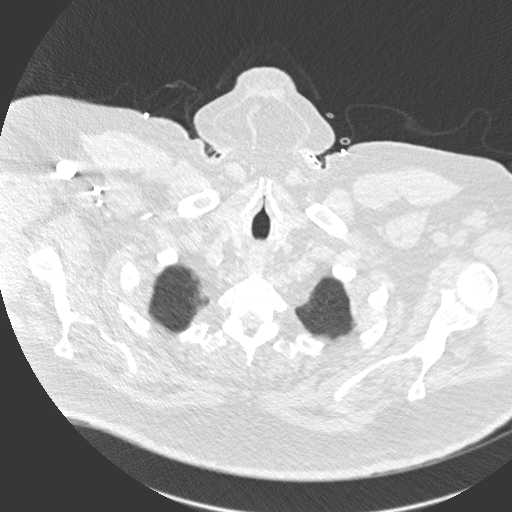
[im 253/265  soft-tissue]
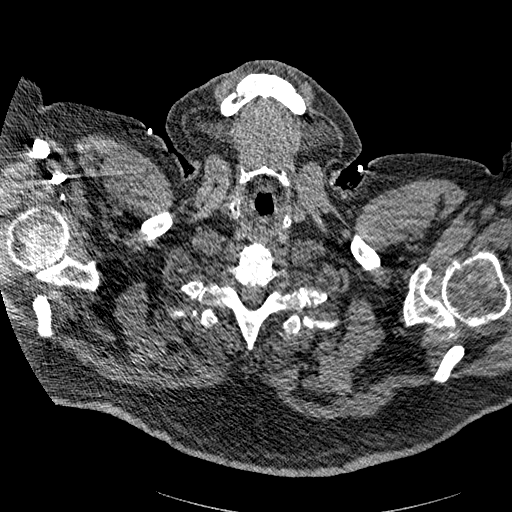

[Series 8: coronal mpr · coronal · 0.51mm/px · 3 of 181 slices shown]
[im 46/181  soft-tissue]
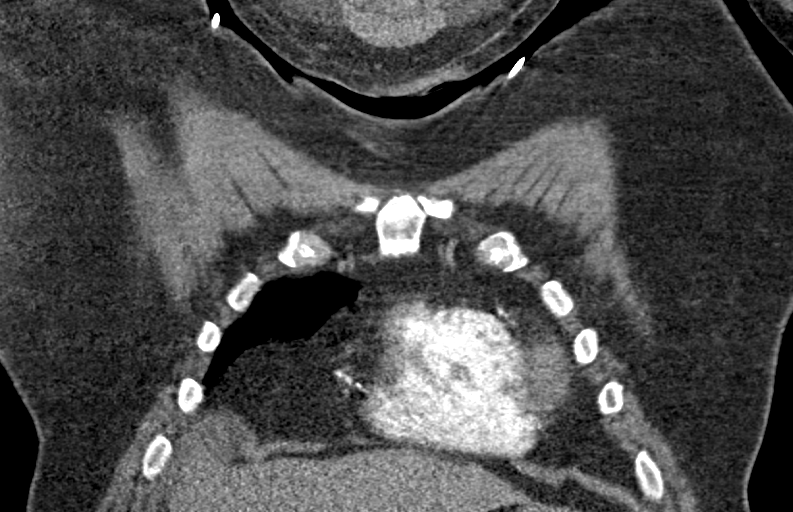
[im 91/181  soft-tissue]
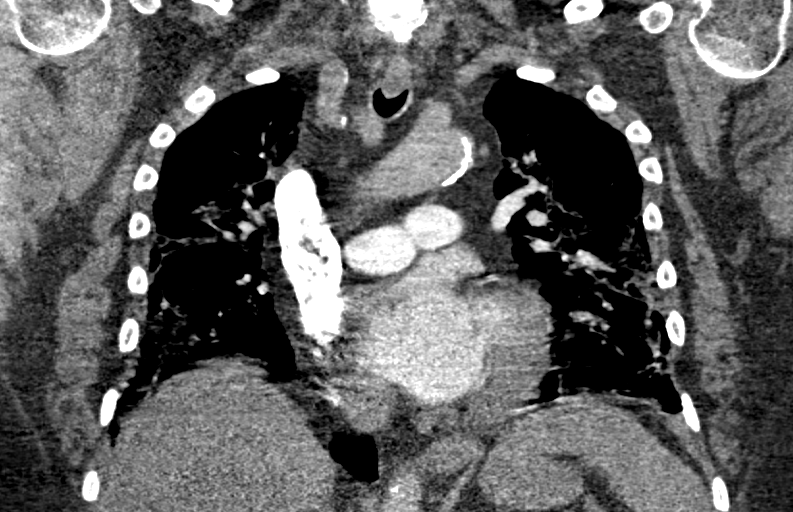
[im 136/181  soft-tissue]
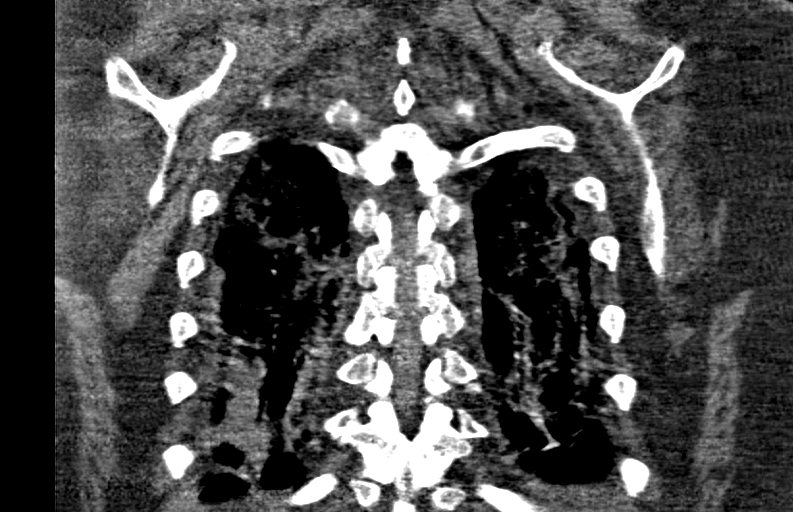

[17 of 46 positions shown; findings below may reference images not displayed]

FINDINGS: Cardiovascular: There is mild cardiomegaly. Three-vessel coronary
vascular calcification. No pericardial effusion. There is moderate
atherosclerotic calcification of the thoracic aorta. No aneurysmal
dilatation. Evaluation of the pulmonary arteries is limited due to
respiratory motion artifact and suboptimal visualization and
opacification of the peripheral branches. No large central pulmonary
artery embolus identified.

Mediastinum/Nodes: There is no hilar or mediastinal adenopathy. The
esophagus is grossly unremarkable. No mediastinal fluid collection.
There is moderate mediastinal lipomatosis.

Lungs/Pleura: Bilateral patchy and streaky airspace densities most
consistent with multifocal pneumonia, likely viral or atypical in
etiology. Clinical correlation is recommended. There is no pleural
effusion or pneumothorax. The central airways are patent.

Upper Abdomen: No acute abnormality.

Musculoskeletal: Degenerative changes of the spine. No acute osseous
pathology.

Review of the MIP images confirms the above findings.
IMPRESSION: 1. No CT evidence of central pulmonary artery embolus.
2. Multifocal pneumonia, likely viral or atypical in etiology.
Clinical correlation is recommended.
3. Cardiomegaly with 3 vessel coronary vascular calcification
4. Aortic Atherosclerosis ([id number]).

## 2020-12-21 DEATH — deceased
